# Patient Record
Sex: Male | Born: 1935
Health system: Southern US, Community
[De-identification: ages and names within clinical notes are randomized; demographics above are authoritative.]

## PROBLEM LIST (undated history)

## (undated) DIAGNOSIS — I499 Cardiac arrhythmia, unspecified: Secondary | ICD-10-CM

## (undated) DIAGNOSIS — G4700 Insomnia, unspecified: Secondary | ICD-10-CM

## (undated) DIAGNOSIS — S82899A Other fracture of unspecified lower leg, initial encounter for closed fracture: Secondary | ICD-10-CM

## (undated) DIAGNOSIS — I480 Paroxysmal atrial fibrillation: Secondary | ICD-10-CM

## (undated) DIAGNOSIS — Z8719 Personal history of other diseases of the digestive system: Secondary | ICD-10-CM

## (undated) DIAGNOSIS — N400 Enlarged prostate without lower urinary tract symptoms: Secondary | ICD-10-CM

## (undated) DIAGNOSIS — K219 Gastro-esophageal reflux disease without esophagitis: Secondary | ICD-10-CM

## (undated) DIAGNOSIS — I10 Essential (primary) hypertension: Secondary | ICD-10-CM

## (undated) DIAGNOSIS — N289 Disorder of kidney and ureter, unspecified: Secondary | ICD-10-CM

## (undated) DIAGNOSIS — D649 Anemia, unspecified: Secondary | ICD-10-CM

## (undated) DIAGNOSIS — E039 Hypothyroidism, unspecified: Secondary | ICD-10-CM

## (undated) DIAGNOSIS — E7800 Pure hypercholesterolemia, unspecified: Secondary | ICD-10-CM

## (undated) DIAGNOSIS — M1990 Unspecified osteoarthritis, unspecified site: Secondary | ICD-10-CM

## (undated) DIAGNOSIS — E559 Vitamin D deficiency, unspecified: Secondary | ICD-10-CM

## (undated) DIAGNOSIS — I517 Cardiomegaly: Secondary | ICD-10-CM

## (undated) DIAGNOSIS — R200 Anesthesia of skin: Secondary | ICD-10-CM

## (undated) DIAGNOSIS — I77810 Thoracic aortic ectasia: Secondary | ICD-10-CM

## (undated) DIAGNOSIS — E669 Obesity, unspecified: Secondary | ICD-10-CM

## (undated) DIAGNOSIS — I959 Hypotension, unspecified: Secondary | ICD-10-CM

## (undated) DIAGNOSIS — E785 Hyperlipidemia, unspecified: Secondary | ICD-10-CM

## (undated) DIAGNOSIS — W57XXXA Bitten or stung by nonvenomous insect and other nonvenomous arthropods, initial encounter: Secondary | ICD-10-CM

## (undated) DIAGNOSIS — R001 Bradycardia, unspecified: Secondary | ICD-10-CM

## (undated) DIAGNOSIS — G629 Polyneuropathy, unspecified: Secondary | ICD-10-CM

## (undated) HISTORY — DX: Polyneuropathy, unspecified: G62.9

## (undated) HISTORY — PX: HERNIA REPAIR: SHX51

---

## 1978-04-24 HISTORY — PX: INGUINAL HERNIA REPAIR: SHX194

## 1998-08-06 ENCOUNTER — Emergency Department (HOSPITAL_COMMUNITY): Admission: EM | Admit: 1998-08-06 | Discharge: 1998-08-06 | Payer: Self-pay | Admitting: Emergency Medicine

## 1998-08-06 ENCOUNTER — Encounter: Payer: Self-pay | Admitting: Emergency Medicine

## 1999-11-06 ENCOUNTER — Emergency Department (HOSPITAL_COMMUNITY): Admission: EM | Admit: 1999-11-06 | Discharge: 1999-11-06 | Payer: Self-pay | Admitting: Emergency Medicine

## 2000-07-30 ENCOUNTER — Encounter (HOSPITAL_BASED_OUTPATIENT_CLINIC_OR_DEPARTMENT_OTHER): Payer: Self-pay | Admitting: General Surgery

## 2000-07-30 ENCOUNTER — Encounter (INDEPENDENT_AMBULATORY_CARE_PROVIDER_SITE_OTHER): Payer: Self-pay | Admitting: Specialist

## 2000-07-30 ENCOUNTER — Ambulatory Visit (HOSPITAL_COMMUNITY): Admission: RE | Admit: 2000-07-30 | Discharge: 2000-07-30 | Payer: Self-pay | Admitting: General Surgery

## 2003-03-03 ENCOUNTER — Emergency Department (HOSPITAL_COMMUNITY): Admission: EM | Admit: 2003-03-03 | Discharge: 2003-03-03 | Payer: Self-pay | Admitting: Emergency Medicine

## 2004-03-06 ENCOUNTER — Emergency Department (HOSPITAL_COMMUNITY): Admission: EM | Admit: 2004-03-06 | Discharge: 2004-03-06 | Payer: Self-pay | Admitting: Emergency Medicine

## 2008-11-20 ENCOUNTER — Emergency Department (HOSPITAL_COMMUNITY): Admission: EM | Admit: 2008-11-20 | Discharge: 2008-11-20 | Payer: Self-pay | Admitting: Emergency Medicine

## 2010-09-09 NOTE — Op Note (Signed)
[place]. [facility]  Patient:    Tom Norton, Tom Norton                     MRN: [id number] Proc. Date: 07/30/00 Adm. Date:  [id number] Disc. Date: [id number] Attending:  Sonda Primes                           Operative Report  PREOPERATIVE DIAGNOSIS:  Left inguinal hernia.  POSTOPERATIVE DIAGNOSIS:  Left inguinal hernia.  OPERATION PERFORMED:  Left inguinal herniorrhaphy with mesh.  SURGEON:  Mardene Celeste. Lurene Shadow, M.D.  ASSISTANT:  Magnus Ivan, RN, FA  ANESTHESIA:  MAC using 1% Xylocaine with epinephrine.  INDICATIONS FOR PROCEDURE:  The patient is a 77 year old man with an enlarging and prolapsing left inguinal hernia, who was brought to the operating room now for repair.  DESCRIPTION OF PROCEDURE:  Following the induction of satisfactory sedation with the patient positioned supinely, the left groin was prepped and draped to be included in the sterile operative field.  A transverse incision was made in the lower abdominal crease and deepened through the skin and subcutaneous tissues carried down to the external oblique aponeurosis.  The external oblique aponeurosis was opened up through the external inguinal ring with protection of the ilioinguinal nerve.  Additionally, injections of Xylocaine used to assure anesthesia down against the pubic tubercle.  The spermatic cord was elevated and there was noted to be a very large fatty spermatic cord. Within the cord there was a very large indirect sac that was dissected free from the surrounding tissues, then carried the dissection up to the internal ring.  The sac was opened.  There were no contents within the sac.  The sac was doubly suture ligated at its neck with 2-0 silk sutures.  Redundant sac amputated and forwarded for pathologic evaluation.  Multiple lipomas of the cord were then removed and discarded.  The direct space was then repaired with an onlay patch of Prolene mesh sewn into the pubic  tubercle and carried up along the conjoined tendon to the internal ring and again from the pubic tubercle up along the shelving edge of Pouparts ligament to the internal ring with the mesh being split so as to allow normal protrusion of the cord.  The tails of the mesh were then trimmed and sutured into the internal oblique muscles at the internal ring.  Sponge, instrument and sharp counts were verified.  All areas of dissection were checked for hemostasis and noted to be dry.  The external oblique aponeurosis was closed over the cord with a running suture of 2-0 Vicryl.  Scarpas fascia and the subcutaneous fat closed with a running 3-0 Vicryl and the skin closed with running 4-0 Monocryl, reinforced with Steri-Strips and sterile dressings applied.  Anesthetic reversed. Patient removed from the operating room to the recovery room in stable condition having tolerated the procedure well.   The patient tolerated the procedure well. DD:  07/30/00 TD:  07/30/00 Job: [id number]

## 2011-05-12 DIAGNOSIS — H9190 Unspecified hearing loss, unspecified ear: Secondary | ICD-10-CM | POA: Diagnosis not present

## 2011-05-12 DIAGNOSIS — G4700 Insomnia, unspecified: Secondary | ICD-10-CM | POA: Diagnosis not present

## 2011-05-12 DIAGNOSIS — E782 Mixed hyperlipidemia: Secondary | ICD-10-CM | POA: Diagnosis not present

## 2011-05-12 DIAGNOSIS — I10 Essential (primary) hypertension: Secondary | ICD-10-CM | POA: Diagnosis not present

## 2011-05-12 DIAGNOSIS — Z0000 Encounter for general adult medical examination without abnormal findings: Secondary | ICD-10-CM | POA: Diagnosis not present

## 2011-05-12 DIAGNOSIS — Z79899 Other long term (current) drug therapy: Secondary | ICD-10-CM | POA: Diagnosis not present

## 2011-05-12 DIAGNOSIS — N400 Enlarged prostate without lower urinary tract symptoms: Secondary | ICD-10-CM | POA: Diagnosis not present

## 2011-05-12 DIAGNOSIS — E669 Obesity, unspecified: Secondary | ICD-10-CM | POA: Diagnosis not present

## 2011-06-01 DIAGNOSIS — H903 Sensorineural hearing loss, bilateral: Secondary | ICD-10-CM | POA: Diagnosis not present

## 2011-11-03 DIAGNOSIS — H9190 Unspecified hearing loss, unspecified ear: Secondary | ICD-10-CM | POA: Diagnosis not present

## 2011-11-03 DIAGNOSIS — I10 Essential (primary) hypertension: Secondary | ICD-10-CM | POA: Diagnosis not present

## 2011-11-03 DIAGNOSIS — G4700 Insomnia, unspecified: Secondary | ICD-10-CM | POA: Diagnosis not present

## 2011-11-03 DIAGNOSIS — Z79899 Other long term (current) drug therapy: Secondary | ICD-10-CM | POA: Diagnosis not present

## 2011-11-03 DIAGNOSIS — E669 Obesity, unspecified: Secondary | ICD-10-CM | POA: Diagnosis not present

## 2011-11-03 DIAGNOSIS — E782 Mixed hyperlipidemia: Secondary | ICD-10-CM | POA: Diagnosis not present

## 2011-11-03 DIAGNOSIS — N400 Enlarged prostate without lower urinary tract symptoms: Secondary | ICD-10-CM | POA: Diagnosis not present

## 2011-11-03 DIAGNOSIS — Z1331 Encounter for screening for depression: Secondary | ICD-10-CM | POA: Diagnosis not present

## 2011-12-20 ENCOUNTER — Emergency Department (HOSPITAL_COMMUNITY)
Admission: EM | Admit: 2011-12-20 | Discharge: 2011-12-20 | Disposition: A | Discharge status: Home / Self Care | Payer: Medicare Other | Attending: Emergency Medicine | Admitting: Emergency Medicine

## 2011-12-20 ENCOUNTER — Encounter (HOSPITAL_COMMUNITY): Payer: Self-pay | Admitting: Emergency Medicine

## 2011-12-20 DIAGNOSIS — I10 Essential (primary) hypertension: Secondary | ICD-10-CM | POA: Diagnosis not present

## 2011-12-20 DIAGNOSIS — Z7982 Long term (current) use of aspirin: Secondary | ICD-10-CM | POA: Diagnosis not present

## 2011-12-20 DIAGNOSIS — IMO0002 Reserved for concepts with insufficient information to code with codable children: Secondary | ICD-10-CM | POA: Diagnosis not present

## 2011-12-20 DIAGNOSIS — Z91030 Bee allergy status: Secondary | ICD-10-CM

## 2011-12-20 DIAGNOSIS — T63461A Toxic effect of venom of wasps, accidental (unintentional), initial encounter: Secondary | ICD-10-CM | POA: Insufficient documentation

## 2011-12-20 DIAGNOSIS — T6391XA Toxic effect of contact with unspecified venomous animal, accidental (unintentional), initial encounter: Secondary | ICD-10-CM | POA: Diagnosis not present

## 2011-12-20 HISTORY — DX: Essential (primary) hypertension: I10

## 2011-12-20 MED ORDER — SODIUM CHLORIDE 0.9 % IV BOLUS (SEPSIS)
1000.0000 mL | Freq: Once | INTRAVENOUS | Status: AC
  Administered 2011-12-20: 1000 mL via INTRAVENOUS

## 2011-12-20 MED ORDER — EPINEPHRINE 0.3 MG/0.3ML IJ DEVI: 0.3000 mg | Freq: Once | INTRAMUSCULAR | Status: DC

## 2011-12-20 MED ORDER — PREDNISONE 20 MG PO TABS: 60.0000 mg | ORAL_TABLET | Freq: Every day | ORAL | Status: AC

## 2011-12-20 MED ORDER — METHYLPREDNISOLONE SODIUM SUCC 125 MG IJ SOLR
125.0000 mg | Freq: Once | INTRAMUSCULAR | Status: AC
  Administered 2011-12-20: 125 mg via INTRAVENOUS
  Filled 2011-12-20: qty 2

## 2011-12-20 MED ORDER — DIPHENHYDRAMINE HCL 50 MG/ML IJ SOLN
25.0000 mg | Freq: Once | INTRAMUSCULAR | Status: AC
  Administered 2011-12-20: 25 mg via INTRAVENOUS
  Filled 2011-12-20: qty 1

## 2011-12-20 NOTE — ED Provider Notes (Signed)
I saw and evaluated the patient, reviewed the resident's note and I agree with the findings and plan. Has hx of allergies to flying insects.  Carries epi pen but it is expired.  Stung on right thumb approx. 1 hr pta.  Denies light headedness, sob, n/v/throat tightness or voice change.  asx now.  BP nl.  Op clear without edema.  Lungs cta.   Will monitor and give steroids.  Cheri Guppy, MD 12/20/11 1104

## 2011-12-20 NOTE — ED Provider Notes (Signed)
History     CSN: [id number]  Arrival date & time 12/20/11  0901   First MD Initiated Contact with Patient 12/20/11 [phone]      Chief Complaint  Patient presents with  . Allergic Reaction    HPI 78 yo male with h/o HTN and previous bee sting allergy who presents with bee sting less than 1 hour prior to presentation to ED. Stung on left thumb which was accompanied by light headedness and difficulty breathing. He also felt flushed and warm in his face. He denies any closing of his throat or lip or tongue swelling.  He denies any chest pain, any nausea, vomiting. He denies any itching. He did not have an epi pen on him and drove himself to the hospital. He thinks he was stung by a yellow jacket. He has been in the ED before for bee sting allergic reactions.   Past Medical History  Diagnosis Date  . Hypertension     Past Surgical History  Procedure Date  . Hernia repair     No family history on file.  History  Substance Use Topics  . Smoking status: Never Smoker   . Smokeless tobacco: Not on file  . Alcohol Use: 6.0 oz/week    10 Glasses of wine per week      Review of Systems  All other systems reviewed and are negative.    Allergies  Other  Home Medications   Current Outpatient Rx  Name Route Sig Dispense Refill  . ASPIRIN EC 81 MG PO TBEC Oral Take 81 mg by mouth daily.    Marland Kitchen EPINEPHRINE 0.3 MG/0.3ML IJ DEVI Intramuscular Inject 0.3 mg into the muscle as needed. For allergic reations    . ESZOPICLONE 3 MG PO TABS Oral Take 3 mg by mouth at bedtime. Take immediately before bedtime    . FUROSEMIDE 40 MG PO TABS Oral Take 40 mg by mouth daily as needed. For fluid retention    . ADULT MULTIVITAMIN W/MINERALS CH Oral Take 1 tablet by mouth daily.    Marland Kitchen SIMVASTATIN 40 MG PO TABS Oral Take 40 mg by mouth daily.    Marland Kitchen TAMSULOSIN HCL 0.4 MG PO CAPS Oral Take 0.4 mg by mouth daily after supper.    Marland Kitchen VALSARTAN-HYDROCHLOROTHIAZIDE 160-12.5 MG PO TABS Oral Take 1 tablet by mouth  daily.    Marland Kitchen EPINEPHRINE 0.3 MG/0.3ML IJ DEVI Intramuscular Inject 0.3 mLs (0.3 mg total) into the muscle once. 1 Device 0  . PREDNISONE 20 MG PO TABS Oral Take 3 tablets (60 mg total) by mouth daily. Take 3 pills (total 60mg ) for 4 days starting tomorrow 12 tablet 0    BP 112/73  Pulse 59  Temp 97.7 F (36.5 C) (Oral)  Resp 16  SpO2 93%  Physical Exam  Constitutional: He is oriented to person, place, and time. No distress.  HENT:  Head: Normocephalic and atraumatic.       Oropharynx clear. No tongue or lip swelling  Eyes: Conjunctivae and EOM are normal. Pupils are equal, round, and reactive to light.  Cardiovascular: Normal rate, regular rhythm and normal heart sounds.   Pulmonary/Chest: Effort normal and breath sounds normal. No respiratory distress. He has no wheezes.  Abdominal: Soft. He exhibits no distension. There is no tenderness.  Musculoskeletal:       Edema around right thumb  Neurological: He is alert and oriented to person, place, and time.  Skin:       Flushed face.  Edema around  right thumb with small puncture site    ED Course  Procedures (including critical care time)  Labs Reviewed - No data to display No results found.   1. Bee sting allergy       MDM  Required 2L O2 for desats to low 90's. Vital signs stable with normal blood pressure, no wheezing or respiratory distress on exam. Solumedrol 125mg  IV and benadryl 25mg  given Received 2L bolus.  Patient feeling better. Vital signs stable, O2 sats at 95% on room air.  Patient discharged home with 4 day course of prednisone 60mg  daily as well as Rx for epi-pen.        Lonia Skinner, MD 12/20/11 1536

## 2011-12-20 NOTE — ED Notes (Signed)
Pt stated that he was opening a gate and did not realize that the bee was there and the bee stung him on his right thumb. Pt stated that he is allergic to bee's and did not have an epi pen with him at the time. Pt stated that he is a little short of breath. Stated that his face "feels funny".  Pt right thumb has some swelling.

## 2011-12-24 NOTE — ED Provider Notes (Signed)
I saw and evaluated the patient, reviewed the resident's note and I agree with the findings and plan.  Kerriann Kamphuis, MD 12/24/11 0750 

## 2012-01-09 DIAGNOSIS — Z23 Encounter for immunization: Secondary | ICD-10-CM | POA: Diagnosis not present

## 2012-05-16 DIAGNOSIS — Z79899 Other long term (current) drug therapy: Secondary | ICD-10-CM | POA: Diagnosis not present

## 2012-05-16 DIAGNOSIS — N400 Enlarged prostate without lower urinary tract symptoms: Secondary | ICD-10-CM | POA: Diagnosis not present

## 2012-05-16 DIAGNOSIS — Z1331 Encounter for screening for depression: Secondary | ICD-10-CM | POA: Diagnosis not present

## 2012-05-16 DIAGNOSIS — E782 Mixed hyperlipidemia: Secondary | ICD-10-CM | POA: Diagnosis not present

## 2012-05-16 DIAGNOSIS — G4700 Insomnia, unspecified: Secondary | ICD-10-CM | POA: Diagnosis not present

## 2012-05-16 DIAGNOSIS — Z0000 Encounter for general adult medical examination without abnormal findings: Secondary | ICD-10-CM | POA: Diagnosis not present

## 2012-05-16 DIAGNOSIS — E559 Vitamin D deficiency, unspecified: Secondary | ICD-10-CM | POA: Diagnosis not present

## 2012-05-16 DIAGNOSIS — E669 Obesity, unspecified: Secondary | ICD-10-CM | POA: Diagnosis not present

## 2012-05-16 DIAGNOSIS — I10 Essential (primary) hypertension: Secondary | ICD-10-CM | POA: Diagnosis not present

## 2012-09-08 ENCOUNTER — Encounter (HOSPITAL_COMMUNITY): Payer: Self-pay | Admitting: *Deleted

## 2012-09-08 ENCOUNTER — Emergency Department (HOSPITAL_COMMUNITY)
Admission: EM | Admit: 2012-09-08 | Discharge: 2012-09-08 | Disposition: A | Discharge status: Home / Self Care | Payer: Medicare Other | Attending: Emergency Medicine | Admitting: Emergency Medicine

## 2012-09-08 DIAGNOSIS — T63461A Toxic effect of venom of wasps, accidental (unintentional), initial encounter: Secondary | ICD-10-CM | POA: Insufficient documentation

## 2012-09-08 DIAGNOSIS — T6391XA Toxic effect of contact with unspecified venomous animal, accidental (unintentional), initial encounter: Secondary | ICD-10-CM | POA: Insufficient documentation

## 2012-09-08 DIAGNOSIS — E7800 Pure hypercholesterolemia, unspecified: Secondary | ICD-10-CM | POA: Insufficient documentation

## 2012-09-08 DIAGNOSIS — Z7982 Long term (current) use of aspirin: Secondary | ICD-10-CM | POA: Diagnosis not present

## 2012-09-08 DIAGNOSIS — Z79899 Other long term (current) drug therapy: Secondary | ICD-10-CM | POA: Diagnosis not present

## 2012-09-08 DIAGNOSIS — Y939 Activity, unspecified: Secondary | ICD-10-CM | POA: Insufficient documentation

## 2012-09-08 DIAGNOSIS — I10 Essential (primary) hypertension: Secondary | ICD-10-CM | POA: Insufficient documentation

## 2012-09-08 DIAGNOSIS — Y929 Unspecified place or not applicable: Secondary | ICD-10-CM | POA: Insufficient documentation

## 2012-09-08 HISTORY — DX: Pure hypercholesterolemia, unspecified: E78.00

## 2012-09-08 MED ORDER — DEXAMETHASONE 6 MG PO TABS
10.0000 mg | ORAL_TABLET | Freq: Once | ORAL | Status: AC
  Administered 2012-09-08: 10 mg via ORAL
  Filled 2012-09-08: qty 1

## 2012-09-08 MED ORDER — DEXAMETHASONE 4 MG PO TABS
10.0000 mg | ORAL_TABLET | Freq: Once | ORAL | Status: DC
Start: 2012-09-08 — End: 2012-09-08
  Filled 2012-09-08: qty 1

## 2012-09-08 MED ORDER — RANITIDINE HCL 150 MG/10ML PO SYRP
150.0000 mg | ORAL_SOLUTION | Freq: Once | ORAL | Status: DC
  Filled 2012-09-08: qty 10

## 2012-09-08 MED ORDER — EPINEPHRINE 0.3 MG/0.3ML IJ SOAJ: 0.3000 mg | Freq: Once | INTRAMUSCULAR | Status: DC

## 2012-09-08 MED ORDER — RANITIDINE HCL 150 MG/10ML PO SYRP: 150.0000 mg | ORAL_SOLUTION | Freq: Once | ORAL | Status: DC

## 2012-09-08 MED ORDER — FAMOTIDINE 40 MG/5ML PO SUSR
20.0000 mg | Freq: Once | ORAL | Status: AC
  Administered 2012-09-08: 20 mg via ORAL
  Filled 2012-09-08: qty 2.5

## 2012-09-08 NOTE — ED Notes (Signed)
Pt requesting information about d/c. Pt states he is ready for d/c now.

## 2012-09-08 NOTE — ED Provider Notes (Signed)
I saw and evaluated the patient, reviewed the resident's note and I agree with the findings and plan.  Patient with prior history of severe allergies to bee stings, was stung today at around 5:00, had some tingling and some mild shortness of breath. He went to a fire department which was close to his home, they administered an EpiPen. This likely occurred around 5:30. Currently at 8:00, the patient continues to feel at his baseline, no shortness of breath, lightheadedness dizziness, nausea or vomiting. Patient reports he thinks all of the existing EpiPen that he has at home or at least a-year-old and likely other day. Plan is to give him a refill of his EpiPen and he can followup as usual with his primary care physician, certainly return for any worsening symptoms, chest pain, shortness of breath.   Impression: Allergic reaction to insect sting  Tom Norton. Oletta Lamas, MD 09/08/12 2012

## 2012-09-08 NOTE — ED Provider Notes (Signed)
History     CSN: [id number]  Arrival date & time 09/08/12  1748   First MD Initiated Contact with Patient 09/08/12 1808      Chief Complaint  Patient presents with  . Allergic Reaction    (Consider location/radiation/quality/duration/timing/severity/associated sxs/prior treatment) HPI Comments: 79 y.o. male w/ pmh of anaphylaxis rxn to bee stings was stung by bee at approx 5:45. Was working in yard when this happened. Had shortness of breath and dizziness after this, went to fire station, and they administered pt's epi pen. Pt's symptoms resolved. Pt states he is having "a little shortness of breath". Does not feel throat is closing, no chest pain. No n/v.   Patient is a 79 y.o. male presenting with general illness. The history is provided by the patient.  Illness  The current episode started today. The onset was sudden. The problem occurs rarely. The problem has been resolved. The problem is moderate. Relieved by: epi pen. Nothing aggravates the symptoms. Pertinent negatives include no fever, no abdominal pain, no constipation, no diarrhea, no nausea, no vomiting, no headaches, no mouth sores, no neck pain, no wheezing, no rash, no eye discharge and no eye pain.    Past Medical History  Diagnosis Date  . Hypertension   . High cholesterol     Past Surgical History  Procedure Laterality Date  . Hernia repair      History reviewed. No pertinent family history.  History  Substance Use Topics  . Smoking status: Never Smoker   . Smokeless tobacco: Not on file  . Alcohol Use: 6.0 oz/week    10 Glasses of wine per week      Review of Systems  Constitutional: Negative for fever, chills and activity change.  HENT: Negative for drooling, mouth sores and neck pain.   Eyes: Negative for pain and discharge.  Respiratory: Negative for chest tightness and wheezing.   Gastrointestinal: Negative for nausea, vomiting, abdominal pain, diarrhea and constipation.  Genitourinary:  Negative for dysuria, flank pain and difficulty urinating.  Musculoskeletal: Negative for back pain and arthralgias.  Skin: Negative for color change, pallor and rash.  Neurological: Negative for dizziness, weakness, light-headedness and headaches.  Psychiatric/Behavioral: Negative for behavioral problems, confusion and agitation.    Allergies  Other  Home Medications   Current Outpatient Rx  Name  Route  Sig  Dispense  Refill  . aspirin EC 81 MG tablet   Oral   Take 81 mg by mouth daily.         Marland Kitchen EPINEPHrine (EPI-PEN) 0.3 mg/0.3 mL DEVI   Intramuscular   Inject 0.3 mg into the muscle as needed. For allergic reations         . Eszopiclone 3 MG TABS   Oral   Take 3 mg by mouth at bedtime. Take immediately before bedtime         . furosemide (LASIX) 40 MG tablet   Oral   Take 40 mg by mouth daily as needed. For fluid retention         . Multiple Vitamin (MULTIVITAMIN WITH MINERALS) TABS   Oral   Take 1 tablet by mouth daily.         . simvastatin (ZOCOR) 40 MG tablet   Oral   Take 40 mg by mouth daily.         . Tamsulosin HCl (FLOMAX) 0.4 MG CAPS   Oral   Take 0.4 mg by mouth daily after supper.         Marland Kitchen  valsartan-hydrochlorothiazide (DIOVAN-HCT) 160-12.5 MG per tablet   Oral   Take 1 tablet by mouth daily.           BP 112/80  Pulse 76  Temp(Src) 97.7 F (36.5 C) (Oral)  Resp 20  SpO2 96%  Physical Exam  Constitutional: He is oriented to person, place, and time. He appears well-developed. No distress.  HENT:  Head: Normocephalic.  Eyes: Pupils are equal, round, and reactive to light. Right eye exhibits no discharge. Left eye exhibits no discharge.  Neck: Neck supple. No tracheal deviation present.  Cardiovascular: Regular rhythm and intact distal pulses.   Pulmonary/Chest: Effort normal. No stridor. No respiratory distress. He has no wheezes.  No stridor no wheezing present   Abdominal: Soft. He exhibits no distension. There is no  tenderness. There is no rebound.  Musculoskeletal: Normal range of motion. He exhibits no tenderness.  Neurological: He is alert and oriented to person, place, and time. No cranial nerve deficit.  Skin: Skin is warm. No rash noted. He is not diaphoretic. No erythema.    ED Course  Procedures (including critical care time)  Labs Reviewed - No data to display No results found.    MDM  Pt was given epi pen at fire department after being stung by bee. No stridor or wheezing. No acute airway issues. His symptoms have markedly resolved and he states, "I feel pretty good". He has already received benadryl 50mg . Will give zantac PO, and will give decadron to prevent rebound. Will monitor in the Er for two more hours. No further labs or tx are indicated at this time.   After 3 hours post having received epi, pt is doing well. No recurrence of symptoms. No shortness of breath or wheezing.   Pt is discharged w/ epi pen prescription.   1. Allergic reaction to bee sting, initial encounter           Bernadene Person, MD 09/08/12 2215

## 2012-09-08 NOTE — ED Notes (Signed)
Pt reports being allergic to bees, was stung this afternoon, became sob. Pt took benadryl 50mg  po and used epi pen at 1715. Pts only complaint is mild sob at this time, airway intact, spo2 95%.

## 2012-10-10 DIAGNOSIS — L255 Unspecified contact dermatitis due to plants, except food: Secondary | ICD-10-CM | POA: Diagnosis not present

## 2012-11-01 DIAGNOSIS — H2510 Age-related nuclear cataract, unspecified eye: Secondary | ICD-10-CM | POA: Diagnosis not present

## 2012-11-19 DIAGNOSIS — E039 Hypothyroidism, unspecified: Secondary | ICD-10-CM | POA: Diagnosis not present

## 2013-01-17 DIAGNOSIS — E039 Hypothyroidism, unspecified: Secondary | ICD-10-CM | POA: Diagnosis not present

## 2013-01-17 DIAGNOSIS — Z23 Encounter for immunization: Secondary | ICD-10-CM | POA: Diagnosis not present

## 2013-01-17 DIAGNOSIS — R7309 Other abnormal glucose: Secondary | ICD-10-CM | POA: Diagnosis not present

## 2013-04-24 HISTORY — PX: CARDIOVERSION: SHX1299

## 2013-06-02 DIAGNOSIS — E7800 Pure hypercholesterolemia, unspecified: Secondary | ICD-10-CM | POA: Diagnosis not present

## 2013-06-02 DIAGNOSIS — I10 Essential (primary) hypertension: Secondary | ICD-10-CM | POA: Diagnosis not present

## 2013-06-02 DIAGNOSIS — R7309 Other abnormal glucose: Secondary | ICD-10-CM | POA: Diagnosis not present

## 2013-06-02 DIAGNOSIS — E039 Hypothyroidism, unspecified: Secondary | ICD-10-CM | POA: Diagnosis not present

## 2013-06-02 DIAGNOSIS — N400 Enlarged prostate without lower urinary tract symptoms: Secondary | ICD-10-CM | POA: Diagnosis not present

## 2013-06-02 DIAGNOSIS — Z1331 Encounter for screening for depression: Secondary | ICD-10-CM | POA: Diagnosis not present

## 2013-06-02 DIAGNOSIS — G4700 Insomnia, unspecified: Secondary | ICD-10-CM | POA: Diagnosis not present

## 2013-06-02 DIAGNOSIS — E559 Vitamin D deficiency, unspecified: Secondary | ICD-10-CM | POA: Diagnosis not present

## 2013-06-02 DIAGNOSIS — Z0000 Encounter for general adult medical examination without abnormal findings: Secondary | ICD-10-CM | POA: Diagnosis not present

## 2013-09-22 DIAGNOSIS — T148 Other injury of unspecified body region: Secondary | ICD-10-CM | POA: Diagnosis not present

## 2013-09-23 ENCOUNTER — Inpatient Hospital Stay (HOSPITAL_COMMUNITY)
Admission: EM | Admit: 2013-09-23 | Discharge: 2013-09-24 | DRG: 310 | Disposition: A | Discharge status: Home / Self Care | Payer: Medicare Other | Attending: Cardiovascular Disease | Admitting: Cardiovascular Disease

## 2013-09-23 ENCOUNTER — Encounter (HOSPITAL_COMMUNITY): Payer: Self-pay | Admitting: Emergency Medicine

## 2013-09-23 ENCOUNTER — Emergency Department (HOSPITAL_COMMUNITY): Payer: Medicare Other

## 2013-09-23 DIAGNOSIS — E860 Dehydration: Secondary | ICD-10-CM | POA: Diagnosis present

## 2013-09-23 DIAGNOSIS — E861 Hypovolemia: Secondary | ICD-10-CM | POA: Diagnosis present

## 2013-09-23 DIAGNOSIS — Z7982 Long term (current) use of aspirin: Secondary | ICD-10-CM | POA: Diagnosis not present

## 2013-09-23 DIAGNOSIS — E7800 Pure hypercholesterolemia, unspecified: Secondary | ICD-10-CM | POA: Diagnosis present

## 2013-09-23 DIAGNOSIS — I359 Nonrheumatic aortic valve disorder, unspecified: Secondary | ICD-10-CM

## 2013-09-23 DIAGNOSIS — I959 Hypotension, unspecified: Secondary | ICD-10-CM | POA: Diagnosis present

## 2013-09-23 DIAGNOSIS — R0602 Shortness of breath: Secondary | ICD-10-CM | POA: Diagnosis not present

## 2013-09-23 DIAGNOSIS — R5383 Other fatigue: Secondary | ICD-10-CM | POA: Diagnosis not present

## 2013-09-23 DIAGNOSIS — N289 Disorder of kidney and ureter, unspecified: Secondary | ICD-10-CM | POA: Diagnosis present

## 2013-09-23 DIAGNOSIS — I4891 Unspecified atrial fibrillation: Principal | ICD-10-CM

## 2013-09-23 DIAGNOSIS — I10 Essential (primary) hypertension: Secondary | ICD-10-CM | POA: Diagnosis present

## 2013-09-23 DIAGNOSIS — T502X5A Adverse effect of carbonic-anhydrase inhibitors, benzothiadiazides and other diuretics, initial encounter: Secondary | ICD-10-CM | POA: Diagnosis present

## 2013-09-23 DIAGNOSIS — R5381 Other malaise: Secondary | ICD-10-CM | POA: Diagnosis not present

## 2013-09-23 DIAGNOSIS — E039 Hypothyroidism, unspecified: Secondary | ICD-10-CM | POA: Diagnosis present

## 2013-09-23 DIAGNOSIS — E785 Hyperlipidemia, unspecified: Secondary | ICD-10-CM | POA: Diagnosis present

## 2013-09-23 DIAGNOSIS — I77810 Thoracic aortic ectasia: Secondary | ICD-10-CM | POA: Diagnosis present

## 2013-09-23 DIAGNOSIS — I498 Other specified cardiac arrhythmias: Secondary | ICD-10-CM | POA: Diagnosis present

## 2013-09-23 DIAGNOSIS — I480 Paroxysmal atrial fibrillation: Secondary | ICD-10-CM | POA: Diagnosis present

## 2013-09-23 HISTORY — DX: Bradycardia, unspecified: R00.1

## 2013-09-23 HISTORY — PX: TRANSTHORACIC ECHOCARDIOGRAM: SHX275

## 2013-09-23 HISTORY — DX: Thoracic aortic ectasia: I77.810

## 2013-09-23 HISTORY — DX: Hypothyroidism, unspecified: E03.9

## 2013-09-23 HISTORY — DX: Hypotension, unspecified: I95.9

## 2013-09-23 HISTORY — DX: Paroxysmal atrial fibrillation: I48.0

## 2013-09-23 HISTORY — DX: Obesity, unspecified: E66.9

## 2013-09-23 HISTORY — DX: Disorder of kidney and ureter, unspecified: N28.9

## 2013-09-23 LAB — COMPREHENSIVE METABOLIC PANEL
ALT: 16 U/L (ref 0–53)
AST: 29 U/L (ref 0–37)
Albumin: 3 g/dL — ABNORMAL LOW (ref 3.5–5.2)
Alkaline Phosphatase: 57 U/L (ref 39–117)
BILIRUBIN TOTAL: 0.4 mg/dL (ref 0.3–1.2)
BUN: 15 mg/dL (ref 6–23)
CALCIUM: 9 mg/dL (ref 8.4–10.5)
CO2: 23 meq/L (ref 19–32)
Chloride: 108 mEq/L (ref 96–112)
Creatinine, Ser: 1.04 mg/dL (ref 0.50–1.35)
GFR calc non Af Amer: 65 mL/min — ABNORMAL LOW (ref >90)
GFR, EST AFRICAN AMERICAN: 75 mL/min — AB (ref >90)
Glucose, Bld: 92 mg/dL (ref 70–99)
Potassium: 5.1 mEq/L (ref 3.7–5.3)
Sodium: 140 mEq/L (ref 137–147)
Total Protein: 5.4 g/dL — ABNORMAL LOW (ref 6.0–8.3)

## 2013-09-23 LAB — BASIC METABOLIC PANEL
BUN: 15 mg/dL (ref 6–23)
CALCIUM: 10 mg/dL (ref 8.4–10.5)
CO2: 22 meq/L (ref 19–32)
CREATININE: 1.38 mg/dL — AB (ref 0.50–1.35)
Chloride: 103 mEq/L (ref 96–112)
GFR calc non Af Amer: 46 mL/min — ABNORMAL LOW (ref >90)
GFR, EST AFRICAN AMERICAN: 53 mL/min — AB (ref >90)
Glucose, Bld: 134 mg/dL — ABNORMAL HIGH (ref 70–99)
Potassium: 4.3 mEq/L (ref 3.7–5.3)
Sodium: 139 mEq/L (ref 137–147)

## 2013-09-23 LAB — I-STAT TROPONIN, ED: TROPONIN I, POC: 0 ng/mL (ref 0.00–0.08)

## 2013-09-23 LAB — CBC
HCT: 46.4 % (ref 39.0–52.0)
HEMATOCRIT: 40 % (ref 39.0–52.0)
Hemoglobin: 16.1 g/dL (ref 13.0–17.0)
Hemoglobin: 13.6 g/dL (ref 13.0–17.0)
MCH: 32.1 pg (ref 26.0–34.0)
MCH: 31.9 pg (ref 26.0–34.0)
MCHC: 34.7 g/dL (ref 30.0–36.0)
MCHC: 34 g/dL (ref 30.0–36.0)
MCV: 92.4 fL (ref 78.0–100.0)
MCV: 93.7 fL (ref 78.0–100.0)
PLATELETS: 211 10*3/uL (ref 150–400)
Platelets: 151 10*3/uL (ref 150–400)
RBC: 5.02 MIL/uL (ref 4.22–5.81)
RBC: 4.27 MIL/uL (ref 4.22–5.81)
RDW: 12.9 % (ref 11.5–15.5)
RDW: 13.1 % (ref 11.5–15.5)
WBC: 5.8 10*3/uL (ref 4.0–10.5)
WBC: 4.7 10*3/uL (ref 4.0–10.5)

## 2013-09-23 LAB — T4, FREE: FREE T4: 0.98 ng/dL (ref 0.80–1.80)

## 2013-09-23 LAB — HEMOGLOBIN A1C
Hgb A1c MFr Bld: 5.5 % (ref <5.7)
Mean Plasma Glucose: 111 mg/dL (ref <117)

## 2013-09-23 LAB — TSH: TSH: 6.98 u[IU]/mL — ABNORMAL HIGH (ref 0.350–4.500)

## 2013-09-23 LAB — MRSA PCR SCREENING: MRSA by PCR: NEGATIVE

## 2013-09-23 MED ORDER — ENOXAPARIN SODIUM 40 MG/0.4ML ~~LOC~~ SOLN
40.0000 mg | SUBCUTANEOUS | Status: DC
  Administered 2013-09-23 – 2013-09-24 (×2): 40 mg via SUBCUTANEOUS
  Filled 2013-09-23 (×2): qty 0.4

## 2013-09-23 MED ORDER — IRBESARTAN 150 MG PO TABS
150.0000 mg | ORAL_TABLET | Freq: Every day | ORAL | Status: DC
  Filled 2013-09-23: qty 1

## 2013-09-23 MED ORDER — WARFARIN SODIUM 7.5 MG PO TABS
7.5000 mg | ORAL_TABLET | Freq: Once | ORAL | Status: AC
  Administered 2013-09-23: 7.5 mg via ORAL
  Filled 2013-09-23: qty 1

## 2013-09-23 MED ORDER — MUPIROCIN 2 % EX OINT: 1.0000 "application " | TOPICAL_OINTMENT | Freq: Two times a day (BID) | CUTANEOUS | Status: DC | PRN

## 2013-09-23 MED ORDER — LEVOTHYROXINE SODIUM 25 MCG PO TABS
25.0000 ug | ORAL_TABLET | Freq: Every day | ORAL | Status: DC
  Administered 2013-09-24: 25 ug via ORAL
  Filled 2013-09-23 (×2): qty 1

## 2013-09-23 MED ORDER — SODIUM CHLORIDE 0.9 % IV BOLUS (SEPSIS)
1000.0000 mL | Freq: Once | INTRAVENOUS | Status: AC
  Administered 2013-09-23: 1000 mL via INTRAVENOUS

## 2013-09-23 MED ORDER — BIOTENE DRY MOUTH MT LIQD
15.0000 mL | Freq: Two times a day (BID) | OROMUCOSAL | Status: DC
  Administered 2013-09-23 – 2013-09-24 (×2): 15 mL via OROMUCOSAL

## 2013-09-23 MED ORDER — ASPIRIN EC 81 MG PO TBEC
81.0000 mg | DELAYED_RELEASE_TABLET | Freq: Every day | ORAL | Status: DC
  Administered 2013-09-23 – 2013-09-24 (×2): 81 mg via ORAL
  Filled 2013-09-23 (×3): qty 1

## 2013-09-23 MED ORDER — WARFARIN - PHARMACIST DOSING INPATIENT: Freq: Every day | Status: DC

## 2013-09-23 MED ORDER — VALSARTAN-HYDROCHLOROTHIAZIDE 160-12.5 MG PO TABS: 1.0000 | ORAL_TABLET | Freq: Every day | ORAL | Status: DC

## 2013-09-23 MED ORDER — ADULT MULTIVITAMIN W/MINERALS CH
1.0000 | ORAL_TABLET | Freq: Every day | ORAL | Status: DC
  Administered 2013-09-23 – 2013-09-24 (×2): 1 via ORAL
  Filled 2013-09-23 (×2): qty 1

## 2013-09-23 MED ORDER — MIDAZOLAM HCL 2 MG/2ML IJ SOLN
INTRAMUSCULAR | Status: AC
  Administered 2013-09-23: 2 mg
  Filled 2013-09-23: qty 2

## 2013-09-23 MED ORDER — DOXYCYCLINE HYCLATE 100 MG PO TABS
100.0000 mg | ORAL_TABLET | Freq: Two times a day (BID) | ORAL | Status: DC
  Administered 2013-09-23 – 2013-09-24 (×2): 100 mg via ORAL
  Filled 2013-09-23 (×4): qty 1

## 2013-09-23 MED ORDER — SIMVASTATIN 40 MG PO TABS
40.0000 mg | ORAL_TABLET | Freq: Every day | ORAL | Status: DC
  Administered 2013-09-23 – 2013-09-24 (×2): 40 mg via ORAL
  Filled 2013-09-23 (×2): qty 1

## 2013-09-23 MED ORDER — TAMSULOSIN HCL 0.4 MG PO CAPS
0.4000 mg | ORAL_CAPSULE | Freq: Every day | ORAL | Status: DC
  Administered 2013-09-23 – 2013-09-24 (×2): 0.4 mg via ORAL
  Filled 2013-09-23 (×2): qty 1

## 2013-09-23 MED ORDER — DIPHENHYDRAMINE HCL 25 MG PO CAPS
50.0000 mg | ORAL_CAPSULE | Freq: Every day | ORAL | Status: DC | PRN
  Administered 2013-09-23: 50 mg via ORAL
  Filled 2013-09-23: qty 2

## 2013-09-23 MED ORDER — SODIUM CHLORIDE 0.9 % IV BOLUS (SEPSIS): 1000.0000 mL | Freq: Once | INTRAVENOUS | Status: DC

## 2013-09-23 MED ORDER — HYDROCHLOROTHIAZIDE 12.5 MG PO CAPS
12.5000 mg | ORAL_CAPSULE | Freq: Every day | ORAL | Status: DC
  Filled 2013-09-23: qty 1

## 2013-09-23 MED ORDER — EPINEPHRINE 0.3 MG/0.3ML IJ SOAJ
0.3000 mg | Freq: Every day | INTRAMUSCULAR | Status: DC | PRN
  Filled 2013-09-23: qty 0.6

## 2013-09-23 NOTE — Progress Notes (Signed)
  Echocardiogram 2D Echocardiogram has been performed.  Basilia Jumbo 09/23/2013, 2:57 PM

## 2013-09-23 NOTE — ED Provider Notes (Signed)
CSN: [id number]     Arrival date & time 09/23/13  0440 History   First MD Initiated Contact with Patient 09/23/13 0531     Chief Complaint  Patient presents with  . Shortness of Breath  . Irregular Heart Beat     (Consider location/radiation/quality/duration/timing/severity/associated sxs/prior Treatment) HPI 80 year old male presents emergency apartment from home with complaint of shortness of breath and palpitations.  Patient started doxycycline yesterday after having two tick bites last week.  Patient was concerned that he had an allergic reaction, so he took a dose of Benadryl.  No improvement in symptoms.  His wife took his pulse and as she has a history of atrial fibrillation herself, thought that he was in an irregular rhythm.  He denies any chest pain.  He reports feeling dizzy. Past Medical History  Diagnosis Date  . Hypertension   . High cholesterol    Past Surgical History  Procedure Laterality Date  . Hernia repair     No family history on file. History  Substance Use Topics  . Smoking status: Never Smoker   . Smokeless tobacco: Not on file  . Alcohol Use: 6.0 oz/week    10 Glasses of wine per week    Review of Systems  See History of Present Illness; otherwise all other systems are reviewed and negative   Allergies  Other  Home Medications   Prior to Admission medications   Medication Sig Start Date End Date Taking? Authorizing Provider  aspirin EC 81 MG tablet Take 81 mg by mouth daily.   Yes Historical Provider, MD  Cholecalciferol (VITAMIN D) 2000 UNITS CAPS Take 2,000 Units by mouth daily.   Yes Historical Provider, MD  Coenzyme Q10 (CO Q 10) 10 MG CAPS Take 10 mg by mouth daily.   Yes Historical Provider, MD  diphenhydrAMINE (BENADRYL) 25 mg capsule Take 50 mg by mouth daily as needed for allergies.   Yes Historical Provider, MD  doxycycline (VIBRA-TABS) 100 MG tablet Take 100 mg by mouth 2 (two) times daily.   Yes Historical Provider, MD  EPINEPHrine  0.3 mg/0.3 mL IJ SOAJ injection Inject 0.3 mg into the muscle daily as needed (allergic reaction).   Yes Historical Provider, MD  Eszopiclone 3 MG TABS Take 3 mg by mouth at bedtime. Take immediately before bedtime   Yes Historical Provider, MD  furosemide (LASIX) 40 MG tablet Take 40 mg by mouth daily as needed. For fluid retention   Yes Historical Provider, MD  levothyroxine (SYNTHROID, LEVOTHROID) 25 MCG tablet Take 25 mcg by mouth daily before breakfast.   Yes Historical Provider, MD  Multiple Vitamin (MULTIVITAMIN WITH MINERALS) TABS Take 1 tablet by mouth daily.   Yes Historical Provider, MD  mupirocin ointment (BACTROBAN) 2 % Apply 1 application topically 2 (two) times daily as needed (for tick bite).   Yes Historical Provider, MD  simvastatin (ZOCOR) 40 MG tablet Take 40 mg by mouth daily.   Yes Historical Provider, MD  Tamsulosin HCl (FLOMAX) 0.4 MG CAPS Take 0.4 mg by mouth daily after supper.   Yes Historical Provider, MD  valsartan-hydrochlorothiazide (DIOVAN-HCT) 160-12.5 MG per tablet Take 1 tablet by mouth daily.   Yes Historical Provider, MD   BP 87/67  Pulse 74  Temp(Src) 98.2 F (36.8 C) (Oral)  Resp 9  Ht 6' (1.829 m)  Wt 220 lb (99.791 kg)  BMI 29.83 kg/m2  SpO2 99% Physical Exam  Nursing note and vitals reviewed. Constitutional: He is oriented to person, place, and time.  He appears well-developed and well-nourished. He appears distressed (pale, uncomfortable).  HENT:  Head: Normocephalic and atraumatic.  Nose: Nose normal.  Mouth/Throat: Oropharynx is clear and moist.  Eyes: Conjunctivae and EOM are normal. Pupils are equal, round, and reactive to light.  Neck: Normal range of motion. Neck supple. No JVD present. No tracheal deviation present. No thyromegaly present.  Cardiovascular: Normal heart sounds and intact distal pulses.  Exam reveals no gallop and no friction rub.   No murmur heard. Tachycardia, irregular rate and rhythm  Pulmonary/Chest: Effort normal and  breath sounds normal. No stridor. No respiratory distress. He has no wheezes. He has no rales. He exhibits no tenderness.  Abdominal: Soft. Bowel sounds are normal. He exhibits no distension and no mass. There is no tenderness. There is no rebound and no guarding.  Musculoskeletal: Normal range of motion. He exhibits no edema and no tenderness.  Lymphadenopathy:    He has no cervical adenopathy.  Neurological: He is alert and oriented to person, place, and time. He exhibits normal muscle tone. Coordination normal.  Skin: Skin is warm and dry. No rash noted. No erythema. No pallor.  Psychiatric: He has a normal mood and affect. His behavior is normal. Judgment and thought content normal.    ED Course  CARDIOVERSION Date/Time: 09/23/2013 6:19 AM Performed by: Kalman Drape Authorized by: Kalman Drape Consent: Verbal consent obtained. The procedure was performed in an emergent situation. Risks and benefits: risks, benefits and alternatives were discussed Consent given by: patient Patient understanding: patient states understanding of the procedure being performed Required items: required blood products, implants, devices, and special equipment available Patient identity confirmed: verbally with patient and arm band Patient sedated: yes Sedation type: anxiolysis Sedatives: midazolam Sedation start date/time: 09/23/2013 5:07 AM Sedation end date/time: 09/23/2013 5:20 AM Vitals: Vital signs were monitored during sedation. Cardioversion basis: emergent Pre-procedure rhythm: atrial fibrillation Patient position: patient was placed in a supine position Chest area: chest area exposed Electrodes: pads Electrodes placed: anterior-posterior Number of attempts: 1 Attempt 1 mode: synchronous Attempt 1 waveform: biphasic Attempt 1 shock (in Joules): 120 Attempt 1 outcome: conversion to normal sinus rhythm Post-procedure rhythm: normal sinus rhythm Complications: no complications Patient tolerance:  Patient tolerated the procedure well with no immediate complications.   (including critical care time)  CRITICAL CARE Performed by: Kalman Drape Total critical care time: 60 min Critical care time was exclusive of separately billable procedures and treating other patients. Critical care was necessary to treat or prevent imminent or life-threatening deterioration. Critical care was time spent personally by me on the following activities: development of treatment plan with patient and/or surrogate as well as nursing, discussions with consultants, evaluation of patient's response to treatment, examination of patient, obtaining history from patient or surrogate, ordering and performing treatments and interventions, ordering and review of laboratory studies, ordering and review of radiographic studies, pulse oximetry and re-evaluation of patient's condition.  Labs Review Labs Reviewed  BASIC METABOLIC PANEL - Abnormal; Notable for the following:    Glucose, Bld 134 (*)    Creatinine, Ser 1.38 (*)    GFR calc non Af Amer 46 (*)    GFR calc Af Amer 53 (*)    All other components within normal limits  CBC  I-STAT TROPOININ, ED    Imaging Review No results found.   EKG Interpretation   Date/Time:  Tuesday September 23 2013 05:11:47 EDT Ventricular Rate:  79 PR Interval:  180 QRS Duration: 109 QT Interval:  359 QTC Calculation: 411 R Axis:   -23 Text Interpretation:  Sinus rhythm Borderline left axis deviation Low  voltage, precordial leads Baseline wander in lead(s) V6 Confirmed by Tremaine Fuhriman   MD, Collins Dimaria ([id number]) on 09/23/2013 5:36:49 AM      MDM   Final diagnoses:  Atrial fibrillation with rapid ventricular response  Atrial fibrillation status post cardioversion    80 year old male with A. fib with RVR with unstable blood pressure.  Initial blood pressure 86/43, seemed to have a small improvement with a fluid bolus but then dropped to 67/54.  In discussion with patient and wife it was  decided to emergency cardioversion due to unstable nature.  Patient verbally consented to the procedure.  After cardioversion, patient had returned to normal sinus rhythm.  Patient has had persistent low blood pressures, 80 to over 60s despite a total of 3 L of fluid.  Consult with cardiology completed, they will see the patient in the emergency department.  Kalman Drape, MD 09/23/13 [phone]

## 2013-09-23 NOTE — ED Notes (Signed)
Pt advised about step down bed.

## 2013-09-23 NOTE — Progress Notes (Signed)
ANTICOAGULATION CONSULT NOTE - Initial Consult  Pharmacy Consult for warfarin Indication: atrial fibrillation  Allergies  Allergen Reactions  . Other Anaphylaxis    Bee stings    Patient Measurements: Height: 6' (182.9 cm) Weight: 228 lb 2.8 oz (103.5 kg) IBW/kg (Calculated) : 77.6 Heparin Dosing Weight: n/a  Vital Signs: Temp: 97.5 F (36.4 C) (06/02 0818) Temp src: Oral (06/02 0818) BP: 115/74 mmHg (06/02 1345) Pulse Rate: 58 (06/02 1345)  Labs:  Recent Labs  09/23/13 0450 09/23/13 1500  HGB 16.1 13.6  HCT 46.4 40.0  PLT 211 151  CREATININE 1.38*  --     Estimated Creatinine Clearance: 51.4 ml/min (by C-G formula based on Cr of 1.38).   Medical History: Past Medical History  Diagnosis Date  . Hypertension   . High cholesterol   . Paroxysmal a-fib     a. 09/23/13 a-fib with RVR and hypotensive, requiring emergent cardioversion in ED  . Hypothyroidism   . Acute renal insufficiency     Medications:  Prescriptions prior to admission  Medication Sig Dispense Refill  . aspirin EC 81 MG tablet Take 81 mg by mouth daily.      . Cholecalciferol (VITAMIN D) 2000 UNITS CAPS Take 2,000 Units by mouth daily.      . Coenzyme Q10 (CO Q 10) 10 MG CAPS Take 10 mg by mouth daily.      . diphenhydrAMINE (BENADRYL) 25 mg capsule Take 50 mg by mouth daily as needed for allergies.      Marland Kitchen doxycycline (VIBRA-TABS) 100 MG tablet Take 100 mg by mouth 2 (two) times daily.      Marland Kitchen EPINEPHrine 0.3 mg/0.3 mL IJ SOAJ injection Inject 0.3 mg into the muscle daily as needed (allergic reaction).      . Eszopiclone 3 MG TABS Take 3 mg by mouth at bedtime. Take immediately before bedtime      . furosemide (LASIX) 40 MG tablet Take 40 mg by mouth daily as needed. For fluid retention      . levothyroxine (SYNTHROID, LEVOTHROID) 25 MCG tablet Take 25 mcg by mouth daily before breakfast.      . Multiple Vitamin (MULTIVITAMIN WITH MINERALS) TABS Take 1 tablet by mouth daily.      . mupirocin  ointment (BACTROBAN) 2 % Apply 1 application topically 2 (two) times daily as needed (for tick bite).      . simvastatin (ZOCOR) 40 MG tablet Take 40 mg by mouth daily.      . Tamsulosin HCl (FLOMAX) 0.4 MG CAPS Take 0.4 mg by mouth daily after supper.      . valsartan-hydrochlorothiazide (DIOVAN-HCT) 160-12.5 MG per tablet Take 1 tablet by mouth daily.        Assessment: 70 YOM with Afib s/p emergent cardioversion. Pharmacy consulted by cardiology to start warfarin for Afib. Patient is currently on prophylactic Lovenox. CHA2DS2-VASC score 3. CBC wnl. Pt is noted to be on doxycycline for tick bite.   Goal of Therapy:  INR 2-3 Monitor platelets by anticoagulation protocol: Yes   Plan:  1) Coumadin 7.5 mg x 1 dose today  2) Monitor daily INR and s/s of bleeding  3) Stop Lovenox when INR >2   Albertina Parr, PharmD.  Clinical Pharmacist Pager [phone]

## 2013-09-23 NOTE — ED Notes (Signed)
Notified EDP about low BP. Pt. Is asymptomatic and free of pain or resp distress.

## 2013-09-23 NOTE — ED Notes (Signed)
Breakfast ordered 

## 2013-09-23 NOTE — ED Notes (Signed)
Patient converted to Sinus rate of 79.

## 2013-09-23 NOTE — Care Management Note (Signed)
    Page 1 of 1   09/23/2013     2:00:20 PM CARE MANAGEMENT NOTE 09/23/2013  Patient:  Tom Norton, Tom Norton   Account Number:  [id number]  Date Initiated:  09/23/2013  Documentation initiated by:  Elissa Hefty  Subjective/Objective Assessment:   adm w shortness of breath, at fib     Action/Plan:   lives w wife, pcp dr Herbie [place] gates   Anticipated DC Date:     Anticipated DC Plan:           Choice offered to / List presented to:             Status of service:   Medicare Important Message given?   (If response is "NO", the following Medicare IM given date fields will be blank) Date Medicare IM given:   Date Additional Medicare IM given:    Discharge Disposition:    Per UR Regulation:  Reviewed for med. necessity/level of care/duration of stay  If discussed at [place] of Stay Meetings, dates discussed:    Comments:

## 2013-09-23 NOTE — ED Notes (Signed)
Pt states that he started Doxycycline yesterday after possible tick bite.  Then woke up with sob and heart fluttering.  Pt states took Benadryl to see if that would help.  Pt reports dizziness.  No pain

## 2013-09-23 NOTE — ED Notes (Signed)
Patient placed in trendelenburg 

## 2013-09-23 NOTE — Progress Notes (Signed)
Patient Name: Tom Norton Date of Encounter: 09/23/2013     Principal Problem:   New onset a-fib with RVR Active Problems:   Hypotension   Renal insufficiency   Hypothyroid    SUBJECTIVE  Woke up this am feeling increasing palpitation along with dyspnea, after emergent cardioversion, patient is feeling fine. Denies any dizziness despite continuous hypotension. No chest pain, palpitation or SOB.   CURRENT MEDS    OBJECTIVE  Filed Vitals:   09/23/13 0630 09/23/13 0645 09/23/13 0700 09/23/13 0818  BP: 94/60 88/61 92/64  88/63  Pulse: 71 66 66 61  Temp:    97.5 F (36.4 C)  TempSrc:    Oral  Resp: 20 22 8 14   Height:      Weight:      SpO2: 97% 98% 98% 98%    Intake/Output Summary (Last 24 hours) at 09/23/13 0918 Last data filed at 09/23/13 0731  Gross per 24 hour  Intake   2000 ml  Output    250 ml  Net   1750 ml   Filed Weights   09/23/13 0450  Weight: 220 lb (99.791 kg)    PHYSICAL EXAM  General: Pleasant, NAD. Neuro: Alert and oriented X 3. Moves all extremities spontaneously. Strength equal bilaterally in upper and lower extremity. No facial drooping Psych: Normal affect. HEENT:  Normal  Neck: Supple without bruits or JVD. Lungs:  Resp regular and unlabored, CTA Heart: RRR no s3, s4, or murmurs. Abdomen: Soft, non-tender, non-distended, BS + x 4.  Extremities: No clubbing, cyanosis or edema. DP/PT/Radials 2+ and equal bilaterally.  Accessory Clinical Findings  CBC  Recent Labs  09/23/13 0450  WBC 5.8  HGB 16.1  HCT 46.4  MCV 92.4  PLT 841   Basic Metabolic Panel  Recent Labs  09/23/13 0450  NA 139  K 4.3  CL 103  CO2 22  GLUCOSE 134*  BUN 15  CREATININE 1.38*  CALCIUM 10.0   TELE  Pre-5:12am: a-fib with HR 140s Post-5:12am: NSR with HR 50-70s, no recurrent a-fib  ECG  Post-cardioversion: NSR with HR 70s, poor R wave progression in anterior lead  Radiology/Studies  Dg Chest Port 1 View  09/23/2013   CLINICAL DATA:   Status post cardioversion.  Weakness.  EXAM: PORTABLE CHEST - 1 VIEW  COMPARISON:  None currently available  FINDINGS: Cardiomegaly, likely with left ventricular enlargement. Associated aortic tortuosity. No edema, consolidation, effusion, or pneumothorax.  IMPRESSION: Cardiomegaly without failure.   Electronically Signed   By: Jorje Guild M.D.   On: 09/23/2013 06:39    ASSESSMENT AND PLAN  1. A-fib with RVR: requiring emergent cardioversion in ED  - CHA2DS2-Vasc score 3 (HTN, age +/- PAD) - will need chronic anticoagulation, continue on lovenox for now  - discuss some of the benefit and risk with coumadin vs NOAC, pt and wife to decide  - continue to hold diovan-hct, will need to switch to diltiazem or metoprolol once SBP come up  - denies any h/o chest pain or increasing SOB on exertion, however with new Dx of Afib leading to hypotension - will check Myoview Stress Test in AM once BP improved.  - pending echo  2. Hypotension  - may be combination of a-fib with RVR and dehydration  - SBP stable in mid 80s to 90s after 2 L fluid, currently running 3rd L at slow rate to avoid pulm edema  - does take lasix at home for occasional LE edema, however only rarely take, <1  time per month  - hold home BP med, will likely switch to rate control med once BP improve  3. Hypothyroidism  - pending TSH and F T4  - continue synthroid  4. H/o HTN, now hypotensive  5. renal insufficiency, likely acute  - soft IV hydration  Signed, Almyra Deforest PA Pager: [phone]  I have seen & examined the patient along with Mr. Almyra Deforest - I agree with his brief findings, exam & recommendations.  New onset - symptomatic Afib with RVR - notably with hypotension.  NO CP  But + Dyspnea. Maintaining NSR post DCCV.   -- will need to start [facility], Echo being done now -- will await results -- if relatively normal, plan Myoview in AM for ischemic eval;; if EF is low - would consider LHC-Angio instead.  Agree with holding home  BP meds to allow BP room to use potential rate control agents.  Follow labs including TSH & Cr.  Leonie Man, MD

## 2013-09-23 NOTE — Consult Note (Signed)
Cardiology history and physical Note  Patient ID: Tom Norton, MRN: [id number], DOB/AGE: Jun 18, 1929 80 y.o. Admit date: 09/23/2013   Date of Consult: 09/23/2013 Primary Physician: Henrine Screws, MD Primary Cardiologist: nill   Chief Complaint:  Reason for Admission: Afib , hypotension    Assessment and Plan:  Afib  Hypotension - ? Dehydration related, unusual for afib to cause low BP by itself Renal Insufficiency - ? Acute on chronic related to dehydration  Hypothryoidism   Plan   Check TSH , Echocardiogram  Monitor on tele in MSD  Chest xray still pending will need to followup on results .    80 yr old male with hx of HTN, HLD p/w SOB and fluttering and found to have Afib with RVR s/p emergent cardioversion in there ER now being seen by cardiology   HPI: pt states that he woke up this am feeling SOB . He took some benadryl thinking this may be an adverse reaction to the Doxycycline that he has been started on for a Tick bite. However this did not help and he began to feel the palpitation in his chest which prompted him to come to the ER. On arrival pt was noted to be hypotensive. ER physician cardioverted the pt for SBP 60's and Afib with RVR . Pt subsequently was noted to be NSR but having persistent hypotension with SBP in 70's. Currently he is s/p 3 litres of IV fluids with improvement of BP to the 90's .   Pt as baseline denies any orthopnea, PND , LE edema , DOE , chest pain, focal weakness, syncope, bleeding diathesis , claudication , palpitation etc .  Reports medication compliance. No prior cardiac workup .  States that he takes occasional glass of wine and last night had taken small amounts of scotch     Past Medical History  Diagnosis Date  . Hypertension   . High cholesterol       Most Recent Cardiac Studies: EKG 09/23/2013 @ 452 am Afib with RVR, incomplete RBBB, LAD   EKG 09/23/2013@0511  NSR , incomplete RBBB, borderline LAD    Surgical History:   Past Surgical History  Procedure Laterality Date  . Hernia repair       Home Meds: Prior to Admission medications   Medication Sig Start Date End Date Taking? Authorizing Provider  aspirin EC 81 MG tablet Take 81 mg by mouth daily.   Yes Historical Provider, MD  Cholecalciferol (VITAMIN D) 2000 UNITS CAPS Take 2,000 Units by mouth daily.   Yes Historical Provider, MD  Coenzyme Q10 (CO Q 10) 10 MG CAPS Take 10 mg by mouth daily.   Yes Historical Provider, MD  diphenhydrAMINE (BENADRYL) 25 mg capsule Take 50 mg by mouth daily as needed for allergies.   Yes Historical Provider, MD  doxycycline (VIBRA-TABS) 100 MG tablet Take 100 mg by mouth 2 (two) times daily.   Yes Historical Provider, MD  EPINEPHrine 0.3 mg/0.3 mL IJ SOAJ injection Inject 0.3 mg into the muscle daily as needed (allergic reaction).   Yes Historical Provider, MD  Eszopiclone 3 MG TABS Take 3 mg by mouth at bedtime. Take immediately before bedtime   Yes Historical Provider, MD  furosemide (LASIX) 40 MG tablet Take 40 mg by mouth daily as needed. For fluid retention   Yes Historical Provider, MD  levothyroxine (SYNTHROID, LEVOTHROID) 25 MCG tablet Take 25 mcg by mouth daily before breakfast.   Yes Historical Provider, MD  Multiple Vitamin (MULTIVITAMIN WITH MINERALS) TABS  Take 1 tablet by mouth daily.   Yes Historical Provider, MD  mupirocin ointment (BACTROBAN) 2 % Apply 1 application topically 2 (two) times daily as needed (for tick bite).   Yes Historical Provider, MD  simvastatin (ZOCOR) 40 MG tablet Take 40 mg by mouth daily.   Yes Historical Provider, MD  Tamsulosin HCl (FLOMAX) 0.4 MG CAPS Take 0.4 mg by mouth daily after supper.   Yes Historical Provider, MD  valsartan-hydrochlorothiazide (DIOVAN-HCT) 160-12.5 MG per tablet Take 1 tablet by mouth daily.   Yes Historical Provider, MD    Inpatient Medications:     Allergies:  Allergies  Allergen Reactions  . Other Anaphylaxis    Bee stings    History   Social  History  . Marital Status: Married    Spouse Name: N/A    Number of Children: N/A  . Years of Education: N/A   Occupational History  . Not on file.   Social History Main Topics  . Smoking status: Never Smoker   . Smokeless tobacco: Not on file  . Alcohol Use: 6.0 oz/week    10 Glasses of wine per week  . Drug Use: No  . Sexual Activity: Not on file   Other Topics Concern  . Not on file   Social History Narrative  . No narrative on file     No family history on file.   Review of Systems: General: negative for chills, fever, night sweats or weight changes.  Cardiovascular: negative for chest pain, edema, orthopnea, palpitations, paroxysmal nocturnal dyspnea, shortness of breath or dyspnea on exertion Dermatological: negative for rash Respiratory: negative for cough or wheezing Urologic: negative for hematuria Abdominal: negative for nausea, vomiting, diarrhea, bright red blood per rectum, melena, or hematemesis Neurologic: negative for visual changes, syncope, or dizziness All other systems reviewed and are otherwise negative except as noted above.  Labs: No results found for this basename: CKTOTAL, CKMB, TROPONINI,  in the last 72 hours Lab Results  Component Value Date   WBC 5.8 09/23/2013   HGB 16.1 09/23/2013   HCT 46.4 09/23/2013   MCV 92.4 09/23/2013   PLT 211 09/23/2013    Recent Labs Lab 09/23/13 0450  NA 139  K 4.3  CL 103  CO2 22  BUN 15  CREATININE 1.38*  CALCIUM 10.0  GLUCOSE 134*    Trop I 0.00    Radiology/Studies:  Chest xray pending      Physical Exam: Blood pressure 88/61, pulse 77, temperature 98.2 F (36.8 C), temperature source Oral, resp. rate 13, height 6' (1.829 m), weight 99.791 kg (220 lb), SpO2 96.00%. General: Well developed, well nourished, in no acute distress.  Neck: Negative for carotid bruits. JVD not elevated. Lungs: Clear bilaterally to auscultation without wheezes, rales, or rhonchi. Breathing is unlabored. Heart: RRR  with S1 S2. No murmurs, rubs, or gallops appreciated. Abdomen: Soft, non-tender, non-distended with normoactive bowel sounds. No hepatomegaly. No rebound/guarding. No obvious abdominal masses. Extremities: No clubbing or cyanosis. No edema.  Distal pedal pulses are 2+ and equal bilaterally. Neuro: Alert and oriented X 3. No facial asymmetry. No focal deficit. Moves all extremities spontaneously. Psych:  Responds to questions appropriately with a normal affect.     Signed, Grafton Folk M.D  09/23/2013, 6:02 AM

## 2013-09-23 NOTE — ED Notes (Signed)
Attempted report 

## 2013-09-24 ENCOUNTER — Encounter (HOSPITAL_COMMUNITY): Payer: Self-pay | Admitting: Physician Assistant

## 2013-09-24 ENCOUNTER — Inpatient Hospital Stay (HOSPITAL_COMMUNITY): Payer: Medicare Other

## 2013-09-24 DIAGNOSIS — I4891 Unspecified atrial fibrillation: Secondary | ICD-10-CM | POA: Diagnosis not present

## 2013-09-24 LAB — CBC
HCT: 39.9 % (ref 39.0–52.0)
HEMOGLOBIN: 13.3 g/dL (ref 13.0–17.0)
MCH: 31.2 pg (ref 26.0–34.0)
MCHC: 33.3 g/dL (ref 30.0–36.0)
MCV: 93.7 fL (ref 78.0–100.0)
Platelets: 180 10*3/uL (ref 150–400)
RBC: 4.26 MIL/uL (ref 4.22–5.81)
RDW: 13.1 % (ref 11.5–15.5)
WBC: 5.3 10*3/uL (ref 4.0–10.5)

## 2013-09-24 LAB — BASIC METABOLIC PANEL
BUN: 16 mg/dL (ref 6–23)
CALCIUM: 9.1 mg/dL (ref 8.4–10.5)
CO2: 23 mEq/L (ref 19–32)
Chloride: 109 mEq/L (ref 96–112)
Creatinine, Ser: 1.21 mg/dL (ref 0.50–1.35)
GFR, EST AFRICAN AMERICAN: 63 mL/min — AB (ref >90)
GFR, EST NON AFRICAN AMERICAN: 54 mL/min — AB (ref >90)
GLUCOSE: 95 mg/dL (ref 70–99)
Potassium: 4.5 mEq/L (ref 3.7–5.3)
SODIUM: 141 meq/L (ref 137–147)

## 2013-09-24 MED ORDER — REGADENOSON 0.4 MG/5ML IV SOLN
INTRAVENOUS | Status: AC
  Administered 2013-09-24: 0.4 mg
  Filled 2013-09-24: qty 5

## 2013-09-24 MED ORDER — METOPROLOL TARTRATE 25 MG PO TABS: 12.5000 mg | ORAL_TABLET | Freq: Two times a day (BID) | ORAL | Status: DC

## 2013-09-24 MED ORDER — WARFARIN SODIUM 7.5 MG PO TABS
7.5000 mg | ORAL_TABLET | Freq: Once | ORAL | Status: DC
  Filled 2013-09-24: qty 1

## 2013-09-24 MED ORDER — RIVAROXABAN 20 MG PO TABS
20.0000 mg | ORAL_TABLET | Freq: Every day | ORAL | Status: DC
  Administered 2013-09-24: 20 mg via ORAL
  Filled 2013-09-24: qty 1

## 2013-09-24 MED ORDER — TECHNETIUM TC 99M SESTAMIBI GENERIC - CARDIOLITE
30.0000 | Freq: Once | INTRAVENOUS | Status: AC | PRN
  Administered 2013-09-24: 30 via INTRAVENOUS

## 2013-09-24 MED ORDER — TECHNETIUM TC 99M SESTAMIBI GENERIC - CARDIOLITE
10.0000 | Freq: Once | INTRAVENOUS | Status: AC | PRN
  Administered 2013-09-24: 10 via INTRAVENOUS

## 2013-09-24 MED ORDER — RIVAROXABAN 20 MG PO TABS: 20.0000 mg | ORAL_TABLET | Freq: Every day | ORAL | Status: DC

## 2013-09-24 NOTE — Discharge Summary (Signed)
Discharge Summary   Patient ID: Tom Norton MRN: [id number], DOB/AGE: 01-18-1930 80 y.o. Admit date: 09/23/2013 D/C date:     09/24/2013  Primary Care Provider: Henrine Screws, MD Primary Cardiologist: ? Ellyn Hack or Croitoru both saw this admission  Primary Discharge Diagnoses:  1. New onset atrial fibrillation s/p emergent DCCV in ER  2. Sinus bradycardia/junctional bradycardia, transient 3. Hypotension, unclear etiology  4. Acute renal insufficiency possibly r/t dehydration, resolved  5. Hypothyroidism - TSH 6.9 but fT4 0.98  6. H/o HTN  7. Hyperlipidemia  8. Recent tick bite, on doxycycline  9. Dilated aortic root by echocardiogram 4.2cm 10. ObesityBody mass index is 30.64 kg/(m^2).  Hospital Course: Tom Norton is an 80 y/o M with history of HTN, HL, recent tick bite on doxycycline who presented to [facility] c/o SOB and chest fluttering. He was found to have AF RVR with associated hypotension with SBP in the 60's. The EDP emergently cardioverted him to NSR. He had persistent hypotension after restoration of sinus rhythm (SBP 70s) responsive to 3L of IVF. Cr was bumped slightly to 1.38 felt due to dehydration. His home antihypertensives were stopped. TSH was elevated at 6.980 but free T4 was normal. Initial troponin was normal. He was admitted and gently hydrated with IVF. 2D echo showed EF 55-60%, mild AI, moderately dilated aortic root, mod dilated LA/RA. Due to new diagnosis of AF, Lexiscan myoview was obtained which was normal per Dr. Victorino December report, EF 64%. His CHADSVASC is felt to be 3 for HTN, age x 2 thus anticoagulation was started. Initially he was started on Coumadin but ultimately chose a NOAC instead in the form of Xarelto. Note that renal function has improved with CrCl of 67 this dose of 20mg  qsupper was chosen. Dr. Sallyanne Kuster stopped his aspirin since nuc was normal and he will be on NOAC. The patient did have transient sinus bradycardia/junctional bradycardia  which was asymptomatic. This resolved. Dr. Sallyanne Kuster recommended metoprolol 12.5mg  BID at discharge. Dr. Sallyanne Kuster has seen and examined the patient today and feels he is stable for discharge.  I have left a message on our office's scheduling voicemail requesting a follow-up appointment and med management visit for periodic monitoring while on NOAC, and our office will call the patient with this appointment.   Discharge Vitals: Blood pressure 125/88, pulse 67, temperature 97.5 F (36.4 C), temperature source Oral, resp. rate 22, height 6' (1.829 m), weight 225 lb 15.5 oz (102.5 kg), SpO2 95.00%.  Labs: Lab Results  Component Value Date   WBC 5.3 09/24/2013   HGB 13.3 09/24/2013   HCT 39.9 09/24/2013   MCV 93.7 09/24/2013   PLT 180 09/24/2013    Recent Labs Lab 09/23/13 1500 09/24/13 0219  NA 140 141  K 5.1 4.5  CL 108 109  CO2 23 23  BUN 15 16  CREATININE 1.04 1.21  CALCIUM 9.0 9.1  PROT 5.4*  --   BILITOT 0.4  --   ALKPHOS 57  --   ALT 16  --   AST 29  --   GLUCOSE 92 95    Diagnostic Studies/Procedures   Nm Myocar Multi W/spect W/wall Motion / Ef  09/24/2013   CLINICAL DATA:  Atrial fibrillation.  Hypotension  EXAM: MYOCARDIAL IMAGING WITH SPECT (REST AND PHARMACOLOGIC-STRESS)  GATED LEFT VENTRICULAR WALL MOTION STUDY  LEFT VENTRICULAR EJECTION FRACTION  TECHNIQUE: Standard myocardial SPECT imaging was performed after resting intravenous injection of 10 mCi Tc-82m sestamibi. Subsequently, intravenous infusion of Lexiscan was  performed under the supervision of the Cardiology staff. At peak effect of the drug, 30 mCi Tc-44m sestamibi was injected intravenously and standard myocardial SPECT imaging was performed. Quantitative gated imaging was also performed to evaluate left ventricular wall motion, and estimate left ventricular ejection fraction.  COMPARISON:  None.  FINDINGS: SPECT imaging demonstrates decreased activity inferiorly on rest images, improving on stress images compatible with  attenuation. No reversible defects to suggest ischemia.  Quantitative gated analysis shows normal wall motion.  The resting left ventricular ejection fraction is 60 for% with end-diastolic volume of 91 ml and end-systolic volume of 32 ml.  IMPRESSION: No evidence of ischemia or infarction.  Diaphragmatic attenuation.  Ejection fraction 64%.   Electronically Signed   By: Rolm Baptise M.D.   On: 09/24/2013 17:25   Dg Chest Port 1 View  09/23/2013   CLINICAL DATA:  Status post cardioversion.  Weakness.  EXAM: PORTABLE CHEST - 1 VIEW  COMPARISON:  None currently available  FINDINGS: Cardiomegaly, likely with left ventricular enlargement. Associated aortic tortuosity. No edema, consolidation, effusion, or pneumothorax.  IMPRESSION: Cardiomegaly without failure.   Electronically Signed   By: Jorje Guild M.D.   On: 09/23/2013 06:39   Echo 6/2 - Left ventricle: The cavity size was normal. Wall thickness was normal. Systolic function was normal. The estimated ejection fraction was in the range of 55% to 60%. - Aortic valve: There was mild regurgitation. - Aorta: Ascending aortic root moderately dilated. - Left atrium: The atrium was moderately dilated. - Right atrium: The atrium was moderately dilated. - Atrial septum: No defect or patent foramen ovale was identified.    Discharge Medications   Current Discharge Medication List    START taking these medications   Details  metoprolol tartrate (LOPRESSOR) 25 MG tablet Take 0.5 tablets (12.5 mg total) by mouth 2 (two) times daily. Qty: 30 tablet, Refills: 2    rivaroxaban (XARELTO) 20 MG TABS tablet Take 1 tablet (20 mg total) by mouth daily with supper. Qty: 30 tablet, Refills: 6      CONTINUE these medications which have NOT CHANGED   Details  Cholecalciferol (VITAMIN D) 2000 UNITS CAPS Take 2,000 Units by mouth daily.    Coenzyme Q10 (CO Q 10) 10 MG CAPS Take 10 mg by mouth daily.    diphenhydrAMINE (BENADRYL) 25 mg capsule Take 50 mg  by mouth daily as needed for allergies.    doxycycline (VIBRA-TABS) 100 MG tablet Take 100 mg by mouth 2 (two) times daily.    EPINEPHrine 0.3 mg/0.3 mL IJ SOAJ injection Inject 0.3 mg into the muscle daily as needed (allergic reaction).    Eszopiclone 3 MG TABS Take 3 mg by mouth at bedtime. Take immediately before bedtime    levothyroxine (SYNTHROID, LEVOTHROID) 25 MCG tablet Take 25 mcg by mouth daily before breakfast.    Multiple Vitamin (MULTIVITAMIN WITH MINERALS) TABS Take 1 tablet by mouth daily.    mupirocin ointment (BACTROBAN) 2 % Apply 1 application topically 2 (two) times daily as needed (for tick bite).    simvastatin (ZOCOR) 40 MG tablet Take 40 mg by mouth daily.    Tamsulosin HCl (FLOMAX) 0.4 MG CAPS Take 0.4 mg by mouth daily after supper.      STOP taking these medications     aspirin EC 81 MG tablet      furosemide (LASIX) 40 MG tablet      valsartan-hydrochlorothiazide (DIOVAN-HCT) 160-12.5 MG per tablet  Disposition   The patient will be discharged in stable condition to home. Discharge Instructions   Diet - low sodium heart healthy    Complete by:  As directed      Increase activity slowly    Complete by:  As directed   If symptoms return, seek medical attention.          Follow-up Information   Follow up with HARDING,DAVID W, MD. (Our office will call you for a follow-up appointment. Please call the office if you have not heard from [place] within 3 days.)    Specialty:  Cardiology   Contact information:   [address] [place] [address] [phone]       Follow up with GATES,ROBERT NEVILL, MD. (Your thyroid function was mildly abnormal - TSH was 6.980 but free T4 was normal. Follow up with primary doctor for recheck.)    Specialty:  Internal Medicine   Contact information:   [address] [place] [address] [phone]         Duration of Discharge Encounter: Greater than 30 minutes including  physician and PA time.  Signed, Charlie Pitter PA-C 09/24/2013, 6:02 PM

## 2013-09-24 NOTE — Progress Notes (Signed)
[place] for Warfarin Indication: atrial fibrillation  Allergies  Allergen Reactions  . Other Anaphylaxis    Bee stings    Patient Measurements: Height: 6' (182.9 cm) Weight: 225 lb 15.5 oz (102.5 kg) IBW/kg (Calculated) : 77.6 Heparin Dosing Weight: n/a  Vital Signs: Temp: 97.4 F (36.3 C) (06/03 0811) Temp src: Oral (06/03 0811) BP: 92/52 mmHg (06/03 0811) Pulse Rate: 50 (06/03 0811)  Labs:  Recent Labs  09/23/13 0450 09/23/13 1500 09/24/13 0219  HGB 16.1 13.6 13.3  HCT 46.4 40.0 39.9  PLT 211 151 180  CREATININE 1.38* 1.04 1.21    Estimated Creatinine Clearance: 58.3 ml/min (by C-G formula based on Cr of 1.21).   Assessment: 6 YOM with Afib s/p emergent cardioversion. Pharmacy consulted by cardiology to start warfarin for Afib. Patient is currently on prophylactic Lovenox. CHA2DS2-VASC score 3. CBC wnl. Pt is noted to be on doxycycline for tick bite.   Goal of Therapy:  INR 2-3 Monitor platelets by anticoagulation protocol: Yes   Plan:  1) Repeat Coumadin 7.5 mg x 1 dose today  2) Monitor daily INR and s/s of bleeding  3) Stop Lovenox when INR >2   Thank you  Anette Guarneri, PharmD [phone]

## 2013-09-24 NOTE — Progress Notes (Signed)
Patient: Tom Norton / Admit Date: 09/23/2013 / Date of Encounter: 09/24/2013, 11:35 AM  Subjective  No complaints. Tolerated Lexiscan without complaint.  Objective   Telemetry: sinus bradycardia rates 40 - low 50s, brief junctional rhythm between 7am-8am  Physical Exam: Blood pressure 92/52, pulse 50, temperature 97.4 F (36.3 C), temperature source Oral, resp. rate 28, height 6' (1.829 m), weight 225 lb 15.5 oz (102.5 kg), SpO2 94.00%. General: Well developed, well nourished WM, in no acute distress. Head: Normocephalic, atraumatic, sclera non-icteric, no xanthomas, nares are without discharge. Neck:  JVP not elevated. Lungs: Clear bilaterally to auscultation without wheezes, rales, or rhonchi. Breathing is unlabored. Heart: RRR S1 S2 without murmurs, rubs, or gallops.  Abdomen: Soft, non-tender, non-distended with normoactive bowel sounds. No rebound/guarding. Extremities: No clubbing or cyanosis. No edema. Distal pedal pulses are 2+ and equal bilaterally. Neuro: Alert and oriented X 3. Moves all extremities spontaneously. Psych:  Responds to questions appropriately with a normal affect.   Intake/Output Summary (Last 24 hours) at 09/24/13 1135 Last data filed at 09/24/13 0949  Gross per 24 hour  Intake    840 ml  Output   1925 ml  Net  -1085 ml    Inpatient Medications:  . antiseptic oral rinse  15 mL Mouth Rinse BID  . aspirin EC  81 mg Oral Daily  . doxycycline  100 mg Oral BID  . enoxaparin (LOVENOX) injection  40 mg Subcutaneous Q24H  . levothyroxine  25 mcg Oral QAC breakfast  . multivitamin with minerals  1 tablet Oral Daily  . simvastatin  40 mg Oral q1800  . tamsulosin  0.4 mg Oral QPC supper  . warfarin  7.5 mg Oral ONCE-1800  . Warfarin - Pharmacist Dosing Inpatient   Does not apply q1800   Infusions:    Labs:  Recent Labs  09/23/13 1500 09/24/13 0219  NA 140 141  K 5.1 4.5  CL 108 109  CO2 23 23  GLUCOSE 92 95  BUN 15 16  CREATININE 1.04 1.21   CALCIUM 9.0 9.1    Recent Labs  09/23/13 1500  AST 29  ALT 16  ALKPHOS 57  BILITOT 0.4  PROT 5.4*  ALBUMIN 3.0*    Recent Labs  09/23/13 1500 09/24/13 0219  WBC 4.7 5.3  HGB 13.6 13.3  HCT 40.0 39.9  MCV 93.7 93.7  PLT 151 180   No results found for this basename: CKTOTAL, CKMB, TROPONINI,  in the last 72 hours No components found with this basename: POCBNP,   Recent Labs  09/23/13 1500  HGBA1C 5.5     Radiology/Studies:  Dg Chest Port 1 View  09/23/2013   CLINICAL DATA:  Status post cardioversion.  Weakness.  EXAM: PORTABLE CHEST - 1 VIEW  COMPARISON:  None currently available  FINDINGS: Cardiomegaly, likely with left ventricular enlargement. Associated aortic tortuosity. No edema, consolidation, effusion, or pneumothorax.  IMPRESSION: Cardiomegaly without failure.   Electronically Signed   By: Jorje Guild M.D.   On: 09/23/2013 06:39     Assessment and Plan  1. New onset atrial fibrillation s/p emergent DCCV in ER 2. Sinus bradycardia/junctional bradycardia, not on AV nodal blocking agents 3. Hypotension, unclear etiology 4. Acute renal insufficiency possibly r/t dehydration, resolved 5. Hypothyroidism - TSH 6.9 but fT4 0.98 6. H/o HTN 7. Hyperlipidemia 8. Recent tick bite, on doxycycline 9. Dilated aortic root by echocardiogram 4.2cm  BP/HR too low for rate controlling agents. Confirmed with Dr. Ellyn Hack to go ahead and  proceed with Lexiscan. CHADSVASC = 3 for now (HTN, age x2) so he was started on Coumadin per pharmacy. Not currently being bridged. 2D Echo showing normal EF 55-60%, mild AI, mod dilated AR, mod dilated LA/RA. ? Contribution of aortic root dilatation to hypotension. Will d/w MD. Nuc performed, results pending. MD to review tele and weigh in regarding bradycardia.  Signed, Melina Copa PA-C  I have seen and examined the patient along with Burna Mortimer, PA.  I have reviewed the chart, notes and new data.  I agree with PA/NP's note.  Key new  complaints: fatigue Key examination changes: bradycardia and low BP Key new findings / data: mildly elevated TSH, but normal FT4; normal LVEF, biatrial enlargement  PLAN: May need to consider a dual chamber pacemaker - he has symptoms suggestive of sinus node dysfunction/chronotropic incompetence despite not taking negative chronotropic agents. Ventricular rate during AF was 140s. Low BP may be partly due to hypovolemia (was receiving both HCTZ and furosemide) Biatrial dilation indicates increased risk of AF recurrence. Don't think the mild aortic root dilation is contributing to the current events.  Sanda Klein, MD, [place] [phone] 09/24/2013, 12:43 PM

## 2013-09-24 NOTE — Discharge Instructions (Addendum)
Information on my medicine - XARELTO (Rivaroxaban)  This medication education was reviewed with me or my healthcare representative as part of my discharge preparation.  The pharmacist that spoke with me during my hospital stay was:  Saundra Shelling, [facility]  Why was Xarelto prescribed for you? Xarelto was prescribed for you to reduce the risk of a blood clot forming that can cause a stroke if you have a medical condition called atrial fibrillation (a type of irregular heartbeat).  What do you need to know about xarelto ? Take your Xarelto ONCE DAILY at the same time every day with your evening meal. If you have difficulty swallowing the tablet whole, you may crush it and mix in applesauce just prior to taking your dose.  Take Xarelto exactly as prescribed by your doctor and DO NOT stop taking Xarelto without talking to the doctor who prescribed the medication.  Stopping without other stroke prevention medication to take the place of Xarelto may increase your risk of developing a clot that causes a stroke.  Refill your prescription before you run out.  After discharge, you should have regular check-up appointments with your healthcare provider that is prescribing your Xarelto.  In the future your dose may need to be changed if your kidney function or weight changes by a significant amount.  What do you do if you miss a dose? If you are taking Xarelto ONCE DAILY and you miss a dose, take it as soon as you remember on the same day then continue your regularly scheduled once daily regimen the next day. Do not take two doses of Xarelto at the same time or on the same day.   Important Safety Information A possible side effect of Xarelto is bleeding. You should call your healthcare provider right away if you experience any of the following:   Bleeding from an injury or your nose that does not stop.   Unusual colored urine (red or dark brown) or unusual colored stools (red or black).   Unusual  bruising for unknown reasons.   A serious fall or if you hit your head (even if there is no bleeding).  Some medicines may interact with Xarelto and might increase your risk of bleeding while on Xarelto. To help avoid this, consult your healthcare provider or pharmacist prior to using any new prescription or non-prescription medications, including herbals, vitamins, non-steroidal anti-inflammatory drugs (NSAIDs) and supplements.  This website has more information on Xarelto: [email or web address]

## 2013-09-30 ENCOUNTER — Encounter: Payer: Self-pay | Admitting: *Deleted

## 2013-09-30 ENCOUNTER — Telehealth: Payer: Self-pay | Admitting: *Deleted

## 2013-09-30 NOTE — Telephone Encounter (Signed)
This encounter was created in error - please disregard.

## 2013-09-30 NOTE — Telephone Encounter (Signed)
PA to [facility] for xarelto

## 2013-10-02 NOTE — Telephone Encounter (Signed)
PA approved xarelto through [facility], RX through 09/30/2014

## 2013-10-03 DIAGNOSIS — T148 Other injury of unspecified body region: Secondary | ICD-10-CM | POA: Diagnosis not present

## 2013-10-03 DIAGNOSIS — I4891 Unspecified atrial fibrillation: Secondary | ICD-10-CM | POA: Diagnosis not present

## 2013-10-03 DIAGNOSIS — E039 Hypothyroidism, unspecified: Secondary | ICD-10-CM | POA: Diagnosis not present

## 2013-10-03 DIAGNOSIS — I10 Essential (primary) hypertension: Secondary | ICD-10-CM | POA: Diagnosis not present

## 2013-10-07 ENCOUNTER — Ambulatory Visit: Payer: Medicare Other | Admitting: Cardiology

## 2013-10-31 DIAGNOSIS — H18419 Arcus senilis, unspecified eye: Secondary | ICD-10-CM | POA: Diagnosis not present

## 2013-12-04 DIAGNOSIS — L282 Other prurigo: Secondary | ICD-10-CM | POA: Diagnosis not present

## 2013-12-04 DIAGNOSIS — I10 Essential (primary) hypertension: Secondary | ICD-10-CM | POA: Diagnosis not present

## 2013-12-04 DIAGNOSIS — I4891 Unspecified atrial fibrillation: Secondary | ICD-10-CM | POA: Diagnosis not present

## 2013-12-08 ENCOUNTER — Ambulatory Visit (INDEPENDENT_AMBULATORY_CARE_PROVIDER_SITE_OTHER): Payer: Medicare Other | Admitting: Cardiovascular Disease

## 2013-12-08 ENCOUNTER — Encounter: Payer: Self-pay | Admitting: Cardiovascular Disease

## 2013-12-08 VITALS — BP 126/84 | HR 65 | Resp 16 | Ht 72.0 in | Wt 222.1 lb

## 2013-12-08 DIAGNOSIS — R21 Rash and other nonspecific skin eruption: Secondary | ICD-10-CM

## 2013-12-08 DIAGNOSIS — I4891 Unspecified atrial fibrillation: Secondary | ICD-10-CM | POA: Diagnosis not present

## 2013-12-08 NOTE — Patient Instructions (Signed)
Stop Xarelto x 30 days and see if the rash disappears.  Dr. Sallyanne Kuster recommends that you schedule a follow-up appointment in: 1 month.

## 2013-12-09 ENCOUNTER — Telehealth: Payer: Self-pay | Admitting: Cardiovascular Disease

## 2013-12-09 NOTE — Telephone Encounter (Signed)
Suggested patient restart ASA 81mg  daily while off Xarelto.  Patient voiced understanding.

## 2013-12-09 NOTE — Telephone Encounter (Signed)
Please call,wants to know if he need to take a low dose of aspirin since he is not going to take his Xarelto for 30 days?

## 2013-12-10 ENCOUNTER — Encounter: Payer: Self-pay | Admitting: Cardiovascular Disease

## 2013-12-10 DIAGNOSIS — R21 Rash and other nonspecific skin eruption: Secondary | ICD-10-CM | POA: Insufficient documentation

## 2013-12-10 NOTE — Progress Notes (Signed)
Patient ID: Tom Norton, male   DOB: December 31, 1929, 80 y.o.   MRN: [id number]      Reason for office visit Paroxysmal atrial fibrillation  Tom Norton is an 80 year old man who presented with new onset atrial fibrillation with rapid ventricular response and hypotension on 09/23/2013. He underwent urgent cardioversion in the emergency room and has maintained normal sinus rhythm since then. His hypotension persisted following his cardioversion and he required 3 L of intravenous fluids for blood pressure normalization. His creatinine was slightly elevated and improved following hydration suggesting that he have been hypovolemic. Cardiac enzymes were normal. His echocardiogram showed normal left ventricular systolic function mild aortic insufficiency secondary to a moderately dilated aortic root (44 mm) and moderate biatrial enlargement (LA ESVI 36 mL/msq). He underwent a myocardial perfusion study (Lexi scan Myoview) with normal findings. CHADSVasc score is 3 and he is currently taking Xarelto. He has not had a history of embolic events but both his parents and one of his brothers have had strokes. He has treated hypertension and hyperlipidemia. He has bruising and easy bleeding if he injures his forearms, but otherwise no bleeding complications. He is quite active for an 80 year old and works in his yard 3-4 days a week for 3-4 hours at a time.  He states that since he started taking anticoagulant he has developed a rash. He has multiple small reddish papules distributed over his arms forearms and torso. To me they look more like tiny insect bites than a drug reaction. They are mildly pruritic. I'm not sure if there is any connection, but just before his episode of atrial fibrillation he had taken one dose of doxycycline for an erythematous rash on his calf , following a tick bite.  He thinks the rash is related to the new medication (xarelto) and is also concerned about the cost of this medication when he  reaches the Medicare part D "donut hole".  His rhythm today is normal sinus rhythm with frequent PACs.   Allergies  Allergen Reactions  . Other Anaphylaxis    Bee stings  . Doxycycline Hyclate     Current Outpatient Prescriptions  Medication Sig Dispense Refill  . Cholecalciferol (VITAMIN D) 2000 UNITS CAPS Take 1,000 Units by mouth 4 (four) times daily.       . Coenzyme Q10 (CO Q 10) 10 MG CAPS Take 10 mg by mouth daily.      . diphenhydrAMINE (BENADRYL) 25 mg capsule Take 50 mg by mouth daily as needed for allergies.      Marland Kitchen EPINEPHrine 0.3 mg/0.3 mL IJ SOAJ injection Inject 0.3 mg into the muscle daily as needed (allergic reaction).      . Eszopiclone 3 MG TABS Take 3 mg by mouth at bedtime. Take immediately before bedtime      . levothyroxine (SYNTHROID, LEVOTHROID) 25 MCG tablet Take 25 mcg by mouth daily before breakfast.      . metoprolol tartrate (LOPRESSOR) 25 MG tablet Take 0.5 tablets (12.5 mg total) by mouth 2 (two) times daily.  30 tablet  2  . Multiple Vitamin (MULTIVITAMIN WITH MINERALS) TABS Take 1 tablet by mouth daily.      . mupirocin ointment (BACTROBAN) 2 % Apply 1 application topically 2 (two) times daily as needed (for tick bite).      . rivaroxaban (XARELTO) 20 MG TABS tablet Take 1 tablet (20 mg total) by mouth daily with supper.  30 tablet  6  . simvastatin (ZOCOR) 40 MG tablet Take 40 mg  by mouth daily.      . Tamsulosin HCl (FLOMAX) 0.4 MG CAPS Take 0.4 mg by mouth daily after supper.       No current facility-administered medications for this visit.    Past Medical History  Diagnosis Date  . Hypertension   . High cholesterol   . Paroxysmal a-fib     a. 09/23/13 a-fib with RVR and hypotensive, requiring emergent cardioversion in ED  . Hypothyroidism   . Acute renal insufficiency     a. 09/2013: dehydration.  . Sinus bradycardia   . Junctional bradycardia   . Hypotension     a. 09/2013 adm felt due to dehydration.  . Dilated aortic root     a. 09/2013:  4.2cm by echo.  . Obesity     Past Surgical History  Procedure Laterality Date  . Hernia repair    . Transthoracic echocardiogram  09/23/2013    EF 55-60%; normal wall motion. Moderately dilated aortic root and ascending aorta; mild AI; moderate bi-atrial enlargement    No family history on file.  History   Social History  . Marital Status: Married    Spouse Name: N/A    Number of Children: N/A  . Years of Education: N/A   Occupational History  . Not on file.   Social History Main Topics  . Smoking status: Never Smoker   . Smokeless tobacco: Not on file  . Alcohol Use: 6.0 oz/week    10 Glasses of wine per week  . Drug Use: No  . Sexual Activity: Not on file   Other Topics Concern  . Not on file   Social History Narrative   Married. Wife is a patient of Dr. Radford Pax.   At least one son.   Never smoked. Up to 10 glasses of wine a week    Review of systems: The patient specifically denies any chest pain at rest or with exertion, dyspnea at rest or with exertion, orthopnea, paroxysmal nocturnal dyspnea, syncope, palpitations, focal neurological deficits, intermittent claudication, lower extremity edema, unexplained weight gain, cough, hemoptysis or wheezing.  The patient also denies abdominal pain, nausea, vomiting, dysphagia, diarrhea, constipation, polyuria, polydipsia, dysuria, hematuria, frequency, urgency, abnormal bleeding or bruising, fever, chills, unexpected weight changes, mood swings, change in skin or hair texture, change in voice quality, auditory or visual problems, allergic reactions or rashes, new musculoskeletal complaints other than usual "aches and pains".   PHYSICAL EXAM BP 126/84  Pulse 65  Resp 16  Ht 6' (1.829 m)  Wt 222 lb 1.6 oz (100.744 kg)  BMI 30.12 kg/m2  General: Alert, oriented x3, no distress Head: no evidence of trauma, PERRL, EOMI, no exophtalmos or lid lag, no myxedema, no xanthelasma; normal ears, nose and oropharynx Neck: normal  jugular venous pulsations and no hepatojugular reflux; brisk carotid pulses without delay and no carotid bruits Chest: clear to auscultation, no signs of consolidation by percussion or palpation, normal fremitus, symmetrical and full respiratory excursions Cardiovascular: normal position and quality of the apical impulse, regular rhythm with occasional ectopy, normal first and second heart sounds, no murmurs, rubs or gallops Abdomen: no tenderness or distention, no masses by palpation, no abnormal pulsatility or arterial bruits, normal bowel sounds, no hepatosplenomegaly Extremities: no clubbing, cyanosis or edema; 2+ radial, ulnar and brachial pulses bilaterally; 2+ right femoral, posterior tibial and dorsalis pedis pulses; 2+ left femoral, posterior tibial and dorsalis pedis pulses; no subclavian or femoral bruits Neurological: grossly nonfocal Skin:A couple of dozen of erythematous papules are  seen scattered over his upper extremities and torso, especially over his shoulders  EKG: Sinus rhythm with frequent PACs otherwise normal tracing  Lipid Panel  No results found for this basename: chol, trig, hdl, cholhdl, vldl, ldlcalc    BMET    Component Value Date/Time   NA 141 09/24/2013 0219   K 4.5 09/24/2013 0219   CL 109 09/24/2013 0219   CO2 23 09/24/2013 0219   GLUCOSE 95 09/24/2013 0219   BUN 16 09/24/2013 0219   CREATININE 1.21 09/24/2013 0219   CALCIUM 9.1 09/24/2013 0219   GFRNONAA 54* 09/24/2013 0219   GFRAA 63* 09/24/2013 0219     ASSESSMENT AND PLAN  Due to his age and systemic hypertension and moderate left atrial dilatation, Mr. Docken' risk of cardioembolic stroke is not trivial. He will be best served by long-term treatment with an anticoagulant. On the other hand short-term interruption of anticoagulants is likely to be very safe since she has never had a cardioembolic event and is now back in sinus rhythm. We'll temporarily interrupt the new medication to see if the rash resolves. My best  guess is that his rash is not a drug reaction. When he restarts taking anticoagulations and he will probably choose to take warfarin, to avoid the high cost of the novel agents. He understands that this will involve frequent blood testing for monitoring of anticoagulation. He is familiar with this, as his wife takes warfarin.  Patient Instructions  Stop Xarelto x 30 days and see if the rash disappears.  Dr. Sallyanne Kuster recommends that you schedule a follow-up appointment in: 1 month.      Orders Placed This Encounter  Procedures  . EKG 12-Lead   No orders of the defined types were placed in this encounter.    Holli Humbles, MD, [place] [phone] office [phone] pager

## 2013-12-22 ENCOUNTER — Other Ambulatory Visit (HOSPITAL_COMMUNITY): Payer: Self-pay | Admitting: Physician Assistant

## 2014-01-12 ENCOUNTER — Encounter: Payer: Self-pay | Admitting: Cardiovascular Disease

## 2014-01-12 ENCOUNTER — Ambulatory Visit (INDEPENDENT_AMBULATORY_CARE_PROVIDER_SITE_OTHER): Payer: Medicare Other | Admitting: Cardiovascular Disease

## 2014-01-12 VITALS — BP 126/74 | HR 62 | Ht 72.0 in | Wt 221.7 lb

## 2014-01-12 DIAGNOSIS — I4891 Unspecified atrial fibrillation: Secondary | ICD-10-CM

## 2014-01-12 MED ORDER — WARFARIN SODIUM 5 MG PO TABS: 5.0000 mg | ORAL_TABLET | Freq: Every day | ORAL | Status: DC

## 2014-01-12 NOTE — Progress Notes (Signed)
Patient ID: Tom Norton, male   DOB: October 24, 1929, 80 y.o.   MRN: [id number]      Reason for office visit Atrial fibrillation  Tom Norton is an 80 year old gentleman with recently diagnosed asymptomatic paroxysmal atrial fibrillation. He was started on anticoagulations with Xarelto and developed multiple erythematous papules. He stopped the medication and the skin abnormalities resolved within a week. He has not had any bleeding problems and does not have a history of stroke. He is in regular rhythm today. He does not have a history of embolic events.  He is concerned about the cost of novel anticoagulants and actually prefers warfarin. His wife takes warfarin for atrial fibrillation.  He has one residual red papule on the back of his left knee. His wife removed a black spot with tweezers from that area although they didn't see clear evidence that this was a tick.   Allergies  Allergen Reactions  . Other Anaphylaxis    Bee stings  . Doxycycline Hyclate     Current Outpatient Prescriptions  Medication Sig Dispense Refill  . Cholecalciferol (VITAMIN D) 2000 UNITS CAPS Take 1,000 Units by mouth 2 (two) times daily.       Marland Kitchen CINNAMON PO Take 200 mg by mouth daily.      . Coenzyme Q10 (CO Q 10) 10 MG CAPS Take 10 mg by mouth daily.      . diphenhydrAMINE (BENADRYL) 25 mg capsule Take 50 mg by mouth daily as needed for allergies.      Marland Kitchen EPINEPHrine 0.3 mg/0.3 mL IJ SOAJ injection Inject 0.3 mg into the muscle daily as needed (allergic reaction).      . Eszopiclone 3 MG TABS 1/2 to 1 tablet at bedtime..Take immediately before bedtime      . levothyroxine (SYNTHROID, LEVOTHROID) 25 MCG tablet Take 25 mcg by mouth daily before breakfast.      . metoprolol tartrate (LOPRESSOR) 25 MG tablet TAKE 1/2 TABLET BY MOUTH 2 (TWO) TIMES DAILY.  30 tablet  2  . Multiple Vitamin (MULTIVITAMIN WITH MINERALS) TABS Take 1 tablet by mouth daily.      . mupirocin ointment (BACTROBAN) 2 % Apply 1 application  topically 2 (two) times daily as needed (for tick bite).      . simvastatin (ZOCOR) 40 MG tablet Take 40 mg by mouth daily.      . Tamsulosin HCl (FLOMAX) 0.4 MG CAPS Take 0.4 mg by mouth daily after supper.      . warfarin (COUMADIN) 5 MG tablet Take 1 tablet (5 mg total) by mouth daily.  90 tablet  3   No current facility-administered medications for this visit.    Past Medical History  Diagnosis Date  . Hypertension   . High cholesterol   . Paroxysmal a-fib     a. 09/23/13 a-fib with RVR and hypotensive, requiring emergent cardioversion in ED  . Hypothyroidism   . Acute renal insufficiency     a. 09/2013: dehydration.  . Sinus bradycardia   . Junctional bradycardia   . Hypotension     a. 09/2013 adm felt due to dehydration.  . Dilated aortic root     a. 09/2013: 4.2cm by echo.  . Obesity     Past Surgical History  Procedure Laterality Date  . Hernia repair    . Transthoracic echocardiogram  09/23/2013    EF 55-60%; normal wall motion. Moderately dilated aortic root and ascending aorta; mild AI; moderate bi-atrial enlargement    No family history on  file.  History   Social History  . Marital Status: Married    Spouse Name: N/A    Number of Children: N/A  . Years of Education: N/A   Occupational History  . Not on file.   Social History Main Topics  . Smoking status: Never Smoker   . Smokeless tobacco: Not on file  . Alcohol Use: 6.0 oz/week    10 Glasses of wine per week  . Drug Use: No  . Sexual Activity: Not on file   Other Topics Concern  . Not on file   Social History Narrative   Married. Wife is a patient of Dr. Radford Pax.   At least one son.   Never smoked. Up to 10 glasses of wine a week    Review of systems: The patient specifically denies any chest pain at rest or with exertion, dyspnea at rest or with exertion, orthopnea, paroxysmal nocturnal dyspnea, syncope, palpitations, focal neurological deficits, intermittent claudication, lower extremity edema,  unexplained weight gain, cough, hemoptysis or wheezing.  The patient also denies abdominal pain, nausea, vomiting, dysphagia, diarrhea, constipation, polyuria, polydipsia, dysuria, hematuria, frequency, urgency, abnormal bleeding or bruising, fever, chills, unexpected weight changes, mood swings, change in skin or hair texture, change in voice quality, auditory or visual problems, allergic reactions or rashes, new musculoskeletal complaints other than usual "aches and pains".   PHYSICAL EXAM BP 126/74  Pulse 62  Ht 6' (1.829 m)  Wt 100.562 kg (221 lb 11.2 oz)  BMI 30.06 kg/m2  General: Alert, oriented x3, no distress Head: no evidence of trauma, PERRL, EOMI, no exophtalmos or lid lag, no myxedema, no xanthelasma; normal ears, nose and oropharynx Neck: normal jugular venous pulsations and no hepatojugular reflux; brisk carotid pulses without delay and no carotid bruits Chest: clear to auscultation, no signs of consolidation by percussion or palpation, normal fremitus, symmetrical and full respiratory excursions Cardiovascular: normal position and quality of the apical impulse, regular rhythm, normal first and second heart sounds, no murmurs, rubs or gallops Abdomen: no tenderness or distention, no masses by palpation, no abnormal pulsatility or arterial bruits, normal bowel sounds, no hepatosplenomegaly Extremities: no clubbing, cyanosis or edema; 2+ radial, ulnar and brachial pulses bilaterally; 2+ right femoral, posterior tibial and dorsalis pedis pulses; 2+ left femoral, posterior tibial and dorsalis pedis pulses; no subclavian or femoral bruits Neurological: grossly nonfocal   BMET    Component Value Date/Time   NA 141 09/24/2013 0219   K 4.5 09/24/2013 0219   CL 109 09/24/2013 0219   CO2 23 09/24/2013 0219   GLUCOSE 95 09/24/2013 0219   BUN 16 09/24/2013 0219   CREATININE 1.21 09/24/2013 0219   CALCIUM 9.1 09/24/2013 0219   GFRNONAA 54* 09/24/2013 0219   GFRAA 63* 09/24/2013 0219     ASSESSMENT  AND PLAN  Start warfarin 5 mg by mouth once daily today and followup in the Coumadin clinic with Tommy Medal, PharmD later this week. We'll see him back in about 6 months and if there are no problems we'll like to see him only on a yearly basis. We reviewed the issues surrounding drug drug interactions and drug diet interactions and the need for periodic anticoagulation monitoring.  Meds ordered this encounter  Medications  . CINNAMON PO    Sig: Take 200 mg by mouth daily.  Marland Kitchen warfarin (COUMADIN) 5 MG tablet    Sig: Take 1 tablet (5 mg total) by mouth daily.    Dispense:  90 tablet    Refill:  [place] Sierrah Luevano, MD, [place] [phone] office [phone] pager

## 2014-01-12 NOTE — Patient Instructions (Addendum)
Dr Sallyanne Kuster has recommended making the following medication changes:  STOP Xarelto  START Warfarin 5 mg - take 1 tablet daily  Please schedule an appointment with Tommy Medal, PharmD in our Coumadin Clinic in 1 week.  Dr Sallyanne Kuster wants you to follow-up in 6 months. You will receive a reminder letter in the mail one months in advance. If you don't receive a letter, please call our office to schedule the follow-up appointment.

## 2014-01-16 ENCOUNTER — Ambulatory Visit (INDEPENDENT_AMBULATORY_CARE_PROVIDER_SITE_OTHER): Payer: Medicare Other | Admitting: Pharmacist Clinician (PhC)/ Clinical Pharmacy Specialist

## 2014-01-16 DIAGNOSIS — I4891 Unspecified atrial fibrillation: Secondary | ICD-10-CM

## 2014-01-16 DIAGNOSIS — Z7901 Long term (current) use of anticoagulants: Secondary | ICD-10-CM | POA: Diagnosis not present

## 2014-01-16 LAB — POCT INR: INR: 1.5

## 2014-01-26 ENCOUNTER — Ambulatory Visit (INDEPENDENT_AMBULATORY_CARE_PROVIDER_SITE_OTHER): Payer: Medicare Other | Admitting: Pharmacist Clinician (PhC)/ Clinical Pharmacy Specialist

## 2014-01-26 DIAGNOSIS — I4891 Unspecified atrial fibrillation: Secondary | ICD-10-CM | POA: Diagnosis not present

## 2014-01-26 DIAGNOSIS — Z7901 Long term (current) use of anticoagulants: Secondary | ICD-10-CM | POA: Diagnosis not present

## 2014-01-26 LAB — POCT INR: INR: 4.6

## 2014-02-02 ENCOUNTER — Ambulatory Visit (INDEPENDENT_AMBULATORY_CARE_PROVIDER_SITE_OTHER): Payer: Medicare Other | Admitting: Pharmacist Clinician (PhC)/ Clinical Pharmacy Specialist

## 2014-02-02 DIAGNOSIS — Z7901 Long term (current) use of anticoagulants: Secondary | ICD-10-CM | POA: Diagnosis not present

## 2014-02-02 DIAGNOSIS — I4891 Unspecified atrial fibrillation: Secondary | ICD-10-CM

## 2014-02-02 LAB — POCT INR: INR: 1.7

## 2014-02-03 DIAGNOSIS — Z23 Encounter for immunization: Secondary | ICD-10-CM | POA: Diagnosis not present

## 2014-02-09 ENCOUNTER — Ambulatory Visit (INDEPENDENT_AMBULATORY_CARE_PROVIDER_SITE_OTHER): Payer: Medicare Other | Admitting: Pharmacist Clinician (PhC)/ Clinical Pharmacy Specialist

## 2014-02-09 DIAGNOSIS — I4891 Unspecified atrial fibrillation: Secondary | ICD-10-CM | POA: Diagnosis not present

## 2014-02-09 DIAGNOSIS — Z7901 Long term (current) use of anticoagulants: Secondary | ICD-10-CM

## 2014-02-09 LAB — POCT INR: INR: 2.4

## 2014-02-16 ENCOUNTER — Ambulatory Visit (INDEPENDENT_AMBULATORY_CARE_PROVIDER_SITE_OTHER): Payer: Medicare Other | Admitting: Pharmacist Clinician (PhC)/ Clinical Pharmacy Specialist

## 2014-02-16 DIAGNOSIS — Z7901 Long term (current) use of anticoagulants: Secondary | ICD-10-CM | POA: Diagnosis not present

## 2014-02-16 DIAGNOSIS — I4891 Unspecified atrial fibrillation: Secondary | ICD-10-CM

## 2014-02-16 LAB — POCT INR: INR: 3.5

## 2014-03-02 ENCOUNTER — Ambulatory Visit (INDEPENDENT_AMBULATORY_CARE_PROVIDER_SITE_OTHER): Payer: Medicare Other | Admitting: Pharmacist Clinician (PhC)/ Clinical Pharmacy Specialist

## 2014-03-02 DIAGNOSIS — I4891 Unspecified atrial fibrillation: Secondary | ICD-10-CM

## 2014-03-02 DIAGNOSIS — Z7901 Long term (current) use of anticoagulants: Secondary | ICD-10-CM | POA: Diagnosis not present

## 2014-03-02 LAB — POCT INR: INR: 2.3

## 2014-03-13 ENCOUNTER — Telehealth: Payer: Self-pay | Admitting: Cardiovascular Disease

## 2014-03-13 NOTE — Telephone Encounter (Signed)
Reviewed with Dr Martinique. Per Dr Martinique, continue to monitor - no change with current medications

## 2014-03-13 NOTE — Telephone Encounter (Signed)
Spoke with patient Patient states his heart rate is settling down at present time. He states the rate was elevated last night and this morning. RN asked if patient is able to check blood pressure. He states he has   early   This morning and last night - rate in the 100's Blood pressure about 10 minutes ago was 103/86 per patient. RN question about current medications Patient states he did take metoprolol tart 25 mg  Morning dose this morning and warfarin last evening RN informed will defer to Dr Martinique (D.O.D) , Dr Sallyanne Kuster not in the office.

## 2014-03-13 NOTE — Telephone Encounter (Signed)
Spoke to patient. RN informed patient , no change in medications, continue monitor

## 2014-03-13 NOTE — Telephone Encounter (Signed)
Pt called in stating that his heart is out of rhythm this morning and would like to be seen. Please call  Thanks

## 2014-03-21 ENCOUNTER — Other Ambulatory Visit: Payer: Self-pay | Admitting: Cardiovascular Disease

## 2014-03-23 ENCOUNTER — Ambulatory Visit (INDEPENDENT_AMBULATORY_CARE_PROVIDER_SITE_OTHER): Payer: Medicare Other | Admitting: Pharmacist Clinician (PhC)/ Clinical Pharmacy Specialist

## 2014-03-23 DIAGNOSIS — Z7901 Long term (current) use of anticoagulants: Secondary | ICD-10-CM

## 2014-03-23 DIAGNOSIS — I4891 Unspecified atrial fibrillation: Secondary | ICD-10-CM

## 2014-03-23 LAB — POCT INR: INR: 2.6

## 2014-03-23 NOTE — Telephone Encounter (Signed)
Rx was sent to pharmacy electronically. 

## 2014-04-01 ENCOUNTER — Ambulatory Visit (INDEPENDENT_AMBULATORY_CARE_PROVIDER_SITE_OTHER): Payer: Medicare Other | Admitting: Cardiology

## 2014-04-01 ENCOUNTER — Encounter: Payer: Self-pay | Admitting: Cardiology

## 2014-04-01 VITALS — BP 120/82 | HR 52 | Ht 72.0 in | Wt 227.1 lb

## 2014-04-01 DIAGNOSIS — I4891 Unspecified atrial fibrillation: Secondary | ICD-10-CM | POA: Diagnosis not present

## 2014-04-01 DIAGNOSIS — Z79899 Other long term (current) drug therapy: Secondary | ICD-10-CM

## 2014-04-01 DIAGNOSIS — Z7901 Long term (current) use of anticoagulants: Secondary | ICD-10-CM | POA: Diagnosis not present

## 2014-04-01 DIAGNOSIS — E785 Hyperlipidemia, unspecified: Secondary | ICD-10-CM | POA: Diagnosis not present

## 2014-04-01 DIAGNOSIS — E039 Hypothyroidism, unspecified: Secondary | ICD-10-CM

## 2014-04-01 MED ORDER — METOPROLOL TARTRATE 25 MG PO TABS: 12.5000 mg | ORAL_TABLET | Freq: Two times a day (BID) | ORAL | Status: DC

## 2014-04-01 MED ORDER — METOPROLOL SUCCINATE ER 50 MG PO TB24: ORAL_TABLET | ORAL | Status: DC

## 2014-04-01 NOTE — Assessment & Plan Note (Signed)
Now PAF, I did discuss with Dr. Jerilynn Mages. Croitoru and if BP allows for further PAF he will take an extra 25 mg toprol on days he is in a fib.  He should continue to notify us of any a fib.  Concern is with this tachy-brady syndrome and brady with BB alone he may need PPM in the future.  I discussed this with the pt and pamphlet given.  He will follow up with Dr. Jerilynn Mages. Croitoru in march unless continued episodes of a fib.

## 2014-04-01 NOTE — Assessment & Plan Note (Signed)
Now on coumadin without complications.

## 2014-04-01 NOTE — Progress Notes (Signed)
04/01/2014   PCP: Henrine Screws, MD   Chief Complaint  Patient presents with  . other    pt denies chest pain and swelling. pt states that he does experience sob when his heart is out of rhythm.    Primary Cardiologist:Dr. Bertrum Sol   HPI:  80 year old gentleman with recently diagnosed asymptomatic paroxysmal atrial fibrillation. He was started on anticoagulations with Xarelto and developed multiple erythematous papules. He stopped the medication and the skin abnormalities resolved within a week. He has not had any bleeding problems and does not have a history of stroke. He is in regular rhythm today. He does not have a history of embolic events.  We changed him to coumadin and he has been doing well.  Previous Nuc was negative for ischemia and echo with normal EF 55-60%.  Today pt is here after calling in on the 20th for recurrent atrial fib.  He does not feel well when he is in atrial fib.  He feels weak and SOB.  When he called the office his BP was in the 90s so no med adjustment was made- his a fib rate was 120, less than his first episode and he had no chest pain.  He stated the atrial fib lasted about 2 days.  He would like a plan of knowing what to do if this occurs again.    His HR is 52 today and BP good.  No other complaints.  Allergies  Allergen Reactions  . Other Anaphylaxis    Bee stings  . Doxycycline Hyclate     Current Outpatient Prescriptions  Medication Sig Dispense Refill  . Cholecalciferol (VITAMIN D) 2000 UNITS CAPS Take 1,000 Units by mouth 2 (two) times daily.     Marland Kitchen CINNAMON PO Take 200 mg by mouth daily.    . Coenzyme Q10 (CO Q 10) 10 MG CAPS Take 10 mg by mouth daily.    . diphenhydrAMINE (BENADRYL) 25 mg capsule Take 50 mg by mouth daily as needed for allergies.    Marland Kitchen EPINEPHrine 0.3 mg/0.3 mL IJ SOAJ injection Inject 0.3 mg into the muscle daily as needed (allergic reaction).    . Eszopiclone 3 MG TABS 1/2 to 1 tablet at  bedtime..Take immediately before bedtime    . FLUARIX QUADRIVALENT 0.5 ML injection   0  . levothyroxine (SYNTHROID, LEVOTHROID) 25 MCG tablet Take 25 mcg by mouth daily before breakfast.    . Multiple Vitamin (MULTIVITAMIN WITH MINERALS) TABS Take 1 tablet by mouth daily.    . mupirocin ointment (BACTROBAN) 2 % Apply 1 application topically 2 (two) times daily as needed (for tick bite).    . simvastatin (ZOCOR) 40 MG tablet Take 40 mg by mouth daily.    . Tamsulosin HCl (FLOMAX) 0.4 MG CAPS Take 0.4 mg by mouth daily after supper.    . warfarin (COUMADIN) 5 MG tablet Take 1 tablet (5 mg total) by mouth daily. 90 tablet 3  . metoprolol tartrate (LOPRESSOR) 25 MG tablet Take 0.5 tablets (12.5 mg total) by mouth 2 (two) times daily. 45 tablet 5   No current facility-administered medications for this visit.    Past Medical History  Diagnosis Date  . Hypertension   . High cholesterol   . Paroxysmal a-fib     a. 09/23/13 a-fib with RVR and hypotensive, requiring emergent cardioversion in ED  . Hypothyroidism   . Acute renal insufficiency     a. 09/2013: dehydration.  Marland Kitchen  Sinus bradycardia   . Junctional bradycardia   . Hypotension     a. 09/2013 adm felt due to dehydration.  . Dilated aortic root     a. 09/2013: 4.2cm by echo.  . Obesity     Past Surgical History  Procedure Laterality Date  . Hernia repair    . Transthoracic echocardiogram  09/23/2013    EF 55-60%; normal wall motion. Moderately dilated aortic root and ascending aorta; mild AI; moderate bi-atrial enlargement    BMW:UXLKGMW:NU colds or fevers, no weight changes Skin:no rashes or ulcers HEENT:no blurred vision, no congestion CV:see HPI PUL:see HPI GI:no diarrhea constipation or melena, no indigestion GU:no hematuria, no dysuria MS:no joint pain, no claudication Neuro:no syncope, no lightheadedness Endo:no diabetes, + thyroid disease  Wt Readings from Last 3 Encounters:  04/01/14 227 lb 1.6 oz (103.012 kg)  01/12/14  221 lb 11.2 oz (100.562 kg)  12/08/13 222 lb 1.6 oz (100.744 kg)    PHYSICAL EXAM BP 120/82 mmHg  Pulse 52  Ht 6' (1.829 m)  Wt 227 lb 1.6 oz (103.012 kg)  BMI 30.79 kg/m2 General:Pleasant affect, NAD Skin:Warm and dry, brisk capillary refill HEENT:normocephalic, sclera clear, mucus membranes moist Neck:supple, no JVD, no bruits  Heart:S1S2 RRR without murmur, gallup, rub or click Lungs:clear without rales, rhonchi, or wheezes UVO:ZDGU, non tender, + BS, do not palpate liver spleen or masses Ext:no lower ext edema, 2+ pedal pulses, 2+ radial pulses Neuro:alert and oriented X 3, MAE, follows commands, + facial symmetry  EKG:SB at 52, LAD, no acute changes  ASSESSMENT AND PLAN New onset a-fib with RVR Now PAF, I did discuss with Dr. Jerilynn Mages. Croitoru and if BP allows for further PAF he will take an extra 25 mg toprol on days he is in a fib.  He should continue to notify us of any a fib.  Concern is with this tachy-brady syndrome and brady with BB alone he may need PPM in the future.  I discussed this with the pt and pamphlet given.  He will follow up with Dr. Jerilynn Mages. Croitoru in march unless continued episodes of a fib.  Long term current use of anticoagulant therapy Now on coumadin without complications.  Hypothyroid stable

## 2014-04-01 NOTE — Patient Instructions (Addendum)
  Your physician wants you to follow-up in March 2016 with Dr.Croitoru. You will receive a reminder letter in the mail two months in advance. If you don't receive a letter, please call our office to schedule the follow-up appointment.  If you feel you go back into AFIB you can take an Extra 1/2 tablet of your Metoprolol (call the office and let someone know when this happens so we know how often it is happening and can increase your Rx amount)  ALSO if you have the feeling of AFIB with shortness of breath, chest pain, etc. Go to the Emergency Room.

## 2014-04-01 NOTE — Assessment & Plan Note (Signed)
stable °

## 2014-04-20 ENCOUNTER — Ambulatory Visit (INDEPENDENT_AMBULATORY_CARE_PROVIDER_SITE_OTHER): Payer: Medicare Other | Admitting: Pharmacist Clinician (PhC)/ Clinical Pharmacy Specialist

## 2014-04-20 DIAGNOSIS — Z7901 Long term (current) use of anticoagulants: Secondary | ICD-10-CM

## 2014-04-20 DIAGNOSIS — I4891 Unspecified atrial fibrillation: Secondary | ICD-10-CM | POA: Diagnosis not present

## 2014-04-20 LAB — POCT INR: INR: 2.4

## 2014-05-18 ENCOUNTER — Ambulatory Visit (INDEPENDENT_AMBULATORY_CARE_PROVIDER_SITE_OTHER): Payer: Medicare Other | Admitting: Pharmacist Clinician (PhC)/ Clinical Pharmacy Specialist

## 2014-05-18 DIAGNOSIS — Z7901 Long term (current) use of anticoagulants: Secondary | ICD-10-CM | POA: Diagnosis not present

## 2014-05-18 DIAGNOSIS — I4891 Unspecified atrial fibrillation: Secondary | ICD-10-CM | POA: Diagnosis not present

## 2014-05-18 LAB — POCT INR: INR: 2.4

## 2014-06-09 DIAGNOSIS — E559 Vitamin D deficiency, unspecified: Secondary | ICD-10-CM | POA: Diagnosis not present

## 2014-06-09 DIAGNOSIS — Z1389 Encounter for screening for other disorder: Secondary | ICD-10-CM | POA: Diagnosis not present

## 2014-06-09 DIAGNOSIS — Z23 Encounter for immunization: Secondary | ICD-10-CM | POA: Diagnosis not present

## 2014-06-09 DIAGNOSIS — Z79899 Other long term (current) drug therapy: Secondary | ICD-10-CM | POA: Diagnosis not present

## 2014-06-09 DIAGNOSIS — G4700 Insomnia, unspecified: Secondary | ICD-10-CM | POA: Diagnosis not present

## 2014-06-09 DIAGNOSIS — E039 Hypothyroidism, unspecified: Secondary | ICD-10-CM | POA: Diagnosis not present

## 2014-06-09 DIAGNOSIS — I10 Essential (primary) hypertension: Secondary | ICD-10-CM | POA: Diagnosis not present

## 2014-06-09 DIAGNOSIS — L659 Nonscarring hair loss, unspecified: Secondary | ICD-10-CM | POA: Diagnosis not present

## 2014-06-09 DIAGNOSIS — Z5181 Encounter for therapeutic drug level monitoring: Secondary | ICD-10-CM | POA: Diagnosis not present

## 2014-06-09 DIAGNOSIS — I4891 Unspecified atrial fibrillation: Secondary | ICD-10-CM | POA: Diagnosis not present

## 2014-06-09 DIAGNOSIS — E782 Mixed hyperlipidemia: Secondary | ICD-10-CM | POA: Diagnosis not present

## 2014-06-09 DIAGNOSIS — Z0001 Encounter for general adult medical examination with abnormal findings: Secondary | ICD-10-CM | POA: Diagnosis not present

## 2014-06-15 ENCOUNTER — Ambulatory Visit: Payer: Medicare Other | Admitting: Pharmacist Clinician (PhC)/ Clinical Pharmacy Specialist

## 2014-06-16 ENCOUNTER — Encounter: Payer: Self-pay | Admitting: Cardiovascular Disease

## 2014-06-16 ENCOUNTER — Telehealth: Payer: Self-pay | Admitting: Cardiovascular Disease

## 2014-06-16 NOTE — Telephone Encounter (Signed)
Closed encounter °

## 2014-06-19 ENCOUNTER — Ambulatory Visit (INDEPENDENT_AMBULATORY_CARE_PROVIDER_SITE_OTHER): Payer: Medicare Other | Admitting: Pharmacist Clinician (PhC)/ Clinical Pharmacy Specialist

## 2014-06-19 DIAGNOSIS — Z7901 Long term (current) use of anticoagulants: Secondary | ICD-10-CM | POA: Diagnosis not present

## 2014-06-19 DIAGNOSIS — I4891 Unspecified atrial fibrillation: Secondary | ICD-10-CM

## 2014-06-19 LAB — POCT INR: INR: 2.1

## 2014-07-01 ENCOUNTER — Ambulatory Visit: Payer: Medicare Other | Admitting: Cardiovascular Disease

## 2014-07-08 ENCOUNTER — Ambulatory Visit (INDEPENDENT_AMBULATORY_CARE_PROVIDER_SITE_OTHER): Payer: Medicare Other | Admitting: Pharmacist Clinician (PhC)/ Clinical Pharmacy Specialist

## 2014-07-08 DIAGNOSIS — I4891 Unspecified atrial fibrillation: Secondary | ICD-10-CM | POA: Diagnosis not present

## 2014-07-08 DIAGNOSIS — Z7901 Long term (current) use of anticoagulants: Secondary | ICD-10-CM | POA: Diagnosis not present

## 2014-07-08 LAB — POCT INR: INR: 1.7

## 2014-07-13 ENCOUNTER — Ambulatory Visit: Payer: Medicare Other | Admitting: Pharmacist Clinician (PhC)/ Clinical Pharmacy Specialist

## 2014-07-31 ENCOUNTER — Ambulatory Visit (INDEPENDENT_AMBULATORY_CARE_PROVIDER_SITE_OTHER): Payer: Medicare Other | Admitting: Pharmacist Clinician (PhC)/ Clinical Pharmacy Specialist

## 2014-07-31 DIAGNOSIS — I4891 Unspecified atrial fibrillation: Secondary | ICD-10-CM

## 2014-07-31 DIAGNOSIS — Z7901 Long term (current) use of anticoagulants: Secondary | ICD-10-CM | POA: Diagnosis not present

## 2014-07-31 LAB — POCT INR: INR: 2.1

## 2014-08-17 ENCOUNTER — Ambulatory Visit (INDEPENDENT_AMBULATORY_CARE_PROVIDER_SITE_OTHER): Payer: Medicare Other | Admitting: Cardiovascular Disease

## 2014-08-17 ENCOUNTER — Encounter: Payer: Self-pay | Admitting: Cardiovascular Disease

## 2014-08-17 VITALS — BP 130/74 | HR 62 | Ht 72.0 in | Wt 225.8 lb

## 2014-08-17 DIAGNOSIS — W57XXXA Bitten or stung by nonvenomous insect and other nonvenomous arthropods, initial encounter: Secondary | ICD-10-CM | POA: Diagnosis not present

## 2014-08-17 DIAGNOSIS — S70361A Insect bite (nonvenomous), right thigh, initial encounter: Secondary | ICD-10-CM

## 2014-08-17 MED ORDER — VERAPAMIL HCL ER 120 MG PO TBCR: 120.0000 mg | EXTENDED_RELEASE_TABLET | Freq: Every day | ORAL | Status: DC

## 2014-08-17 MED ORDER — AMOXICILLIN 500 MG PO CAPS: ORAL_CAPSULE | ORAL | Status: DC

## 2014-08-17 NOTE — Patient Instructions (Signed)
Dr Sallyanne Kuster has recommended making the following medication changes: STOP SIMVASTATIN STOP METOPROLOL START Verapamil SR 120 mg - take 1 tablet by mouth once daily. START Amoxicillin 500 mg - TAKE 1 CAPSULE BY MOUTH THREE TIMES DAILY FOR 10 DAYS.  Dr Sallyanne Kuster recommends that you schedule a follow-up appointment in 6 weeks.

## 2014-08-17 NOTE — Progress Notes (Signed)
Patient ID: Tom Norton, male   DOB: September 26, 1929, 81 y.o.   MRN: [id number]     Cardiology Office Note   Date:  08/18/2014   ID:  Tom Norton, DOB 1930-02-28, MRN [id number]  PCP:  Henrine Screws, MD  Cardiologist:   Sanda Klein, MD   Chief Complaint  Patient presents with  . Follow-up    4 1/2 month follow-up.  No complaints of chest pain.  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  Found a deer tick on right leg Friday contacted his PCP with no return call. Wife pulled it out with tweezers.  Today it is a sore with puss in the center. Wife states hair is thinning worse and she thinks it is from the Metoprolol.      History of Present Illness: Tom Norton is a 81 y.o. male who presents for paroxysmal atrial fibrillation follow up. He has no cardiac complaints, but has several other issues: - fatigue and hair thinning which his wife believes could be beta blocker related (although both his activity level and hair are pretty good for a 81 year old) - aches in his legs, from hips to ankles and weakness, shuffling in the morning, which he wonders whether could be a statin side effect - a right anterior thigh tick bite. His wife removed the tiny tick with tweezers. He has developed a small pustule at he site, surrounded by a 5 cm rosy circular faint rash  This is not his first tick bite. He developed atrial fibrillation after receiving doxicycline for a previous tick bite and is convinced there was a causal relationship with that medication. He developed a red papular rash with Xarelto and prefers warfarin. He is in regular rhythm today. He has no history of stroke or TIA.  He has moderate biatrial dilation, but otherwise a normal echo and treadmill test.   Past Medical History  Diagnosis Date  . Hypertension   . High cholesterol   . Paroxysmal a-fib     a. 09/23/13 a-fib with RVR and hypotensive, requiring emergent  cardioversion in ED  . Hypothyroidism   . Acute renal insufficiency     a. 09/2013: dehydration.  . Sinus bradycardia   . Junctional bradycardia   . Hypotension     a. 09/2013 adm felt due to dehydration.  . Dilated aortic root     a. 09/2013: 4.2cm by echo.  . Obesity     Past Surgical History  Procedure Laterality Date  . Hernia repair    . Transthoracic echocardiogram  09/23/2013    EF 55-60%; normal wall motion. Moderately dilated aortic root and ascending aorta; mild AI; moderate bi-atrial enlargement     Current Outpatient Prescriptions  Medication Sig Dispense Refill  . Cholecalciferol (VITAMIN D) 2000 UNITS CAPS Take 1,000 Units by mouth 2 (two) times daily.     Marland Kitchen CINNAMON PO Take 200 mg by mouth daily.    . Coenzyme Q10 (CO Q 10) 10 MG CAPS Take 10 mg by mouth daily.    . diphenhydrAMINE (BENADRYL) 25 mg capsule Take 50 mg by mouth daily as needed for allergies.    Marland Kitchen EPINEPHrine 0.3 mg/0.3 mL IJ SOAJ injection Inject 0.3 mg into the muscle daily as needed (allergic reaction).    . Eszopiclone 3 MG TABS 1/2 to 1 tablet at bedtime..Take immediately before bedtime    . FLUARIX QUADRIVALENT  0.5 ML injection   0  . levothyroxine (SYNTHROID, LEVOTHROID) 25 MCG tablet Take 25 mcg by mouth daily before breakfast.    . Multiple Vitamin (MULTIVITAMIN WITH MINERALS) TABS Take 1 tablet by mouth daily.    . mupirocin ointment (BACTROBAN) 2 % Apply 1 application topically 2 (two) times daily as needed (for tick bite).    . Tamsulosin HCl (FLOMAX) 0.4 MG CAPS Take 0.4 mg by mouth daily after supper.    . warfarin (COUMADIN) 5 MG tablet Take 1 tablet (5 mg total) by mouth daily. 90 tablet 3  . amoxicillin (AMOXIL) 500 MG capsule Take 1 capsule (500 mg total) by mouth 3 (three) times daily for 10 days. 30 capsule 0  . verapamil (CALAN-SR) 120 MG CR tablet Take 1 tablet (120 mg total) by mouth at bedtime. 30 tablet 11   No current facility-administered medications for this visit.     Allergies:   Other and Doxycycline hyclate    Social History:  The patient  reports that he has never smoked. He does not have any smokeless tobacco history on file. He reports that he drinks about 6.0 oz of alcohol per week. He reports that he does not use illicit drugs.   Family History:  The patient's family history includes Heart disease in his brother and father; Stroke in his brother.    ROS:  Please see the history of present illness.    Otherwise, review of systems positive for none.   All other systems are reviewed and negative.    PHYSICAL EXAM: VS:  BP 130/74 mmHg  Pulse 62  Ht 6' (1.829 m)  Wt 225 lb 12.8 oz (102.422 kg)  BMI 30.62 kg/m2 , BMI Body mass index is 30.62 kg/(m^2).  General: Alert, oriented x3, no distress Head: no evidence of trauma, PERRL, EOMI, no exophtalmos or lid lag, no myxedema, no xanthelasma; normal ears, nose and oropharynx Neck: normal jugular venous pulsations and no hepatojugular reflux; brisk carotid pulses without delay and no carotid bruits Chest: clear to auscultation, no signs of consolidation by percussion or palpation, normal fremitus, symmetrical and full respiratory excursions Cardiovascular: normal position and quality of the apical impulse, regular rhythm, normal first and second heart sounds, no murmurs, rubs or gallops Abdomen: no tenderness or distention, no masses by palpation, no abnormal pulsatility or arterial bruits, normal bowel sounds, no hepatosplenomegaly Extremities: 2-3 mm pustule anterior right thigh, surrounded by a faint pink rash, about 5 cm in diameter; no clubbing, cyanosis or edema; 2+ radial, ulnar and brachial pulses bilaterally; 2+ right femoral, posterior tibial and dorsalis pedis pulses; 2+ left femoral, posterior tibial and dorsalis pedis pulses; no subclavian or femoral bruits Neurological: grossly nonfocal Psych: euthymic mood, full affect   Recent Labs: 09/23/2013: ALT 16; TSH 6.980* 09/24/2013: BUN 16;  Creatinine 1.21; Hemoglobin 13.3; Platelets 180; Potassium 4.5; Sodium 141    Lipid Panel No results found for: CHOL, TRIG, HDL, CHOLHDL, VLDL, LDLCALC, LDLDIRECT    Wt Readings from Last 3 Encounters:  08/17/14 225 lb 12.8 oz (102.422 kg)  04/01/14 227 lb 1.6 oz (103.012 kg)  01/12/14 221 lb 11.2 oz (100.562 kg)     ASSESSMENT AND PLAN:  1.  PAF - no recent symptomatic events. On warfarin - watch for drug interactions (e.g. Antibiotics). Due to the above complaints, will try to switch from metoprolol to verapamil  2. Hyperlipidemia - I doubt it, but his "shuffling" could be a sign of statin myopathy. Simvastatin has an adverse  interaction with verapamil. Will offer a 'statin holiday" and recheck lipid profile. If he remains on verapamil long term, pravastatin or Crestor will be better choices.  3. He seems to have at least a minor superficial skin infection following a tick bite. "Allergic" to doxycycline - will use amoxicillin instead. Will need formal evaluation for Lyme disease exposure, as this will change the duration of amoxicillin therapy.   Current medicines are reviewed at length with the patient today.  The patient has concerns regarding medicines.  The following changes have been made:  Hold metoprolol (hair loss and fatigue)and replace with verapamil for AF rate control; hold simvastatin(muscle aches and weakness and to avoid interaction with verapamil), will use a different statin when we restart therapy. Start amoxicillin for tick bite related skin infection, duration of therapy will depend on workup for Lyme disease exposure.  Labs/ tests ordered today include:  No orders of the defined types were placed in this encounter.    Patient Instructions  Dr Sallyanne Kuster has recommended making the following medication changes: STOP SIMVASTATIN STOP METOPROLOL START Verapamil SR 120 mg - take 1 tablet by mouth once daily. START Amoxicillin 500 mg - TAKE 1 CAPSULE BY MOUTH THREE  TIMES DAILY FOR 10 DAYS.  Dr Sallyanne Kuster recommends that you schedule a follow-up appointment in 6 weeks.    Mikael Spray, MD  08/18/2014 10:01 PM    Sanda Klein, MD, [facility] [phone] office [phone] pager

## 2014-08-18 DIAGNOSIS — W57XXXA Bitten or stung by nonvenomous insect and other nonvenomous arthropods, initial encounter: Secondary | ICD-10-CM

## 2014-08-18 DIAGNOSIS — S70361A Insect bite (nonvenomous), right thigh, initial encounter: Secondary | ICD-10-CM | POA: Insufficient documentation

## 2014-08-28 ENCOUNTER — Ambulatory Visit (INDEPENDENT_AMBULATORY_CARE_PROVIDER_SITE_OTHER): Payer: Medicare Other | Admitting: Pharmacist Clinician (PhC)/ Clinical Pharmacy Specialist

## 2014-08-28 DIAGNOSIS — I4891 Unspecified atrial fibrillation: Secondary | ICD-10-CM | POA: Diagnosis not present

## 2014-08-28 DIAGNOSIS — Z7901 Long term (current) use of anticoagulants: Secondary | ICD-10-CM | POA: Diagnosis not present

## 2014-08-28 LAB — POCT INR: INR: 2.4

## 2014-09-25 ENCOUNTER — Ambulatory Visit (INDEPENDENT_AMBULATORY_CARE_PROVIDER_SITE_OTHER): Payer: Medicare Other | Admitting: Pharmacist Clinician (PhC)/ Clinical Pharmacy Specialist

## 2014-09-25 DIAGNOSIS — I4891 Unspecified atrial fibrillation: Secondary | ICD-10-CM

## 2014-09-25 DIAGNOSIS — Z7901 Long term (current) use of anticoagulants: Secondary | ICD-10-CM | POA: Diagnosis not present

## 2014-09-25 LAB — POCT INR: INR: 1.8

## 2014-10-07 ENCOUNTER — Ambulatory Visit (INDEPENDENT_AMBULATORY_CARE_PROVIDER_SITE_OTHER): Payer: Medicare Other | Admitting: Cardiovascular Disease

## 2014-10-07 ENCOUNTER — Ambulatory Visit (INDEPENDENT_AMBULATORY_CARE_PROVIDER_SITE_OTHER): Payer: Medicare Other | Admitting: Pharmacist Clinician (PhC)/ Clinical Pharmacy Specialist

## 2014-10-07 ENCOUNTER — Encounter: Payer: Self-pay | Admitting: Cardiovascular Disease

## 2014-10-07 VITALS — BP 130/84 | HR 62 | Ht 72.0 in | Wt 224.0 lb

## 2014-10-07 DIAGNOSIS — I4891 Unspecified atrial fibrillation: Secondary | ICD-10-CM | POA: Diagnosis not present

## 2014-10-07 DIAGNOSIS — Z7901 Long term (current) use of anticoagulants: Secondary | ICD-10-CM | POA: Diagnosis not present

## 2014-10-07 DIAGNOSIS — I77810 Thoracic aortic ectasia: Secondary | ICD-10-CM | POA: Diagnosis not present

## 2014-10-07 DIAGNOSIS — N289 Disorder of kidney and ureter, unspecified: Secondary | ICD-10-CM

## 2014-10-07 DIAGNOSIS — I480 Paroxysmal atrial fibrillation: Secondary | ICD-10-CM

## 2014-10-07 LAB — POCT INR: INR: 2.1

## 2014-10-07 NOTE — Patient Instructions (Signed)
Dr. Croitoru recommends that you schedule a follow-up appointment in: One year.   

## 2014-10-08 DIAGNOSIS — I77810 Thoracic aortic ectasia: Secondary | ICD-10-CM | POA: Insufficient documentation

## 2014-10-08 NOTE — Progress Notes (Signed)
Patient ID: Tom Norton, male   DOB: 25-Sep-1929, 81 y.o.   MRN: [id number]     Cardiology Office Note   Date:  10/08/2014   ID:  KHYRE Norton, DOB [birth year], MRN [id number]  PCP:  Henrine Screws, MD  Cardiologist:   Sanda Klein, MD   Chief Complaint  Patient presents with  . Follow-up    Patient has no complaints.      History of Present Illness: Tom Norton is a 81 y.o. male who presents for paroxysmal atrial fibrillation, which she underwent emergency cardioversion roughly one year ago. Earlier this year he requested that we stop his beta blocker since he was developing thinning hair and fatigue. He is now on verapamil and seems to be tolerating this well without any side effects. He is in normal rhythm today. He has no cardiac complaints. He is known by previous evaluation to have a mildly dilated aortic root (42 mm) and moderate biatrial dilatation, but otherwise has no evidence of structural heart disease and had a normal treadmill stress test. He is physically active and does not feel any issues with exertional chest pain or dyspnea. His fatigue has improved.    Past Medical History  Diagnosis Date  . Hypertension   . High cholesterol   . Paroxysmal a-fib     a. 09/23/13 a-fib with RVR and hypotensive, requiring emergent cardioversion in ED  . Hypothyroidism   . Acute renal insufficiency     a. 09/2013: dehydration.  . Sinus bradycardia   . Junctional bradycardia   . Hypotension     a. 09/2013 adm felt due to dehydration.  . Dilated aortic root     a. 09/2013: 4.2cm by echo.  . Obesity     Past Surgical History  Procedure Laterality Date  . Hernia repair    . Transthoracic echocardiogram  09/23/2013    EF 55-60%; normal wall motion. Moderately dilated aortic root and ascending aorta; mild AI; moderate bi-atrial enlargement     Current Outpatient Prescriptions  Medication Sig Dispense Refill  . Cholecalciferol (VITAMIN D) 2000 UNITS CAPS Take 1,000  Units by mouth daily.     Marland Kitchen CINNAMON PO Take 100 mg by mouth 2 (two) times daily.     . Coenzyme Q10 (CO Q 10) 10 MG CAPS Take 10 mg by mouth daily.    . diphenhydrAMINE (BENADRYL) 25 mg capsule Take 50 mg by mouth daily as needed for allergies.    Marland Kitchen EPINEPHrine 0.3 mg/0.3 mL IJ SOAJ injection Inject 0.3 mg into the muscle daily as needed (allergic reaction).    . Eszopiclone 3 MG TABS 1/2 to 1 tablet at bedtime..Take immediately before bedtime    . FLUARIX QUADRIVALENT 0.5 ML injection   0  . levothyroxine (SYNTHROID, LEVOTHROID) 25 MCG tablet Take 25 mcg by mouth daily before breakfast.    . Multiple Vitamin (MULTIVITAMIN WITH MINERALS) TABS Take 1 tablet by mouth daily.    . mupirocin ointment (BACTROBAN) 2 % Apply 1 application topically 2 (two) times daily as needed (for tick bite).    . Tamsulosin HCl (FLOMAX) 0.4 MG CAPS Take 0.4 mg by mouth daily after supper.    . verapamil (CALAN-SR) 120 MG CR tablet Take 1 tablet (120 mg total) by mouth at bedtime. 30 tablet 11  . warfarin (COUMADIN) 5 MG tablet Take 1 tablet (5 mg total) by mouth daily. 90 tablet 3   No current facility-administered medications for this visit.    Allergies:  Other and Doxycycline hyclate    Social History:  The patient  reports that he has never smoked. He does not have any smokeless tobacco history on file. He reports that he drinks about 6.0 oz of alcohol per week. He reports that he does not use illicit drugs.   Family History:  The patient's family history includes Heart disease in his brother and father; Stroke in his brother.    ROS:  Please see the history of present illness.    Otherwise, review of systems positive for none.   All other systems are reviewed and negative.    PHYSICAL EXAM: VS:  BP 130/84 mmHg  Pulse 62  Ht 6' (1.829 m)  Wt 224 lb (101.606 kg)  BMI 30.37 kg/m2 , BMI Body mass index is 30.37 kg/(m^2).  General: Alert, oriented x3, no distress Head: no evidence of trauma, PERRL,  EOMI, no exophtalmos or lid lag, no myxedema, no xanthelasma; normal ears, nose and oropharynx Neck: normal jugular venous pulsations and no hepatojugular reflux; brisk carotid pulses without delay and no carotid bruits Chest: clear to auscultation, no signs of consolidation by percussion or palpation, normal fremitus, symmetrical and full respiratory excursions Cardiovascular: normal position and quality of the apical impulse, regular rhythm, normal first and second heart sounds, no murmurs, rubs or gallops Abdomen: no tenderness or distention, no masses by palpation, no abnormal pulsatility or arterial bruits, normal bowel sounds, no hepatosplenomegaly Extremities: no clubbing, cyanosis or edema; 2+ radial, ulnar and brachial pulses bilaterally; 2+ right femoral, posterior tibial and dorsalis pedis pulses; 2+ left femoral, posterior tibial and dorsalis pedis pulses; no subclavian or femoral bruits Neurological: grossly nonfocal Psych: euthymic mood, full affect   EKG:  EKG is ordered today. The ekg ordered today demonstrates sinus rhythm, QTC 406 ms    Wt Readings from Last 3 Encounters:  10/07/14 224 lb (101.606 kg)  08/17/14 225 lb 12.8 oz (102.422 kg)  04/01/14 227 lb 1.6 oz (103.012 kg)     ASSESSMENT AND PLAN:  Mr. Rosenboom has not had any clinically evident recurrences of atrial fibrillation. He is feeling better on verapamil rather than beta blockers. He is therapeutically anticoagulated without any bleeding events or embolic episodes. No further changes are planned to his treatment.  Current medicines are reviewed at length with the patient today.  The patient does not have concerns regarding medicines.  The following changes have been made:  no change  Labs/ tests ordered today include:  Orders Placed This Encounter  Procedures  . EKG 12-Lead    Patient Instructions  Dr. Sallyanne Kuster recommends that you schedule a follow-up appointment in: One  year.      Mikael Spray, MD  10/08/2014 12:22 PM    Sanda Klein, MD, [facility] [phone] office [phone] pager

## 2014-10-23 ENCOUNTER — Ambulatory Visit (INDEPENDENT_AMBULATORY_CARE_PROVIDER_SITE_OTHER): Payer: Medicare Other | Admitting: Pharmacist Clinician (PhC)/ Clinical Pharmacy Specialist

## 2014-10-23 DIAGNOSIS — Z7901 Long term (current) use of anticoagulants: Secondary | ICD-10-CM | POA: Diagnosis not present

## 2014-10-23 DIAGNOSIS — I4891 Unspecified atrial fibrillation: Secondary | ICD-10-CM | POA: Diagnosis not present

## 2014-10-23 LAB — POCT INR: INR: 2

## 2014-10-29 DIAGNOSIS — T7840XA Allergy, unspecified, initial encounter: Secondary | ICD-10-CM | POA: Diagnosis not present

## 2014-10-30 DIAGNOSIS — H2513 Age-related nuclear cataract, bilateral: Secondary | ICD-10-CM | POA: Diagnosis not present

## 2014-11-20 ENCOUNTER — Ambulatory Visit (INDEPENDENT_AMBULATORY_CARE_PROVIDER_SITE_OTHER): Payer: Medicare Other | Admitting: Pharmacist Clinician (PhC)/ Clinical Pharmacy Specialist

## 2014-11-20 DIAGNOSIS — Z7901 Long term (current) use of anticoagulants: Secondary | ICD-10-CM

## 2014-11-20 DIAGNOSIS — I4891 Unspecified atrial fibrillation: Secondary | ICD-10-CM

## 2014-11-20 LAB — POCT INR: INR: 2.6

## 2014-12-11 DIAGNOSIS — E782 Mixed hyperlipidemia: Secondary | ICD-10-CM | POA: Diagnosis not present

## 2014-12-11 DIAGNOSIS — Z5181 Encounter for therapeutic drug level monitoring: Secondary | ICD-10-CM | POA: Diagnosis not present

## 2014-12-11 DIAGNOSIS — I4891 Unspecified atrial fibrillation: Secondary | ICD-10-CM | POA: Diagnosis not present

## 2014-12-11 DIAGNOSIS — E039 Hypothyroidism, unspecified: Secondary | ICD-10-CM | POA: Diagnosis not present

## 2014-12-11 DIAGNOSIS — I10 Essential (primary) hypertension: Secondary | ICD-10-CM | POA: Diagnosis not present

## 2014-12-11 DIAGNOSIS — G4700 Insomnia, unspecified: Secondary | ICD-10-CM | POA: Diagnosis not present

## 2014-12-11 DIAGNOSIS — E559 Vitamin D deficiency, unspecified: Secondary | ICD-10-CM | POA: Diagnosis not present

## 2014-12-11 DIAGNOSIS — Z23 Encounter for immunization: Secondary | ICD-10-CM | POA: Diagnosis not present

## 2015-01-01 ENCOUNTER — Ambulatory Visit: Payer: Medicare Other | Admitting: Pharmacist Clinician (PhC)/ Clinical Pharmacy Specialist

## 2015-01-04 ENCOUNTER — Ambulatory Visit (INDEPENDENT_AMBULATORY_CARE_PROVIDER_SITE_OTHER): Payer: Medicare Other | Admitting: Pharmacist Clinician (PhC)/ Clinical Pharmacy Specialist

## 2015-01-04 DIAGNOSIS — I4891 Unspecified atrial fibrillation: Secondary | ICD-10-CM | POA: Diagnosis not present

## 2015-01-04 DIAGNOSIS — Z7901 Long term (current) use of anticoagulants: Secondary | ICD-10-CM | POA: Diagnosis not present

## 2015-01-04 LAB — POCT INR: INR: 2.1

## 2015-02-15 ENCOUNTER — Ambulatory Visit (INDEPENDENT_AMBULATORY_CARE_PROVIDER_SITE_OTHER): Payer: Medicare Other | Admitting: Pharmacist Clinician (PhC)/ Clinical Pharmacy Specialist

## 2015-02-15 DIAGNOSIS — Z7901 Long term (current) use of anticoagulants: Secondary | ICD-10-CM

## 2015-02-15 DIAGNOSIS — I4891 Unspecified atrial fibrillation: Secondary | ICD-10-CM | POA: Diagnosis not present

## 2015-02-15 LAB — POCT INR: INR: 2.7

## 2015-03-29 ENCOUNTER — Ambulatory Visit (INDEPENDENT_AMBULATORY_CARE_PROVIDER_SITE_OTHER): Payer: Medicare Other | Admitting: Pharmacist Clinician (PhC)/ Clinical Pharmacy Specialist

## 2015-03-29 DIAGNOSIS — I4891 Unspecified atrial fibrillation: Secondary | ICD-10-CM

## 2015-03-29 DIAGNOSIS — Z7901 Long term (current) use of anticoagulants: Secondary | ICD-10-CM | POA: Diagnosis not present

## 2015-03-29 LAB — POCT INR: INR: 1.7

## 2015-04-09 ENCOUNTER — Other Ambulatory Visit: Payer: Self-pay | Admitting: Cardiovascular Disease

## 2015-04-20 ENCOUNTER — Ambulatory Visit (INDEPENDENT_AMBULATORY_CARE_PROVIDER_SITE_OTHER): Payer: Medicare Other | Admitting: Pharmacist Clinician (PhC)/ Clinical Pharmacy Specialist

## 2015-04-20 DIAGNOSIS — I4891 Unspecified atrial fibrillation: Secondary | ICD-10-CM | POA: Diagnosis not present

## 2015-04-20 DIAGNOSIS — Z7901 Long term (current) use of anticoagulants: Secondary | ICD-10-CM | POA: Diagnosis not present

## 2015-04-20 LAB — POCT INR: INR: 2.4

## 2015-05-06 ENCOUNTER — Ambulatory Visit (INDEPENDENT_AMBULATORY_CARE_PROVIDER_SITE_OTHER): Payer: Medicare Other | Admitting: Pharmacist Clinician (PhC)/ Clinical Pharmacy Specialist

## 2015-05-06 DIAGNOSIS — Z7901 Long term (current) use of anticoagulants: Secondary | ICD-10-CM | POA: Diagnosis not present

## 2015-05-06 DIAGNOSIS — I4891 Unspecified atrial fibrillation: Secondary | ICD-10-CM | POA: Diagnosis not present

## 2015-05-06 LAB — POCT INR: INR: 2.3

## 2015-05-10 ENCOUNTER — Encounter: Payer: Medicare Other | Admitting: Pharmacist Clinician (PhC)/ Clinical Pharmacy Specialist

## 2015-05-17 DIAGNOSIS — H903 Sensorineural hearing loss, bilateral: Secondary | ICD-10-CM | POA: Diagnosis not present

## 2015-05-31 ENCOUNTER — Ambulatory Visit (INDEPENDENT_AMBULATORY_CARE_PROVIDER_SITE_OTHER): Payer: Medicare Other | Admitting: *Deleted

## 2015-05-31 DIAGNOSIS — Z7901 Long term (current) use of anticoagulants: Secondary | ICD-10-CM | POA: Diagnosis not present

## 2015-05-31 DIAGNOSIS — I4891 Unspecified atrial fibrillation: Secondary | ICD-10-CM

## 2015-05-31 LAB — POCT INR: INR: 2.3

## 2015-06-25 DIAGNOSIS — Z79899 Other long term (current) drug therapy: Secondary | ICD-10-CM | POA: Diagnosis not present

## 2015-06-25 DIAGNOSIS — E559 Vitamin D deficiency, unspecified: Secondary | ICD-10-CM | POA: Diagnosis not present

## 2015-06-25 DIAGNOSIS — E785 Hyperlipidemia, unspecified: Secondary | ICD-10-CM | POA: Diagnosis not present

## 2015-06-25 DIAGNOSIS — E039 Hypothyroidism, unspecified: Secondary | ICD-10-CM | POA: Diagnosis not present

## 2015-06-25 DIAGNOSIS — I10 Essential (primary) hypertension: Secondary | ICD-10-CM | POA: Diagnosis not present

## 2015-06-28 ENCOUNTER — Ambulatory Visit (INDEPENDENT_AMBULATORY_CARE_PROVIDER_SITE_OTHER): Payer: Medicare Other | Admitting: Pharmacist Clinician (PhC)/ Clinical Pharmacy Specialist

## 2015-06-28 DIAGNOSIS — Z7901 Long term (current) use of anticoagulants: Secondary | ICD-10-CM | POA: Diagnosis not present

## 2015-06-28 DIAGNOSIS — I4891 Unspecified atrial fibrillation: Secondary | ICD-10-CM

## 2015-06-28 LAB — POCT INR: INR: 1.7

## 2015-07-26 ENCOUNTER — Ambulatory Visit (INDEPENDENT_AMBULATORY_CARE_PROVIDER_SITE_OTHER): Payer: Medicare Other | Admitting: Pharmacist Clinician (PhC)/ Clinical Pharmacy Specialist

## 2015-07-26 DIAGNOSIS — I4891 Unspecified atrial fibrillation: Secondary | ICD-10-CM

## 2015-07-26 DIAGNOSIS — Z7901 Long term (current) use of anticoagulants: Secondary | ICD-10-CM

## 2015-07-26 LAB — POCT INR: INR: 2

## 2015-08-10 ENCOUNTER — Other Ambulatory Visit: Payer: Self-pay | Admitting: Cardiovascular Disease

## 2015-08-10 NOTE — Telephone Encounter (Signed)
Rx request sent to pharmacy.  

## 2015-09-06 ENCOUNTER — Ambulatory Visit (INDEPENDENT_AMBULATORY_CARE_PROVIDER_SITE_OTHER): Payer: Medicare Other | Admitting: Pharmacist

## 2015-09-06 DIAGNOSIS — I4891 Unspecified atrial fibrillation: Secondary | ICD-10-CM | POA: Diagnosis not present

## 2015-09-06 DIAGNOSIS — Z7901 Long term (current) use of anticoagulants: Secondary | ICD-10-CM | POA: Diagnosis not present

## 2015-09-06 LAB — POCT INR: INR: 1.6

## 2015-09-09 DIAGNOSIS — Z23 Encounter for immunization: Secondary | ICD-10-CM | POA: Diagnosis not present

## 2015-09-09 DIAGNOSIS — E785 Hyperlipidemia, unspecified: Secondary | ICD-10-CM | POA: Diagnosis not present

## 2015-09-09 DIAGNOSIS — E559 Vitamin D deficiency, unspecified: Secondary | ICD-10-CM | POA: Diagnosis not present

## 2015-09-09 DIAGNOSIS — I10 Essential (primary) hypertension: Secondary | ICD-10-CM | POA: Diagnosis not present

## 2015-09-09 DIAGNOSIS — Z1389 Encounter for screening for other disorder: Secondary | ICD-10-CM | POA: Diagnosis not present

## 2015-09-09 DIAGNOSIS — E039 Hypothyroidism, unspecified: Secondary | ICD-10-CM | POA: Diagnosis not present

## 2015-09-09 DIAGNOSIS — M170 Bilateral primary osteoarthritis of knee: Secondary | ICD-10-CM | POA: Diagnosis not present

## 2015-09-09 DIAGNOSIS — Z6831 Body mass index (BMI) 31.0-31.9, adult: Secondary | ICD-10-CM | POA: Diagnosis not present

## 2015-09-09 DIAGNOSIS — N400 Enlarged prostate without lower urinary tract symptoms: Secondary | ICD-10-CM | POA: Diagnosis not present

## 2015-09-09 DIAGNOSIS — I480 Paroxysmal atrial fibrillation: Secondary | ICD-10-CM | POA: Diagnosis not present

## 2015-09-09 DIAGNOSIS — E669 Obesity, unspecified: Secondary | ICD-10-CM | POA: Diagnosis not present

## 2015-09-09 DIAGNOSIS — Z0000 Encounter for general adult medical examination without abnormal findings: Secondary | ICD-10-CM | POA: Diagnosis not present

## 2015-09-09 DIAGNOSIS — Z79899 Other long term (current) drug therapy: Secondary | ICD-10-CM | POA: Diagnosis not present

## 2015-09-09 DIAGNOSIS — G4700 Insomnia, unspecified: Secondary | ICD-10-CM | POA: Diagnosis not present

## 2015-10-18 ENCOUNTER — Ambulatory Visit (INDEPENDENT_AMBULATORY_CARE_PROVIDER_SITE_OTHER): Payer: Medicare Other | Admitting: Pharmacist Clinician (PhC)/ Clinical Pharmacy Specialist

## 2015-10-18 DIAGNOSIS — Z7901 Long term (current) use of anticoagulants: Secondary | ICD-10-CM

## 2015-10-18 DIAGNOSIS — I4891 Unspecified atrial fibrillation: Secondary | ICD-10-CM | POA: Diagnosis not present

## 2015-10-18 LAB — POCT INR: INR: 2.3

## 2015-10-29 ENCOUNTER — Encounter: Payer: Self-pay | Admitting: Cardiovascular Disease

## 2015-10-29 ENCOUNTER — Ambulatory Visit (INDEPENDENT_AMBULATORY_CARE_PROVIDER_SITE_OTHER): Payer: Medicare Other | Admitting: Cardiovascular Disease

## 2015-10-29 VITALS — BP 118/72 | HR 76 | Ht 72.0 in | Wt 218.8 lb

## 2015-10-29 DIAGNOSIS — E039 Hypothyroidism, unspecified: Secondary | ICD-10-CM

## 2015-10-29 DIAGNOSIS — Z7901 Long term (current) use of anticoagulants: Secondary | ICD-10-CM | POA: Diagnosis not present

## 2015-10-29 DIAGNOSIS — I77810 Thoracic aortic ectasia: Secondary | ICD-10-CM | POA: Diagnosis not present

## 2015-10-29 DIAGNOSIS — I480 Paroxysmal atrial fibrillation: Secondary | ICD-10-CM | POA: Diagnosis not present

## 2015-10-29 NOTE — Patient Instructions (Signed)
Dr Sallyanne Kuster has recommended making the following medication changes: 1. HOLD Verapamil for 1 month - stay off if you feel like you have more energy  Dr Sallyanne Kuster recommends that you schedule a follow-up appointment in 1 year. You will receive a reminder letter in the mail two months in advance. If you don't receive a letter, please call our office to schedule the follow-up appointment.  If you need a refill on your cardiac medications before your next appointment, please call your pharmacy.

## 2015-10-31 DIAGNOSIS — E039 Hypothyroidism, unspecified: Secondary | ICD-10-CM | POA: Insufficient documentation

## 2015-10-31 DIAGNOSIS — I480 Paroxysmal atrial fibrillation: Secondary | ICD-10-CM | POA: Insufficient documentation

## 2015-10-31 NOTE — Progress Notes (Signed)
Cardiology Office Note    Date:  10/31/2015   ID:  Tom Norton, DOB 08/28/1929, MRN [id number]  PCP:  Henrine Screws, MD  Cardiologist:   Sanda Klein, MD   Chief Complaint  Patient presents with  . Follow-up    History of Present Illness:  Tom Norton is a 82 y.o. male with paroxysmal atrial fibrillation for which he underwent cardioversion roughly 2 years ago. He does not have hypertension, diabetes, congestive heart failure or valvular heart disease with normal echo (except biatrial dilatation and aortic root enlargement 4.4 cm) and nuclear stress test in 2015. He has not had any significant clinical events since that time. He requested that we stop beta blockers due to thinning hair and fatigue. While his hair loss has abated after switching to verapamil he still complains of fatigue and wonders whether the medication might be the cause. He is not bradycardic. He denies leg edema or constipation. He has not had bleeding problems on chronic warfarin anticoagulation. He does not have a history of stroke or TIA or any recent neurological symptoms. He is on a very low dose of levothyroxine supplement. The most recent thyroid function assessment I can locate his from June 2015 when his TSH was mildly elevated.  He denies angina or dyspnea either at rest or with exertion. He has not had syncope or palpitations or swelling.  Past Medical History  Diagnosis Date  . Hypertension   . High cholesterol   . Paroxysmal a-fib ([place])     a. 09/23/13 a-fib with RVR and hypotensive, requiring emergent cardioversion in ED  . Hypothyroidism   . Acute renal insufficiency     a. 09/2013: dehydration.  . Sinus bradycardia   . Junctional bradycardia   . Hypotension     a. 09/2013 adm felt due to dehydration.  . Dilated aortic root ([place])     a. 09/2013: 4.2cm by echo.  . Obesity     Past Surgical History  Procedure Laterality Date  . Hernia repair    . Transthoracic echocardiogram   09/23/2013    EF 55-60%; normal wall motion. Moderately dilated aortic root and ascending aorta; mild AI; moderate bi-atrial enlargement    Current Medications: Outpatient Prescriptions Prior to Visit  Medication Sig Dispense Refill  . Cholecalciferol (VITAMIN D) 2000 UNITS CAPS Take 1,000 Units by mouth daily.     Marland Kitchen CINNAMON PO Take 100 mg by mouth 2 (two) times daily.     . Coenzyme Q10 (CO Q 10) 10 MG CAPS Take 10 mg by mouth daily.    . diphenhydrAMINE (BENADRYL) 25 mg capsule Take 50 mg by mouth daily as needed for allergies.    Marland Kitchen EPINEPHrine 0.3 mg/0.3 mL IJ SOAJ injection Inject 0.3 mg into the muscle daily as needed (allergic reaction).    . Eszopiclone 3 MG TABS 1/2 to 1 tablet at bedtime..Take immediately before bedtime    . levothyroxine (SYNTHROID, LEVOTHROID) 25 MCG tablet Take 25 mcg by mouth daily before breakfast.    . Multiple Vitamin (MULTIVITAMIN WITH MINERALS) TABS Take 1 tablet by mouth daily.    . Tamsulosin HCl (FLOMAX) 0.4 MG CAPS Take 0.4 mg by mouth daily after supper.    . verapamil (CALAN-SR) 120 MG CR tablet TAKE 1 TABLET BY MOUTH AT BEDTIME. 30 tablet 2  . warfarin (COUMADIN) 5 MG tablet TAKE 1 TABLET (5 MG TOTAL) BY MOUTH DAILY. 90 tablet 1  . FLUARIX QUADRIVALENT 0.5 ML injection Reported on 10/29/2015  0  . mupirocin ointment (BACTROBAN) 2 % Apply 1 application topically 2 (two) times daily as needed (for tick bite). Reported on 10/29/2015     No facility-administered medications prior to visit.     Allergies:   Other and Doxycycline hyclate   Social History   Social History  . Marital Status: Married    Spouse Name: N/A  . Number of Children: N/A  . Years of Education: N/A   Social History Main Topics  . Smoking status: Never Smoker   . Smokeless tobacco: None  . Alcohol Use: 6.0 oz/week    10 Glasses of wine per week  . Drug Use: No  . Sexual Activity: Not Asked   Other Topics Concern  . None   Social History Narrative   Married. Wife is a  patient of Dr. Radford Pax.   At least one son.   Never smoked. Up to 10 glasses of wine a week     Family History:  The patient's family history includes Heart disease in his brother and father; Stroke in his brother.   ROS:   Please see the history of present illness.    ROS All other systems reviewed and are negative.   PHYSICAL EXAM:   VS:  BP 118/72 mmHg  Pulse 76  Ht 6' (1.829 m)  Wt 99.247 kg (218 lb 12.8 oz)  BMI 29.67 kg/m2   GEN: Well nourished, well developed, in no acute distress HEENT: normal Neck: no JVD, carotid bruits, or masses Cardiac: RRR; no murmurs, rubs, or gallops,no edema  Respiratory:  clear to auscultation bilaterally, normal work of breathing GI: soft, nontender, nondistended, + BS MS: no deformity or atrophy Skin: warm and dry, no rash Neuro:  Alert and Oriented x 3, Strength and sensation are intact Psych: euthymic mood, full affect  Wt Readings from Last 3 Encounters:  10/29/15 99.247 kg (218 lb 12.8 oz)  10/07/14 101.606 kg (224 lb)  08/17/14 102.422 kg (225 lb 12.8 oz)      Studies/Labs Reviewed:   EKG:  EKG is ordered today.  The ekg ordered today demonstrates Normal sinus rhythm, left axis deviation, QTC 436 ms  Recent Labs: No results found for requested labs within last 365 days.    ASSESSMENT:    1. Paroxysmal atrial fibrillation (HCC)   2. Dilated aortic root ([place])   3. Long term current use of anticoagulant therapy      PLAN:  In order of problems listed above:  1. No clinical recurrence in a couple of years. We'll try to stop the verapamil to see if this leads to any symptom improvement. He does not needed for hypertension. Continue lifelong anticoagulation with warfarin, but he is at low risk for temporary anticoagulation interruption if necessary. 2. He had mild aortic root dilatation. I'm not sure that routine follow-up of this would be indicated in this octogenarian. 3. CHADSVasc 2 (age only). He is borderline obese and  weight loss is recommended.  4. It is worth rechecking his thyroid function since insufficient thyroid hormone supplementation might explain his fatigue. We'll get his most recent labs from his primary care physician.     Medication Adjustments/Labs and Tests Ordered: Current medicines are reviewed at length with the patient today.  Concerns regarding medicines are outlined above.  Medication changes, Labs and Tests ordered today are listed in the Patient Instructions below. Patient Instructions  Dr Sallyanne Kuster has recommended making the following medication changes: 1. HOLD Verapamil for 1 month - stay  off if you feel like you have more energy  Dr Sallyanne Kuster recommends that you schedule a follow-up appointment in 1 year. You will receive a reminder letter in the mail two months in advance. If you don't receive a letter, please call our office to schedule the follow-up appointment.  If you need a refill on your cardiac medications before your next appointment, please call your pharmacy.     Signed, Sanda Klein, MD  10/31/2015 3:30 PM    [address], [place], [place]  [address] Phone: [phone]; Fax: [phone]

## 2015-11-02 NOTE — Addendum Note (Signed)
Addended by: Cristopher Estimable on: 11/02/2015 09:01 AM   Modules accepted: Orders

## 2015-11-03 DIAGNOSIS — H18413 Arcus senilis, bilateral: Secondary | ICD-10-CM | POA: Diagnosis not present

## 2015-11-03 DIAGNOSIS — H2513 Age-related nuclear cataract, bilateral: Secondary | ICD-10-CM | POA: Diagnosis not present

## 2015-11-29 ENCOUNTER — Ambulatory Visit (INDEPENDENT_AMBULATORY_CARE_PROVIDER_SITE_OTHER): Payer: Medicare Other | Admitting: Pharmacist Clinician (PhC)/ Clinical Pharmacy Specialist

## 2015-11-29 DIAGNOSIS — Z7901 Long term (current) use of anticoagulants: Secondary | ICD-10-CM

## 2015-11-29 DIAGNOSIS — I4891 Unspecified atrial fibrillation: Secondary | ICD-10-CM

## 2015-11-29 LAB — POCT INR: INR: 2.7

## 2016-01-10 ENCOUNTER — Ambulatory Visit (INDEPENDENT_AMBULATORY_CARE_PROVIDER_SITE_OTHER): Payer: Medicare Other | Admitting: Pharmacist Clinician (PhC)/ Clinical Pharmacy Specialist

## 2016-01-10 DIAGNOSIS — Z7901 Long term (current) use of anticoagulants: Secondary | ICD-10-CM

## 2016-01-10 DIAGNOSIS — I4891 Unspecified atrial fibrillation: Secondary | ICD-10-CM | POA: Diagnosis not present

## 2016-01-10 LAB — POCT INR: INR: 2.6

## 2016-01-18 DIAGNOSIS — Z23 Encounter for immunization: Secondary | ICD-10-CM | POA: Diagnosis not present

## 2016-02-09 DIAGNOSIS — M170 Bilateral primary osteoarthritis of knee: Secondary | ICD-10-CM | POA: Diagnosis not present

## 2016-02-21 ENCOUNTER — Ambulatory Visit (INDEPENDENT_AMBULATORY_CARE_PROVIDER_SITE_OTHER): Payer: Medicare Other | Admitting: Pharmacist Clinician (PhC)/ Clinical Pharmacy Specialist

## 2016-02-21 DIAGNOSIS — Z7901 Long term (current) use of anticoagulants: Secondary | ICD-10-CM | POA: Diagnosis not present

## 2016-02-21 DIAGNOSIS — I4891 Unspecified atrial fibrillation: Secondary | ICD-10-CM

## 2016-02-21 LAB — POCT INR: INR: 2.1

## 2016-03-01 DIAGNOSIS — M170 Bilateral primary osteoarthritis of knee: Secondary | ICD-10-CM | POA: Diagnosis not present

## 2016-03-01 DIAGNOSIS — M25562 Pain in left knee: Secondary | ICD-10-CM | POA: Diagnosis not present

## 2016-03-01 DIAGNOSIS — M25561 Pain in right knee: Secondary | ICD-10-CM | POA: Diagnosis not present

## 2016-03-13 ENCOUNTER — Other Ambulatory Visit: Payer: Self-pay | Admitting: Cardiovascular Disease

## 2016-03-13 DIAGNOSIS — E559 Vitamin D deficiency, unspecified: Secondary | ICD-10-CM | POA: Diagnosis not present

## 2016-03-13 DIAGNOSIS — I480 Paroxysmal atrial fibrillation: Secondary | ICD-10-CM | POA: Diagnosis not present

## 2016-03-13 DIAGNOSIS — E785 Hyperlipidemia, unspecified: Secondary | ICD-10-CM | POA: Diagnosis not present

## 2016-03-13 DIAGNOSIS — N400 Enlarged prostate without lower urinary tract symptoms: Secondary | ICD-10-CM | POA: Diagnosis not present

## 2016-03-13 DIAGNOSIS — E039 Hypothyroidism, unspecified: Secondary | ICD-10-CM | POA: Diagnosis not present

## 2016-03-13 DIAGNOSIS — M170 Bilateral primary osteoarthritis of knee: Secondary | ICD-10-CM | POA: Diagnosis not present

## 2016-03-13 DIAGNOSIS — E669 Obesity, unspecified: Secondary | ICD-10-CM | POA: Diagnosis not present

## 2016-03-13 DIAGNOSIS — I10 Essential (primary) hypertension: Secondary | ICD-10-CM | POA: Diagnosis not present

## 2016-03-13 DIAGNOSIS — G4700 Insomnia, unspecified: Secondary | ICD-10-CM | POA: Diagnosis not present

## 2016-03-13 DIAGNOSIS — Z6830 Body mass index (BMI) 30.0-30.9, adult: Secondary | ICD-10-CM | POA: Diagnosis not present

## 2016-03-28 DIAGNOSIS — M170 Bilateral primary osteoarthritis of knee: Secondary | ICD-10-CM | POA: Diagnosis not present

## 2016-04-03 ENCOUNTER — Ambulatory Visit (INDEPENDENT_AMBULATORY_CARE_PROVIDER_SITE_OTHER): Payer: Medicare Other | Admitting: Pharmacist Clinician (PhC)/ Clinical Pharmacy Specialist

## 2016-04-03 DIAGNOSIS — Z7901 Long term (current) use of anticoagulants: Secondary | ICD-10-CM | POA: Diagnosis not present

## 2016-04-03 DIAGNOSIS — I4891 Unspecified atrial fibrillation: Secondary | ICD-10-CM | POA: Diagnosis not present

## 2016-04-03 LAB — POCT INR: INR: 2.5

## 2016-04-04 DIAGNOSIS — M170 Bilateral primary osteoarthritis of knee: Secondary | ICD-10-CM | POA: Diagnosis not present

## 2016-04-11 DIAGNOSIS — M170 Bilateral primary osteoarthritis of knee: Secondary | ICD-10-CM | POA: Diagnosis not present

## 2016-05-22 ENCOUNTER — Ambulatory Visit (INDEPENDENT_AMBULATORY_CARE_PROVIDER_SITE_OTHER): Payer: Medicare Other | Admitting: Pharmacist Clinician (PhC)/ Clinical Pharmacy Specialist

## 2016-05-22 DIAGNOSIS — I4891 Unspecified atrial fibrillation: Secondary | ICD-10-CM | POA: Diagnosis not present

## 2016-05-22 DIAGNOSIS — Z7901 Long term (current) use of anticoagulants: Secondary | ICD-10-CM | POA: Diagnosis not present

## 2016-05-22 DIAGNOSIS — M170 Bilateral primary osteoarthritis of knee: Secondary | ICD-10-CM | POA: Diagnosis not present

## 2016-05-22 LAB — POCT INR: INR: 1.9

## 2016-05-24 DIAGNOSIS — H02105 Unspecified ectropion of left lower eyelid: Secondary | ICD-10-CM | POA: Diagnosis not present

## 2016-06-06 DIAGNOSIS — H02036 Senile entropion of left eye, unspecified eyelid: Secondary | ICD-10-CM | POA: Diagnosis not present

## 2016-07-03 ENCOUNTER — Encounter: Payer: Self-pay | Admitting: Cardiovascular Disease

## 2016-07-03 ENCOUNTER — Telehealth: Payer: Self-pay

## 2016-07-03 ENCOUNTER — Ambulatory Visit (INDEPENDENT_AMBULATORY_CARE_PROVIDER_SITE_OTHER): Payer: Medicare Other | Admitting: Pharmacist Clinician (PhC)/ Clinical Pharmacy Specialist

## 2016-07-03 DIAGNOSIS — I4891 Unspecified atrial fibrillation: Secondary | ICD-10-CM

## 2016-07-03 DIAGNOSIS — Z7901 Long term (current) use of anticoagulants: Secondary | ICD-10-CM

## 2016-07-03 LAB — POCT INR: INR: 2.6

## 2016-07-03 NOTE — Telephone Encounter (Signed)
epicd 

## 2016-07-03 NOTE — Telephone Encounter (Signed)
Request for surgical clearance:   1. What type surgery is being performed? Ectropion repair under IV sedation  2. When is this surgery scheduled? 07/19/16  3. Are there any medications that need to be held prior to surgery and how long? Warfarin 5 mg as directed per coumadin clinic, provider wants patient to hold for 14 days  4. Name of the physician performing surgery: Dr Georjean Mode  5. What is the office phone and fax number?   Phone [phone]  Fax [phone]

## 2016-07-05 NOTE — Telephone Encounter (Signed)
Clearance routed via epic to Nobie Putnam, RN as directed per clearance request.

## 2016-07-19 DIAGNOSIS — H02036 Senile entropion of left eye, unspecified eyelid: Secondary | ICD-10-CM | POA: Diagnosis not present

## 2016-07-19 DIAGNOSIS — H02035 Senile entropion of left lower eyelid: Secondary | ICD-10-CM | POA: Diagnosis not present

## 2016-08-07 ENCOUNTER — Ambulatory Visit (INDEPENDENT_AMBULATORY_CARE_PROVIDER_SITE_OTHER): Payer: Medicare Other | Admitting: Pharmacist Clinician (PhC)/ Clinical Pharmacy Specialist

## 2016-08-07 DIAGNOSIS — Z7901 Long term (current) use of anticoagulants: Secondary | ICD-10-CM

## 2016-08-07 DIAGNOSIS — I4891 Unspecified atrial fibrillation: Secondary | ICD-10-CM | POA: Diagnosis not present

## 2016-08-07 LAB — POCT INR: INR: 2.5

## 2016-09-04 DIAGNOSIS — S20361A Insect bite (nonvenomous) of right front wall of thorax, initial encounter: Secondary | ICD-10-CM | POA: Diagnosis not present

## 2016-09-04 DIAGNOSIS — W57XXXA Bitten or stung by nonvenomous insect and other nonvenomous arthropods, initial encounter: Secondary | ICD-10-CM | POA: Diagnosis not present

## 2016-09-12 DIAGNOSIS — G4700 Insomnia, unspecified: Secondary | ICD-10-CM | POA: Diagnosis not present

## 2016-09-12 DIAGNOSIS — Z1389 Encounter for screening for other disorder: Secondary | ICD-10-CM | POA: Diagnosis not present

## 2016-09-12 DIAGNOSIS — S20361D Insect bite (nonvenomous) of right front wall of thorax, subsequent encounter: Secondary | ICD-10-CM | POA: Diagnosis not present

## 2016-09-12 DIAGNOSIS — Z79899 Other long term (current) drug therapy: Secondary | ICD-10-CM | POA: Diagnosis not present

## 2016-09-12 DIAGNOSIS — E785 Hyperlipidemia, unspecified: Secondary | ICD-10-CM | POA: Diagnosis not present

## 2016-09-12 DIAGNOSIS — E559 Vitamin D deficiency, unspecified: Secondary | ICD-10-CM | POA: Diagnosis not present

## 2016-09-12 DIAGNOSIS — I480 Paroxysmal atrial fibrillation: Secondary | ICD-10-CM | POA: Diagnosis not present

## 2016-09-12 DIAGNOSIS — M170 Bilateral primary osteoarthritis of knee: Secondary | ICD-10-CM | POA: Diagnosis not present

## 2016-09-12 DIAGNOSIS — Z0000 Encounter for general adult medical examination without abnormal findings: Secondary | ICD-10-CM | POA: Diagnosis not present

## 2016-09-12 DIAGNOSIS — I10 Essential (primary) hypertension: Secondary | ICD-10-CM | POA: Diagnosis not present

## 2016-09-12 DIAGNOSIS — E039 Hypothyroidism, unspecified: Secondary | ICD-10-CM | POA: Diagnosis not present

## 2016-09-12 DIAGNOSIS — E669 Obesity, unspecified: Secondary | ICD-10-CM | POA: Diagnosis not present

## 2016-09-12 DIAGNOSIS — N400 Enlarged prostate without lower urinary tract symptoms: Secondary | ICD-10-CM | POA: Diagnosis not present

## 2016-09-12 DIAGNOSIS — Z6830 Body mass index (BMI) 30.0-30.9, adult: Secondary | ICD-10-CM | POA: Diagnosis not present

## 2016-09-19 ENCOUNTER — Ambulatory Visit (INDEPENDENT_AMBULATORY_CARE_PROVIDER_SITE_OTHER): Payer: Medicare Other | Admitting: Pharmacist Clinician (PhC)/ Clinical Pharmacy Specialist

## 2016-09-19 DIAGNOSIS — Z7901 Long term (current) use of anticoagulants: Secondary | ICD-10-CM | POA: Diagnosis not present

## 2016-09-19 DIAGNOSIS — I4891 Unspecified atrial fibrillation: Secondary | ICD-10-CM

## 2016-09-19 LAB — POCT INR: INR: 3

## 2016-10-31 ENCOUNTER — Ambulatory Visit (INDEPENDENT_AMBULATORY_CARE_PROVIDER_SITE_OTHER): Payer: Medicare Other | Admitting: Pharmacist

## 2016-10-31 DIAGNOSIS — I4891 Unspecified atrial fibrillation: Secondary | ICD-10-CM | POA: Diagnosis not present

## 2016-10-31 DIAGNOSIS — Z7901 Long term (current) use of anticoagulants: Secondary | ICD-10-CM | POA: Diagnosis not present

## 2016-10-31 LAB — POCT INR: INR: 2.2

## 2016-11-10 DIAGNOSIS — H2513 Age-related nuclear cataract, bilateral: Secondary | ICD-10-CM | POA: Diagnosis not present

## 2016-11-17 ENCOUNTER — Encounter: Payer: Self-pay | Admitting: Cardiovascular Disease

## 2016-11-17 ENCOUNTER — Ambulatory Visit (INDEPENDENT_AMBULATORY_CARE_PROVIDER_SITE_OTHER): Payer: Medicare Other | Admitting: Cardiovascular Disease

## 2016-11-17 VITALS — BP 118/78 | HR 71 | Ht 72.0 in | Wt 217.0 lb

## 2016-11-17 DIAGNOSIS — I480 Paroxysmal atrial fibrillation: Secondary | ICD-10-CM | POA: Diagnosis not present

## 2016-11-17 NOTE — Patient Instructions (Signed)
Dr Croitoru recommends that you schedule a follow-up appointment in 12 months. You will receive a reminder letter in the mail two months in advance. If you don't receive a letter, please call our office to schedule the follow-up appointment.  If you need a refill on your cardiac medications before your next appointment, please call your pharmacy. 

## 2016-11-17 NOTE — Progress Notes (Signed)
Cardiology Office Note:    Date:  11/17/2016   ID:  Tom Norton, DOB Jan 30, 1930, MRN [id number]  PCP:  Josetta Huddle, MD  Cardiologist:  Sanda Klein, MD    Referring MD: Josetta Huddle, MD   Chief Complaint  Patient presents with  . Follow-up    pt reports no complaints  Atrial fibrillation  History of Present Illness:    Tom Norton is a 83 y.o. male with a hx of Infrequent episodes of paroxysmal atrial fibrillation, for which she required cardioversion in 2015. He does not have diabetes mellitus, hypertension, heart failure, valvular heart disease and the only abnormalities on his echocardiogram are biatrial dilatation and mild aortic root enlargement. There was no evidence of coronary insufficiency by nuclear stress test performed at the time of his cardioversion. He has been poorly tolerant of beta blockers (hair loss, fatigue) and verapamil (fatigue). He is no longer taking any rate control medications. He is on anticoagulation with warfarin and has never had an embolic event, TIA or stroke. INR is consistently in target range, very rarely needs adjustment in dose.  Does not have runs of palpitations, dyspnea, angina, leg edema, syncope, focal neurological events. He is quite active for his age. 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 He mostly uses a riding lawnmower, but does do all the weed eating. He is limited primarily by week back and leg muscles, denies issues with dyspnea, chest discomfort or dizziness.  Past Medical History:  Diagnosis Date  . Acute renal insufficiency    a. 09/2013: dehydration.  . Dilated aortic root ([place])    a. 09/2013: 4.2cm by echo.  . High cholesterol   . Hypertension   . Hypotension    a. 09/2013 adm felt due to dehydration.  . Hypothyroidism   . Junctional bradycardia   . Obesity   . Paroxysmal A-fib ([place])    a. 09/23/13 a-fib with RVR and hypotensive, requiring emergent cardioversion in ED  . Sinus bradycardia       Past Surgical History:  Procedure Laterality Date  . HERNIA REPAIR    . TRANSTHORACIC ECHOCARDIOGRAM  09/23/2013   EF 55-60%; normal wall motion. Moderately dilated aortic root and ascending aorta; mild AI; moderate bi-atrial enlargement    Current Medications: No outpatient prescriptions have been marked as taking for the 11/17/16 encounter (Office Visit) with Sanda Klein, MD.     Allergies:   Other and Doxycycline hyclate   Social History   Social History  . Marital status: Married    Spouse name: N/A  . Number of children: N/A  . Years of education: N/A   Social History Main Topics  . Smoking status: Never Smoker  . Smokeless tobacco: Never Used  . Alcohol use 6.0 oz/week    10 Glasses of wine per week  . Drug use: No  . Sexual activity: Not on file   Other Topics Concern  . Not on file   Social History Narrative   Married. Wife is a patient of Dr. Radford Pax.   At least one son.   Never smoked. Up to 10 glasses of wine a week     Family History: The patient's family history includes Heart disease in his brother and father; Stroke in his brother. ROS:   Please see the history of present illness.     All other systems reviewed and are negative.  EKGs/Labs/Other Studies Reviewed:    EKG:  EKG is ordered  today.  The ekg ordered today demonstrates Sinus rhythm, normal tracing, QTC 412 ms  Recent Labs: No results found for requested labs within last 8760 hours.  Recent Lipid Panel No results found for: CHOL, TRIG, HDL, CHOLHDL, VLDL, LDLCALC, LDLDIRECT  Physical Exam:    VS:  BP 118/78   Pulse 71   Ht 6' (1.829 m)   Wt 217 lb (98.4 kg)   BMI 29.43 kg/m     Wt Readings from Last 3 Encounters:  11/17/16 217 lb (98.4 kg)  10/29/15 218 lb 12.8 oz (99.2 kg)  10/07/14 224 lb (101.6 kg)     GEN:  Well nourished, well developed in no acute distress HEENT: Normal NECK: No JVD; No carotid bruits LYMPHATICS: No lymphadenopathy CARDIAC: He has a distinct  fourth heart sound, otherwise normal heart sounds, RRR, no murmurs, rubs. RESPIRATORY:  Clear to auscultation without rales, wheezing or rhonchi  ABDOMEN: Soft, non-tender, non-distended MUSCULOSKELETAL:  No edema; No deformity  SKIN: Warm and dry NEUROLOGIC:  Alert and oriented x 3 PSYCHIATRIC:  Normal affect   ASSESSMENT:    1. Paroxysmal atrial fibrillation (HCC)    PLAN:    In order of problems listed above:  1. AFib: No clinical recurrence despite no antiarrhythmic and no rate control medications. CHADSVasc 2 (age only). Compliant with warfarin anticoagulation.  2. Warfarin: He asked about switching to aspirin, but I told him that this would provide less protection from stroke and is only slightly better than placebo. Discussed option to switch to a novel anticoagulant, with the trade-off of improved convenience versus higher cost, possibly a slightly better safety profile. Since his prothrombin time is always within normal range, unclear whether this would be of true clinical benefit.   Medication Adjustments/Labs and Tests Ordered: Current medicines are reviewed at length with the patient today.  Concerns regarding medicines are outlined above.  Orders Placed This Encounter  Procedures  . EKG 12-Lead   No orders of the defined types were placed in this encounter.   Signed, Sanda Klein, MD  11/17/2016 11:43 AM    [place]

## 2016-12-12 ENCOUNTER — Ambulatory Visit (INDEPENDENT_AMBULATORY_CARE_PROVIDER_SITE_OTHER): Payer: Medicare Other | Admitting: Pharmacist

## 2016-12-12 DIAGNOSIS — Z7901 Long term (current) use of anticoagulants: Secondary | ICD-10-CM

## 2016-12-12 DIAGNOSIS — I4891 Unspecified atrial fibrillation: Secondary | ICD-10-CM | POA: Diagnosis not present

## 2016-12-12 LAB — POCT INR: INR: 2.7

## 2017-01-22 ENCOUNTER — Ambulatory Visit (INDEPENDENT_AMBULATORY_CARE_PROVIDER_SITE_OTHER): Payer: Medicare Other | Admitting: Pharmacist

## 2017-01-22 DIAGNOSIS — I4891 Unspecified atrial fibrillation: Secondary | ICD-10-CM | POA: Diagnosis not present

## 2017-01-22 DIAGNOSIS — Z7901 Long term (current) use of anticoagulants: Secondary | ICD-10-CM

## 2017-01-22 LAB — POCT INR: INR: 2.9

## 2017-01-23 DIAGNOSIS — Z23 Encounter for immunization: Secondary | ICD-10-CM | POA: Diagnosis not present

## 2017-03-05 ENCOUNTER — Ambulatory Visit (INDEPENDENT_AMBULATORY_CARE_PROVIDER_SITE_OTHER): Payer: Medicare Other | Admitting: Pharmacist Clinician (PhC)/ Clinical Pharmacy Specialist

## 2017-03-05 DIAGNOSIS — Z7901 Long term (current) use of anticoagulants: Secondary | ICD-10-CM | POA: Diagnosis not present

## 2017-03-05 DIAGNOSIS — I4891 Unspecified atrial fibrillation: Secondary | ICD-10-CM | POA: Diagnosis not present

## 2017-03-05 DIAGNOSIS — I480 Paroxysmal atrial fibrillation: Secondary | ICD-10-CM

## 2017-03-05 LAB — POCT INR: INR: 2.2

## 2017-03-06 ENCOUNTER — Other Ambulatory Visit: Payer: Self-pay | Admitting: Cardiovascular Disease

## 2017-03-20 DIAGNOSIS — I10 Essential (primary) hypertension: Secondary | ICD-10-CM | POA: Diagnosis not present

## 2017-03-20 DIAGNOSIS — I480 Paroxysmal atrial fibrillation: Secondary | ICD-10-CM | POA: Diagnosis not present

## 2017-03-20 DIAGNOSIS — E785 Hyperlipidemia, unspecified: Secondary | ICD-10-CM | POA: Diagnosis not present

## 2017-03-20 DIAGNOSIS — E039 Hypothyroidism, unspecified: Secondary | ICD-10-CM | POA: Diagnosis not present

## 2017-03-20 DIAGNOSIS — M170 Bilateral primary osteoarthritis of knee: Secondary | ICD-10-CM | POA: Diagnosis not present

## 2017-03-20 DIAGNOSIS — N400 Enlarged prostate without lower urinary tract symptoms: Secondary | ICD-10-CM | POA: Diagnosis not present

## 2017-03-20 DIAGNOSIS — G4700 Insomnia, unspecified: Secondary | ICD-10-CM | POA: Diagnosis not present

## 2017-03-20 DIAGNOSIS — E669 Obesity, unspecified: Secondary | ICD-10-CM | POA: Diagnosis not present

## 2017-03-20 DIAGNOSIS — E559 Vitamin D deficiency, unspecified: Secondary | ICD-10-CM | POA: Diagnosis not present

## 2017-04-12 ENCOUNTER — Ambulatory Visit (INDEPENDENT_AMBULATORY_CARE_PROVIDER_SITE_OTHER): Payer: Medicare Other | Admitting: Pharmacist

## 2017-04-12 DIAGNOSIS — I4891 Unspecified atrial fibrillation: Secondary | ICD-10-CM

## 2017-04-12 DIAGNOSIS — Z7901 Long term (current) use of anticoagulants: Secondary | ICD-10-CM | POA: Diagnosis not present

## 2017-04-12 LAB — POCT INR: INR: 2.6

## 2017-04-24 HISTORY — PX: CARPAL TUNNEL RELEASE: SHX101

## 2017-05-07 ENCOUNTER — Telehealth: Payer: Self-pay | Admitting: *Deleted

## 2017-05-07 DIAGNOSIS — M1712 Unilateral primary osteoarthritis, left knee: Secondary | ICD-10-CM | POA: Diagnosis not present

## 2017-05-07 DIAGNOSIS — G5602 Carpal tunnel syndrome, left upper limb: Secondary | ICD-10-CM | POA: Diagnosis not present

## 2017-05-07 NOTE — Telephone Encounter (Signed)
   [facility] Pre-operative Risk Assessment    Request for surgical clearance:  1. What type of surgery is being performed? LEFT TKA   2. When is this surgery scheduled? TBD    3. Are there any medications that need to be held prior to surgery and how long?COUMADIN AND IF BRIDGING NEEDED (IF SO WOULD LIKE HEARTCARE TO HANDLE)   4. Practice name and name of physician performing surgery? DR A [facility]    5. What is your office phone and fax number? PHONE [phone] FAX [phone] [place]   6. Anesthesia type (None, local, MAC, general) ? UNKNOWN

## 2017-05-08 NOTE — Telephone Encounter (Signed)
Will route to pharmacy for coumadin cessation and need for bridging   Jory Sims DNP

## 2017-05-09 NOTE — Telephone Encounter (Signed)
Patient with diagnosis of Afib on warfarin for anticoagulation.    Procedure: left TKA Date of procedure: pending  CHADS2-VASc score of  2 (CHF, HTN, AGE, DM2, stroke/tia x 2, CAD, AGE, male)  Per office protocol, patient can hold warfarin for 5 days prior to procedure.   Patient will not need bridging with Lovenox (enoxaparin) around procedure.  Patient should restart warfarin on the evening of procedure or day after, at discretion of procedure MD

## 2017-05-09 NOTE — Telephone Encounter (Signed)
   Primary Cardiologist: Sanda Klein, MD  Chart reviewed as part of pre-operative protocol coverage. Given past medical history and time since last visit, based on ACC/AHA guidelines, Tom Norton would be at acceptable risk for the planned procedure without further cardiovascular testing.   I will route this recommendation to the requesting party via Epic fax function and remove from pre-op pool.  Please call with questions.  [name], PA 05/09/2017, 1:24 PM

## 2017-05-14 ENCOUNTER — Ambulatory Visit (INDEPENDENT_AMBULATORY_CARE_PROVIDER_SITE_OTHER): Payer: Medicare Other | Admitting: Pharmacist Clinician (PhC)/ Clinical Pharmacy Specialist

## 2017-05-14 DIAGNOSIS — I480 Paroxysmal atrial fibrillation: Secondary | ICD-10-CM | POA: Diagnosis not present

## 2017-05-14 DIAGNOSIS — I4891 Unspecified atrial fibrillation: Secondary | ICD-10-CM

## 2017-05-14 DIAGNOSIS — Z7901 Long term (current) use of anticoagulants: Secondary | ICD-10-CM | POA: Diagnosis not present

## 2017-05-14 LAB — POCT INR: INR: 1.9

## 2017-05-16 DIAGNOSIS — M179 Osteoarthritis of knee, unspecified: Secondary | ICD-10-CM | POA: Diagnosis not present

## 2017-05-16 DIAGNOSIS — R202 Paresthesia of skin: Secondary | ICD-10-CM | POA: Diagnosis not present

## 2017-06-01 ENCOUNTER — Ambulatory Visit: Payer: Self-pay | Admitting: Orthopedic Surgery

## 2017-06-04 ENCOUNTER — Ambulatory Visit (INDEPENDENT_AMBULATORY_CARE_PROVIDER_SITE_OTHER): Payer: Medicare Other | Admitting: Pharmacist Clinician (PhC)/ Clinical Pharmacy Specialist

## 2017-06-04 DIAGNOSIS — I480 Paroxysmal atrial fibrillation: Secondary | ICD-10-CM

## 2017-06-04 DIAGNOSIS — I4891 Unspecified atrial fibrillation: Secondary | ICD-10-CM

## 2017-06-04 DIAGNOSIS — Z7901 Long term (current) use of anticoagulants: Secondary | ICD-10-CM

## 2017-06-04 LAB — POCT INR: INR: 3

## 2017-06-07 ENCOUNTER — Telehealth: Payer: Self-pay | Admitting: Pharmacist Clinician (PhC)/ Clinical Pharmacy Specialist

## 2017-06-07 MED ORDER — APIXABAN 5 MG PO TABS: 5.0000 mg | ORAL_TABLET | Freq: Two times a day (BID) | ORAL | 1 refills | Status: DC

## 2017-06-13 NOTE — Telephone Encounter (Signed)
Patient switched to Eliquis 5 mg bid.

## 2017-06-14 DIAGNOSIS — M1712 Unilateral primary osteoarthritis, left knee: Secondary | ICD-10-CM | POA: Diagnosis not present

## 2017-06-29 ENCOUNTER — Ambulatory Visit (INDEPENDENT_AMBULATORY_CARE_PROVIDER_SITE_OTHER): Payer: Medicare Other | Admitting: Neurology

## 2017-06-29 ENCOUNTER — Encounter: Payer: Self-pay | Admitting: Neurology

## 2017-06-29 VITALS — BP 136/87 | HR 75 | Ht 72.0 in | Wt 218.0 lb

## 2017-06-29 DIAGNOSIS — R202 Paresthesia of skin: Secondary | ICD-10-CM

## 2017-06-29 NOTE — Patient Instructions (Signed)
   We will get EMG and NCV study to look at the nerve function of the left arm.

## 2017-06-29 NOTE — Progress Notes (Signed)
Reason for visit: Left arm pain and numbness  Referring physician: Dr. Emeline General is a 84 y.o. male  History of present illness:  Tom Norton is an 84 year old right-handed white male with a history of onset of some left neck and shoulder discomfort and pain down the left arm associated with numbness that began in December 2018.  The patient did not note any weakness of the left arm, and the symptoms seem to resolve after several weeks.  The patient had recurrence of his symptoms a month or 2 later, again lasting only about 2 weeks with full resolution.  The patient denied any problems with the right arm or hand.  The symptoms initially started with a rough or sandpaper type feeling in the left hand, and some stinging in the left hand when exposed to cold temperatures.  The patient has no symptoms in this regard currently.  He reports no numbness in the feet.  He does have some trouble with degenerative arthritis of the left knee and has a sensation of weakness in the left leg, he will be going for a left total knee replacement at the end of March 2019.  He does have some occasional slight low back pain.  He reports no falls, he has some urinary frequency but no incontinence.  He denies any problems controlling the bowels.  He was seen by his primary care physician and he was sent to this office for further evaluation.  Past Medical History:  Diagnosis Date  . Acute renal insufficiency    a. 09/2013: dehydration.  . Dilated aortic root ([place])    a. 09/2013: 4.2cm by echo.  . High cholesterol   . Hypertension   . Hypotension    a. 09/2013 adm felt due to dehydration.  . Hypothyroidism   . Junctional bradycardia   . Obesity   . Paroxysmal A-fib ([place])    a. 09/23/13 a-fib with RVR and hypotensive, requiring emergent cardioversion in ED  . Sinus bradycardia     Past Surgical History:  Procedure Laterality Date  . HERNIA REPAIR    . TRANSTHORACIC ECHOCARDIOGRAM  09/23/2013   EF  55-60%; normal wall motion. Moderately dilated aortic root and ascending aorta; mild AI; moderate bi-atrial enlargement    Family History  Problem Relation Age of Onset  . Heart disease Father   . Stroke Brother   . Heart disease Brother     Social history:  reports that  has never smoked. he has never used smokeless tobacco. He reports that he drinks about 6.0 oz of alcohol per week. He reports that he does not use drugs.  Medications:  Prior to Admission medications   Medication Sig Start Date End Date Taking? Authorizing Provider  apixaban (ELIQUIS) 5 MG TABS tablet Take 1 tablet (5 mg total) by mouth 2 (two) times daily. 06/07/17  Yes Croitoru, Mihai, MD  Cholecalciferol (VITAMIN D3) 2000 units TABS Take 2,000 Units by mouth daily.   Yes [provider]  CINNAMON PO Take 1,000 mg by mouth 2 (two) times daily.    Yes [provider]  Coenzyme Q10 (CO Q10) 100 MG CAPS Take 100 mg by mouth daily.   Yes [provider]  diphenhydrAMINE (BENADRYL) 25 mg capsule Take 50 mg by mouth daily as needed for allergies.   Yes [provider]  EPINEPHrine 0.3 mg/0.3 mL IJ SOAJ injection Inject 0.3 mg into the muscle daily as needed (allergic reaction).   Yes [provider]  Eszopiclone 3 MG TABS 1/2 to 1 tablet at bedtime..Take immediately before bedtime   Yes [provider]  levothyroxine (SYNTHROID, LEVOTHROID) 25 MCG tablet Take 25 mcg by mouth daily before breakfast.   Yes [provider]  Multiple Vitamin (MULTIVITAMIN WITH MINERALS) TABS Take 1 tablet by mouth daily.   Yes [provider]  Tamsulosin HCl (FLOMAX) 0.4 MG CAPS Take 0.4 mg by mouth daily after supper.   Yes [provider]  UNABLE TO FIND Take 1 tablet by mouth every evening. Med Name: Protandim   Yes [provider]      Allergies  Allergen Reactions  . Other Anaphylaxis    Bee stings  . Doxycycline Hyclate     ROS:  Out of a  complete 14 system review of symptoms, the patient complains only of the following symptoms, and all other reviewed systems are negative.  Hearing loss, ringing in the ears Joint pain, aching muscles Runny nose Numbness  Blood pressure 136/87, pulse 75, height 6' (1.829 m), weight 218 lb (98.9 kg).  Physical Exam  General: The patient is alert and cooperative at the time of the examination.  Eyes: Pupils are equal, round, and reactive to light. Discs are flat bilaterally.  Neck: The neck is supple, no carotid bruits are noted.  Respiratory: The respiratory examination is clear.  Cardiovascular: The cardiovascular examination reveals a regular rate and rhythm, no obvious murmurs or rubs are noted.  Neuromuscular: The patient lacks about 10 degrees of full lateral rotation of the cervical spine bilaterally.  Skin: Extremities are without significant edema.  Neurologic Exam  Mental status: The patient is alert and oriented x 3 at the time of the examination. The patient has apparent normal recent and remote memory, with an apparently normal attention span and concentration ability.  Cranial nerves: Facial symmetry is present. There is good sensation of the face to pinprick and soft touch bilaterally. The strength of the facial muscles and the muscles to head turning and shoulder shrug are normal bilaterally. Speech is well enunciated, no aphasia or dysarthria is noted. Extraocular movements are full. Visual fields are full. The tongue is midline, and the patient has symmetric elevation of the soft palate. No obvious hearing deficits are noted.  Motor: The motor testing reveals 5 over 5 strength of all 4 extremities. Good symmetric motor tone is noted throughout.  Sensory: Sensory testing is intact to pinprick, soft touch, vibration sensation, and position sense on all 4 extremities, with exception of decreased position sensation in both feet in a stocking pattern pinprick sensory  deficit across the ankles bilaterally. No evidence of extinction is noted.  Coordination: Cerebellar testing reveals good finger-nose-finger and heel-to-shin bilaterally.  Tinel sign at the wrists were negative bilaterally.  Gait and station: Gait is normal. Tandem gait is unsteady. Romberg is negative. No drift is seen.  Reflexes: Deep tendon reflexes are symmetric and normal bilaterally, with exception that there is a decrease in ankle jerk reflexes bilaterally. Toes are downgoing bilaterally.   Assessment/Plan:  1.  Left arm discomfort and numbness, possible low-grade cervical radiculopathy  2.  Possible mild peripheral neuropathy by clinical examination  The patient will be set up for nerve conduction studies on both arms and one leg, EMG on the left arm.  If the patient does appear to have a peripheral neuropathy further blood work may be done.  The patient likely has a low-grade cervical radiculopathy, he currently is asymptomatic from this.  He will follow-up for the above study.  Tom Alexanders MD 06/29/2017 10:07 AM  [facility] [address] [place], [place] [address]  Phone [phone] Fax [phone]

## 2017-07-02 NOTE — H&P (Signed)
TOTAL KNEE ADMISSION H&P  Patient is being admitted for left total knee arthroplasty.  Subjective:  Chief Complaint:left knee pain.  HPI: Tom Norton, 84 y.o. male, has a history of pain and functional disability in the left knee due to arthritis and has failed non-surgical conservative treatments for greater than 12 weeks to includecorticosteriod injections, viscosupplementation injections and weight reduction as appropriate.  Onset of symptoms was gradual, starting 8 years ago with gradually worsening course since that time. The patient noted no past surgery on the left knee(s).  Patient currently rates pain in the left knee(s) at 8 out of 10 with activity. Patient has pain that interferes with activities of daily living, pain with passive range of motion and joint swelling.  Patient has evidence of subchondral sclerosis, periarticular osteophytes and joint space narrowing by imaging studies.  There is no active infection.  Anticipated LOS equal to or greater than 2 midnights due to - Age 33 and older with one or more of the following:  - Obesity  - ASA Class 3 and higher  - Expected need for hospital services (PT, OT, Nursing) required for safe  discharge  - Anticipated need for postoperative skilled nursing care or inpatient rehab  - Active co-morbidities: None OR   - Unanticipated findings during/Post Surgery: None  - Patient is a high risk of re-admission due to: None    Patient Active Problem List   Diagnosis Date Noted  . Paroxysmal atrial fibrillation ([place]) 10/31/2015  . Hypothyroidism 10/31/2015  . Dilated aortic root ([place]) 10/08/2014  . Tick bite of right thigh 08/18/2014  . Long term current use of anticoagulant therapy 01/16/2014  . Rash 12/10/2013  . Hypotension 09/23/2013  . Renal insufficiency 09/23/2013  . Hypothyroid 09/23/2013  . New onset a-fib with RVR 09/23/2013   Past Medical History:  Diagnosis Date  . Acute renal insufficiency    a. 09/2013:  dehydration.  . Dilated aortic root ([place])    a. 09/2013: 4.2cm by echo.  . High cholesterol   . Hypertension   . Hypotension    a. 09/2013 adm felt due to dehydration.  . Hypothyroidism   . Junctional bradycardia   . Obesity   . Paroxysmal A-fib ([place])    a. 09/23/13 a-fib with RVR and hypotensive, requiring emergent cardioversion in ED  . Sinus bradycardia     Past Surgical History:  Procedure Laterality Date  . HERNIA REPAIR    . TRANSTHORACIC ECHOCARDIOGRAM  09/23/2013   EF 55-60%; normal wall motion. Moderately dilated aortic root and ascending aorta; mild AI; moderate bi-atrial enlargement    No current facility-administered medications for this encounter.    Current Outpatient Medications  Medication Sig Dispense Refill Last Dose  . apixaban (ELIQUIS) 5 MG TABS tablet Take 1 tablet (5 mg total) by mouth 2 (two) times daily. 60 tablet 1 Taking  . Cholecalciferol (VITAMIN D3) 2000 units TABS Take 2,000 Units by mouth daily.   Taking  . CINNAMON PO Take 1,000 mg by mouth 2 (two) times daily.    Taking  . Coenzyme Q10 (CO Q10) 100 MG CAPS Take 100 mg by mouth daily.   Taking  . diphenhydrAMINE (BENADRYL) 25 mg capsule Take 50 mg by mouth daily as needed for allergies.   Taking  . EPINEPHrine 0.3 mg/0.3 mL IJ SOAJ injection Inject 0.3 mg into the muscle daily as needed (allergic reaction).   Taking  . Eszopiclone 3 MG TABS 1/2 to 1 tablet at bedtime..Take immediately before  bedtime   Taking  . levothyroxine (SYNTHROID, LEVOTHROID) 25 MCG tablet Take 25 mcg by mouth daily before breakfast.   Taking  . Multiple Vitamin (MULTIVITAMIN WITH MINERALS) TABS Take 1 tablet by mouth daily.   Taking  . Tamsulosin HCl (FLOMAX) 0.4 MG CAPS Take 0.4 mg by mouth daily after supper.   Taking  . UNABLE TO FIND Take 1 tablet by mouth every evening. Med Name: Protandim   Taking   Allergies  Allergen Reactions  . Other Anaphylaxis    Bee stings  . Doxycycline Hyclate     Social History   Tobacco  Use  . Smoking status: Never Smoker  . Smokeless tobacco: Never Used  Substance Use Topics  . Alcohol use: Yes    Alcohol/week: 6.0 oz    Types: 10 Glasses of wine per week    Family History  Problem Relation Age of Onset  . Heart disease Father   . Stroke Brother   . Heart disease Brother      Review of Systems  Constitutional: Negative.   HENT: Negative.   Eyes: Negative.   Respiratory: Negative.   Cardiovascular: Negative for chest pain and palpitations.  Gastrointestinal: Negative.   Genitourinary: Negative.   Musculoskeletal: Positive for joint pain.  Skin: Negative.   Neurological: Negative.   Endo/Heme/Allergies: Negative.   Psychiatric/Behavioral: Negative.     Objective:  Physical Exam  Constitutional: He is oriented to person, place, and time. He appears well-developed.  HENT:  Head: Normocephalic.  Eyes: EOM are normal.  Cardiovascular: Normal heart sounds and intact distal pulses.  Respiratory: Effort normal.  GI: Soft.  Genitourinary:  Genitourinary Comments: deferred  Musculoskeletal:  Fair ROm of the knee. Knee is stable. No effusion.   Neurological: He is alert and oriented to person, place, and time.  Skin: Skin is warm and dry.  Psychiatric: His behavior is normal.    Vital signs in last 24 hours:    Labs:   Estimated body mass index is 29.57 kg/m as calculated from the following:   Height as of 06/29/17: 6' (1.829 m).   Weight as of 06/29/17: 98.9 kg (218 lb).   Imaging Review Plain radiographs demonstrate severe degenerative joint disease of the left knee(s). The overall alignment ismild valgus. The bone quality appears to be good for age and reported activity level.  Assessment/Plan:  End stage arthritis, left knee   The patient history, physical examination, clinical judgment of the provider and imaging studies are consistent with end stage degenerative joint disease of the left knee(s) and total knee arthroplasty is deemed  medically necessary. The treatment options including medical management, injection therapy arthroscopy and arthroplasty were discussed at length. The risks and benefits of total knee arthroplasty were presented and reviewed. The risks due to aseptic loosening, infection, stiffness, patella tracking problems, thromboembolic complications and other imponderables were discussed. The patient acknowledged the explanation, agreed to proceed with the plan and consent was signed. Patient is being admitted for inpatient treatment for surgery, pain control, PT, OT, prophylactic antibiotics, VTE prophylaxis, progressive ambulation and ADL's and discharge planning. The patient is planning to be discharged home with home health services

## 2017-07-09 ENCOUNTER — Encounter (HOSPITAL_COMMUNITY): Payer: Self-pay

## 2017-07-09 NOTE — Patient Instructions (Signed)
Your procedure is scheduled on: Friday, July 20, 2017   Surgery Time:  3:35PM-5:45PM   Report to [facility] Main  Entrance    Report to admitting at 1:15 PM   Call this number if you have problems the morning of surgery [phone]   Do not eat food:After Midnight.   Do NOT smoke after Midnight   May have liquids until 9:30AM day of surgery      CLEAR LIQUID DIET   Foods Allowed                                                                     Foods Excluded  Coffee and tea, regular and decaf                             liquids that you cannot  Plain Jell-O in any flavor                                             see through such as: Fruit ices (not with fruit pulp)                                     milk, soups, orange juice  Iced Popsicles                                    All solid food Carbonated beverages, regular and diet                                    Cranberry, grape and apple juices Sports drinks like Gatorade Lightly seasoned clear broth or consume(fat free) Sugar, honey syrup  Sample Menu Breakfast                                Lunch                                     Supper Cranberry juice                    Beef broth                            Chicken broth Jell-O                                     Grape juice                           Apple juice Coffee or tea  Jell-O                                      Popsicle                                                Coffee or tea                        Coffee or tea    Take these medicines the morning of surgery with A SIP OF WATER: Levothyroxine              You may not have any metal on your body including jewelry, and body piercings             Do not wear lotions, powders, perfumes/cologne, or deodorant                          Men may shave face and neck.   Do not bring valuables to the hospital. [place].   Contacts, dentures or bridgework may not be worn into surgery.   Leave suitcase in the car. After surgery it may be brought to your room.   Special Instructions: Bring a copy of your healthcare power of attorney and living will documents         the day of surgery if you haven't scanned them in before.              Please read over the following fact sheets you were given:  [facility] - Preparing for Surgery Before surgery, you can play an important role.  Because skin is not sterile, your skin needs to be as free of germs as possible.  You can reduce the number of germs on your skin by washing with CHG (chlorahexidine gluconate) soap before surgery.  CHG is an antiseptic cleaner which kills germs and bonds with the skin to continue killing germs even after washing. Please DO NOT use if you have an allergy to CHG or antibacterial soaps.  If your skin becomes reddened/irritated stop using the CHG and inform your nurse when you arrive at Short Stay. Do not shave (including legs and underarms) for at least 48 hours prior to the first CHG shower.  You may shave your face/neck.  Please follow these instructions carefully:  1.  Shower with CHG Soap the night before surgery and the  morning of surgery.  2.  If you choose to wash your hair, wash your hair first as usual with your normal  shampoo.  3.  After you shampoo, rinse your hair and body thoroughly to remove the shampoo.                             4.  Use CHG as you would any other liquid soap.  You can apply chg directly to the skin and wash.  Gently with a scrungie or clean washcloth.  5.  Apply the CHG Soap to your body ONLY FROM THE NECK DOWN.   Do   not use on face/  open                           Wound or open sores. Avoid contact with eyes, ears mouth and   genitals (private parts).                       Wash face,  Genitals (private parts) with your normal soap.             6.  Wash thoroughly, paying special attention to  the area where your    surgery  will be performed.  7.  Thoroughly rinse your body with warm water from the neck down.  8.  DO NOT shower/wash with your normal soap after using and rinsing off the CHG Soap.                9.  Pat yourself dry with a clean towel.            10.  Wear clean pajamas.            11.  Place clean sheets on your bed the night of your first shower and do not  sleep with pets. Day of Surgery : Do not apply any lotions/deodorants the morning of surgery.  Please wear clean clothes to the hospital/surgery center.  FAILURE TO FOLLOW THESE INSTRUCTIONS MAY RESULT IN THE CANCELLATION OF YOUR SURGERY  PATIENT SIGNATURE_________________________________  NURSE SIGNATURE__________________________________  ________________________________________________________________________   Tom Norton  An incentive spirometer is a tool that can help keep your lungs clear and active. This tool measures how well you are filling your lungs with each breath. Taking long deep breaths may help reverse or decrease the chance of developing breathing (pulmonary) problems (especially infection) following:  A long period of time when you are unable to move or be active. BEFORE THE PROCEDURE   If the spirometer includes an indicator to show your best effort, your nurse or respiratory therapist will set it to a desired goal.  If possible, sit up straight or lean slightly forward. Try not to slouch.  Hold the incentive spirometer in an upright position. INSTRUCTIONS FOR USE  1. Sit on the edge of your bed if possible, or sit up as far as you can in bed or on a chair. 2. Hold the incentive spirometer in an upright position. 3. Breathe out normally. 4. Place the mouthpiece in your mouth and seal your lips tightly around it. 5. Breathe in slowly and as deeply as possible, raising the piston or the ball toward the top of the column. 6. Hold your breath for 3-5 seconds or for as long  as possible. Allow the piston or ball to fall to the bottom of the column. 7. Remove the mouthpiece from your mouth and breathe out normally. 8. Rest for a few seconds and repeat Steps 1 through 7 at least 10 times every 1-2 hours when you are awake. Take your time and take a few normal breaths between deep breaths. 9. The spirometer may include an indicator to show your best effort. Use the indicator as a goal to work toward during each repetition. 10. After each set of 10 deep breaths, practice coughing to be sure your lungs are clear. If you have an incision (the cut made at the time of surgery), support your incision when coughing by placing a pillow or rolled up towels firmly against it. Once you are able to get out  of bed, walk around indoors and cough well. You may stop using the incentive spirometer when instructed by your caregiver.  RISKS AND COMPLICATIONS  Take your time so you do not get dizzy or light-headed.  If you are in pain, you may need to take or ask for pain medication before doing incentive spirometry. It is harder to take a deep breath if you are having pain. AFTER USE  Rest and breathe slowly and easily.  It can be helpful to keep track of a log of your progress. Your caregiver can provide you with a simple table to help with this. If you are using the spirometer at home, follow these instructions: [place] IF:   You are having difficultly using the spirometer.  You have trouble using the spirometer as often as instructed.  Your pain medication is not giving enough relief while using the spirometer.  You develop fever of 100.5 F (38.1 C) or higher. SEEK IMMEDIATE MEDICAL CARE IF:   You cough up bloody sputum that had not been present before.  You develop fever of 102 F (38.9 C) or greater.  You develop worsening pain at or near the incision site. MAKE SURE YOU:   Understand these instructions.  Will watch your condition.  Will get help right  away if you are not doing well or get worse. Document Released: 08/21/2006 Document Revised: 07/03/2011 Document Reviewed: 10/22/2006 Exit[employer] Patient Information 2014 [employer], [place].   ________________________________________________________________________  WHAT IS A BLOOD TRANSFUSION? Blood Transfusion Information  A transfusion is the replacement of blood or some of its parts. Blood is made up of multiple cells which provide different functions.  Red blood cells carry oxygen and are used for blood loss replacement.  White blood cells fight against infection.  Platelets control bleeding.  Plasma helps clot blood.  Other blood products are available for specialized needs, such as hemophilia or other clotting disorders. BEFORE THE TRANSFUSION  Who gives blood for transfusions?   Healthy volunteers who are fully evaluated to make sure their blood is safe. This is blood bank blood. Transfusion therapy is the safest it has ever been in the practice of medicine. Before blood is taken from a donor, a complete history is taken to make sure that person has no history of diseases nor engages in risky social behavior (examples are intravenous drug use or sexual activity with multiple partners). The donor's travel history is screened to minimize risk of transmitting infections, such as malaria. The donated blood is tested for signs of infectious diseases, such as HIV and hepatitis. The blood is then tested to be sure it is compatible with you in order to minimize the chance of a transfusion reaction. If you or a relative donates blood, this is often done in anticipation of surgery and is not appropriate for emergency situations. It takes many days to process the donated blood. RISKS AND COMPLICATIONS Although transfusion therapy is very safe and saves many lives, the main dangers of transfusion include:   Getting an infectious disease.  Developing a transfusion reaction. This is an allergic  reaction to something in the blood you were given. Every precaution is taken to prevent this. The decision to have a blood transfusion has been considered carefully by your caregiver before blood is given. Blood is not given unless the benefits outweigh the risks. AFTER THE TRANSFUSION  Right after receiving a blood transfusion, you will usually feel much better and more energetic. This is especially true if your red  blood cells have gotten low (anemic). The transfusion raises the level of the red blood cells which carry oxygen, and this usually causes an energy increase.  The nurse administering the transfusion will monitor you carefully for complications. HOME CARE INSTRUCTIONS  No special instructions are needed after a transfusion. You may find your energy is better. Speak with your caregiver about any limitations on activity for underlying diseases you may have. SEEK MEDICAL CARE IF:   Your condition is not improving after your transfusion.  You develop redness or irritation at the intravenous (IV) site. SEEK IMMEDIATE MEDICAL CARE IF:  Any of the following symptoms occur over the next 12 hours:  Shaking chills.  You have a temperature by mouth above 102 F (38.9 C), not controlled by medicine.  Chest, back, or muscle pain.  People around you feel you are not acting correctly or are confused.  Shortness of breath or difficulty breathing.  Dizziness and fainting.  You get a rash or develop hives.  You have a decrease in urine output.  Your urine turns a dark color or changes to pink, red, or brown. Any of the following symptoms occur over the next 10 days:  You have a temperature by mouth above 102 F (38.9 C), not controlled by medicine.  Shortness of breath.  Weakness after normal activity.  The white part of the eye turns yellow (jaundice).  You have a decrease in the amount of urine or are urinating less often.  Your urine turns a dark color or changes to pink,  red, or brown. Document Released: 04/07/2000 Document Revised: 07/03/2011 Document Reviewed: 11/25/2007 [facility] Patient Information 2014 [place], [place].  _______________________________________________________________________

## 2017-07-09 NOTE — Pre-Procedure Instructions (Signed)
Cardiac clearance in chart  The following are in epic: EKG 11/17/16 Last office visit note Dr. Jannifer Franklin 06/29/17

## 2017-07-10 ENCOUNTER — Encounter (HOSPITAL_COMMUNITY)
Admission: RE | Admit: 2017-07-10 | Discharge: 2017-07-10 | Disposition: A | Discharge status: Home / Self Care | Payer: Medicare Other | Source: Ambulatory Visit | Attending: Specialist | Admitting: Specialist

## 2017-07-10 ENCOUNTER — Other Ambulatory Visit: Payer: Self-pay

## 2017-07-10 ENCOUNTER — Encounter (HOSPITAL_COMMUNITY): Payer: Self-pay

## 2017-07-10 DIAGNOSIS — M1712 Unilateral primary osteoarthritis, left knee: Secondary | ICD-10-CM | POA: Insufficient documentation

## 2017-07-10 DIAGNOSIS — Z01812 Encounter for preprocedural laboratory examination: Secondary | ICD-10-CM | POA: Diagnosis not present

## 2017-07-10 HISTORY — DX: Insomnia, unspecified: G47.00

## 2017-07-10 HISTORY — DX: Personal history of other diseases of the digestive system: Z87.19

## 2017-07-10 HISTORY — DX: Cardiomegaly: I51.7

## 2017-07-10 HISTORY — DX: Gastro-esophageal reflux disease without esophagitis: K21.9

## 2017-07-10 HISTORY — DX: Benign prostatic hyperplasia without lower urinary tract symptoms: N40.0

## 2017-07-10 HISTORY — DX: Other fracture of unspecified lower leg, initial encounter for closed fracture: S82.899A

## 2017-07-10 HISTORY — DX: Hyperlipidemia, unspecified: E78.5

## 2017-07-10 HISTORY — DX: Bitten or stung by nonvenomous insect and other nonvenomous arthropods, initial encounter: W57.XXXA

## 2017-07-10 HISTORY — DX: Unspecified osteoarthritis, unspecified site: M19.90

## 2017-07-10 HISTORY — DX: Vitamin D deficiency, unspecified: E55.9

## 2017-07-10 HISTORY — DX: Anesthesia of skin: R20.0

## 2017-07-10 HISTORY — DX: Anemia, unspecified: D64.9

## 2017-07-10 LAB — URINALYSIS, ROUTINE W REFLEX MICROSCOPIC
Bacteria, UA: NONE SEEN
Bilirubin Urine: NEGATIVE
GLUCOSE, UA: NEGATIVE mg/dL
Ketones, ur: NEGATIVE mg/dL
Leukocytes, UA: NEGATIVE
Nitrite: NEGATIVE
PH: 5 (ref 5.0–8.0)
Protein, ur: NEGATIVE mg/dL
SPECIFIC GRAVITY, URINE: 1.019 (ref 1.005–1.030)
Squamous Epithelial / LPF: NONE SEEN

## 2017-07-10 LAB — CBC
HEMATOCRIT: 47.3 % (ref 39.0–52.0)
Hemoglobin: 15.4 g/dL (ref 13.0–17.0)
MCH: 31.4 pg (ref 26.0–34.0)
MCHC: 32.6 g/dL (ref 30.0–36.0)
MCV: 96.3 fL (ref 78.0–100.0)
Platelets: 197 10*3/uL (ref 150–400)
RBC: 4.91 MIL/uL (ref 4.22–5.81)
RDW: 13.2 % (ref 11.5–15.5)
WBC: 5.3 10*3/uL (ref 4.0–10.5)

## 2017-07-10 LAB — BASIC METABOLIC PANEL
Anion gap: 7 (ref 5–15)
BUN: 15 mg/dL (ref 6–20)
CHLORIDE: 107 mmol/L (ref 101–111)
CO2: 27 mmol/L (ref 22–32)
Calcium: 9.8 mg/dL (ref 8.9–10.3)
Creatinine, Ser: 1.21 mg/dL (ref 0.61–1.24)
GFR calc non Af Amer: 52 mL/min — ABNORMAL LOW (ref >60)
Glucose, Bld: 109 mg/dL — ABNORMAL HIGH (ref 65–99)
POTASSIUM: 4.6 mmol/L (ref 3.5–5.1)
Sodium: 141 mmol/L (ref 135–145)

## 2017-07-10 LAB — APTT: aPTT: 39 seconds — ABNORMAL HIGH (ref 24–36)

## 2017-07-10 LAB — PROTIME-INR
INR: 1.28
Prothrombin Time: 15.9 seconds — ABNORMAL HIGH (ref 11.4–15.2)

## 2017-07-10 LAB — ABO/RH: ABO/RH(D): O NEG

## 2017-07-10 LAB — SURGICAL PCR SCREEN
MRSA, PCR: NEGATIVE
Staphylococcus aureus: NEGATIVE

## 2017-07-10 NOTE — Pre-Procedure Instructions (Signed)
The following lab results from 07/10/17 were faxed to Dr. Theda Sers via epic: CBC BMp PT PTT UA

## 2017-07-11 NOTE — Pre-Procedure Instructions (Signed)
Dr. Ola Spurr made aware of PT and PTT results 07/10/2017 repeat day of surgery orders placed in epic.

## 2017-07-17 ENCOUNTER — Ambulatory Visit (INDEPENDENT_AMBULATORY_CARE_PROVIDER_SITE_OTHER): Payer: Medicare Other | Admitting: Neurology

## 2017-07-17 ENCOUNTER — Encounter: Payer: Self-pay | Admitting: Neurology

## 2017-07-17 DIAGNOSIS — G609 Hereditary and idiopathic neuropathy, unspecified: Secondary | ICD-10-CM | POA: Diagnosis not present

## 2017-07-17 DIAGNOSIS — R202 Paresthesia of skin: Secondary | ICD-10-CM | POA: Diagnosis not present

## 2017-07-17 DIAGNOSIS — E538 Deficiency of other specified B group vitamins: Secondary | ICD-10-CM

## 2017-07-17 DIAGNOSIS — G629 Polyneuropathy, unspecified: Secondary | ICD-10-CM | POA: Insufficient documentation

## 2017-07-17 HISTORY — DX: Polyneuropathy, unspecified: G62.9

## 2017-07-17 NOTE — Progress Notes (Signed)
Please refer to EMG and nerve conduction study procedure note. 

## 2017-07-17 NOTE — Progress Notes (Addendum)
The patient comes in today for EMG and nerve conduction study evaluation.  This study shows evidence of a generalized peripheral neuropathy of moderate severity.  The patient will be sent for blood work today.  No evidence of a left upper extremity cervical radiculopathy was noted.    [place]    Nerve / Sites Muscle Latency Ref. Amplitude Ref. Rel Amp Segments Distance Velocity Ref. Area    ms ms mV mV %  cm m/s m/s mVms  R Median - APB     Wrist APB 4.1 ?4.4 4.0 ?4.0 100 Wrist - APB 7   11.1     Upper arm APB 8.9  3.6  89.3 Upper arm - Wrist 24 50 ?49 12.9  L Median - APB     Wrist APB 4.6 ?4.4 3.5 ?4.0 100 Wrist - APB 7   9.7     Upper arm APB 9.0  3.1  89 Upper arm - Wrist 24 54 ?49 8.2  R Ulnar - ADM     Wrist ADM 3.4 ?3.3 8.4 ?6.0 100 Wrist - ADM 7   26.9     B.Elbow ADM 8.3  7.5  89.5 B.Elbow - Wrist 22 44 ?49 25.6     A.Elbow ADM 10.9  7.4  99.2 A.Elbow - B.Elbow 12 46 ?49 27.8         A.Elbow - Wrist      L Ulnar - ADM     Wrist ADM 3.2 ?3.3 6.9 ?6.0 100 Wrist - ADM 7   26.8     B.Elbow ADM 7.7  6.9  100 B.Elbow - Wrist 22 49 ?49 26.3     A.Elbow ADM 10.3  6.2  89.4 A.Elbow - B.Elbow 12 45 ?49 26.1         A.Elbow - Wrist      L Peroneal - EDB     Ankle EDB 5.9 ?6.5 0.7 ?2.0 100 Ankle - EDB 9   2.6     Fib head EDB 14.5  0.6  91.6 Fib head - Ankle 34 40 ?44 2.3     Pop fossa EDB 16.9  0.4  68.8 Pop fossa - Fib head 10 41 ?44 1.8         Pop fossa - Ankle      L Tibial - AH     Ankle AH 6.4 ?5.8 0.4 ?4.0 100 Ankle - AH 9   1.3     Pop fossa AH 17.6  0.4  121 Pop fossa - Ankle 42 38 ?41 1.7                 SNC    Nerve / Sites Rec. Site Peak Lat Ref.  Amp Ref. Segments Distance    ms ms V V  cm  R Radial - Anatomical snuff box (Forearm)     Forearm Wrist 2.7 ?2.9 9 ?15 Forearm - Wrist 10  L Radial - Anatomical snuff box (Forearm)     Forearm Wrist 2.8 ?2.9 9 ?15 Forearm - Wrist 10  L Sural - Ankle (Calf)     Calf Ankle NR ?4.4 NR ?6 Calf - Ankle 14  L Superficial  peroneal - Ankle     Lat leg Ankle NR ?4.4 NR ?6 Lat leg - Ankle 14  R Median - Orthodromic (Dig II, Mid palm)     Dig II Wrist 3.6 ?3.4 5 ?10 Dig II - Wrist 13  L Median - Orthodromic (Dig II,  Mid palm)     Dig II Wrist 3.4 ?3.4 6 ?10 Dig II - Wrist 13  R Ulnar - Orthodromic, (Dig V, Mid palm)     Dig V Wrist NR ?3.1 NR ?5 Dig V - Wrist 11  L Ulnar - Orthodromic, (Dig V, Mid palm)     Dig V Wrist NR ?3.1 NR ?5 Dig V - Wrist 68                     F  Wave    Nerve F Lat Ref.   ms ms  R Ulnar - ADM 35.1 ?32.0  L Ulnar - ADM 34.9 ?32.0  L Tibial - AH 61.7 ?56.0           EMG full

## 2017-07-17 NOTE — Procedures (Signed)
     HISTORY:  Tom Norton is an 84 year old gentleman with reports of some numbness in the feet.  He has some mild gait instability, clinical examination revealed evidence of a peripheral neuropathy and the patient is being evaluated for this issue.  The patient is also had some discomfort down the left arm from the neck, he is being evaluated for possible left cervical radiculopathy.  NERVE CONDUCTION STUDIES:  Nerve conduction studies were performed on both upper extremities.  The distal motor latencies for the median nerves were prolonged bilaterally with a low motor amplitude on the left, borderline normal on the right.  The distal motor latency for the right ulnar nerve was prolonged, normal on the left.  The motor amplitudes for the ulnar nerves were normal bilaterally.  Slowing was seen above and below the elbow for the left ulnar nerve and above the elbow only for the left ulnar nerve.  Nerve conduction velocities for the median nerves were normal bilaterally.  The sensory latencies for the radial nerves were normal bilaterally, and the median sensory latency on the right was slightly prolonged, borderline normal for the left median nerve and unobtainable for the ulnar nerves bilaterally.  The F-wave latencies for the ulnar nerves were prolonged bilaterally.  Nerve conduction studies were performed on the left lower extremity.  The distal motor latency for the left peroneal nerve was normal but was prolonged for the left posterior tibial nerve.  The motor amplitudes for the peroneal and posterior tibial nerves were low.  Slowing was seen for these nerves as well.  The left peroneal and left sural sensory latencies were unobtainable.  The F-wave latency for the left posterior tibial nerve was prolonged.  EMG STUDIES:  EMG study was performed on the left upper extremity:  The first dorsal interosseous muscle reveals 2 to 4 K units with decreased recruitment. No fibrillations or positive  waves were noted. The abductor pollicis brevis muscle reveals 2 to 4 K units with decreased recruitment. No fibrillations or positive waves were noted. The extensor indicis proprius muscle reveals 1 to 3 K units with full recruitment. No fibrillations or positive waves were noted. The pronator teres muscle reveals 2 to 3 K units with full recruitment. No fibrillations or positive waves were noted. The biceps muscle reveals 1 to 2 K units with full recruitment. No fibrillations or positive waves were noted. The triceps muscle reveals 2 to 4 K units with full recruitment. No fibrillations or positive waves were noted. The anterior deltoid muscle reveals 2 to 3 K units with full recruitment. No fibrillations or positive waves were noted. The cervical paraspinal muscles were tested at 2 levels. No abnormalities of insertional activity were seen at either level tested. There was fair relaxation.   IMPRESSION:  Nerve conduction studies done on both upper extremities and on the left lower extremity shows evidence of a generalized primarily axonal peripheral neuropathy of moderate severity.  There is an overlying mild bilateral carpal tunnel syndrome.  EMG evaluation of the left upper extremity showed mild chronic stable distal signs of denervation consistent with the peripheral neuropathy.  There is no evidence of an overlying cervical radiculopathy.  Jill Alexanders MD 07/17/2017 4:53 PM  [facility] [address] [place], [place] [address]  Phone [phone] Fax [phone]

## 2017-07-19 DIAGNOSIS — M179 Osteoarthritis of knee, unspecified: Secondary | ICD-10-CM | POA: Diagnosis not present

## 2017-07-19 DIAGNOSIS — E7800 Pure hypercholesterolemia, unspecified: Secondary | ICD-10-CM | POA: Diagnosis not present

## 2017-07-19 DIAGNOSIS — I480 Paroxysmal atrial fibrillation: Secondary | ICD-10-CM | POA: Diagnosis not present

## 2017-07-19 DIAGNOSIS — I10 Essential (primary) hypertension: Secondary | ICD-10-CM | POA: Diagnosis not present

## 2017-07-19 DIAGNOSIS — I4891 Unspecified atrial fibrillation: Secondary | ICD-10-CM | POA: Diagnosis not present

## 2017-07-19 DIAGNOSIS — N400 Enlarged prostate without lower urinary tract symptoms: Secondary | ICD-10-CM | POA: Diagnosis not present

## 2017-07-19 DIAGNOSIS — M170 Bilateral primary osteoarthritis of knee: Secondary | ICD-10-CM | POA: Diagnosis not present

## 2017-07-19 DIAGNOSIS — E039 Hypothyroidism, unspecified: Secondary | ICD-10-CM | POA: Diagnosis not present

## 2017-07-19 LAB — MULTIPLE MYELOMA PANEL, SERUM
ALPHA 1: 0.2 g/dL (ref 0.0–0.4)
Albumin SerPl Elph-Mcnc: 3.7 g/dL (ref 2.9–4.4)
Albumin/Glob SerPl: 1.4 (ref 0.7–1.7)
Alpha2 Glob SerPl Elph-Mcnc: 0.8 g/dL (ref 0.4–1.0)
B-Globulin SerPl Elph-Mcnc: 0.9 g/dL (ref 0.7–1.3)
Gamma Glob SerPl Elph-Mcnc: 0.9 g/dL (ref 0.4–1.8)
Globulin, Total: 2.8 g/dL (ref 2.2–3.9)
IGA/IMMUNOGLOBULIN A, SERUM: 122 mg/dL (ref 61–437)
IGG (IMMUNOGLOBIN G), SERUM: 899 mg/dL (ref 700–1600)
IGM (IMMUNOGLOBULIN M), SRM: 80 mg/dL (ref 15–143)
TOTAL PROTEIN: 6.5 g/dL (ref 6.0–8.5)

## 2017-07-19 LAB — B. BURGDORFI ANTIBODIES: Lyme IgG/IgM Ab: <0.91 {ISR} (ref 0.00–0.90)

## 2017-07-19 LAB — ANGIOTENSIN CONVERTING ENZYME: ANGIO CONVERT ENZYME: 34 U/L (ref 14–82)

## 2017-07-19 LAB — ANA W/REFLEX: ANA: NEGATIVE

## 2017-07-19 LAB — SEDIMENTATION RATE: SED RATE: 2 mm/h (ref 0–30)

## 2017-07-19 LAB — VITAMIN B12: Vitamin B-12: 384 pg/mL (ref 232–1245)

## 2017-07-20 ENCOUNTER — Encounter (HOSPITAL_COMMUNITY): Payer: Self-pay | Admitting: *Deleted

## 2017-07-20 ENCOUNTER — Encounter (HOSPITAL_COMMUNITY)
Admission: RE | Disposition: A | Discharge status: Home Health | Payer: Self-pay | Source: Ambulatory Visit | Attending: Specialist

## 2017-07-20 ENCOUNTER — Inpatient Hospital Stay (HOSPITAL_COMMUNITY): Payer: Medicare Other | Admitting: Anesthesiology

## 2017-07-20 ENCOUNTER — Inpatient Hospital Stay (HOSPITAL_COMMUNITY)
Admission: RE | Admit: 2017-07-20 | Discharge: 2017-07-22 | DRG: 470 | Disposition: A | Discharge status: Home Health | Payer: Medicare Other | Source: Ambulatory Visit | Attending: Specialist | Admitting: Specialist

## 2017-07-20 ENCOUNTER — Other Ambulatory Visit: Payer: Self-pay

## 2017-07-20 DIAGNOSIS — E039 Hypothyroidism, unspecified: Secondary | ICD-10-CM | POA: Diagnosis present

## 2017-07-20 DIAGNOSIS — K219 Gastro-esophageal reflux disease without esophagitis: Secondary | ICD-10-CM | POA: Diagnosis present

## 2017-07-20 DIAGNOSIS — E785 Hyperlipidemia, unspecified: Secondary | ICD-10-CM | POA: Diagnosis present

## 2017-07-20 DIAGNOSIS — E669 Obesity, unspecified: Secondary | ICD-10-CM | POA: Diagnosis present

## 2017-07-20 DIAGNOSIS — Z6829 Body mass index (BMI) 29.0-29.9, adult: Secondary | ICD-10-CM

## 2017-07-20 DIAGNOSIS — I10 Essential (primary) hypertension: Secondary | ICD-10-CM | POA: Diagnosis present

## 2017-07-20 DIAGNOSIS — G629 Polyneuropathy, unspecified: Secondary | ICD-10-CM | POA: Diagnosis present

## 2017-07-20 DIAGNOSIS — G8918 Other acute postprocedural pain: Secondary | ICD-10-CM | POA: Diagnosis not present

## 2017-07-20 DIAGNOSIS — E7800 Pure hypercholesterolemia, unspecified: Secondary | ICD-10-CM | POA: Diagnosis present

## 2017-07-20 DIAGNOSIS — I480 Paroxysmal atrial fibrillation: Secondary | ICD-10-CM | POA: Diagnosis present

## 2017-07-20 DIAGNOSIS — Z7901 Long term (current) use of anticoagulants: Secondary | ICD-10-CM | POA: Diagnosis not present

## 2017-07-20 DIAGNOSIS — Z96659 Presence of unspecified artificial knee joint: Secondary | ICD-10-CM

## 2017-07-20 DIAGNOSIS — Z79890 Hormone replacement therapy (postmenopausal): Secondary | ICD-10-CM

## 2017-07-20 DIAGNOSIS — M1712 Unilateral primary osteoarthritis, left knee: Secondary | ICD-10-CM | POA: Diagnosis present

## 2017-07-20 DIAGNOSIS — I739 Peripheral vascular disease, unspecified: Secondary | ICD-10-CM | POA: Diagnosis present

## 2017-07-20 DIAGNOSIS — M25562 Pain in left knee: Secondary | ICD-10-CM | POA: Diagnosis not present

## 2017-07-20 HISTORY — PX: TOTAL KNEE ARTHROPLASTY: SHX125

## 2017-07-20 LAB — TYPE AND SCREEN
ABO/RH(D): O NEG
ANTIBODY SCREEN: NEGATIVE

## 2017-07-20 LAB — PROTIME-INR
INR: 1.09
PROTHROMBIN TIME: 14.1 s (ref 11.4–15.2)

## 2017-07-20 LAB — APTT: aPTT: 32 seconds (ref 24–36)

## 2017-07-20 SURGERY — ARTHROPLASTY, KNEE, TOTAL
Anesthesia: Spinal | Site: Knee | Laterality: Left

## 2017-07-20 MED ORDER — BUPIVACAINE IN DEXTROSE 0.75-8.25 % IT SOLN
INTRATHECAL | Status: DC | PRN
  Administered 2017-07-20: 15 mg via INTRATHECAL

## 2017-07-20 MED ORDER — FENTANYL CITRATE (PF) 100 MCG/2ML IJ SOLN
50.0000 ug | INTRAMUSCULAR | Status: DC
  Administered 2017-07-20: 100 ug via INTRAVENOUS

## 2017-07-20 MED ORDER — METHOCARBAMOL 1000 MG/10ML IJ SOLN
500.0000 mg | Freq: Four times a day (QID) | INTRAVENOUS | Status: DC | PRN
  Filled 2017-07-20: qty 5

## 2017-07-20 MED ORDER — MAGNESIUM CITRATE PO SOLN: 1.0000 | Freq: Once | ORAL | Status: DC | PRN

## 2017-07-20 MED ORDER — SODIUM CHLORIDE 0.9 % IV SOLN
INTRAVENOUS | Status: DC
  Administered 2017-07-20 – 2017-07-22 (×3): via INTRAVENOUS

## 2017-07-20 MED ORDER — ALUM & MAG HYDROXIDE-SIMETH 200-200-20 MG/5ML PO SUSP: 30.0000 mL | ORAL | Status: DC | PRN

## 2017-07-20 MED ORDER — OXYCODONE HCL 5 MG PO TABS
10.0000 mg | ORAL_TABLET | ORAL | Status: DC | PRN
  Administered 2017-07-20: 21:00:00 10 mg via ORAL
  Administered 2017-07-22: 09:00:00 5 mg via ORAL

## 2017-07-20 MED ORDER — CEFAZOLIN SODIUM-DEXTROSE 2-4 GM/100ML-% IV SOLN
2.0000 g | Freq: Four times a day (QID) | INTRAVENOUS | Status: AC
  Administered 2017-07-20 – 2017-07-21 (×2): 2 g via INTRAVENOUS
  Filled 2017-07-20 (×2): qty 100

## 2017-07-20 MED ORDER — ONDANSETRON HCL 4 MG/2ML IJ SOLN
INTRAMUSCULAR | Status: AC
  Filled 2017-07-20: qty 2

## 2017-07-20 MED ORDER — ONDANSETRON HCL 4 MG/2ML IJ SOLN: 4.0000 mg | Freq: Four times a day (QID) | INTRAMUSCULAR | Status: DC | PRN

## 2017-07-20 MED ORDER — KETOROLAC TROMETHAMINE 30 MG/ML IJ SOLN
INTRAMUSCULAR | Status: DC | PRN
  Administered 2017-07-20: 30 mg

## 2017-07-20 MED ORDER — PHENOL 1.4 % MT LIQD
1.0000 | OROMUCOSAL | Status: DC | PRN
  Filled 2017-07-20: qty 177

## 2017-07-20 MED ORDER — 0.9 % SODIUM CHLORIDE (POUR BTL) OPTIME
TOPICAL | Status: DC | PRN
  Administered 2017-07-20: 1000 mL

## 2017-07-20 MED ORDER — PROPOFOL 500 MG/50ML IV EMUL
INTRAVENOUS | Status: DC | PRN
  Administered 2017-07-20: 50 ug/kg/min via INTRAVENOUS

## 2017-07-20 MED ORDER — BISACODYL 5 MG PO TBEC: 5.0000 mg | DELAYED_RELEASE_TABLET | Freq: Every day | ORAL | Status: DC | PRN

## 2017-07-20 MED ORDER — DEXAMETHASONE SODIUM PHOSPHATE 10 MG/ML IJ SOLN
10.0000 mg | Freq: Once | INTRAMUSCULAR | Status: AC
  Administered 2017-07-20: 10 mg via INTRAVENOUS

## 2017-07-20 MED ORDER — METHOCARBAMOL 500 MG PO TABS: 500.0000 mg | ORAL_TABLET | Freq: Four times a day (QID) | ORAL | Status: DC | PRN

## 2017-07-20 MED ORDER — DIPHENHYDRAMINE HCL 12.5 MG/5ML PO ELIX: 12.5000 mg | ORAL_SOLUTION | ORAL | Status: DC | PRN

## 2017-07-20 MED ORDER — ACETAMINOPHEN 325 MG PO TABS: 325.0000 mg | ORAL_TABLET | Freq: Four times a day (QID) | ORAL | Status: DC | PRN

## 2017-07-20 MED ORDER — OXYCODONE HCL 5 MG PO TABS
5.0000 mg | ORAL_TABLET | ORAL | Status: DC | PRN
  Administered 2017-07-21: 09:00:00 5 mg via ORAL
  Filled 2017-07-20: qty 2
  Filled 2017-07-20 (×2): qty 1

## 2017-07-20 MED ORDER — FENTANYL CITRATE (PF) 100 MCG/2ML IJ SOLN
INTRAMUSCULAR | Status: AC
  Filled 2017-07-20: qty 2

## 2017-07-20 MED ORDER — APIXABAN 5 MG PO TABS
5.0000 mg | ORAL_TABLET | Freq: Two times a day (BID) | ORAL | Status: DC
  Administered 2017-07-21 – 2017-07-22 (×3): 5 mg via ORAL
  Filled 2017-07-20 (×3): qty 1

## 2017-07-20 MED ORDER — POLYETHYLENE GLYCOL 3350 17 G PO PACK: 17.0000 g | PACK | Freq: Every day | ORAL | Status: DC | PRN

## 2017-07-20 MED ORDER — ROPIVACAINE HCL 7.5 MG/ML IJ SOLN
INTRAMUSCULAR | Status: DC | PRN
  Administered 2017-07-20: 20 mL via PERINEURAL

## 2017-07-20 MED ORDER — ACETAMINOPHEN 500 MG PO TABS
1000.0000 mg | ORAL_TABLET | Freq: Four times a day (QID) | ORAL | Status: AC
  Administered 2017-07-21 (×4): 1000 mg via ORAL
  Filled 2017-07-20 (×4): qty 2

## 2017-07-20 MED ORDER — OXYCODONE HCL 5 MG PO TABS: 5.0000 mg | ORAL_TABLET | ORAL | 0 refills | Status: AC | PRN

## 2017-07-20 MED ORDER — CEFAZOLIN SODIUM-DEXTROSE 2-4 GM/100ML-% IV SOLN
2.0000 g | INTRAVENOUS | Status: AC
  Administered 2017-07-20 – 2017-07-21 (×2): 2 g via INTRAVENOUS
  Filled 2017-07-20: qty 100

## 2017-07-20 MED ORDER — SODIUM CHLORIDE 0.9 % IJ SOLN
INTRAMUSCULAR | Status: AC
  Filled 2017-07-20: qty 50

## 2017-07-20 MED ORDER — BACLOFEN 10 MG PO TABS: 10.0000 mg | ORAL_TABLET | Freq: Three times a day (TID) | ORAL | 1 refills | Status: DC | PRN

## 2017-07-20 MED ORDER — BUPIVACAINE HCL (PF) 0.25 % IJ SOLN
INTRAMUSCULAR | Status: AC
  Filled 2017-07-20: qty 30

## 2017-07-20 MED ORDER — LACTATED RINGERS IV SOLN
INTRAVENOUS | Status: DC
  Administered 2017-07-20 (×3): via INTRAVENOUS

## 2017-07-20 MED ORDER — METOCLOPRAMIDE HCL 5 MG PO TABS: 5.0000 mg | ORAL_TABLET | Freq: Three times a day (TID) | ORAL | Status: DC | PRN

## 2017-07-20 MED ORDER — ONDANSETRON HCL 4 MG PO TABS: 4.0000 mg | ORAL_TABLET | Freq: Four times a day (QID) | ORAL | Status: DC | PRN

## 2017-07-20 MED ORDER — METOCLOPRAMIDE HCL 5 MG/ML IJ SOLN: 5.0000 mg | Freq: Three times a day (TID) | INTRAMUSCULAR | Status: DC | PRN

## 2017-07-20 MED ORDER — BUPIVACAINE HCL (PF) 0.25 % IJ SOLN
INTRAMUSCULAR | Status: DC | PRN
  Administered 2017-07-20: 30 mL

## 2017-07-20 MED ORDER — FERROUS SULFATE 325 (65 FE) MG PO TABS
325.0000 mg | ORAL_TABLET | Freq: Three times a day (TID) | ORAL | Status: DC
  Administered 2017-07-21 – 2017-07-22 (×4): 325 mg via ORAL
  Filled 2017-07-20 (×4): qty 1

## 2017-07-20 MED ORDER — MENTHOL 3 MG MT LOZG: 1.0000 | LOZENGE | OROMUCOSAL | Status: DC | PRN

## 2017-07-20 MED ORDER — ONDANSETRON HCL 4 MG/2ML IJ SOLN
INTRAMUSCULAR | Status: DC | PRN
  Administered 2017-07-20 (×2): 4 mg via INTRAVENOUS

## 2017-07-20 MED ORDER — CHLORHEXIDINE GLUCONATE 4 % EX LIQD: 60.0000 mL | Freq: Once | CUTANEOUS | Status: DC

## 2017-07-20 MED ORDER — HYDROMORPHONE HCL 1 MG/ML IJ SOLN: 0.5000 mg | INTRAMUSCULAR | Status: DC | PRN

## 2017-07-20 MED ORDER — SODIUM CHLORIDE 0.9 % IJ SOLN
INTRAMUSCULAR | Status: DC | PRN
  Administered 2017-07-20: 30 mL

## 2017-07-20 MED ORDER — DEXAMETHASONE SODIUM PHOSPHATE 10 MG/ML IJ SOLN
10.0000 mg | Freq: Once | INTRAMUSCULAR | Status: AC
  Administered 2017-07-21: 10 mg via INTRAVENOUS
  Filled 2017-07-20: qty 1

## 2017-07-20 MED ORDER — SODIUM CHLORIDE 0.9 % IR SOLN
Status: DC | PRN
  Administered 2017-07-20: 1000 mL

## 2017-07-20 MED ORDER — PROPOFOL 10 MG/ML IV BOLUS
INTRAVENOUS | Status: AC
  Filled 2017-07-20: qty 60

## 2017-07-20 MED ORDER — LEVOTHYROXINE SODIUM 25 MCG PO TABS
25.0000 ug | ORAL_TABLET | Freq: Every day | ORAL | Status: DC
  Administered 2017-07-21 – 2017-07-22 (×2): 25 ug via ORAL
  Filled 2017-07-20 (×2): qty 1

## 2017-07-20 MED ORDER — KETOROLAC TROMETHAMINE 30 MG/ML IJ SOLN
INTRAMUSCULAR | Status: AC
  Filled 2017-07-20: qty 1

## 2017-07-20 MED ORDER — STERILE WATER FOR IRRIGATION IR SOLN
Status: DC | PRN
  Administered 2017-07-20: 2000 mL

## 2017-07-20 MED ORDER — DOCUSATE SODIUM 100 MG PO CAPS
100.0000 mg | ORAL_CAPSULE | Freq: Two times a day (BID) | ORAL | Status: DC
  Administered 2017-07-20 – 2017-07-22 (×4): 100 mg via ORAL
  Filled 2017-07-20 (×4): qty 1

## 2017-07-20 MED ORDER — DEXAMETHASONE SODIUM PHOSPHATE 10 MG/ML IJ SOLN
INTRAMUSCULAR | Status: AC
  Filled 2017-07-20: qty 1

## 2017-07-20 MED ORDER — PROPOFOL 10 MG/ML IV BOLUS
INTRAVENOUS | Status: AC
  Filled 2017-07-20: qty 20

## 2017-07-20 MED ORDER — TAMSULOSIN HCL 0.4 MG PO CAPS
0.4000 mg | ORAL_CAPSULE | Freq: Every day | ORAL | Status: DC
  Administered 2017-07-20 – 2017-07-21 (×2): 0.4 mg via ORAL
  Filled 2017-07-20 (×2): qty 1

## 2017-07-20 SURGICAL SUPPLY — 62 items
BAG DECANTER FOR FLEXI CONT (MISCELLANEOUS) IMPLANT
BAG ZIPLOCK 12X15 (MISCELLANEOUS) ×6 IMPLANT
BANDAGE ACE 4X5 VEL STRL LF (GAUZE/BANDAGES/DRESSINGS) ×3 IMPLANT
BANDAGE ACE 6X5 VEL STRL LF (GAUZE/BANDAGES/DRESSINGS) ×3 IMPLANT
BLADE 10 SAFETY STRL DISP (BLADE) ×3 IMPLANT
BLADE SAG 18X100X1.27 (BLADE) ×3 IMPLANT
BLADE SAW SGTL 13.0X1.19X90.0M (BLADE) ×3 IMPLANT
BOWL SMART MIX CTS (DISPOSABLE) ×3 IMPLANT
CAP KNEE TOTAL 3 SIGMA ×3 IMPLANT
CEMENT HV SMART SET (Cement) ×6 IMPLANT
COVER SURGICAL LIGHT HANDLE (MISCELLANEOUS) ×3 IMPLANT
CUFF TOURN SGL QUICK 34 (TOURNIQUET CUFF) ×2
CUFF TRNQT CYL 34X4X40X1 (TOURNIQUET CUFF) ×1 IMPLANT
DECANTER SPIKE VIAL GLASS SM (MISCELLANEOUS) ×3 IMPLANT
DERMABOND ADVANCED (GAUZE/BANDAGES/DRESSINGS) ×2
DERMABOND ADVANCED .7 DNX12 (GAUZE/BANDAGES/DRESSINGS) ×1 IMPLANT
DRAPE TOP 10253 STERILE (DRAPES) IMPLANT
DRAPE U-SHAPE 47X51 STRL (DRAPES) ×3 IMPLANT
DRSG AQUACEL AG ADV 3.5X10 (GAUZE/BANDAGES/DRESSINGS) ×3 IMPLANT
DRSG TEGADERM 4X4.75 (GAUZE/BANDAGES/DRESSINGS) ×3 IMPLANT
DURAPREP 26ML APPLICATOR (WOUND CARE) ×9 IMPLANT
ELECT REM PT RETURN 15FT ADLT (MISCELLANEOUS) ×3 IMPLANT
EVACUATOR 1/8 PVC DRAIN (DRAIN) ×3 IMPLANT
GAUZE SPONGE 2X2 8PLY STRL LF (GAUZE/BANDAGES/DRESSINGS) ×1 IMPLANT
GLOVE BIO SURGEON STRL SZ7.5 (GLOVE) ×6 IMPLANT
GLOVE BIOGEL PI IND STRL 7.5 (GLOVE) ×3 IMPLANT
GLOVE BIOGEL PI IND STRL 8 (GLOVE) ×2 IMPLANT
GLOVE BIOGEL PI INDICATOR 7.5 (GLOVE) ×6
GLOVE BIOGEL PI INDICATOR 8 (GLOVE) ×4
GLOVE ECLIPSE 8.0 STRL XLNG CF (GLOVE) ×6 IMPLANT
GLOVE ECLIPSE 8.5 STRL (GLOVE) ×3 IMPLANT
GLOVE SURG ORTHO 9.0 STRL STRW (GLOVE) ×3 IMPLANT
GLOVE SURG SS PI 7.0 STRL IVOR (GLOVE) ×3 IMPLANT
GLOVE SURG SS PI 7.5 STRL IVOR (GLOVE) ×12 IMPLANT
GOWN STRL REUS W/TWL 2XL LVL3 (GOWN DISPOSABLE) ×3 IMPLANT
GOWN STRL REUS W/TWL XL LVL3 (GOWN DISPOSABLE) ×9 IMPLANT
HANDPIECE INTERPULSE COAX TIP (DISPOSABLE) ×2
IMMOBILIZER KNEE 20 (SOFTGOODS) ×3
IMMOBILIZER KNEE 20 THIGH 36 (SOFTGOODS) ×1 IMPLANT
PACK TOTAL KNEE CUSTOM (KITS) ×3 IMPLANT
POSITIONER SURGICAL ARM (MISCELLANEOUS) ×3 IMPLANT
SET HNDPC FAN SPRY TIP SCT (DISPOSABLE) ×1 IMPLANT
SET PAD KNEE POSITIONER (MISCELLANEOUS) ×3 IMPLANT
SPONGE GAUZE 2X2 STER 10/PKG (GAUZE/BANDAGES/DRESSINGS) ×2
SPONGE LAP 18X18 X RAY DECT (DISPOSABLE) IMPLANT
SPONGE SURGIFOAM ABS GEL 100 (HEMOSTASIS) ×3 IMPLANT
STOCKINETTE 6  STRL (DRAPES) ×2
STOCKINETTE 6 STRL (DRAPES) ×1 IMPLANT
SUCTION FRAZIER HANDLE 12FR (TUBING) ×2
SUCTION TUBE FRAZIER 12FR DISP (TUBING) ×1 IMPLANT
SUT BONE WAX W31G (SUTURE) ×3 IMPLANT
SUT MNCRL AB 3-0 PS2 18 (SUTURE) ×3 IMPLANT
SUT VIC AB 1 CT1 27 (SUTURE) ×8
SUT VIC AB 1 CT1 27XBRD ANTBC (SUTURE) ×4 IMPLANT
SUT VIC AB 2-0 CT1 27 (SUTURE) ×4
SUT VIC AB 2-0 CT1 TAPERPNT 27 (SUTURE) ×2 IMPLANT
SUT VLOC 180 0 24IN GS25 (SUTURE) ×3 IMPLANT
SYR 50ML LL SCALE MARK (SYRINGE) ×3 IMPLANT
TAPE STRIPS DRAPE STRL (GAUZE/BANDAGES/DRESSINGS) ×3 IMPLANT
TRAY FOLEY W/METER SILVER 16FR (SET/KITS/TRAYS/PACK) ×3 IMPLANT
WRAP KNEE MAXI GEL POST OP (GAUZE/BANDAGES/DRESSINGS) ×3 IMPLANT
YANKAUER SUCT BULB TIP 10FT TU (MISCELLANEOUS) ×3 IMPLANT

## 2017-07-20 NOTE — Progress Notes (Signed)
Assisted Dr. Carswell Jackson with left, ultrasound guided, adductor canal block. Side rails up, monitors on throughout procedure. See vital signs in flow sheet. Tolerated Procedure well.  

## 2017-07-20 NOTE — Anesthesia Procedure Notes (Signed)
Spinal  Patient location during procedure: OR End time: 07/20/2017 4:04 PM Staffing Anesthesiologist: Annye Asa, MD Performed: anesthesiologist  Preanesthetic Checklist Completed: patient identified, site marked, surgical consent, pre-op evaluation, timeout performed, IV checked, risks and benefits discussed and monitors and equipment checked Spinal Block Patient position: sitting Prep: site prepped and draped and DuraPrep Patient monitoring: blood pressure, continuous pulse ox, cardiac monitor and heart rate Approach: midline Location: L3-4 Injection technique: single-shot Needle Needle type: Pencan  Needle gauge: 24 G Needle length: 9 cm Additional Notes Pt identified in Operating room.  Monitors applied. Working IV access confirmed. Sterile prep, drape lumbar spine.  1% lido local L 3,4. CRNA all attempts os,  #24ga into clear CSF L 3,4.  15mg  0.75% Bupivacaine with dextrose injected with asp CSF beginning and end of injection.  Patient asymptomatic, VSS, no heme aspirated, tolerated well.  Jenita Seashore, MD

## 2017-07-20 NOTE — Op Note (Signed)
DATE OF SURGERY:  07/20/2017  TIME: 6:06 PM  PATIENT NAME:  Tom Norton    AGE: 84 y.o.   PRE-OPERATIVE DIAGNOSIS:  left knee osteoarthritis  POST-OPERATIVE DIAGNOSIS:  left knee osteoarthritis  PROCEDURE:  Procedure(s): LEFT TOTAL KNEE ARTHROPLASTY  SURGEON:  Ugo Thoma ANDREW  ASSISTANT:  Bryson Stilwell, PA-C, present and scrubbed throughout the case, critical for assistance with exposure, retraction, instrumentation, and closure.  OPERATIVE IMPLANTS: Depuy PFC Sigma Rotating Platform.  Femur size 5, Tibia size 5, Patella size 38 3-peg oval button, with a 10 mm polyethylene insert.   PREOPERATIVE INDICATIONS:   Tom Norton is a 84 y.o. year old male with end stage bone on bone arthritis of the knee who failed conservative treatment and elected for Total Knee Arthroplasty.   The risks, benefits, and alternatives were discussed at length including but not limited to the risks of infection, bleeding, nerve injury, stiffness, blood clots, the need for revision surgery, cardiopulmonary complications, among others, and they were willing to proceed.  OPERATIVE DESCRIPTION:  The patient was brought to the operative room and placed in a supine position.  Spinal anesthesia was administered.  IV antibiotics were given.  The lower extremity was prepped and draped in the usual sterile fashion.  Time out was performed.  The leg was elevated and exsanguinated and the tourniquet was inflated.  Anterior quadriceps tendon splitting approach was performed.  The patella was retracted and osteophytes were removed.  The anterior horn of the medial and lateral meniscus was removed and cruciate ligaments resected.   The distal femur was opened with the drill and the intramedullary distal femoral cutting jig was utilized, set at 5 degrees resecting 10 mm off the distal femur.  Care was taken to protect the collateral ligaments.  The distal femoral sizing jig was applied, taking care to  avoid notching.  Then the 4-in-1 cutting jig was applied and the anterior and posterior femur was cut, along with the chamfer cuts.    Then the extramedullary tibial cutting jig was utilized making the appropriate cut using the anterior tibial crest as a reference building in appropriate posterior slope.  Care was taken during the cut to protect the medial and collateral ligaments.  The proximal tibia was removed along with the posterior horns of the menisci.   The posterior medial femoral osteophytes and posterior lateral femoral osteophytes were removed.    The flexion gap was then measured and was symmetric with the extension gap, measured at 10.  I completed the distal femoral preparation using the appropriate jig to prepare the box.  The patella was then measured, and cut with the saw.    The proximal tibia sized and prepared accordingly with the reamer and the punch, and then all components were trialed with the trial insert.  The knee was found to have excellent balance and full motion.    The above named components were then cemented into place and all excess cement was removed.  The trial polyethylene component was in place during cementation, and then was exchanged for the real polyethylene component.    The knee was easily taken through a range of motion and the patella tracked well and the knee irrigated copiously and the parapatellar and subcutaneous tissue closed with vicryl, and monocryl with steri strips for the skin.  The arthrotomy was closed at 90 of flexion. The wounds were dressed with sterile gauze and the tourniquet released and the patient was awakened and returned to the PACU  in stable and satisfactory condition.  There were no complications.  Total tourniquet time was 80 minutes.

## 2017-07-20 NOTE — Transfer of Care (Signed)
Immediate Anesthesia Transfer of Care Note  Patient: Tom Norton  Procedure(s) Performed: LEFT TOTAL KNEE ARTHROPLASTY (Left Knee)  Patient Location: PACU  Anesthesia Type:Spinal  Level of Consciousness: awake, alert , oriented and patient cooperative  Airway & Oxygen Therapy: Patient Spontanous Breathing and Patient connected to face mask oxygen  Post-op Assessment: Report given to RN and Post -op Vital signs reviewed and stable  Post vital signs: stable  Last Vitals:  Vitals Value Taken Time  BP 138/84 07/20/2017  6:30 PM  Temp 36.3 C 07/20/2017  6:30 PM  Pulse 65 07/20/2017  6:35 PM  Resp 18 07/20/2017  6:35 PM  SpO2 94 % 07/20/2017  6:35 PM  Vitals shown include unvalidated device data.  Last Pain:  Vitals:   07/20/17 1830  TempSrc:   PainSc: 0-No pain         Complications: No apparent anesthesia complications

## 2017-07-20 NOTE — Anesthesia Procedure Notes (Signed)
Date/Time: 07/20/2017 4:05 PM Performed by: Talbot Grumbling, CRNA Oxygen Delivery Method: Simple face mask

## 2017-07-20 NOTE — Anesthesia Procedure Notes (Signed)
Anesthesia Regional Block: Adductor canal block   Pre-Anesthetic Checklist: ,, timeout performed, Correct Patient, Correct Site, Correct Laterality, Correct Procedure, Correct Position, site marked, Risks and benefits discussed,  Surgical consent,  Pre-op evaluation,  At surgeon's request and post-op pain management  Laterality: Left and Lower  Prep: chloraprep       Needles:   Needle Type: Echogenic Needle     Needle Length: 9cm  Needle Gauge: 21     Additional Needles:   Procedures:,,,, ultrasound used (permanent image in chart),,,,  Narrative:  Start time: 07/20/2017 3:10 PM End time: 07/20/2017 3:17 PM Injection made incrementally with aspirations every 5 mL.  Performed by: Personally  Anesthesiologist: Annye Asa, MD  Additional Notes: Pt identified in Holding room.  Monitors applied. Working IV access confirmed. Sterile prep, drape L thigh.  #21ga ECHOgenic needle into adductor canal with US guidance.  20cc 0.75% Ropivacaine injected incrementally after negative test dose.  Patient asymptomatic, VSS, no heme aspirated, tolerated well.  Jenita Seashore, MD

## 2017-07-20 NOTE — Interval H&P Note (Signed)
History and Physical Interval Note:  07/20/2017 3:39 PM  Tom Norton  has presented today for surgery, with the diagnosis of left knee osteoarthritis  The various methods of treatment have been discussed with the patient and family. After consideration of risks, benefits and other options for treatment, the patient has consented to  Procedure(s): LEFT TOTAL KNEE ARTHROPLASTY (Left) as a surgical intervention .  The patient's history has been reviewed, patient examined, no change in status, stable for surgery.  I have reviewed the patient's chart and labs.  Questions were answered to the patient's satisfaction.     [name] ANDREW

## 2017-07-20 NOTE — Interval H&P Note (Signed)
History and Physical Interval Note:  07/20/2017 1:29 PM  Tom Norton  has presented today for surgery, with the diagnosis of left knee osteoarthritis  The various methods of treatment have been discussed with the patient and family. After consideration of risks, benefits and other options for treatment, the patient has consented to  Procedure(s): LEFT TOTAL KNEE ARTHROPLASTY (Left) as a surgical intervention .  The patient's history has been reviewed, patient examined, no change in status, stable for surgery.  I have reviewed the patient's chart and labs.  Questions were answered to the patient's satisfaction.     Iolanda Folson ANDREW

## 2017-07-20 NOTE — Anesthesia Postprocedure Evaluation (Signed)
Anesthesia Post Note  Patient: QUAY SIMKIN  Procedure(s) Performed: LEFT TOTAL KNEE ARTHROPLASTY (Left Knee)     Patient location during evaluation: PACU Anesthesia Type: Spinal and Regional Level of consciousness: awake and alert, oriented and patient cooperative Pain management: pain level controlled Vital Signs Assessment: post-procedure vital signs reviewed and stable Respiratory status: spontaneous breathing, respiratory function stable, nonlabored ventilation and patient connected to nasal cannula oxygen Cardiovascular status: blood pressure returned to baseline and stable Postop Assessment: spinal receding, patient able to bend at knees and no apparent nausea or vomiting Anesthetic complications: no    Last Vitals:  Vitals:   07/20/17 1830 07/20/17 1845  BP: 138/84   Pulse: 67 (!) 58  Resp: 18 14  Temp: (!) 36.3 C   SpO2: 97% 93%    Last Pain:  Vitals:   07/20/17 1900  TempSrc:   PainSc: 0-No pain                 Chevon Fomby,E. Sovereign Ramiro

## 2017-07-20 NOTE — Anesthesia Preprocedure Evaluation (Addendum)
Anesthesia Evaluation  Patient identified by MRN, date of birth, ID band Patient awake    Reviewed: Allergy & Precautions, NPO status , Patient's Chart, lab work & pertinent test results  History of Anesthesia Complications Negative for: history of anesthetic complications  Airway Mallampati: II  TM Distance: >3 FB Neck ROM: Full    Dental  (+) Dental Advisory Given   Pulmonary neg pulmonary ROS,    breath sounds clear to auscultation       Cardiovascular (-) hypertension(-) angina+ Peripheral Vascular Disease (4.2 cm aortic root dilation)  + dysrhythmias Atrial Fibrillation  Rhythm:Regular Rate:Normal  '15 ECHO: EF 55-60%, mild AI with aortic root dilation   Neuro/Psych Peripheral neuropathy    GI/Hepatic Neg liver ROS, GERD  Controlled,  Endo/Other  Hypothyroidism   Renal/GU      Musculoskeletal  (+) Arthritis ,   Abdominal   Peds  Hematology  (+) Blood dyscrasia (eliquis: last dose 07/16/17), ,   Anesthesia Other Findings   Reproductive/Obstetrics                            Anesthesia Physical Anesthesia Plan  ASA: III  Anesthesia Plan: Spinal   Post-op Pain Management:  Regional for Post-op pain   Induction:   PONV Risk Score and Plan: 1 and Ondansetron  Airway Management Planned: Natural Airway and Simple Face Mask  Additional Equipment:   Intra-op Plan:   Post-operative Plan:   Informed Consent: I have reviewed the patients History and Physical, chart, labs and discussed the procedure including the risks, benefits and alternatives for the proposed anesthesia with the patient or authorized representative who has indicated his/her understanding and acceptance.   Dental advisory given  Plan Discussed with: CRNA and Surgeon  Anesthesia Plan Comments: (Plan routine monitors, SAB with adductor canal block for post op analgesia)        Anesthesia Quick Evaluation

## 2017-07-21 ENCOUNTER — Encounter (HOSPITAL_COMMUNITY): Payer: Self-pay | Admitting: Orthopedic Surgery

## 2017-07-21 LAB — BASIC METABOLIC PANEL
ANION GAP: 6 (ref 5–15)
BUN: 20 mg/dL (ref 6–20)
CHLORIDE: 110 mmol/L (ref 101–111)
CO2: 26 mmol/L (ref 22–32)
Calcium: 8.7 mg/dL — ABNORMAL LOW (ref 8.9–10.3)
Creatinine, Ser: 1.28 mg/dL — ABNORMAL HIGH (ref 0.61–1.24)
GFR calc Af Amer: 57 mL/min — ABNORMAL LOW (ref >60)
GFR, EST NON AFRICAN AMERICAN: 49 mL/min — AB (ref >60)
GLUCOSE: 168 mg/dL — AB (ref 65–99)
POTASSIUM: 4 mmol/L (ref 3.5–5.1)
Sodium: 142 mmol/L (ref 135–145)

## 2017-07-21 LAB — CBC
HEMATOCRIT: 35.5 % — AB (ref 39.0–52.0)
Hemoglobin: 11.9 g/dL — ABNORMAL LOW (ref 13.0–17.0)
MCH: 31.7 pg (ref 26.0–34.0)
MCHC: 33.5 g/dL (ref 30.0–36.0)
MCV: 94.7 fL (ref 78.0–100.0)
PLATELETS: 172 10*3/uL (ref 150–400)
RBC: 3.75 MIL/uL — AB (ref 4.22–5.81)
RDW: 12.9 % (ref 11.5–15.5)
WBC: 8.9 10*3/uL (ref 4.0–10.5)

## 2017-07-21 NOTE — Progress Notes (Signed)
Physical Therapy Treatment Patient Details Name: Tom Norton MRN: [id number] DOB: 08-21-1929 Today's Date: 07/21/2017    History of Present Illness 84 y/o male s/p L TKA (07/20/17)    PT Comments    Pt is doing well.  He has stairs to enter the home.  On the front he has a long, uneven sidewalk that is stone and wife is worried about it, but there is a rail at front entry.  On the back, he would have a shorter walk, but more steps and some without rails.  PT discussed with pt, wife, and daughter and that PT could teach pt how to manage stairs whichever way, but thought the back entry sounded safer without the stone sidewalk. Pt and family are going to discuss, but will need stair training prior to d/c home.  Pt's RW has already been delivered to room.   Follow Up Recommendations  Follow surgeon's recommendation for DC plan and follow-up therapies     Equipment Recommendations  Rolling walker with 5" wheels;Other (comment)(already delivered)    Recommendations for Other Services       Precautions / Restrictions Precautions Precautions: Knee Precaution Booklet Issued: Yes (comment) Precaution Comments: Reviewed positoning of LE and not placing pillow under knee Required Braces or Orthoses: Knee Immobilizer - Left Knee Immobilizer - Left: Other (comment)(not specified) Restrictions LLE Weight Bearing: Weight bearing as tolerated    Mobility  Bed Mobility Overal bed mobility: Needs Assistance Bed Mobility: Sit to Supine       Sit to supine: Supervision   General bed mobility comments: Pt able to lift leg up onto bed without A  Transfers Overall transfer level: Needs assistance Equipment used: Rolling walker (2 wheeled) Transfers: Sit to/from Stand Sit to Stand: Min guard         General transfer comment: min/guard for safety, due to low BP all day  Ambulation/Gait Ambulation/Gait assistance: Min guard Ambulation Distance (Feet): 140 Feet Assistive device:  Rolling walker (2 wheeled) Gait Pattern/deviations: Step-to pattern;Trunk flexed     General Gait Details: Step to pattern with cues for posture and step length   Stairs            Wheelchair Mobility    Modified Rankin (Stroke Patients Only)       Balance Overall balance assessment: Needs assistance   Sitting balance-Leahy Scale: Fair     Standing balance support: Bilateral upper extremity supported Standing balance-Leahy Scale: Poor Standing balance comment: requires UE support                            Cognition Arousal/Alertness: Awake/alert Behavior During Therapy: WFL for tasks assessed/performed Overall Cognitive Status: Within Functional Limits for tasks assessed                                        Exercises      General Comments General comments (skin integrity, edema, etc.): no dizziness or lightheadedness during session      Pertinent Vitals/Pain Pain Assessment: 0-10 Pain Score: 7  Pain Location: posterior knee and lower leg Pain Descriptors / Indicators: Aching Pain Intervention(s): Limited activity within patient's tolerance;Monitored during session;Repositioned;Ice applied    Home Living                      Prior Function  PT Goals (current goals can now be found in the care plan section) Acute Rehab PT Goals Patient Stated Goal: go home when able PT Goal Formulation: With patient/family Time For Goal Achievement: 08/04/17 Potential to Achieve Goals: Good Progress towards PT goals: Progressing toward goals    Frequency    7X/week      PT Plan Current plan remains appropriate    Co-evaluation              AM-PAC PT "6 Clicks" Daily Activity  Outcome Measure  Difficulty turning over in bed (including adjusting bedclothes, sheets and blankets)?: None Difficulty moving from lying on back to sitting on the side of the bed? : A Little Difficulty sitting down on and  standing up from a chair with arms (e.g., wheelchair, bedside commode, etc,.)?: A Little Help needed moving to and from a bed to chair (including a wheelchair)?: A Little Help needed walking in hospital room?: A Little Help needed climbing 3-5 steps with a railing? : A Little 6 Click Score: 19    End of Session Equipment Utilized During Treatment: Gait belt;Left knee immobilizer Activity Tolerance: Patient tolerated treatment well Patient left: in bed;with call bell/phone within reach;with family/visitor present   PT Visit Diagnosis: Muscle weakness (generalized) (M62.81);Difficulty in walking, not elsewhere classified (R26.2)     Time: 0388-8280 PT Time Calculation (min) (ACUTE ONLY): 22 min  Charges:  $Gait Training: 8-22 mins                    G Codes:       Tom Norton, [place] Pager [phone] 07/21/2017    Tom Norton 07/21/2017, 3:55 PM

## 2017-07-21 NOTE — Evaluation (Signed)
Physical Therapy Evaluation Patient Details Name: Tom Norton MRN: [id number] DOB: [birth year] Today's Date: 07/21/2017   History of Present Illness  84 y/o male s/p L TKA (07/20/17)  Clinical Impression  Pt admitted with above diagnosis. Pt currently with functional limitations due to the deficits listed below (see PT Problem List).  Pt will benefit from skilled PT to increase their independence and safety with mobility to allow discharge to the venue listed below.  Pt moving well and has supportive wife.  Will need stair training prior to d/c home.  Will need RW.     Follow Up Recommendations Follow surgeon's recommendation for DC plan and follow-up therapies    Equipment Recommendations  Rolling walker with 5" wheels    Recommendations for Other Services       Precautions / Restrictions Precautions Precautions: Knee Precaution Booklet Issued: Yes (comment) Restrictions Weight Bearing Restrictions: Yes LLE Weight Bearing: Weight bearing as tolerated      Mobility  Bed Mobility Overal bed mobility: Needs Assistance Bed Mobility: Supine to Sit     Supine to sit: Min assist;Min guard     General bed mobility comments: Cues for technique.  Transfers Overall transfer level: Needs assistance Equipment used: Rolling walker (2 wheeled) Transfers: Sit to/from Stand Sit to Stand: Min guard         General transfer comment: cues for hand placement  Ambulation/Gait Ambulation/Gait assistance: Min guard;+2 safety/equipment Ambulation Distance (Feet): 75 Feet Assistive device: Rolling walker (2 wheeled) Gait Pattern/deviations: Step-to pattern;Trunk flexed     General Gait Details: Step to pattern with cues for posture and step length  Stairs            Wheelchair Mobility    Modified Rankin (Stroke Patients Only)       Balance Overall balance assessment: Needs assistance   Sitting balance-Leahy Scale: Fair     Standing balance support: Bilateral  upper extremity supported Standing balance-Leahy Scale: Poor Standing balance comment: requires UE support                             Pertinent Vitals/Pain Pain Assessment: 0-10 Pain Score: 0-No pain Pain Intervention(s): Premedicated before session;Monitored during session;Limited activity within patient's tolerance;Ice applied    Home Living Family/patient expects to be discharged to:: Private residence Living Arrangements: Spouse/significant other Available Help at Discharge: Family Type of Home: House Home Access: Stairs to enter Entrance Stairs-Rails: Psychiatric nurse of Steps: 4 Home Layout: Full bath on main level;Two level Home Equipment: Shower seat - built in;Bedside commode      Prior Function Level of Independence: Independent               Hand Dominance        Extremity/Trunk Assessment   Upper Extremity Assessment Upper Extremity Assessment: Overall WFL for tasks assessed    Lower Extremity Assessment Lower Extremity Assessment: LLE deficits/detail;Overall WFL for tasks assessed LLE Deficits / Details: L knee good quad set with ability to do SLR.  knee ROM flexion ~ 80 degrees with no c/o pain       Communication   Communication: No difficulties  Cognition Arousal/Alertness: Awake/alert Behavior During Therapy: WFL for tasks assessed/performed Overall Cognitive Status: Within Functional Limits for tasks assessed  General Comments General comments (skin integrity, edema, etc.): Pt with no c/o dizziness throughout the session.  No reports of pain either, but had pain meds prior to session.    Exercises Total Joint Exercises Ankle Circles/Pumps: AROM Quad Sets: Strengthening;Left;10 reps;Supine Heel Slides: AROM;Left;10 reps;Supine Hip ABduction/ADduction: AROM;Left;10 reps;Supine Straight Leg Raises: AROM;Left;10 reps;Supine   Assessment/Plan    PT  Assessment Patient needs continued PT services  PT Problem List Decreased strength;Decreased range of motion;Decreased balance;Decreased mobility;Decreased knowledge of use of DME       PT Treatment Interventions DME instruction;Gait training;Stair training;Functional mobility training;Balance training;Therapeutic exercise;Therapeutic activities;Patient/family education    PT Goals (Current goals can be found in the Care Plan section)  Acute Rehab PT Goals Patient Stated Goal: go home when able PT Goal Formulation: With patient/family Time For Goal Achievement: 08/04/17 Potential to Achieve Goals: Good    Frequency 7X/week   Barriers to discharge        Co-evaluation               AM-PAC PT "6 Clicks" Daily Activity  Outcome Measure Difficulty turning over in bed (including adjusting bedclothes, sheets and blankets)?: None Difficulty moving from lying on back to sitting on the side of the bed? : Unable Difficulty sitting down on and standing up from a chair with arms (e.g., wheelchair, bedside commode, etc,.)?: A Little Help needed moving to and from a bed to chair (including a wheelchair)?: A Little Help needed walking in hospital room?: A Little Help needed climbing 3-5 steps with a railing? : A Little 6 Click Score: 17    End of Session Equipment Utilized During Treatment: Gait belt;Left knee immobilizer Activity Tolerance: Patient tolerated treatment well Patient left: in chair;with call bell/phone within reach;with chair alarm set;with family/visitor present Nurse Communication: Mobility status PT Visit Diagnosis: Muscle weakness (generalized) (M62.81);Difficulty in walking, not elsewhere classified (R26.2)    Time: 5573-2202 PT Time Calculation (min) (ACUTE ONLY): 27 min   Charges:   PT Evaluation $PT Eval Moderate Complexity: 1 Mod PT Treatments $Gait Training: 8-22 mins   PT G Codes:        Lasya Vetter L. Tamala Julian, [place] Pager [phone] 07/21/2017   Galen Manila 07/21/2017, 11:49 AM

## 2017-07-21 NOTE — Progress Notes (Addendum)
Subjective: 1 Day Post-Op Procedure(s) (LRB): LEFT TOTAL KNEE ARTHROPLASTY (Left)  Patient reports pain as mild to moderate.  Resting comfortably in bed.  Reports that he is ready to eat breakfast.  Denies fever, chills, N/V, SOB, CP.  Objective:   VITALS:  Temp:  [97.4 F (36.3 C)-98.1 F (36.7 C)] 98 F (36.7 C) (03/30 0437) Pulse Rate:  [53-94] 76 (03/30 0437) Resp:  [8-24] 14 (03/30 0437) BP: (90-151)/(55-91) 90/55 (03/30 0437) SpO2:  [93 %-100 %] 99 % (03/30 0437) Weight:  [98.9 kg (218 lb)] 98.9 kg (218 lb) (03/29 1341)  General: WDWN patient in NAD. Psych:  Appropriate mood and affect. Neuro:  A&O x 3, Moving all extremities, sensation intact to light touch HEENT:  EOMs intact Chest:  Even non-labored respirations Skin:  Aquacel dressing C/D/I, no rashes or lesions.  Drain has serosanguinous fluid. Extremities: warm/dry, mild edema, no erythmea or echymosis.  No lymphadenopathy. Pulses: Popliteus 2+ MSK:  ROM: lacks 5 degrees of TKE, MMT: able to perform quad set, (-) Homan's    LABS Recent Labs    07/21/17 0511  HGB 11.9*  WBC 8.9  PLT 172   Recent Labs    07/21/17 0511  NA 142  K 4.0  CL 110  CO2 26  BUN 20  CREATININE 1.28*  GLUCOSE 168*   Recent Labs    07/20/17 1340  INR 1.09     Assessment/Plan: 1 Day Post-Op Procedure(s) (LRB): LEFT TOTAL KNEE ARTHROPLASTY (Left)  Patient seen in rounds for Dr. Freddy Finner pulled and dressing applied. WBAT L LE Up with therapy Plan for D/C home tomorrow. Scripts on chart Plan for outpatient post-op visit with Dr. Weyman Croon, PA-C [facility] Office:  [phone]

## 2017-07-21 NOTE — Care Management Note (Addendum)
Case Management Note  Patient Details  Name: MOHAMAD BRUSO MRN: [id number] Date of Birth: [birth year]  Subjective/Objective:    Left TKA               Action/Plan: NCM spoke to pt and offered choice for [facility]. Pt agreeable to [facility] for [facility]. (preoperatively arranged from surgeon's office). Pt requesting RW for home. Contacted [facility] to deliver RW to room.   07/22/2017 NCM spoke to pt and wife at bedside and he is arranged to have Outpt PT. Contacted [facility] to cancel HHPT referral.     Expected Discharge Date:  07/20/17               Expected Discharge Plan:  [place]  In-House Referral:  NA  Discharge planning Services  CM Consult  Post Acute Care Choice:  Home Health Choice offered to:  Patient  DME Arranged:  Walker rolling DME Agency:  [place]:  PT [place] Agency:  [facility] ([facility])  Status of Service:  Completed, signed off  If discussed at [employer], dates discussed:    Additional Comments:  Erenest Rasher, RN 07/21/2017, 10:24 AM

## 2017-07-22 LAB — CBC
HCT: 29 % — ABNORMAL LOW (ref 39.0–52.0)
HEMOGLOBIN: 9.9 g/dL — AB (ref 13.0–17.0)
MCH: 32 pg (ref 26.0–34.0)
MCHC: 34.1 g/dL (ref 30.0–36.0)
MCV: 93.9 fL (ref 78.0–100.0)
PLATELETS: 155 10*3/uL (ref 150–400)
RBC: 3.09 MIL/uL — AB (ref 4.22–5.81)
RDW: 13.2 % (ref 11.5–15.5)
WBC: 10.3 10*3/uL (ref 4.0–10.5)

## 2017-07-22 NOTE — Progress Notes (Signed)
   Subjective: 2 Days Post-Op Procedure(s) (LRB): LEFT TOTAL KNEE ARTHROPLASTY (Left)  Pt doing fairly well Pain is mild  Ready for d/c home after therapy today Denies any new issues or symptoms Patient reports pain as mild.  Objective:   VITALS:   Vitals:   07/21/17 2105 07/22/17 0628  BP: (!) 110/59 116/68  Pulse: 97 69  Resp: 18 16  Temp: 97.9 F (36.6 C) 98 F (36.7 C)  SpO2: 93% 95%    Left knee: dressing intact Ace bandage removed No signs of erythema or drainage nv intact distally Weight bearing as tolerated  LABS Recent Labs    07/21/17 0511 07/22/17 0503  HGB 11.9* 9.9*  HCT 35.5* 29.0*  WBC 8.9 10.3  PLT 172 155    Recent Labs    07/21/17 0511  NA 142  K 4.0  BUN 20  CREATININE 1.28*  GLUCOSE 168*     Assessment/Plan: 2 Days Post-Op Procedure(s) (LRB): LEFT TOTAL KNEE ARTHROPLASTY (Left) D/c home today after therapy F/u in 2 weeks Activity as tolerated Pain management as needed   Kathrynn Speed, MPAS [place] [facility] [employer] [facility] [address] [place], [place], [place] [address] Phone: [phone] [email or web address] Facebook  [employer]

## 2017-07-22 NOTE — Progress Notes (Signed)
Physical Therapy Treatment Patient Details Name: Tom Norton MRN: [id number] DOB: November 12, 1929 Today's Date: 07/22/2017    History of Present Illness 84 y/o male s/p L TKA (07/20/17)    PT Comments    Patient prepping for d/c today.  Able to negotiate steps with rail and cane appropriate for home entry.  Did caution about uneven walkway to use walker with assistance and take his time.  Also educated slower pace to improve safety as pt somewhat impulsive.  Encouraged wife to guard on stairs for safety and educated how to perform for her safety as well.  No further skilled acute PT needs.  Continue with PT post acute stay per MD recommendations.    Follow Up Recommendations  Follow surgeon's recommendation for DC plan and follow-up therapies     Equipment Recommendations  Rolling walker with 5" wheels;Other (comment)    Recommendations for Other Services       Precautions / Restrictions Precautions Precautions: Knee;Fall Precaution Comments: educated in using immobilizer for sleeping if side sleeper Required Braces or Orthoses: Knee Immobilizer - Left Restrictions LLE Weight Bearing: Weight bearing as tolerated    Mobility  Bed Mobility               General bed mobility comments: up in chair  Transfers Overall transfer level: Needs assistance Equipment used: Rolling walker (2 wheeled) Transfers: Sit to/from Stand Sit to Stand: Supervision         General transfer comment: for safety due to pt impulsive and standing without AD and moving in room without assist, wife and pt educated in safety with walker and assist initially; verbally reviewed technique for car transfers  Ambulation/Gait Ambulation/Gait assistance: Supervision Ambulation Distance (Feet): 200 Feet Assistive device: Rolling walker (2 wheeled) Gait Pattern/deviations: Step-through pattern;Decreased stride length;Antalgic;Trunk flexed     General Gait Details: cues for posture   Stairs Stairs:  Yes   Stair Management: Forwards;One rail Right;Step to pattern;With cane Number of Stairs: 4 General stair comments: educated wife and pt on technique, switching walker with cane, using step to pattern and sequence  Wheelchair Mobility    Modified Rankin (Stroke Patients Only)       Balance Overall balance assessment: Needs assistance Sitting-balance support: No upper extremity supported Sitting balance-Leahy Scale: Good     Standing balance support: No upper extremity supported Standing balance-Leahy Scale: Fair Standing balance comment: static standing without walker, adjusted walker for height                            Cognition Arousal/Alertness: Awake/alert Behavior During Therapy: WFL for tasks assessed/performed Overall Cognitive Status: Within Functional Limits for tasks assessed                                        Exercises Total Joint Exercises Ankle Circles/Pumps: AROM;10 reps;Both;Seated Quad Sets: Strengthening;Left;10 reps;Seated Short Arc Quad: AROM;Left;10 reps;Seated Heel Slides: AROM;Left;10 reps;Seated;AAROM Hip ABduction/ADduction: AROM;Left;10 reps;Seated Straight Leg Raises: AROM;Left;10 reps;Seated Goniometric ROM: 5 - 60    General Comments        Pertinent Vitals/Pain Pain Score: 5  Pain Location: knee Pain Descriptors / Indicators: Sore;Tender Pain Intervention(s): Monitored during session;Repositioned;Ice applied    Home Living                      Prior Function  PT Goals (current goals can now be found in the care plan section) Progress towards PT goals: Progressing toward goals    Frequency    7X/week      PT Plan Current plan remains appropriate    Co-evaluation              AM-PAC PT "6 Clicks" Daily Activity  Outcome Measure  Difficulty turning over in bed (including adjusting bedclothes, sheets and blankets)?: None Difficulty moving from lying on back  to sitting on the side of the bed? : A Little Difficulty sitting down on and standing up from a chair with arms (e.g., wheelchair, bedside commode, etc,.)?: A Little Help needed moving to and from a bed to chair (including a wheelchair)?: A Little Help needed walking in hospital room?: A Little Help needed climbing 3-5 steps with a railing? : A Little 6 Click Score: 19    End of Session Equipment Utilized During Treatment: Gait belt Activity Tolerance: Patient tolerated treatment well Patient left: with call bell/phone within reach;in chair;with family/visitor present Nurse Communication: Other (comment)(ready for d/c) PT Visit Diagnosis: Muscle weakness (generalized) (M62.81);Difficulty in walking, not elsewhere classified (R26.2)     Time: 3785-8850 PT Time Calculation (min) (ACUTE ONLY): 19 min  Charges:  $Gait Training: 8-22 mins                    G CodesMagda Kiel, [place] [phone] 07/22/2017    Reginia Naas 07/22/2017, 12:56 PM

## 2017-07-22 NOTE — Discharge Summary (Signed)
Physician Discharge Summary  Patient ID: Tom Norton MRN: [id number] DOB/AGE: 03-Jun-1929 84 y.o.  Admit date: 07/20/2017 Discharge date:  07/22/17  Procedures:  Procedure(s) (LRB): LEFT TOTAL KNEE ARTHROPLASTY (Left)  Attending Physician:  Dr. Theda Sers  Admission Diagnoses:   Left knee end stage osteoarthritis  Discharge Diagnoses:  Active Problems:   Primary osteoarthritis of left knee   S/P knee replacement  Past Medical History:  Diagnosis Date  . Acute renal insufficiency    a. 09/2013: dehydration.  . Anemia    history childhood  . Ankle fracture   . Cardiomegaly   . Dilated aortic root ([place])    a. 09/2013: 4.2cm by echo.  . Enlarged prostate without lower urinary tract symptoms (luts)   . GERD (gastroesophageal reflux disease)    rare  . High cholesterol   . History of hiatal hernia   . Hyperlipidemia   . Hypertension   . Hypotension    a. 09/2013 adm felt due to dehydration.  . Hypothyroidism   . Insomnia   . Junctional bradycardia   . Left arm numbness    Will see Dr. Jannifer Franklin July 17, 2017  . OA (osteoarthritis)   . Obesity   . Paroxysmal A-fib ([place])    a. 09/23/13 a-fib with RVR and hypotensive, requiring emergent cardioversion in ED  . Peripheral neuropathy 07/17/2017  . Sinus bradycardia   . Tick bite   . Vitamin D deficiency       PCP: Josetta Huddle, MD   Discharged Condition: good  Hospital Course:  Patient underwent the above stated procedure on 07/20/2017. Patient tolerated the procedure well and brought to the recovery room in good condition and subsequently to the floor.   Discharge Exam:   Disposition: Discharge disposition: 01-Home or Self Care      with follow up in 2 weeks     Discharge Instructions    Call MD / Call 911   Complete by:  As directed    If you experience chest pain or shortness of breath, CALL 911 and be transported to the hospital emergency room.  If you develope a fever above 101 F, pus (white drainage) or  increased drainage or redness at the wound, or calf pain, call your surgeon's office.   Call MD / Call 911   Complete by:  As directed    If you experience chest pain or shortness of breath, CALL 911 and be transported to the hospital emergency room.  If you develope a fever above 101 F, pus (white drainage) or increased drainage or redness at the wound, or calf pain, call your surgeon's office.   Constipation Prevention   Complete by:  As directed    Drink plenty of fluids.  Prune juice may be helpful.  You may use a stool softener, such as Colace (over the counter) 100 mg twice a day.  Use MiraLax (over the counter) for constipation as needed.   Constipation Prevention   Complete by:  As directed    Drink plenty of fluids.  Prune juice may be helpful.  You may use a stool softener, such as Colace (over the counter) 100 mg twice a day.  Use MiraLax (over the counter) for constipation as needed.   Diet - low sodium heart healthy   Complete by:  As directed    Diet - low sodium heart healthy   Complete by:  As directed    Discharge instructions   Complete by:  As directed  INSTRUCTIONS AFTER JOINT REPLACEMENT   Remove items at home which could result in a fall. This includes throw rugs or furniture in walking pathways ICE to the affected joint every three hours while awake for 30 minutes at a time, for at least the first 3-5 days, and then as needed for pain and swelling.  Continue to use ice for pain and swelling. You may notice swelling that will progress down to the foot and ankle.  This is normal after surgery.  Elevate your leg when you are not up walking on it.   Continue to use the breathing machine you got in the hospital (incentive spirometer) which will help keep your temperature down.  It is common for your temperature to cycle up and down following surgery, especially at night when you are not up moving around and exerting yourself.  The breathing machine keeps your lungs expanded  and your temperature down.   DIET:  As you were doing prior to hospitalization, we recommend a well-balanced diet.  DRESSING / WOUND CARE / SHOWERING  Keep the surgical dressing until follow up.  The dressing is water proof, so you can shower without any extra covering.  IF THE DRESSING FALLS OFF or the wound gets wet inside, change the dressing with sterile gauze.  Please use good hand washing techniques before changing the dressing.  Do not use any lotions or creams on the incision until instructed by your surgeon.    ACTIVITY  Increase activity slowly as tolerated, but follow the weight bearing instructions below.   No driving for 6 weeks or until further direction given by your physician.  You cannot drive while taking narcotics.  No lifting or carrying greater than 10 lbs. until further directed by your surgeon. Avoid periods of inactivity such as sitting longer than an hour when not asleep. This helps prevent blood clots.  You may return to work once you are authorized by your doctor.     WEIGHT BEARING   Weight bearing as tolerated with assist device (walker, cane, etc) as directed, use it as long as suggested by your surgeon or therapist, typically at least 4-6 weeks.   EXERCISES  Results after joint replacement surgery are often greatly improved when you follow the exercise, range of motion and muscle strengthening exercises prescribed by your doctor. Safety measures are also important to protect the joint from further injury. Any time any of these exercises cause you to have increased pain or swelling, decrease what you are doing until you are comfortable again and then slowly increase them. If you have problems or questions, call your caregiver or physical therapist for advice.   Rehabilitation is important following a joint replacement. After just a few days of immobilization, the muscles of the leg can become weakened and shrink (atrophy).  These exercises are designed to  build up the tone and strength of the thigh and leg muscles and to improve motion. Often times heat used for twenty to thirty minutes before working out will loosen up your tissues and help with improving the range of motion but do not use heat for the first two weeks following surgery (sometimes heat can increase post-operative swelling).   These exercises can be done on a training (exercise) mat, on the floor, on a table or on a bed. Use whatever works the best and is most comfortable for you.    Use music or television while you are exercising so that the exercises are a pleasant break in  your day. This will make your life better with the exercises acting as a break in your routine that you can look forward to.   Perform all exercises about fifteen times, three times per day or as directed.  You should exercise both the operative leg and the other leg as well.   Exercises include:   Quad Sets - Tighten up the muscle on the front of the thigh (Quad) and hold for 5-10 seconds.   Straight Leg Raises - With your knee straight (if you were given a brace, keep it on), lift the leg to 60 degrees, hold for 3 seconds, and slowly lower the leg.  Perform this exercise against resistance later as your leg gets stronger.  Leg Slides: Lying on your back, slowly slide your foot toward your buttocks, bending your knee up off the floor (only go as far as is comfortable). Then slowly slide your foot back down until your leg is flat on the floor again.  Angel Wings: Lying on your back spread your legs to the side as far apart as you can without causing discomfort.  Hamstring Strength:  Lying on your back, push your heel against the floor with your leg straight by tightening up the muscles of your buttocks.  Repeat, but this time bend your knee to a comfortable angle, and push your heel against the floor.  You may put a pillow under the heel to make it more comfortable if necessary.   A rehabilitation program following  joint replacement surgery can speed recovery and prevent re-injury in the future due to weakened muscles. Contact your doctor or a physical therapist for more information on knee rehabilitation.    CONSTIPATION  Constipation is defined medically as fewer than three stools per week and severe constipation as less than one stool per week.  Even if you have a regular bowel pattern at home, your normal regimen is likely to be disrupted due to multiple reasons following surgery.  Combination of anesthesia, postoperative narcotics, change in appetite and fluid intake all can affect your bowels.   YOU MUST use at least one of the following options; they are listed in order of increasing strength to get the job done.  They are all available over the counter, and you may need to use some, POSSIBLY even all of these options:    Drink plenty of fluids (prune juice may be helpful) and high fiber foods Colace 100 mg by mouth twice a day  Senokot for constipation as directed and as needed Dulcolax (bisacodyl), take with full glass of water  Miralax (polyethylene glycol) once or twice a day as needed.  If you have tried all these things and are unable to have a bowel movement in the first 3-4 days after surgery call either your surgeon or your primary doctor.    If you experience loose stools or diarrhea, hold the medications until you stool forms back up.  If your symptoms do not get better within 1 week or if they get worse, check with your doctor.  If you experience "the worst abdominal pain ever" or develop nausea or vomiting, please contact the office immediately for further recommendations for treatment.   ITCHING:  If you experience itching with your medications, try taking only a single pain pill, or even half a pain pill at a time.  You can also use Benadryl over the counter for itching or also to help with sleep.   TED HOSE STOCKINGS:  Use stockings on  both legs until for at least 2 weeks or as  directed by physician office. They may be removed at night for sleeping.  MEDICATIONS:  See your medication summary on the "After Visit Summary" that nursing will review with you.  You may have some home medications which will be placed on hold until you complete the course of blood thinner medication.  It is important for you to complete the blood thinner medication as prescribed.  PRECAUTIONS:  If you experience chest pain or shortness of breath - call 911 immediately for transfer to the hospital emergency department.   If you develop a fever greater that 101 F, purulent drainage from wound, increased redness or drainage from wound, foul odor from the wound/dressing, or calf pain - CONTACT YOUR SURGEON.                                                   FOLLOW-UP APPOINTMENTS:  If you do not already have a post-op appointment, please call the office for an appointment to be seen by your surgeon.  Guidelines for how soon to be seen are listed in your "After Visit Summary", but are typically between 1-4 weeks after surgery.  OTHER INSTRUCTIONS:   Knee Replacement:  Do not place pillow under knee, focus on keeping the knee straight while resting. CPM instructions: 0-90 degrees, 2 hours in the morning, 2 hours in the afternoon, and 2 hours in the evening. Place foam block, curve side up under heel at all times except when in CPM or when walking.  DO NOT modify, tear, cut, or change the foam block in any way.  MAKE SURE YOU:  Understand these instructions.  Get help right away if you are not doing well or get worse.    Thank you for letting us be a part of your medical care team.  It is a privilege we respect greatly.  We hope these instructions will help you stay on track for a fast and full recovery!   Increase activity slowly as tolerated   Complete by:  As directed    Increase activity slowly as tolerated   Complete by:  As directed       Allergies as of 07/22/2017      Reactions    Doxycycline Hyclate Anaphylaxis   Other Anaphylaxis   Bee stings      Medication List    TAKE these medications   apixaban 5 MG Tabs tablet Commonly known as:  ELIQUIS Take 1 tablet (5 mg total) by mouth 2 (two) times daily.   baclofen 10 MG tablet Commonly known as:  LIORESAL Take 1 tablet (10 mg total) by mouth 3 (three) times daily as needed for muscle spasms.   CINNAMON PO Take 1,000 mg by mouth daily.   Co Q10 100 MG Caps Take 100 mg by mouth daily.   diphenhydrAMINE 25 mg capsule Commonly known as:  BENADRYL Take 50 mg by mouth daily as needed for allergies.   EPINEPHrine 0.3 mg/0.3 mL Soaj injection Commonly known as:  EPI-PEN Inject 0.3 mg into the muscle daily as needed (allergic reaction).   eszopiclone 1 MG Tabs tablet Commonly known as:  LUNESTA Take 1-2 mg by mouth at bedtime. Take immediately before bedtime   levothyroxine 25 MCG tablet Commonly known as:  SYNTHROID, LEVOTHROID Take 25 mcg by mouth  daily before breakfast.   multivitamin with minerals Tabs tablet Take 1 tablet by mouth daily.   oxyCODONE 5 MG immediate release tablet Commonly known as:  ROXICODONE Take 1 tablet (5 mg total) by mouth every 4 (four) hours as needed for up to 7 days.   tamsulosin 0.4 MG Caps capsule Commonly known as:  FLOMAX Take 0.4 mg by mouth daily after supper.   UNABLE TO FIND Take 1 tablet by mouth every evening. Med Name: Protandim   Vitamin D3 2000 units Tabs Take 2,000 Units by mouth daily.            Durable Medical Equipment  (From admission, onward)        Start     Ordered   07/21/17 1024  For home use only DME Walker rolling  Once    Question:  Patient needs a walker to treat with the following condition  Answer:  S/P TKR (total knee replacement), left   07/21/17 1024       Brad Luna [place], MPAS Physician Assistant [facility] is now [facility] [address] [place], [place], [place] [address] Phone:  [phone] [email or web address] Facebook  [employer]

## 2017-07-22 NOTE — Discharge Summary (Signed)
Orthopedic Discharge Summary        Physician Discharge Summary  Patient ID: Tom Norton MRN: [id number] DOB/AGE: 1929-05-30 84 y.o.  Admit date: 07/20/2017 Discharge date: 07/22/2017   Procedures:  Procedure(s) (LRB): LEFT TOTAL KNEE ARTHROPLASTY (Left)  Attending Physician:  Dr. Theda Sers  Admission Diagnoses:   Left knee end stage osteoarthritis  Discharge Diagnoses:  Left knee end stage osteoarthritis   Past Medical History:  Diagnosis Date  . Acute renal insufficiency    a. 09/2013: dehydration.  . Anemia    history childhood  . Ankle fracture   . Cardiomegaly   . Dilated aortic root ([place])    a. 09/2013: 4.2cm by echo.  . Enlarged prostate without lower urinary tract symptoms (luts)   . GERD (gastroesophageal reflux disease)    rare  . High cholesterol   . History of hiatal hernia   . Hyperlipidemia   . Hypertension   . Hypotension    a. 09/2013 adm felt due to dehydration.  . Hypothyroidism   . Insomnia   . Junctional bradycardia   . Left arm numbness    Will see Dr. Jannifer Franklin July 17, 2017  . OA (osteoarthritis)   . Obesity   . Paroxysmal A-fib ([place])    a. 09/23/13 a-fib with RVR and hypotensive, requiring emergent cardioversion in ED  . Peripheral neuropathy 07/17/2017  . Sinus bradycardia   . Tick bite   . Vitamin D deficiency     PCP: Josetta Huddle, MD   Discharged Condition: good  Hospital Course:  Patient underwent the above stated procedure on 07/20/2017. Patient tolerated the procedure well and brought to the recovery room in good condition and subsequently to the floor. Patient had an uncomplicated hospital course and was stable for discharge.   Disposition: Discharge disposition: 01-Home or Self Care      with follow up in 2 weeks     Discharge Instructions    Call MD / Call 911   Complete by:  As directed    If you experience chest pain or shortness of breath, CALL 911 and be transported to the hospital emergency room.  If you  develope a fever above 101 F, pus (white drainage) or increased drainage or redness at the wound, or calf pain, call your surgeon's office.   Call MD / Call 911   Complete by:  As directed    If you experience chest pain or shortness of breath, CALL 911 and be transported to the hospital emergency room.  If you develope a fever above 101 F, pus (white drainage) or increased drainage or redness at the wound, or calf pain, call your surgeon's office.   Constipation Prevention   Complete by:  As directed    Drink plenty of fluids.  Prune juice may be helpful.  You may use a stool softener, such as Colace (over the counter) 100 mg twice a day.  Use MiraLax (over the counter) for constipation as needed.   Constipation Prevention   Complete by:  As directed    Drink plenty of fluids.  Prune juice may be helpful.  You may use a stool softener, such as Colace (over the counter) 100 mg twice a day.  Use MiraLax (over the counter) for constipation as needed.   Diet - low sodium heart healthy   Complete by:  As directed    Diet - low sodium heart healthy   Complete by:  As directed    Discharge instructions  Complete by:  As directed    INSTRUCTIONS AFTER JOINT REPLACEMENT   Remove items at home which could result in a fall. This includes throw rugs or furniture in walking pathways ICE to the affected joint every three hours while awake for 30 minutes at a time, for at least the first 3-5 days, and then as needed for pain and swelling.  Continue to use ice for pain and swelling. You may notice swelling that will progress down to the foot and ankle.  This is normal after surgery.  Elevate your leg when you are not up walking on it.   Continue to use the breathing machine you got in the hospital (incentive spirometer) which will help keep your temperature down.  It is common for your temperature to cycle up and down following surgery, especially at night when you are not up moving around and exerting  yourself.  The breathing machine keeps your lungs expanded and your temperature down.   DIET:  As you were doing prior to hospitalization, we recommend a well-balanced diet.  DRESSING / WOUND CARE / SHOWERING  Keep the surgical dressing until follow up.  The dressing is water proof, so you can shower without any extra covering.  IF THE DRESSING FALLS OFF or the wound gets wet inside, change the dressing with sterile gauze.  Please use good hand washing techniques before changing the dressing.  Do not use any lotions or creams on the incision until instructed by your surgeon.    ACTIVITY  Increase activity slowly as tolerated, but follow the weight bearing instructions below.   No driving for 6 weeks or until further direction given by your physician.  You cannot drive while taking narcotics.  No lifting or carrying greater than 10 lbs. until further directed by your surgeon. Avoid periods of inactivity such as sitting longer than an hour when not asleep. This helps prevent blood clots.  You may return to work once you are authorized by your doctor.     WEIGHT BEARING   Weight bearing as tolerated with assist device (walker, cane, etc) as directed, use it as long as suggested by your surgeon or therapist, typically at least 4-6 weeks.   EXERCISES  Results after joint replacement surgery are often greatly improved when you follow the exercise, range of motion and muscle strengthening exercises prescribed by your doctor. Safety measures are also important to protect the joint from further injury. Any time any of these exercises cause you to have increased pain or swelling, decrease what you are doing until you are comfortable again and then slowly increase them. If you have problems or questions, call your caregiver or physical therapist for advice.   Rehabilitation is important following a joint replacement. After just a few days of immobilization, the muscles of the leg can become weakened  and shrink (atrophy).  These exercises are designed to build up the tone and strength of the thigh and leg muscles and to improve motion. Often times heat used for twenty to thirty minutes before working out will loosen up your tissues and help with improving the range of motion but do not use heat for the first two weeks following surgery (sometimes heat can increase post-operative swelling).   These exercises can be done on a training (exercise) mat, on the floor, on a table or on a bed. Use whatever works the best and is most comfortable for you.    Use music or television while you are exercising so  that the exercises are a pleasant break in your day. This will make your life better with the exercises acting as a break in your routine that you can look forward to.   Perform all exercises about fifteen times, three times per day or as directed.  You should exercise both the operative leg and the other leg as well.   Exercises include:   Quad Sets - Tighten up the muscle on the front of the thigh (Quad) and hold for 5-10 seconds.   Straight Leg Raises - With your knee straight (if you were given a brace, keep it on), lift the leg to 60 degrees, hold for 3 seconds, and slowly lower the leg.  Perform this exercise against resistance later as your leg gets stronger.  Leg Slides: Lying on your back, slowly slide your foot toward your buttocks, bending your knee up off the floor (only go as far as is comfortable). Then slowly slide your foot back down until your leg is flat on the floor again.  Angel Wings: Lying on your back spread your legs to the side as far apart as you can without causing discomfort.  Hamstring Strength:  Lying on your back, push your heel against the floor with your leg straight by tightening up the muscles of your buttocks.  Repeat, but this time bend your knee to a comfortable angle, and push your heel against the floor.  You may put a pillow under the heel to make it more comfortable  if necessary.   A rehabilitation program following joint replacement surgery can speed recovery and prevent re-injury in the future due to weakened muscles. Contact your doctor or a physical therapist for more information on knee rehabilitation.    CONSTIPATION  Constipation is defined medically as fewer than three stools per week and severe constipation as less than one stool per week.  Even if you have a regular bowel pattern at home, your normal regimen is likely to be disrupted due to multiple reasons following surgery.  Combination of anesthesia, postoperative narcotics, change in appetite and fluid intake all can affect your bowels.   YOU MUST use at least one of the following options; they are listed in order of increasing strength to get the job done.  They are all available over the counter, and you may need to use some, POSSIBLY even all of these options:    Drink plenty of fluids (prune juice may be helpful) and high fiber foods Colace 100 mg by mouth twice a day  Senokot for constipation as directed and as needed Dulcolax (bisacodyl), take with full glass of water  Miralax (polyethylene glycol) once or twice a day as needed.  If you have tried all these things and are unable to have a bowel movement in the first 3-4 days after surgery call either your surgeon or your primary doctor.    If you experience loose stools or diarrhea, hold the medications until you stool forms back up.  If your symptoms do not get better within 1 week or if they get worse, check with your doctor.  If you experience "the worst abdominal pain ever" or develop nausea or vomiting, please contact the office immediately for further recommendations for treatment.   ITCHING:  If you experience itching with your medications, try taking only a single pain pill, or even half a pain pill at a time.  You can also use Benadryl over the counter for itching or also to help with sleep.  TED HOSE STOCKINGS:  Use  stockings on both legs until for at least 2 weeks or as directed by physician office. They may be removed at night for sleeping.  MEDICATIONS:  See your medication summary on the "After Visit Summary" that nursing will review with you.  You may have some home medications which will be placed on hold until you complete the course of blood thinner medication.  It is important for you to complete the blood thinner medication as prescribed.  PRECAUTIONS:  If you experience chest pain or shortness of breath - call 911 immediately for transfer to the hospital emergency department.   If you develop a fever greater that 101 F, purulent drainage from wound, increased redness or drainage from wound, foul odor from the wound/dressing, or calf pain - CONTACT YOUR SURGEON.                                                   FOLLOW-UP APPOINTMENTS:  If you do not already have a post-op appointment, please call the office for an appointment to be seen by your surgeon.  Guidelines for how soon to be seen are listed in your "After Visit Summary", but are typically between 1-4 weeks after surgery.  OTHER INSTRUCTIONS:   Knee Replacement:  Do not place pillow under knee, focus on keeping the knee straight while resting. CPM instructions: 0-90 degrees, 2 hours in the morning, 2 hours in the afternoon, and 2 hours in the evening. Place foam block, curve side up under heel at all times except when in CPM or when walking.  DO NOT modify, tear, cut, or change the foam block in any way.  MAKE SURE YOU:  Understand these instructions.  Get help right away if you are not doing well or get worse.    Thank you for letting us be a part of your medical care team.  It is a privilege we respect greatly.  We hope these instructions will help you stay on track for a fast and full recovery!   Increase activity slowly as tolerated   Complete by:  As directed    Increase activity slowly as tolerated   Complete by:  As directed           Signed: Ventura Bruns 07/22/2017, 8:08 AM  [facility] is now [employer] [address], [place], [place], [place] [address] Phone: [place]

## 2017-07-23 ENCOUNTER — Encounter (HOSPITAL_COMMUNITY): Payer: Self-pay | Admitting: Specialist

## 2017-07-25 DIAGNOSIS — M25562 Pain in left knee: Secondary | ICD-10-CM | POA: Diagnosis not present

## 2017-07-26 ENCOUNTER — Telehealth: Payer: Self-pay | Admitting: Neurology

## 2017-07-26 NOTE — Telephone Encounter (Signed)
Pt called to report he has noticed numbness in the left hand for the past 2 days. He said at the [place] this is was not an issue. Please call to advise if necessary

## 2017-07-26 NOTE — Telephone Encounter (Signed)
I called the patient.  The patient has had some numbness of the left hand over the last 2 days.  Prior nerve conduction studies show a generalized peripheral neuropathy but he did have bilateral mild carpal tunnel syndrome as well.  The numbness in the hand is likely related to the carpal tunnel syndrome.  If the numbness is bothersome, and particularly has discomfort, he will call me back, I will set up a referral to a hand surgeon.

## 2017-07-27 DIAGNOSIS — M25562 Pain in left knee: Secondary | ICD-10-CM | POA: Diagnosis not present

## 2017-07-30 DIAGNOSIS — M25562 Pain in left knee: Secondary | ICD-10-CM | POA: Diagnosis not present

## 2017-08-01 DIAGNOSIS — Z471 Aftercare following joint replacement surgery: Secondary | ICD-10-CM | POA: Diagnosis not present

## 2017-08-01 DIAGNOSIS — Z96652 Presence of left artificial knee joint: Secondary | ICD-10-CM | POA: Diagnosis not present

## 2017-08-01 DIAGNOSIS — M25562 Pain in left knee: Secondary | ICD-10-CM | POA: Diagnosis not present

## 2017-08-02 DIAGNOSIS — E782 Mixed hyperlipidemia: Secondary | ICD-10-CM | POA: Diagnosis not present

## 2017-08-02 DIAGNOSIS — M170 Bilateral primary osteoarthritis of knee: Secondary | ICD-10-CM | POA: Diagnosis not present

## 2017-08-02 DIAGNOSIS — I10 Essential (primary) hypertension: Secondary | ICD-10-CM | POA: Diagnosis not present

## 2017-08-02 DIAGNOSIS — I4891 Unspecified atrial fibrillation: Secondary | ICD-10-CM | POA: Diagnosis not present

## 2017-08-02 DIAGNOSIS — N400 Enlarged prostate without lower urinary tract symptoms: Secondary | ICD-10-CM | POA: Diagnosis not present

## 2017-08-02 DIAGNOSIS — E039 Hypothyroidism, unspecified: Secondary | ICD-10-CM | POA: Diagnosis not present

## 2017-08-02 DIAGNOSIS — M179 Osteoarthritis of knee, unspecified: Secondary | ICD-10-CM | POA: Diagnosis not present

## 2017-08-02 DIAGNOSIS — I480 Paroxysmal atrial fibrillation: Secondary | ICD-10-CM | POA: Diagnosis not present

## 2017-08-06 ENCOUNTER — Other Ambulatory Visit: Payer: Self-pay | Admitting: Cardiovascular Disease

## 2017-08-06 DIAGNOSIS — M25562 Pain in left knee: Secondary | ICD-10-CM | POA: Diagnosis not present

## 2017-08-09 DIAGNOSIS — M25562 Pain in left knee: Secondary | ICD-10-CM | POA: Diagnosis not present

## 2017-08-13 DIAGNOSIS — M25562 Pain in left knee: Secondary | ICD-10-CM | POA: Diagnosis not present

## 2017-08-16 DIAGNOSIS — M25562 Pain in left knee: Secondary | ICD-10-CM | POA: Diagnosis not present

## 2017-08-20 DIAGNOSIS — M25562 Pain in left knee: Secondary | ICD-10-CM | POA: Diagnosis not present

## 2017-08-23 DIAGNOSIS — M25562 Pain in left knee: Secondary | ICD-10-CM | POA: Diagnosis not present

## 2017-08-27 DIAGNOSIS — M25562 Pain in left knee: Secondary | ICD-10-CM | POA: Diagnosis not present

## 2017-08-30 NOTE — Telephone Encounter (Signed)
Pt requesting a call from Dr. Alvis Lemmings want to speak with RN), pt didn't not want to go into detail with me. Stating it is a follow up regarding his carpal tunnel.

## 2017-08-30 NOTE — Telephone Encounter (Signed)
I called the patient.  The patient does have bilateral carpal tunnel syndrome, he is mainly bothered with the left hand.  He is being followed by Dr. Hillery Aldo for his knee surgery, they have a hand surgeon that works with him, the patient may be seeing that physician regarding the carpal tunnel.

## 2017-08-31 DIAGNOSIS — M25562 Pain in left knee: Secondary | ICD-10-CM | POA: Diagnosis not present

## 2017-09-03 DIAGNOSIS — M25562 Pain in left knee: Secondary | ICD-10-CM | POA: Diagnosis not present

## 2017-09-06 DIAGNOSIS — M25562 Pain in left knee: Secondary | ICD-10-CM | POA: Diagnosis not present

## 2017-09-10 DIAGNOSIS — M25562 Pain in left knee: Secondary | ICD-10-CM | POA: Diagnosis not present

## 2017-09-12 DIAGNOSIS — M25562 Pain in left knee: Secondary | ICD-10-CM | POA: Diagnosis not present

## 2017-09-25 DIAGNOSIS — Z79899 Other long term (current) drug therapy: Secondary | ICD-10-CM | POA: Diagnosis not present

## 2017-09-25 DIAGNOSIS — Z0000 Encounter for general adult medical examination without abnormal findings: Secondary | ICD-10-CM | POA: Diagnosis not present

## 2017-09-25 DIAGNOSIS — E782 Mixed hyperlipidemia: Secondary | ICD-10-CM | POA: Diagnosis not present

## 2017-09-25 DIAGNOSIS — I10 Essential (primary) hypertension: Secondary | ICD-10-CM | POA: Diagnosis not present

## 2017-09-25 DIAGNOSIS — I480 Paroxysmal atrial fibrillation: Secondary | ICD-10-CM | POA: Diagnosis not present

## 2017-09-25 DIAGNOSIS — E039 Hypothyroidism, unspecified: Secondary | ICD-10-CM | POA: Diagnosis not present

## 2017-09-25 DIAGNOSIS — M170 Bilateral primary osteoarthritis of knee: Secondary | ICD-10-CM | POA: Diagnosis not present

## 2017-09-25 DIAGNOSIS — Z1389 Encounter for screening for other disorder: Secondary | ICD-10-CM | POA: Diagnosis not present

## 2017-09-25 DIAGNOSIS — M179 Osteoarthritis of knee, unspecified: Secondary | ICD-10-CM | POA: Diagnosis not present

## 2017-09-25 DIAGNOSIS — N400 Enlarged prostate without lower urinary tract symptoms: Secondary | ICD-10-CM | POA: Diagnosis not present

## 2017-10-10 DIAGNOSIS — M25662 Stiffness of left knee, not elsewhere classified: Secondary | ICD-10-CM | POA: Diagnosis not present

## 2017-10-12 DIAGNOSIS — M170 Bilateral primary osteoarthritis of knee: Secondary | ICD-10-CM | POA: Diagnosis not present

## 2017-10-12 DIAGNOSIS — I4891 Unspecified atrial fibrillation: Secondary | ICD-10-CM | POA: Diagnosis not present

## 2017-10-12 DIAGNOSIS — N400 Enlarged prostate without lower urinary tract symptoms: Secondary | ICD-10-CM | POA: Diagnosis not present

## 2017-10-12 DIAGNOSIS — E782 Mixed hyperlipidemia: Secondary | ICD-10-CM | POA: Diagnosis not present

## 2017-10-12 DIAGNOSIS — I480 Paroxysmal atrial fibrillation: Secondary | ICD-10-CM | POA: Diagnosis not present

## 2017-10-12 DIAGNOSIS — E039 Hypothyroidism, unspecified: Secondary | ICD-10-CM | POA: Diagnosis not present

## 2017-10-12 DIAGNOSIS — M179 Osteoarthritis of knee, unspecified: Secondary | ICD-10-CM | POA: Diagnosis not present

## 2017-10-12 DIAGNOSIS — I10 Essential (primary) hypertension: Secondary | ICD-10-CM | POA: Diagnosis not present

## 2017-10-15 DIAGNOSIS — M79642 Pain in left hand: Secondary | ICD-10-CM | POA: Diagnosis not present

## 2017-10-15 DIAGNOSIS — G5603 Carpal tunnel syndrome, bilateral upper limbs: Secondary | ICD-10-CM | POA: Diagnosis not present

## 2017-10-22 DIAGNOSIS — M542 Cervicalgia: Secondary | ICD-10-CM | POA: Diagnosis not present

## 2017-10-22 DIAGNOSIS — M79642 Pain in left hand: Secondary | ICD-10-CM | POA: Diagnosis not present

## 2017-10-31 DIAGNOSIS — M79602 Pain in left arm: Secondary | ICD-10-CM | POA: Diagnosis not present

## 2017-10-31 DIAGNOSIS — G5603 Carpal tunnel syndrome, bilateral upper limbs: Secondary | ICD-10-CM | POA: Diagnosis not present

## 2017-11-14 DIAGNOSIS — H2513 Age-related nuclear cataract, bilateral: Secondary | ICD-10-CM | POA: Diagnosis not present

## 2017-12-17 ENCOUNTER — Ambulatory Visit (INDEPENDENT_AMBULATORY_CARE_PROVIDER_SITE_OTHER): Payer: Medicare Other | Admitting: Cardiovascular Disease

## 2017-12-17 ENCOUNTER — Encounter: Payer: Self-pay | Admitting: Cardiovascular Disease

## 2017-12-17 VITALS — BP 112/80 | HR 70 | Ht 66.0 in | Wt 207.0 lb

## 2017-12-17 DIAGNOSIS — Z7901 Long term (current) use of anticoagulants: Secondary | ICD-10-CM

## 2017-12-17 DIAGNOSIS — I480 Paroxysmal atrial fibrillation: Secondary | ICD-10-CM | POA: Diagnosis not present

## 2017-12-17 NOTE — Progress Notes (Signed)
Cardiology Office Note:    Date:  12/19/2017   ID:  Tom Norton, DOB [birth year], MRN [id number]  PCP:  Tom Huddle, MD  Cardiologist:  Tom Klein, MD    Referring MD: Tom Huddle, MD   Chief Complaint  Patient presents with  . Follow-up    12 months.  . Edema    Left ankle swell at times.  Atrial fibrillation  History of Present Illness:    Tom Norton is a 84 y.o. male with a hx of Infrequent episodes of paroxysmal atrial fibrillation, for which he required cardioversion in 2015. He does not have diabetes mellitus, hypertension, heart failure, valvular heart disease and the only abnormalities on his echocardiogram are biatrial dilatation and mild aortic root enlargement. There was no evidence of coronary insufficiency by nuclear stress test performed at the time of his cardioversion.  He has been poorly tolerant of beta blockers (hair loss, fatigue) and verapamil (fatigue). He is no longer taking any rate control medications. He is on anticoagulation with Eliquis and has never had an embolic event, TIA or stroke.   The patient specifically denies any chest pain at rest exertion, dyspnea at rest or with exertion, orthopnea, paroxysmal nocturnal dyspnea, syncope, palpitations, focal neurological deficits, intermittent claudication, lower extremity edema, unexplained weight gain, cough, hemoptysis or wheezing.  His biggest complaints are related to back pain.  He has undergone a left total knee repair from which he has recovered well.   Past Medical History:  Diagnosis Date  . Acute renal insufficiency    a. 09/2013: dehydration.  . Anemia    history childhood  . Ankle fracture   . Cardiomegaly   . Dilated aortic root ([place])    a. 09/2013: 4.2cm by echo.  . Enlarged prostate without lower urinary tract symptoms (luts)   . GERD (gastroesophageal reflux disease)    rare  . High cholesterol   . History of hiatal hernia   . Hyperlipidemia   . Hypertension   .  Hypotension    a. 09/2013 adm felt due to dehydration.  . Hypothyroidism   . Insomnia   . Junctional bradycardia   . Left arm numbness    Will see Tom Norton July 17, 2017  . OA (osteoarthritis)   . Obesity   . Paroxysmal A-fib ([place])    a. 09/23/13 a-fib with RVR and hypotensive, requiring emergent cardioversion in ED  . Peripheral neuropathy 07/17/2017  . Sinus bradycardia   . Tick bite   . Vitamin D deficiency     Past Surgical History:  Procedure Laterality Date  . CARDIOVERSION  2015  . HERNIA REPAIR    . TOTAL KNEE ARTHROPLASTY Left 07/20/2017   Procedure: LEFT TOTAL KNEE ARTHROPLASTY;  Surgeon: Tom Cabal, MD;  Location: [facility];  Service: Orthopedics;  Laterality: Left;  Adductor Block  . TRANSTHORACIC ECHOCARDIOGRAM  09/23/2013   EF 55-60%; normal wall motion. Moderately dilated aortic root and ascending aorta; mild AI; moderate bi-atrial enlargement    Current Medications: Current Meds  Medication Sig  . Cholecalciferol (VITAMIN D3) 2000 units TABS Take 2,000 Units by mouth daily.  Marland Kitchen CINNAMON PO Take 1,000 mg by mouth daily.   . Coenzyme Q10 (CO Q10) 100 MG CAPS Take 100 mg by mouth daily.  . diphenhydrAMINE (BENADRYL) 25 mg capsule Take 50 mg by mouth daily as needed for allergies.  Marland Kitchen ELIQUIS 5 MG TABS tablet TAKE 1 TABLET BY MOUTH 2 TIMES DAILY.  Marland Kitchen EPINEPHrine 0.3 mg/0.3 mL  IJ SOAJ injection Inject 0.3 mg into the muscle daily as needed (allergic reaction).  . eszopiclone (LUNESTA) 1 MG TABS tablet Take 1-2 mg by mouth at bedtime. Take immediately before bedtime  . levothyroxine (SYNTHROID, LEVOTHROID) 25 MCG tablet Take 25 mcg by mouth daily before breakfast.  . Multiple Vitamin (MULTIVITAMIN WITH MINERALS) TABS Take 1 tablet by mouth daily.  . Tamsulosin HCl (FLOMAX) 0.4 MG CAPS Take 0.4 mg by mouth daily after supper.  Marland Kitchen UNABLE TO FIND Take 1 tablet by mouth every evening. Med Name: Protandim     Allergies:   Doxycycline hyclate and Other   Social History    Socioeconomic History  . Marital status: Married    Spouse name: Not on file  . Number of children: Not on file  . Years of education: Not on file  . Highest education level: Not on file  Occupational History  . Not on file  Social Needs  . Financial resource strain: Not on file  . Food insecurity:    Worry: Not on file    Inability: Not on file  . Transportation needs:    Medical: Not on file    Non-medical: Not on file  Tobacco Use  . Smoking status: Never Smoker  . Smokeless tobacco: Never Used  Substance and Sexual Activity  . Alcohol use: Yes    Alcohol/week: 10.0 standard drinks    Types: 10 Glasses of wine per week  . Drug use: No  . Sexual activity: Not on file  Lifestyle  . Physical activity:    Days per week: Not on file    Minutes per session: Not on file  . Stress: Not on file  Relationships  . Social connections:    Talks on phone: Not on file    Gets together: Not on file    Attends religious service: Not on file    Active member of club or organization: Not on file    Attends meetings of clubs or organizations: Not on file    Relationship status: Not on file  Other Topics Concern  . Not on file  Social History Narrative   Married. Tom Norton is a patient of Tom Norton.   At least one son.   Never smoked. Up to 10 glasses of wine a week     Family History: The patient's family history includes Heart disease in his brother and father; Stroke in his brother. ROS:   Please see the history of present illness.     All other systems reviewed and are negative.  EKGs/Labs/Other Studies Reviewed:    EKG:  EKG is ordered today.  The ekg ordered today demonstrates sinus rhythm, left axis deviation, no repolarization abnormalities, QTC 421 ms  Recent Labs: 07/21/2017: BUN 20; Creatinine, Ser 1.28; Potassium 4.0; Sodium 142 07/22/2017: Hemoglobin 9.9; Platelets 155  Recent Lipid Panel No results found for: CHOL, TRIG, HDL, CHOLHDL, VLDL, LDLCALC,  LDLDIRECT  Physical Exam:    VS:  BP 112/80 (BP Location: Left Arm, Patient Position: Sitting, Cuff Size: Normal)   Pulse 70   Ht 5\' 6"  (1.676 m)   Wt 207 lb (93.9 kg)   BMI 33.41 kg/m     Wt Readings from Last 3 Encounters:  12/17/17 207 lb (93.9 kg)  07/20/17 218 lb (98.9 kg)  07/10/17 218 lb 2 oz (98.9 kg)     General: Alert, oriented x3, no distress, mildly obese Head: no evidence of trauma, PERRL, EOMI, no exophtalmos or lid lag, no  myxedema, no xanthelasma; normal ears, nose and oropharynx Neck: normal jugular venous pulsations and no hepatojugular reflux; brisk carotid pulses without delay and no carotid bruits Chest: clear to auscultation, no signs of consolidation by percussion or palpation, normal fremitus, symmetrical and full respiratory excursions Cardiovascular: normal position and quality of the apical impulse, regular rhythm, normal first and second heart sounds, no murmurs, rubs or gallops Abdomen: no tenderness or distention, no masses by palpation, no abnormal pulsatility or arterial bruits, normal bowel sounds, no hepatosplenomegaly Extremities: no clubbing, cyanosis or edema; 2+ radial, ulnar and brachial pulses bilaterally; 2+ right femoral, posterior tibial and dorsalis pedis pulses; 2+ left femoral, posterior tibial and dorsalis pedis pulses; no subclavian or femoral bruits Neurological: grossly nonfocal Psych: Normal mood and affect  ASSESSMENT:    1. Paroxysmal atrial fibrillation (HCC)   2. Long term current use of anticoagulant therapy    PLAN:    In order of problems listed above:  1. AFib: No clinical defined recurrence since his cardioversion despite the fact that he is not taking any antiarrhythmics or even any rate control medications.  CHADSVasc 2 (age only). Compliant with anticoagulation.  2. Eliquis: No bleeding complications, well-tolerated.   Medication Adjustments/Labs and Tests Ordered: Current medicines are reviewed at length with the  patient today.  Concerns regarding medicines are outlined above.  No orders of the defined types were placed in this encounter.  No orders of the defined types were placed in this encounter.   Signed, Tom Klein, MD  12/19/2017 5:23 PM    [place]

## 2017-12-17 NOTE — Patient Instructions (Signed)
Dr Croitoru recommends that you schedule a follow-up appointment in 12 months. You will receive a reminder letter in the mail two months in advance. If you don't receive a letter, please call our office to schedule the follow-up appointment.  If you need a refill on your cardiac medications before your next appointment, please call your pharmacy. 

## 2018-01-16 DIAGNOSIS — M1712 Unilateral primary osteoarthritis, left knee: Secondary | ICD-10-CM | POA: Diagnosis not present

## 2018-01-29 ENCOUNTER — Ambulatory Visit: Payer: Self-pay | Admitting: Pharmacist Clinician (PhC)/ Clinical Pharmacy Specialist

## 2018-01-29 DIAGNOSIS — I4891 Unspecified atrial fibrillation: Secondary | ICD-10-CM

## 2018-01-29 DIAGNOSIS — Z7901 Long term (current) use of anticoagulants: Secondary | ICD-10-CM

## 2018-02-04 ENCOUNTER — Other Ambulatory Visit: Payer: Self-pay | Admitting: Cardiovascular Disease

## 2018-02-12 DIAGNOSIS — L570 Actinic keratosis: Secondary | ICD-10-CM | POA: Diagnosis not present

## 2018-02-20 DIAGNOSIS — Z23 Encounter for immunization: Secondary | ICD-10-CM | POA: Diagnosis not present

## 2018-03-05 DIAGNOSIS — M545 Low back pain: Secondary | ICD-10-CM | POA: Diagnosis not present

## 2018-03-05 DIAGNOSIS — M5416 Radiculopathy, lumbar region: Secondary | ICD-10-CM | POA: Diagnosis not present

## 2018-03-05 DIAGNOSIS — M5136 Other intervertebral disc degeneration, lumbar region: Secondary | ICD-10-CM | POA: Diagnosis not present

## 2018-03-15 DIAGNOSIS — M5416 Radiculopathy, lumbar region: Secondary | ICD-10-CM | POA: Diagnosis not present

## 2018-03-27 DIAGNOSIS — M545 Low back pain: Secondary | ICD-10-CM | POA: Diagnosis not present

## 2018-04-03 DIAGNOSIS — E039 Hypothyroidism, unspecified: Secondary | ICD-10-CM | POA: Diagnosis not present

## 2018-04-03 DIAGNOSIS — G5602 Carpal tunnel syndrome, left upper limb: Secondary | ICD-10-CM | POA: Diagnosis not present

## 2018-04-03 DIAGNOSIS — H269 Unspecified cataract: Secondary | ICD-10-CM | POA: Diagnosis not present

## 2018-04-03 DIAGNOSIS — N281 Cyst of kidney, acquired: Secondary | ICD-10-CM | POA: Diagnosis not present

## 2018-04-03 DIAGNOSIS — I10 Essential (primary) hypertension: Secondary | ICD-10-CM | POA: Diagnosis not present

## 2018-04-03 DIAGNOSIS — E782 Mixed hyperlipidemia: Secondary | ICD-10-CM | POA: Diagnosis not present

## 2018-04-03 DIAGNOSIS — M48061 Spinal stenosis, lumbar region without neurogenic claudication: Secondary | ICD-10-CM | POA: Diagnosis not present

## 2018-04-03 DIAGNOSIS — I480 Paroxysmal atrial fibrillation: Secondary | ICD-10-CM | POA: Diagnosis not present

## 2018-05-15 DIAGNOSIS — L218 Other seborrheic dermatitis: Secondary | ICD-10-CM | POA: Diagnosis not present

## 2018-08-28 DIAGNOSIS — Z471 Aftercare following joint replacement surgery: Secondary | ICD-10-CM | POA: Diagnosis not present

## 2018-08-28 DIAGNOSIS — Z96652 Presence of left artificial knee joint: Secondary | ICD-10-CM | POA: Diagnosis not present

## 2018-08-29 ENCOUNTER — Other Ambulatory Visit: Payer: Self-pay | Admitting: Cardiovascular Disease

## 2018-08-29 NOTE — Telephone Encounter (Signed)
85 yo Male 93.9kg Scr = 1.07 on 09/2027 ([facility])

## 2018-10-16 DIAGNOSIS — H2513 Age-related nuclear cataract, bilateral: Secondary | ICD-10-CM | POA: Diagnosis not present

## 2018-11-25 DIAGNOSIS — E782 Mixed hyperlipidemia: Secondary | ICD-10-CM | POA: Diagnosis not present

## 2018-11-25 DIAGNOSIS — G5602 Carpal tunnel syndrome, left upper limb: Secondary | ICD-10-CM | POA: Diagnosis not present

## 2018-11-25 DIAGNOSIS — M170 Bilateral primary osteoarthritis of knee: Secondary | ICD-10-CM | POA: Diagnosis not present

## 2018-11-25 DIAGNOSIS — H269 Unspecified cataract: Secondary | ICD-10-CM | POA: Diagnosis not present

## 2018-11-25 DIAGNOSIS — I480 Paroxysmal atrial fibrillation: Secondary | ICD-10-CM | POA: Diagnosis not present

## 2018-11-25 DIAGNOSIS — N400 Enlarged prostate without lower urinary tract symptoms: Secondary | ICD-10-CM | POA: Diagnosis not present

## 2018-11-25 DIAGNOSIS — I10 Essential (primary) hypertension: Secondary | ICD-10-CM | POA: Diagnosis not present

## 2018-11-25 DIAGNOSIS — E039 Hypothyroidism, unspecified: Secondary | ICD-10-CM | POA: Diagnosis not present

## 2018-11-25 DIAGNOSIS — Z1389 Encounter for screening for other disorder: Secondary | ICD-10-CM | POA: Diagnosis not present

## 2018-11-25 DIAGNOSIS — N281 Cyst of kidney, acquired: Secondary | ICD-10-CM | POA: Diagnosis not present

## 2018-11-25 DIAGNOSIS — Z0001 Encounter for general adult medical examination with abnormal findings: Secondary | ICD-10-CM | POA: Diagnosis not present

## 2018-11-25 DIAGNOSIS — M48061 Spinal stenosis, lumbar region without neurogenic claudication: Secondary | ICD-10-CM | POA: Diagnosis not present

## 2018-12-05 ENCOUNTER — Other Ambulatory Visit: Payer: Self-pay | Admitting: Cardiovascular Disease

## 2018-12-05 NOTE — Telephone Encounter (Signed)
12m 93.9kg Scr 1.26 11/25/18 Lovw/croitoru 12/17/17

## 2018-12-19 ENCOUNTER — Telehealth: Payer: Self-pay | Admitting: *Deleted

## 2018-12-19 NOTE — Telephone Encounter (Signed)
Call placed to the patient concerning his appointment tomorrow with Dr. Sallyanne Kuster. The patient has been rescheduled because he would prefer to be seen in the office instead of a virtual appointment. He has been rescheduled.

## 2018-12-20 ENCOUNTER — Telehealth: Payer: Medicare Other | Admitting: Cardiovascular Disease

## 2018-12-25 ENCOUNTER — Encounter: Payer: Self-pay | Admitting: Cardiovascular Disease

## 2018-12-25 ENCOUNTER — Ambulatory Visit (INDEPENDENT_AMBULATORY_CARE_PROVIDER_SITE_OTHER): Payer: Medicare Other | Admitting: Cardiovascular Disease

## 2018-12-25 ENCOUNTER — Other Ambulatory Visit: Payer: Self-pay

## 2018-12-25 VITALS — BP 140/92 | HR 72 | Temp 97.0°F | Ht 72.0 in | Wt 211.4 lb

## 2018-12-25 DIAGNOSIS — Z7901 Long term (current) use of anticoagulants: Secondary | ICD-10-CM | POA: Diagnosis not present

## 2018-12-25 DIAGNOSIS — I480 Paroxysmal atrial fibrillation: Secondary | ICD-10-CM

## 2018-12-25 DIAGNOSIS — I77810 Thoracic aortic ectasia: Secondary | ICD-10-CM | POA: Diagnosis not present

## 2018-12-25 NOTE — Patient Instructions (Signed)

## 2018-12-25 NOTE — Progress Notes (Signed)
Cardiology Office Note:    Date:  12/25/2018   ID:  MACKENZY CERASUOLO, DOB January 23, 1930, MRN [id number]  PCP:  Josetta Huddle, MD  Cardiologist:  Sanda Klein, MD    Referring MD: Josetta Huddle, MD   No chief complaint on file. Atrial fibrillation  History of Present Illness:    Tom Norton is a 85 y.o. male with a hx of Infrequent episodes of paroxysmal atrial fibrillation, for which he required cardioversion in 2015. He does not have diabetes mellitus, hypertension, heart failure, valvular heart disease and the only abnormalities on his echocardiogram are biatrial dilatation and mild aortic root enlargement. There was no evidence of coronary insufficiency by nuclear stress test performed at the time of his cardioversion.  He has been poorly tolerant of beta blockers (hair loss, fatigue) and verapamil (fatigue). He is no longer taking any rate control medications. He is on anticoagulation with Eliquis and has never had an embolic event, TIA or stroke.   The patient specifically denies any chest pain at rest exertion, dyspnea at rest or with exertion, orthopnea, paroxysmal nocturnal dyspnea, syncope, palpitations, focal neurological deficits, intermittent claudication, lower extremity edema, unexplained weight gain, cough, hemoptysis or wheezing.  Back pain is still an issue, but less so than last year.   Past Medical History:  Diagnosis Date  . Acute renal insufficiency    a. 09/2013: dehydration.  . Anemia    history childhood  . Ankle fracture   . Cardiomegaly   . Dilated aortic root ([place])    a. 09/2013: 4.2cm by echo.  . Enlarged prostate without lower urinary tract symptoms (luts)   . GERD (gastroesophageal reflux disease)    rare  . High cholesterol   . History of hiatal hernia   . Hyperlipidemia   . Hypertension   . Hypotension    a. 09/2013 adm felt due to dehydration.  . Hypothyroidism   . Insomnia   . Junctional bradycardia   . Left arm numbness    Will see Dr.  Jannifer Franklin July 17, 2017  . OA (osteoarthritis)   . Obesity   . Paroxysmal A-fib ([place])    a. 09/23/13 a-fib with RVR and hypotensive, requiring emergent cardioversion in ED  . Peripheral neuropathy 07/17/2017  . Sinus bradycardia   . Tick bite   . Vitamin D deficiency     Past Surgical History:  Procedure Laterality Date  . CARDIOVERSION  2015  . HERNIA REPAIR    . TOTAL KNEE ARTHROPLASTY Left 07/20/2017   Procedure: LEFT TOTAL KNEE ARTHROPLASTY;  Surgeon: Sydnee Cabal, MD;  Location: [facility];  Service: Orthopedics;  Laterality: Left;  Adductor Block  . TRANSTHORACIC ECHOCARDIOGRAM  09/23/2013   EF 55-60%; normal wall motion. Moderately dilated aortic root and ascending aorta; mild AI; moderate bi-atrial enlargement    Current Medications: Current Meds  Medication Sig  . Cholecalciferol (VITAMIN D3) 2000 units TABS Take 2,000 Units by mouth daily.  Marland Kitchen CINNAMON PO Take 1,000 mg by mouth daily.   . Coenzyme Q10 (CO Q10) 100 MG CAPS Take 100 mg by mouth daily.  . diphenhydrAMINE (BENADRYL) 25 mg capsule Take 50 mg by mouth daily as needed for allergies.  Marland Kitchen ELIQUIS 5 MG TABS tablet TAKE 1 TABLET BY MOUTH 2 TIMES DAILY.  Marland Kitchen EPINEPHrine 0.3 mg/0.3 mL IJ SOAJ injection Inject 0.3 mg into the muscle daily as needed (allergic reaction).  . eszopiclone (LUNESTA) 1 MG TABS tablet Take 1-2 mg by mouth at bedtime. Take immediately before  bedtime  . levothyroxine (SYNTHROID, LEVOTHROID) 25 MCG tablet Take 25 mcg by mouth daily before breakfast.  . MELATONIN ER PO Take by mouth.  . Multiple Vitamin (MULTIVITAMIN WITH MINERALS) TABS Take 1 tablet by mouth daily.  . Tamsulosin HCl (FLOMAX) 0.4 MG CAPS Take 0.4 mg by mouth daily after supper.  Marland Kitchen UNABLE TO FIND Take 1 tablet by mouth every evening. Med Name: Protandim     Allergies:   Doxycycline hyclate and Other   Social History   Socioeconomic History  . Marital status: Married    Spouse name: Not on file  . Number of children: Not on file  .  Years of education: Not on file  . Highest education level: Not on file  Occupational History  . Not on file  Social Needs  . Financial resource strain: Not on file  . Food insecurity    Worry: Not on file    Inability: Not on file  . Transportation needs    Medical: Not on file    Non-medical: Not on file  Tobacco Use  . Smoking status: Never Smoker  . Smokeless tobacco: Never Used  Substance and Sexual Activity  . Alcohol use: Yes    Alcohol/week: 10.0 standard drinks    Types: 10 Glasses of wine per week  . Drug use: No  . Sexual activity: Not on file  Lifestyle  . Physical activity    Days per week: Not on file    Minutes per session: Not on file  . Stress: Not on file  Relationships  . Social Herbalist on phone: Not on file    Gets together: Not on file    Attends religious service: Not on file    Active member of club or organization: Not on file    Attends meetings of clubs or organizations: Not on file    Relationship status: Not on file  Other Topics Concern  . Not on file  Social History Narrative   Married. Wife is a patient of Dr. Radford Pax.   At least one son.   Never smoked. Up to 10 glasses of wine a week     Family History: The patient's family history includes Heart disease in his brother and father; Stroke in his brother. ROS:   Please see the history of present illness.    All other systems are reviewed and are negative  EKGs/Labs/Other Studies Reviewed:    EKG:  EKG is ordered today.  Sinus rhythm, incomplete right bundle branch block, no repolarization abnormalities.  Unchanged from last year  Recent Labs: 11/25/2018 Hemoglobin 15.2, creatinine 1.26, potassium 3.9, normal liver function tests, normal TSH Recent Lipid Panel 11/25/2018 cholesterol 216, HDL 52, LDL 132, triglycerides 105  Physical Exam:    VS:  BP (!) 140/92   Pulse 72   Temp (!) 97 F (36.1 C)   Ht 6' (1.829 m)   Wt 211 lb 6.4 oz (95.9 kg)   SpO2 94%   BMI  28.67 kg/m     Wt Readings from Last 3 Encounters:  12/25/18 211 lb 6.4 oz (95.9 kg)  12/17/17 207 lb (93.9 kg)  07/20/17 218 lb (98.9 kg)     General: Alert, oriented x3, no distress,  Head: no evidence of trauma, PERRL, EOMI, no exophtalmos or lid lag, no myxedema, no xanthelasma; normal ears, nose and oropharynx Neck: normal jugular venous pulsations and no hepatojugular reflux; brisk carotid pulses without delay and no carotid bruits Chest:  clear to auscultation, no signs of consolidation by percussion or palpation, normal fremitus, symmetrical and full respiratory excursions Cardiovascular: normal position and quality of the apical impulse, regular rhythm, normal first and widely split second heart sounds, no murmurs, rubs or gallops Abdomen: no tenderness or distention, no masses by palpation, no abnormal pulsatility or arterial bruits, normal bowel sounds, no hepatosplenomegaly Extremities: no clubbing, cyanosis or edema; 2+ radial, ulnar and brachial pulses bilaterally; 2+ right femoral, posterior tibial and dorsalis pedis pulses; 2+ left femoral, posterior tibial and dorsalis pedis pulses; no subclavian or femoral bruits Neurological: grossly nonfocal Psych: Normal mood and affect   ASSESSMENT:    1. Paroxysmal atrial fibrillation (HCC)   2. Long term current use of anticoagulant therapy   3. Dilated aortic root (HCC)    PLAN:    In order of problems listed above:  1. AFib: No clinically apparent recurrence in years.  CHADSVasc 2 (age only). Compliant with anticoagulation.  2. Eliquis: No bleeding complications. 3. Asc Ao dilation: Mild (max 44 mm), as long as asymptomatic, no plan for routine monitoring at age 48.   Medication Adjustments/Labs and Tests Ordered: Current medicines are reviewed at length with the patient today.  Concerns regarding medicines are outlined above.  Orders Placed This Encounter  Procedures  . EKG 12-Lead   No orders of the defined types  were placed in this encounter.   Signed, Sanda Klein, MD  12/25/2018 1:25 PM    [facility]

## 2019-01-29 DIAGNOSIS — Z23 Encounter for immunization: Secondary | ICD-10-CM | POA: Diagnosis not present

## 2019-04-23 DIAGNOSIS — E785 Hyperlipidemia, unspecified: Secondary | ICD-10-CM | POA: Diagnosis not present

## 2019-04-23 DIAGNOSIS — I4891 Unspecified atrial fibrillation: Secondary | ICD-10-CM | POA: Diagnosis not present

## 2019-04-23 DIAGNOSIS — M179 Osteoarthritis of knee, unspecified: Secondary | ICD-10-CM | POA: Diagnosis not present

## 2019-04-23 DIAGNOSIS — I10 Essential (primary) hypertension: Secondary | ICD-10-CM | POA: Diagnosis not present

## 2019-04-23 DIAGNOSIS — E782 Mixed hyperlipidemia: Secondary | ICD-10-CM | POA: Diagnosis not present

## 2019-04-23 DIAGNOSIS — M170 Bilateral primary osteoarthritis of knee: Secondary | ICD-10-CM | POA: Diagnosis not present

## 2019-04-23 DIAGNOSIS — H269 Unspecified cataract: Secondary | ICD-10-CM | POA: Diagnosis not present

## 2019-04-23 DIAGNOSIS — I480 Paroxysmal atrial fibrillation: Secondary | ICD-10-CM | POA: Diagnosis not present

## 2019-04-23 DIAGNOSIS — E039 Hypothyroidism, unspecified: Secondary | ICD-10-CM | POA: Diagnosis not present

## 2019-04-23 DIAGNOSIS — N400 Enlarged prostate without lower urinary tract symptoms: Secondary | ICD-10-CM | POA: Diagnosis not present

## 2019-05-01 DIAGNOSIS — R609 Edema, unspecified: Secondary | ICD-10-CM | POA: Diagnosis not present

## 2019-05-01 DIAGNOSIS — R1111 Vomiting without nausea: Secondary | ICD-10-CM | POA: Diagnosis not present

## 2019-05-01 DIAGNOSIS — R1084 Generalized abdominal pain: Secondary | ICD-10-CM | POA: Diagnosis not present

## 2019-05-01 DIAGNOSIS — R52 Pain, unspecified: Secondary | ICD-10-CM | POA: Diagnosis not present

## 2019-05-01 DIAGNOSIS — R5381 Other malaise: Secondary | ICD-10-CM | POA: Diagnosis not present

## 2019-05-02 ENCOUNTER — Encounter (HOSPITAL_COMMUNITY): Payer: Self-pay

## 2019-05-02 ENCOUNTER — Inpatient Hospital Stay (HOSPITAL_COMMUNITY): Payer: Medicare Other

## 2019-05-02 ENCOUNTER — Emergency Department (HOSPITAL_COMMUNITY): Payer: Medicare Other

## 2019-05-02 ENCOUNTER — Inpatient Hospital Stay (HOSPITAL_COMMUNITY)
Admission: EM | Admit: 2019-05-02 | Discharge: 2019-05-04 | DRG: 358 | Disposition: A | Discharge status: Home / Self Care | Payer: Medicare Other | Attending: Internal Medicine | Admitting: Internal Medicine

## 2019-05-02 ENCOUNTER — Other Ambulatory Visit: Payer: Self-pay

## 2019-05-02 DIAGNOSIS — M1990 Unspecified osteoarthritis, unspecified site: Secondary | ICD-10-CM | POA: Diagnosis present

## 2019-05-02 DIAGNOSIS — Z79899 Other long term (current) drug therapy: Secondary | ICD-10-CM | POA: Diagnosis not present

## 2019-05-02 DIAGNOSIS — K219 Gastro-esophageal reflux disease without esophagitis: Secondary | ICD-10-CM | POA: Diagnosis present

## 2019-05-02 DIAGNOSIS — D689 Coagulation defect, unspecified: Secondary | ICD-10-CM

## 2019-05-02 DIAGNOSIS — E039 Hypothyroidism, unspecified: Secondary | ICD-10-CM | POA: Diagnosis present

## 2019-05-02 DIAGNOSIS — Z888 Allergy status to other drugs, medicaments and biological substances status: Secondary | ICD-10-CM | POA: Diagnosis not present

## 2019-05-02 DIAGNOSIS — K4031 Unilateral inguinal hernia, with obstruction, without gangrene, recurrent: Principal | ICD-10-CM

## 2019-05-02 DIAGNOSIS — K4091 Unilateral inguinal hernia, without obstruction or gangrene, recurrent: Secondary | ICD-10-CM | POA: Diagnosis not present

## 2019-05-02 DIAGNOSIS — Z823 Family history of stroke: Secondary | ICD-10-CM | POA: Diagnosis not present

## 2019-05-02 DIAGNOSIS — Z03818 Encounter for observation for suspected exposure to other biological agents ruled out: Secondary | ICD-10-CM | POA: Diagnosis not present

## 2019-05-02 DIAGNOSIS — N200 Calculus of kidney: Secondary | ICD-10-CM | POA: Diagnosis not present

## 2019-05-02 DIAGNOSIS — N400 Enlarged prostate without lower urinary tract symptoms: Secondary | ICD-10-CM | POA: Diagnosis present

## 2019-05-02 DIAGNOSIS — G629 Polyneuropathy, unspecified: Secondary | ICD-10-CM | POA: Diagnosis present

## 2019-05-02 DIAGNOSIS — E860 Dehydration: Secondary | ICD-10-CM | POA: Diagnosis present

## 2019-05-02 DIAGNOSIS — Z96652 Presence of left artificial knee joint: Secondary | ICD-10-CM | POA: Diagnosis present

## 2019-05-02 DIAGNOSIS — K432 Incisional hernia without obstruction or gangrene: Secondary | ICD-10-CM | POA: Diagnosis not present

## 2019-05-02 DIAGNOSIS — I480 Paroxysmal atrial fibrillation: Secondary | ICD-10-CM | POA: Diagnosis present

## 2019-05-02 DIAGNOSIS — K56609 Unspecified intestinal obstruction, unspecified as to partial versus complete obstruction: Secondary | ICD-10-CM

## 2019-05-02 DIAGNOSIS — Z7901 Long term (current) use of anticoagulants: Secondary | ICD-10-CM | POA: Diagnosis not present

## 2019-05-02 DIAGNOSIS — Z8249 Family history of ischemic heart disease and other diseases of the circulatory system: Secondary | ICD-10-CM

## 2019-05-02 DIAGNOSIS — E1165 Type 2 diabetes mellitus with hyperglycemia: Secondary | ICD-10-CM | POA: Diagnosis present

## 2019-05-02 DIAGNOSIS — I10 Essential (primary) hypertension: Secondary | ICD-10-CM | POA: Diagnosis present

## 2019-05-02 DIAGNOSIS — E559 Vitamin D deficiency, unspecified: Secondary | ICD-10-CM | POA: Diagnosis present

## 2019-05-02 DIAGNOSIS — Z79890 Hormone replacement therapy (postmenopausal): Secondary | ICD-10-CM | POA: Diagnosis not present

## 2019-05-02 DIAGNOSIS — R739 Hyperglycemia, unspecified: Secondary | ICD-10-CM | POA: Diagnosis present

## 2019-05-02 DIAGNOSIS — Z20822 Contact with and (suspected) exposure to covid-19: Secondary | ICD-10-CM | POA: Diagnosis present

## 2019-05-02 DIAGNOSIS — E785 Hyperlipidemia, unspecified: Secondary | ICD-10-CM | POA: Diagnosis present

## 2019-05-02 LAB — COMPREHENSIVE METABOLIC PANEL
ALT: 18 U/L (ref 0–44)
AST: 20 U/L (ref 15–41)
Albumin: 4.5 g/dL (ref 3.5–5.0)
Alkaline Phosphatase: 78 U/L (ref 38–126)
Anion gap: 15 (ref 5–15)
BUN: 13 mg/dL (ref 8–23)
CO2: 22 mmol/L (ref 22–32)
Calcium: 10.4 mg/dL — ABNORMAL HIGH (ref 8.9–10.3)
Chloride: 105 mmol/L (ref 98–111)
Creatinine, Ser: 1.06 mg/dL (ref 0.61–1.24)
GFR calc Af Amer: >60 mL/min (ref >60)
GFR calc non Af Amer: >60 mL/min (ref >60)
Glucose, Bld: 177 mg/dL — ABNORMAL HIGH (ref 70–99)
Potassium: 4 mmol/L (ref 3.5–5.1)
Sodium: 142 mmol/L (ref 135–145)
Total Bilirubin: 1 mg/dL (ref 0.3–1.2)
Total Protein: 7.5 g/dL (ref 6.5–8.1)

## 2019-05-02 LAB — URINALYSIS, ROUTINE W REFLEX MICROSCOPIC
Bilirubin Urine: NEGATIVE
Glucose, UA: NEGATIVE mg/dL
Hgb urine dipstick: NEGATIVE
Ketones, ur: 20 mg/dL — AB
Leukocytes,Ua: NEGATIVE
Nitrite: NEGATIVE
Protein, ur: 30 mg/dL — AB
Specific Gravity, Urine: 1.02 (ref 1.005–1.030)
pH: 5 (ref 5.0–8.0)

## 2019-05-02 LAB — CBC
HCT: 51.6 % (ref 39.0–52.0)
Hemoglobin: 17.2 g/dL — ABNORMAL HIGH (ref 13.0–17.0)
MCH: 31.6 pg (ref 26.0–34.0)
MCHC: 33.3 g/dL (ref 30.0–36.0)
MCV: 94.7 fL (ref 80.0–100.0)
Platelets: 267 10*3/uL (ref 150–400)
RBC: 5.45 MIL/uL (ref 4.22–5.81)
RDW: 13 % (ref 11.5–15.5)
WBC: 15.9 10*3/uL — ABNORMAL HIGH (ref 4.0–10.5)
nRBC: 0 % (ref 0.0–0.2)

## 2019-05-02 LAB — TSH: TSH: 2.642 u[IU]/mL (ref 0.350–4.500)

## 2019-05-02 LAB — LACTIC ACID, PLASMA
Lactic Acid, Venous: 2.2 mmol/L (ref 0.5–1.9)
Lactic Acid, Venous: 1.8 mmol/L (ref 0.5–1.9)
Lactic Acid, Venous: 2.7 mmol/L (ref 0.5–1.9)
Lactic Acid, Venous: 2.1 mmol/L (ref 0.5–1.9)

## 2019-05-02 LAB — RESPIRATORY PANEL BY RT PCR (FLU A&B, COVID)
Influenza A by PCR: NEGATIVE
Influenza B by PCR: NEGATIVE
SARS Coronavirus 2 by RT PCR: NEGATIVE

## 2019-05-02 LAB — GLUCOSE, CAPILLARY
Glucose-Capillary: 132 mg/dL — ABNORMAL HIGH (ref 70–99)
Glucose-Capillary: 125 mg/dL — ABNORMAL HIGH (ref 70–99)
Glucose-Capillary: 108 mg/dL — ABNORMAL HIGH (ref 70–99)

## 2019-05-02 LAB — LIPASE, BLOOD: Lipase: 24 U/L (ref 11–51)

## 2019-05-02 MED ORDER — DIATRIZOATE MEGLUMINE & SODIUM 66-10 % PO SOLN
90.0000 mL | Freq: Once | ORAL | Status: AC
  Administered 2019-05-02: 11:00:00 90 mL via ORAL
  Filled 2019-05-02: qty 90

## 2019-05-02 MED ORDER — ONDANSETRON HCL 4 MG/2ML IJ SOLN
4.0000 mg | Freq: Four times a day (QID) | INTRAMUSCULAR | Status: DC | PRN
  Administered 2019-05-03: 4 mg via INTRAVENOUS

## 2019-05-02 MED ORDER — LACTATED RINGERS IV SOLN: INTRAVENOUS | Status: DC

## 2019-05-02 MED ORDER — MORPHINE SULFATE (PF) 2 MG/ML IV SOLN: 2.0000 mg | INTRAVENOUS | Status: DC | PRN

## 2019-05-02 MED ORDER — LEVOTHYROXINE SODIUM 25 MCG PO TABS
25.0000 ug | ORAL_TABLET | Freq: Every day | ORAL | Status: DC
  Administered 2019-05-04: 06:00:00 25 ug via ORAL
  Filled 2019-05-02: qty 1

## 2019-05-02 MED ORDER — ACETAMINOPHEN 325 MG PO TABS: 650.0000 mg | ORAL_TABLET | Freq: Four times a day (QID) | ORAL | Status: DC | PRN

## 2019-05-02 MED ORDER — TAMSULOSIN HCL 0.4 MG PO CAPS
0.4000 mg | ORAL_CAPSULE | Freq: Every day | ORAL | Status: DC
  Administered 2019-05-02 – 2019-05-03 (×2): 0.4 mg via ORAL
  Filled 2019-05-02 (×2): qty 1

## 2019-05-02 MED ORDER — SODIUM CHLORIDE (PF) 0.9 % IJ SOLN
INTRAMUSCULAR | Status: AC
  Filled 2019-05-02: qty 50

## 2019-05-02 MED ORDER — LACTATED RINGERS IV BOLUS
500.0000 mL | Freq: Once | INTRAVENOUS | Status: AC
  Administered 2019-05-02: 500 mL via INTRAVENOUS

## 2019-05-02 MED ORDER — ONDANSETRON HCL 4 MG PO TABS: 4.0000 mg | ORAL_TABLET | Freq: Four times a day (QID) | ORAL | Status: DC | PRN

## 2019-05-02 MED ORDER — ACETAMINOPHEN 650 MG RE SUPP: 650.0000 mg | Freq: Four times a day (QID) | RECTAL | Status: DC | PRN

## 2019-05-02 MED ORDER — IOHEXOL 300 MG/ML  SOLN
100.0000 mL | Freq: Once | INTRAMUSCULAR | Status: AC | PRN
  Administered 2019-05-02: 05:00:00 100 mL via INTRAVENOUS

## 2019-05-02 MED ORDER — SODIUM CHLORIDE 0.9% FLUSH: 3.0000 mL | Freq: Once | INTRAVENOUS | Status: DC

## 2019-05-02 MED ORDER — DOCUSATE SODIUM 100 MG PO CAPS
100.0000 mg | ORAL_CAPSULE | Freq: Two times a day (BID) | ORAL | Status: DC
  Administered 2019-05-02: 11:00:00 100 mg via ORAL
  Filled 2019-05-02 (×3): qty 1

## 2019-05-02 MED ORDER — ZOLPIDEM TARTRATE 5 MG PO TABS
5.0000 mg | ORAL_TABLET | Freq: Every evening | ORAL | Status: DC | PRN
  Administered 2019-05-02 – 2019-05-03 (×2): 5 mg via ORAL
  Filled 2019-05-02 (×2): qty 1

## 2019-05-02 MED ORDER — ONDANSETRON HCL 4 MG/2ML IJ SOLN
4.0000 mg | Freq: Once | INTRAMUSCULAR | Status: AC
  Administered 2019-05-02: 07:00:00 4 mg via INTRAVENOUS
  Filled 2019-05-02: qty 2

## 2019-05-02 MED ORDER — INSULIN ASPART 100 UNIT/ML ~~LOC~~ SOLN
0.0000 [IU] | Freq: Three times a day (TID) | SUBCUTANEOUS | Status: DC
  Administered 2019-05-02 (×2): 1 [IU] via SUBCUTANEOUS
  Administered 2019-05-03: 17:00:00 2 [IU] via SUBCUTANEOUS

## 2019-05-02 NOTE — ED Notes (Signed)
Patient transported to CT 

## 2019-05-02 NOTE — H&P (Signed)
History and Physical    Tom Norton [id number] DOB: Jan 14, 1930 DOA: 05/02/2019  PCP: Josetta Huddle, MD Consultants:  Croitoru - cardiology; Collins/Gramig - orthopedics Patient coming from:  Home - lives with wife; Donald Prose: Wife, [phone]; [phone] (preferred)   Chief Complaint: Abdominal pain  HPI: Tom Norton is a 86 y.o. male with medical history significant of afib on Eliquis; hypothyroidism; HTN; and HLD presenting with abdominal pain.  He reports that he had a remote hernia repair.  He picked up some heavy rock bags from [place] for his driveway and thinks that brought on the problem a few days later, occurring several weeks ago.  He noticed significant pain in his left groin with swelling.   +N/V starting yesterday, worst in the evening.  No fever.  He takes Eliquis and his last dose was yesterday AM (1/7 AM).   ED Course:  Carryover, per Dr. Hal Hope:  86 year old male with history of A. fib on Eliquis presents with abdominal pain has obstructed the hernia with bowel obstruction for which general surgery has been consulted patient is receiving reversal agent for his apixaban. Patient is likely going for surgery today. Covid test is pending.  Review of Systems: As per HPI; otherwise review of systems reviewed and negative.   Ambulatory Status:  Ambulates without assistance  Past Medical History:  Diagnosis Date  . Cardiomegaly   . Dilated aortic root ([place])    a. 09/2013: 4.2cm by echo.  . Enlarged prostate without lower urinary tract symptoms (luts)   . GERD (gastroesophageal reflux disease)    rare  . History of hiatal hernia   . Hyperlipidemia   . Hypertension   . Hypothyroidism   . Insomnia   . Junctional bradycardia   . OA (osteoarthritis)   . Obesity   . Paroxysmal A-fib ([place])    a. 09/23/13 a-fib with RVR and hypotensive, requiring emergent cardioversion in ED  . Peripheral neuropathy 07/17/2017  . Vitamin D deficiency     Past Surgical History:   Procedure Laterality Date  . CARDIOVERSION  2015  . HERNIA REPAIR    . TOTAL KNEE ARTHROPLASTY Left 07/20/2017   Procedure: LEFT TOTAL KNEE ARTHROPLASTY;  Surgeon: Sydnee Cabal, MD;  Location: [facility];  Service: Orthopedics;  Laterality: Left;  Adductor Block  . TRANSTHORACIC ECHOCARDIOGRAM  09/23/2013   EF 55-60%; normal wall motion. Moderately dilated aortic root and ascending aorta; mild AI; moderate bi-atrial enlargement    Social History   Socioeconomic History  . Marital status: Married    Spouse name: Not on file  . Number of children: Not on file  . Years of education: Not on file  . Highest education level: Not on file  Occupational History  . Occupation: retired  Tobacco Use  . Smoking status: Never Smoker  . Smokeless tobacco: Never Used  Substance and Sexual Activity  . Alcohol use: Yes    Alcohol/week: 10.0 standard drinks    Types: 10 Glasses of wine per week    Comment: couple of glasses of wine a day most days  . Drug use: No  . Sexual activity: Not on file  Other Topics Concern  . Not on file  Social History Narrative   Married. Wife is a patient of Dr. Radford Pax.   At least one son.   Never smoked. Up to 10 glasses of wine a week   Social Determinants of Health   Financial Resource Strain:   . Difficulty of Paying Living Expenses: Not on  file  Food Insecurity:   . Worried About Charity fundraiser in the Last Year: Not on file  . Ran Out of Food in the Last Year: Not on file  Transportation Needs:   . Lack of Transportation (Medical): Not on file  . Lack of Transportation (Non-Medical): Not on file  Physical Activity:   . Days of Exercise per Week: Not on file  . Minutes of Exercise per Session: Not on file  Stress:   . Feeling of Stress : Not on file  Social Connections:   . Frequency of Communication with Friends and Family: Not on file  . Frequency of Social Gatherings with Friends and Family: Not on file  . Attends Religious Services: Not on  file  . Active Member of Clubs or Organizations: Not on file  . Attends Archivist Meetings: Not on file  . Marital Status: Not on file  Intimate Partner Violence:   . Fear of Current or Ex-Partner: Not on file  . Emotionally Abused: Not on file  . Physically Abused: Not on file  . Sexually Abused: Not on file    Allergies  Allergen Reactions  . Doxycycline Hyclate Anaphylaxis  . Other Anaphylaxis    Bee stings    Family History  Problem Relation Age of Onset  . Heart disease Father   . Stroke Brother   . Heart disease Brother     Prior to Admission medications   Medication Sig Start Date End Date Taking? Authorizing Provider  Cholecalciferol (VITAMIN D3) 2000 units TABS Take 2,000 Units by mouth daily.   Yes [provider]  CINNAMON PO Take 1,000 mg by mouth daily.    Yes [provider]  Coenzyme Q10 (CO Q10) 100 MG CAPS Take 100 mg by mouth daily.   Yes [provider]  diphenhydrAMINE (BENADRYL) 25 mg capsule Take 50 mg by mouth daily as needed for allergies.   Yes [provider]  ELIQUIS 5 MG TABS tablet TAKE 1 TABLET BY MOUTH 2 TIMES DAILY. 12/05/18  Yes Croitoru, Mihai, MD  EPINEPHrine 0.3 mg/0.3 mL IJ SOAJ injection Inject 0.3 mg into the muscle daily as needed (allergic reaction).   Yes [provider]  eszopiclone (LUNESTA) 1 MG TABS tablet Take 1-2 mg by mouth at bedtime. Take immediately before bedtime   Yes [provider]  levothyroxine (SYNTHROID, LEVOTHROID) 25 MCG tablet Take 25 mcg by mouth daily before breakfast.   Yes [provider]  Multiple Vitamin (MULTIVITAMIN WITH MINERALS) TABS Take 1 tablet by mouth daily.   Yes [provider]  Tamsulosin HCl (FLOMAX) 0.4 MG CAPS Take 0.4 mg by mouth daily after supper.   Yes [provider]  UNABLE TO FIND Take 1 tablet by mouth every evening. Med Name: Protandim   Yes [provider]  MELATONIN ER PO Take by mouth.     [provider]    Physical Exam: Vitals:   05/02/19 0654 05/02/19 0758 05/02/19 0900 05/02/19 0921  BP: (!) 142/83  137/83 (!) 151/100  Pulse: 90  86 (!) 102  Resp: 16  (!) 22 20  Temp:    98 F (36.7 C)  TempSrc:    Oral  SpO2: 93%  91% 96%  Weight:  94.3 kg    Height:  6' (1.829 m)       . General:  Appears calm and comfortable and is NAD . Eyes:  PERRL, EOMI, normal lids, iris . ENT:  grossly normal hearing, lips & tongue, mmm; appropriate dentition . Neck:  no LAD, masses or thyromegaly . Cardiovascular:  RRR, no m/r/g. No LE edema.  Marland Kitchen Respiratory:   CTA bilaterally with no wheezes/rales/rhonchi.  Normal respiratory effort. . Abdomen:  soft, NT, ND, NABS; R inguinal hernia without apparent strangulation at this time . Skin:  no rash or induration seen on limited exam . Musculoskeletal:  grossly normal tone BUE/BLE, good ROM, no bony abnormality . Psychiatric:  grossly normal mood and affect, speech fluent and appropriate, AOx3 Neurologic:  CN 2-12 grossly intact, moves all extremities in coordinated fashion    Radiological Exams on Admission: CT ABDOMEN PELVIS W CONTRAST  Result Date: 05/02/2019 CLINICAL DATA:  Bowel obstruction suspected. Left lower quadrant pain for 2 weeks EXAM: CT ABDOMEN AND PELVIS WITH CONTRAST TECHNIQUE: Multidetector CT imaging of the abdomen and pelvis was performed using the standard protocol following bolus administration of intravenous contrast. CONTRAST:  156mL OMNIPAQUE IOHEXOL 300 MG/ML  SOLN COMPARISON:  None. FINDINGS: Lower chest:  Coronary atherosclerosis.  Lung bases are clear. Hepatobiliary: No focal liver abnormality.No evidence of biliary obstruction or stone. Pancreas: Unremarkable. Spleen: Unremarkable. Adrenals/Urinary Tract: Negative adrenals. No hydronephrosis or ureteral stone. There are 2 left renal calculi measuring up to 1 cm at the lower pole. Left renal cortical and hilar cysts. Jackstone calculus in the bladder  measuring 2 cm. Mild bladder wall thickening and lobulation likely from small cellules. Stomach/Bowel: Dilated bowel above a bowel containing left inguinal hernia. No evident perforation or bowel necrosis. There is extensive left colonic diverticulosis. Vascular/Lymphatic: No acute vascular abnormality. Atherosclerotic calcification. No mass or adenopathy. Reproductive:Enlarged prostate. Other: Left inguinal hernia as noted above. Small fatty umbilical hernia. Trace pelvic fluid considered reactive. Musculoskeletal: Advanced spinal degeneration with lumbar levoscoliosis. No acute or aggressive finding IMPRESSION: 1. High-grade small bowel obstruction due to left inguinal hernia. 2. Left renal and bladder calculi. Electronically Signed   By: Monte Fantasia M.D.   On: 05/02/2019 05:13   DG Abd Portable 1V-Small Bowel Obstruction Protocol-initial, 8 hr delay  Result Date: 05/02/2019 CLINICAL DATA:  Progressive abdominal pain with nausea and vomiting EXAM: PORTABLE ABDOMEN - 1 VIEW COMPARISON:  CT abdomen and pelvis May 02, 2019 obtained earlier in the day FINDINGS: Multiple loops of bowel remain dilated with borderline thickening of multiple areas of bowel wall consistent with the bowel obstruction noted on recent CT. No free air evident. Contrast is seen in the collecting systems, ureters, and urinary bladder. IMPRESSION: Persistent bowel dilatation with borderline bowel wall thickening at multiple sites. Findings indicative of bowel obstruction. No free air appreciable on supine examination. Electronically Signed   By: Lowella Grip III M.D.   On: 05/02/2019 08:52    EKG: Independently reviewed.  NSR with rate 89; nonspecific ST changes with no evidence of acute ischemia   Labs on Admission: I have personally reviewed the available labs and imaging studies at the time of the admission.  Pertinent labs:   Glucose 177 Calcium 10.4 WBC 15.9 Hgb 17.2 Lactate 2.2 Respiratory panel PCR  negative UA: 20 ketones, 30 protein, rate bacteria  Assessment/Plan Principal Problem:   Intestinal obstruction without gangrene due to recurrent left inguinal hernia Active Problems:   Paroxysmal atrial fibrillation (HCC)   Hypothyroidism   Dehydration   Essential hypertension   Dyslipidemia   Hyperglycemia   SBO associated with recurrent L inguinal hernia -Patient with prior h/o remote hernia surgery presenting with subacute onset of abdominal pain around Christmas  and acute n/v, abdominal distention, and CT findings c/w high-grade SBO due to hernia -Will admit pending further evaluation and probably surgery in the next 1-2 days -Gen Surg consulted by ER; surgical intervention has been delayed at this time due to Eliquis -Surgery has ordered the SBO protocol with gastrograffin, but there does not appear to be a need for NG tube placement at this time -IVF hydration -Pain control with morphine  Dehydration -Patient was noted to have ketonuria on presentation -Will hydrate and monitor clinically -No evidence of AKI at the time of admission, which is reassuring -Lactate was mildly elevated at the time of presentation and has normalized; likely associated with dehydration rather than sepsis  Afib on Eliquis -He is rate controlled without medication -His last dose of Eliquis was on 1/7 AM and reversal has been requested through pharmacy for anticipated surgery tomorrow (1/9)  HTN -He does not appear to be taking medications for this issue at this time  HLD -He does not appear to be taking medications for this issue at this time  BPH -Continue Flomax  Hypothyroidism -Check TSH; it was elevated on last check in Epic in 2015 -Continue Synthroid at current dose for now  Hyperglycemia -Glucose was 177 which is significantly elevated and was 168 in 06/2017 -While at his age he is unlikely to suffer significant long-term effects associated with DM, he is likely to have improved  wound healing with good glycemic control -As such, will order sensitive-scale SSI -Will not order A1c despite his last one being in 2015 since long-term DM treatment is likely unnecessary at this time   Note: This patient has been tested and is negative for the novel coronavirus COVID-19.  DVT prophylaxis:  SCDs Code Status:  Full - confirmed with patient Family Communication: None present; I spoke with his wife by telephone  Disposition Plan:  Home once clinically improved Consults called: Surgery  Admission status: Admit - It is my clinical opinion that admission to INPATIENT is reasonable and necessary because of the expectation that this patient will require hospital care that crosses at least 2 midnights to treat this condition based on the medical complexity of the problems presented.  Given the aforementioned information, the predictability of an adverse outcome is felt to be significant.    Karmen Bongo MD [facility]   How to contact the [facility] Attending or Consulting provider [place] or covering provider during after hours [place], for this patient?  1. Check the care team in [facility] and look for a) attending/consulting [facility] provider listed and b) the [facility] team listed 2. Log into [email or web address] and use [place]'s universal password to access. If you do not have the password, please contact the hospital operator. 3. Locate the [facility] provider you are looking for under [facility] and page to a number that you can be directly reached. 4. If you still have difficulty reaching the provider, please page the [facility] (Director on Call) for the Hospitalists listed on amion for assistance.   05/02/2019, 10:41 AM

## 2019-05-02 NOTE — ED Notes (Signed)
CRITICAL VALUE STICKER  CRITICAL VALUE: Lactic Acid 2.2  DATE & TIME NOTIFIED: 05/02/19 [id number]  MD NOTIFIED:  Oley Balm  TIME OF NOTIFICATION: [id number]

## 2019-05-02 NOTE — Consult Note (Signed)
[facility] Surgery Consult Note  Tom Norton 12-Mar-1930  [id number].    Requesting MD: Reece Levy Chief Complaint:  Abdominal pain, nausea and vomiting Reason for Consult: SBO  HPI:  Patient is an 86 year old gentleman who had a hernia repair some years ago.  He says he has had intermittent pain on and off since Christmas day.  Pain is primarily in the left lower quadrant.  Pains become progressively worse and he developed nausea and vomiting for 2 hours prior to being transported to the ED at [facility].  Reports being constipated for the last 3 days.  He has used laxatives and suppositories without effect.  Work-up in the ED shows he is afebrile blood pressure slightly elevated pulse is primarily in the 90 range.  The telemetry looks like he is in sinus rhythm currently.  CMP shows a glucose of 177, calcium of 10.1 but is otherwise normal.  Lactate is 2.2.  WBC is 15.9, hemoglobin 17.2, hematocrit 51.6 and platelets are 267,000.  CT scan with IV contrast/no oral contrast: Showed a high-grade small bowel obstruction due to a left inguinal hernia.  There is left renal and bladder calculi.  There was extensive left colonic diverticulosis without diverticulitis.  We are asked to see.  He had his last Eliquis in the a.m. 05/01/2019.    ROS: Review of Systems  Constitutional: Negative.   HENT: Negative.   Eyes: Negative.   Respiratory: Negative.   Cardiovascular: Negative.   Gastrointestinal: Positive for abdominal pain, constipation, nausea and vomiting. Negative for blood in stool, diarrhea, heartburn and melena.  Genitourinary: Negative.   Musculoskeletal: Negative.   Skin: Negative.   Neurological: Negative.   Endo/Heme/Allergies: Bruises/bleeds easily (He is on Eliquis his last dose was in the a.m. of 05/01/2019).  Psychiatric/Behavioral: Negative.     Family History  Problem Relation Age of Onset  . Heart disease Father   . Stroke Brother   . Heart disease  Brother     Past Medical History:  Diagnosis Date  . Acute renal insufficiency    a. 09/2013: dehydration.  . Anemia    history childhood  . Ankle fracture   . Cardiomegaly   . Dilated aortic root ([place])    a. 09/2013: 4.2cm by echo.  . Enlarged prostate without lower urinary tract symptoms (luts)   . GERD (gastroesophageal reflux disease)    rare  . High cholesterol   . History of hiatal hernia   . Hyperlipidemia   . Hypertension   . Hypotension    a. 09/2013 adm felt due to dehydration.  . Hypothyroidism   . Insomnia   . Junctional bradycardia   . Left arm numbness    Will see Dr. Jannifer Franklin July 17, 2017  . OA (osteoarthritis)   . Obesity   . Paroxysmal A-fib ([place])    a. 09/23/13 a-fib with RVR and hypotensive, requiring emergent cardioversion in ED  . Peripheral neuropathy 07/17/2017  . Sinus bradycardia   . Tick bite   . Vitamin D deficiency     Past Surgical History:  Procedure Laterality Date  . CARDIOVERSION  2015  . HERNIA REPAIR    . TOTAL KNEE ARTHROPLASTY Left 07/20/2017   Procedure: LEFT TOTAL KNEE ARTHROPLASTY;  Surgeon: Sydnee Cabal, MD;  Location: [facility]S;  Service: Orthopedics;  Laterality: Left;  Adductor Block  . TRANSTHORACIC ECHOCARDIOGRAM  09/23/2013   EF 55-60%; normal wall motion. Moderately dilated aortic root and ascending aorta; mild AI; moderate bi-atrial enlargement  Social History:  reports that he has never smoked. He has never used smokeless tobacco. He reports current alcohol use of about 10.0 standard drinks of alcohol per week. He reports that he does not use drugs.  Allergies:  Allergies  Allergen Reactions  . Doxycycline Hyclate Anaphylaxis  . Other Anaphylaxis    Bee stings    Prior to Admission medications   Medication Sig Start Date End Date Taking? Authorizing Provider  Cholecalciferol (VITAMIN D3) 2000 units TABS Take 2,000 Units by mouth daily.   Yes [provider]  CINNAMON PO Take 1,000 mg by mouth daily.    Yes  [provider]  Coenzyme Q10 (CO Q10) 100 MG CAPS Take 100 mg by mouth daily.   Yes [provider]  diphenhydrAMINE (BENADRYL) 25 mg capsule Take 50 mg by mouth daily as needed for allergies.   Yes [provider]  ELIQUIS 5 MG TABS tablet TAKE 1 TABLET BY MOUTH 2 TIMES DAILY. 12/05/18  Yes Croitoru, Mihai, MD  EPINEPHrine 0.3 mg/0.3 mL IJ SOAJ injection Inject 0.3 mg into the muscle daily as needed (allergic reaction).   Yes [provider]  eszopiclone (LUNESTA) 1 MG TABS tablet Take 1-2 mg by mouth at bedtime. Take immediately before bedtime   Yes [provider]  levothyroxine (SYNTHROID, LEVOTHROID) 25 MCG tablet Take 25 mcg by mouth daily before breakfast.   Yes [provider]  Multiple Vitamin (MULTIVITAMIN WITH MINERALS) TABS Take 1 tablet by mouth daily.   Yes [provider]  Tamsulosin HCl (FLOMAX) 0.4 MG CAPS Take 0.4 mg by mouth daily after supper.   Yes [provider]  UNABLE TO FIND Take 1 tablet by mouth every evening. Med Name: Protandim   Yes [provider]  MELATONIN ER PO Take by mouth.    [provider]      Blood pressure (!) 142/83, pulse 90, temperature 98.2 F (36.8 C), temperature source Oral, resp. rate 16, SpO2 93 %. Physical Exam: Physical Exam Constitutional:      General: He is not in acute distress.    Appearance: Normal appearance. He is normal weight. He is not ill-appearing, toxic-appearing or diaphoretic.  HENT:     Head: Normocephalic and atraumatic.     Mouth/Throat:     Pharynx: Oropharynx is clear.  Eyes:     General: No scleral icterus.    Comments: Pupils are equal  Neck:     Vascular: No carotid bruit.  Cardiovascular:     Rate and Rhythm: Normal rate.     Pulses: Normal pulses.     Heart sounds: Normal heart sounds. No murmur.  Pulmonary:     Effort: Pulmonary effort is normal.     Breath sounds: Normal breath sounds.  Abdominal:      General: Abdomen is flat. Bowel sounds are normal.     Palpations: Abdomen is soft.     Comments: He has an incisional left lower quadrant from previous hernia repair.  This is the site of his abdominal pain.  Currently the hernia appears to be reduced.  No palpable bowel is felt on exam.  Musculoskeletal:     Cervical back: Normal range of motion and neck supple. No rigidity or tenderness.  Lymphadenopathy:     Cervical: No cervical adenopathy.  Skin:    General: Skin is warm and dry.     Capillary Refill: Capillary refill takes 2 to 3 seconds.  Neurological:     General: No  focal deficit present.     Mental Status: He is alert and oriented to person, place, and time.     Cranial Nerves: No cranial nerve deficit.  Psychiatric:        Mood and Affect: Mood normal.        Behavior: Behavior normal.        Thought Content: Thought content normal.        Judgment: Judgment normal.     Results for orders placed or performed during the hospital encounter of 05/02/19 (from the past 48 hour(s))  Lipase, blood     Status: None   Collection Time: 05/02/19  1:32 AM  Result Value Ref Range   Lipase 24 11 - 51 U/L    Comment: Performed at [facility], [place] [address] [place], [place] [address]  Comprehensive metabolic panel     Status: Abnormal   Collection Time: 05/02/19  1:32 AM  Result Value Ref Range   Sodium 142 135 - 145 mmol/L   Potassium 4.0 3.5 - 5.1 mmol/L   Chloride 105 98 - 111 mmol/L   CO2 22 22 - 32 mmol/L   Glucose, Bld 177 (H) 70 - 99 mg/dL   BUN 13 8 - 23 mg/dL   Creatinine, Ser 1.06 0.61 - 1.24 mg/dL   Calcium 10.4 (H) 8.9 - 10.3 mg/dL   Total Protein 7.5 6.5 - 8.1 g/dL   Albumin 4.5 3.5 - 5.0 g/dL   AST 20 15 - 41 U/L   ALT 18 0 - 44 U/L   Alkaline Phosphatase 78 38 - 126 U/L   Total Bilirubin 1.0 0.3 - 1.2 mg/dL   GFR calc non Af Amer >60 >60 mL/min   GFR calc Af Amer >60 >60 mL/min   Anion gap 15 5 - 15    Comment: Performed at [facility], [place] [address], [place], [place] [address]  CBC     Status: Abnormal   Collection Time: 05/02/19  1:32 AM  Result Value Ref Range   WBC 15.9 (H) 4.0 - 10.5 K/uL   RBC 5.45 4.22 - 5.81 MIL/uL   Hemoglobin 17.2 (H) 13.0 - 17.0 g/dL   HCT 51.6 39.0 - 52.0 %   MCV 94.7 80.0 - 100.0 fL   MCH 31.6 26.0 - 34.0 pg   MCHC 33.3 30.0 - 36.0 g/dL   RDW 13.0 11.5 - 15.5 %   Platelets 267 150 - 400 K/uL   nRBC 0.0 0.0 - 0.2 %    Comment: Performed at [facility], [place] [address] [place],  [address]  Urinalysis, Routine w reflex microscopic     Status: Abnormal   Collection Time: 05/02/19  4:28 AM  Result Value Ref Range   Color, Urine AMBER (A) YELLOW    Comment: BIOCHEMICALS MAY BE AFFECTED BY COLOR   APPearance HAZY (A) CLEAR   Specific Gravity, Urine 1.020 1.005 - 1.030   pH 5.0 5.0 - 8.0   Glucose, UA NEGATIVE NEGATIVE mg/dL   Hgb urine dipstick NEGATIVE NEGATIVE   Bilirubin Urine NEGATIVE NEGATIVE   Ketones, ur 20 (A) NEGATIVE mg/dL   Protein, ur 30 (A) NEGATIVE mg/dL   Nitrite NEGATIVE NEGATIVE   Leukocytes,Ua NEGATIVE NEGATIVE   RBC / HPF 6-10 0 - 5 RBC/hpf   WBC, UA 0-5 0 - 5 WBC/hpf   Bacteria, UA RARE (A) NONE SEEN   Squamous Epithelial / LPF 0-5 0 - 5   Mucus  PRESENT    Hyaline Casts, UA PRESENT     Comment: Performed at [facility], [place] [address], [place], [place] [address]  Lactic acid, plasma     Status: Abnormal   Collection Time: 05/02/19  5:46 AM  Result Value Ref Range   Lactic Acid, Venous 2.2 (HH) 0.5 - 1.9 mmol/L    Comment: CRITICAL RESULT CALLED TO, READ BACK BY AND VERIFIED WITHIvonne Andrew RN AT [phone] 05/02/19 MULLINS,T Performed at [facility], [place] [address], [place], [place] [address]    CT ABDOMEN PELVIS W CONTRAST  Result Date: 05/02/2019 CLINICAL DATA:  Bowel obstruction suspected. Left lower quadrant pain for 2 weeks EXAM: CT ABDOMEN AND PELVIS WITH CONTRAST  TECHNIQUE: Multidetector CT imaging of the abdomen and pelvis was performed using the standard protocol following bolus administration of intravenous contrast. CONTRAST:  166mL OMNIPAQUE IOHEXOL 300 MG/ML  SOLN COMPARISON:  None. FINDINGS: Lower chest:  Coronary atherosclerosis.  Lung bases are clear. Hepatobiliary: No focal liver abnormality.No evidence of biliary obstruction or stone. Pancreas: Unremarkable. Spleen: Unremarkable. Adrenals/Urinary Tract: Negative adrenals. No hydronephrosis or ureteral stone. There are 2 left renal calculi measuring up to 1 cm at the lower pole. Left renal cortical and hilar cysts. [place] calculus in the bladder measuring 2 cm. Mild bladder wall thickening and lobulation likely from small cellules. Stomach/Bowel: Dilated bowel above a bowel containing left inguinal hernia. No evident perforation or bowel necrosis. There is extensive left colonic diverticulosis. Vascular/Lymphatic: No acute vascular abnormality. Atherosclerotic calcification. No mass or adenopathy. Reproductive:Enlarged prostate. Other: Left inguinal hernia as noted above. Small fatty umbilical hernia. Trace pelvic fluid considered reactive. Musculoskeletal: Advanced spinal degeneration with lumbar levoscoliosis. No acute or aggressive finding IMPRESSION: 1. High-grade small bowel obstruction due to left inguinal hernia. 2. Left renal and bladder calculi. Electronically Signed   By: Monte Fantasia M.D.   On: 05/02/2019 05:13      Assessment/Plan Aortic root dilatation Hx atrial fibrillation -on Eliquis: LD 05/01/2019 AM dose only Hx cardiomegaly Hypertension Hypothyroid Osteoarthritis Hx peripheral neuropathy GERD  Left inguinal hernia with recurrent SBO  FEN: IV fluids/clear liquids ID: None DVT: Eliquis/he can be transition to IV heparin if necessary. Follow-up: TBD  Plan: Currently his hernia appears to be reduced.  He did not get oral contrast so were going to give him some Gastrografin  orally and do the small bowel protocol.  He currently does not appear to be obstructed.  The hernia currently appears to be reduced.  The aim would be to allow his Eliquis to wear off.  Then we could repair the hernia in the next 24 to 48 hours.  We will order the small bowel protocol, he can have clear liquids, he needs IV fluids for rehydration, and will defer that to Medicine.  Recheck labs in a.m. make him n.p.o. after midnight.  We will also check a lactate later this afternoon at 1300 and 1 in the AM.    Earnstine Regal, [facility] Surgery 05/02/2019, 7:17 AM Please see Amion for pager number during day hours 7:00am-4:30pm

## 2019-05-02 NOTE — ED Triage Notes (Addendum)
Pt BIB [facility] from home where he lives with his wife. Reports vomiting for the last 2 hours. Endorses LLQ abdominal pain x 2 weeks hx of hernia surgery on that side. No distention or discoloration noted.

## 2019-05-02 NOTE — ED Provider Notes (Signed)
[facility] DEPT Provider Note   CSN: [id number] Arrival date & time: 05/02/19  [id number]     History No chief complaint on file.   Tom Norton is a 86 y.o. male.  Patient to ED with complaint of abdominal pain, constipation, nausea and vomiting. He reports pain and soreness at the site of a repaired left inguinal hernia 2 weeks prior to Christmas after lifting 2 bags of gardening stones. He noticed a bulge in the same area as the hernia repair. His pain steadily increased until Christmas day when it started to ease off. He reports recurrent pain now x 3-4 days, constipation x 3 days despite suppository and laxative use, no flatus and today having nausea with vomiting which prompted his wife to call EMS. No fever at any time. No difficulty urinating. He states he has eaten very little in the past 48 hours due to loss of appetite. No chest pain, SOB. Nausea is currently resolved.   The history is provided by the patient.       Past Medical History:  Diagnosis Date  . Acute renal insufficiency    a. 09/2013: dehydration.  . Anemia    history childhood  . Ankle fracture   . Cardiomegaly   . Dilated aortic root ([place])    a. 09/2013: 4.2cm by echo.  . Enlarged prostate without lower urinary tract symptoms (luts)   . GERD (gastroesophageal reflux disease)    rare  . High cholesterol   . History of hiatal hernia   . Hyperlipidemia   . Hypertension   . Hypotension    a. 09/2013 adm felt due to dehydration.  . Hypothyroidism   . Insomnia   . Junctional bradycardia   . Left arm numbness    Will see Dr. Jannifer Franklin July 17, 2017  . OA (osteoarthritis)   . Obesity   . Paroxysmal A-fib ([place])    a. 09/23/13 a-fib with RVR and hypotensive, requiring emergent cardioversion in ED  . Peripheral neuropathy 07/17/2017  . Sinus bradycardia   . Tick bite   . Vitamin D deficiency     Patient Active Problem List   Diagnosis Date Noted  . Primary osteoarthritis of  left knee 07/20/2017  . S/P knee replacement 07/20/2017  . Peripheral neuropathy 07/17/2017  . Paroxysmal atrial fibrillation ([place]) 10/31/2015  . Hypothyroidism 10/31/2015  . Dilated aortic root ([place]) 10/08/2014  . Tick bite of right thigh 08/18/2014  . Rash 12/10/2013  . Hypotension 09/23/2013  . Renal insufficiency 09/23/2013  . Hypothyroid 09/23/2013    Past Surgical History:  Procedure Laterality Date  . CARDIOVERSION  2015  . HERNIA REPAIR    . TOTAL KNEE ARTHROPLASTY Left 07/20/2017   Procedure: LEFT TOTAL KNEE ARTHROPLASTY;  Surgeon: Sydnee Cabal, MD;  Location: [facility];  Service: Orthopedics;  Laterality: Left;  Adductor Block  . TRANSTHORACIC ECHOCARDIOGRAM  09/23/2013   EF 55-60%; normal wall motion. Moderately dilated aortic root and ascending aorta; mild AI; moderate bi-atrial enlargement       Family History  Problem Relation Age of Onset  . Heart disease Father   . Stroke Brother   . Heart disease Brother     Social History   Tobacco Use  . Smoking status: Never Smoker  . Smokeless tobacco: Never Used  Substance Use Topics  . Alcohol use: Yes    Alcohol/week: 10.0 standard drinks    Types: 10 Glasses of wine per week  . Drug use: No  Home Medications Prior to Admission medications   Medication Sig Start Date End Date Taking? Authorizing Provider  Cholecalciferol (VITAMIN D3) 2000 units TABS Take 2,000 Units by mouth daily.    [provider]  CINNAMON PO Take 1,000 mg by mouth daily.     [provider]  Coenzyme Q10 (CO Q10) 100 MG CAPS Take 100 mg by mouth daily.    [provider]  diphenhydrAMINE (BENADRYL) 25 mg capsule Take 50 mg by mouth daily as needed for allergies.    [provider]  ELIQUIS 5 MG TABS tablet TAKE 1 TABLET BY MOUTH 2 TIMES DAILY. 12/05/18   Croitoru, Mihai, MD  EPINEPHrine 0.3 mg/0.3 mL IJ SOAJ injection Inject 0.3 mg into the muscle daily as needed (allergic reaction).    [provider]  eszopiclone (LUNESTA) 1 MG TABS tablet Take 1-2 mg by mouth at bedtime. Take immediately before bedtime    [provider]  levothyroxine (SYNTHROID, LEVOTHROID) 25 MCG tablet Take 25 mcg by mouth daily before breakfast.    [provider]  MELATONIN ER PO Take by mouth.    [provider]  Multiple Vitamin (MULTIVITAMIN WITH MINERALS) TABS Take 1 tablet by mouth daily.    [provider]  Tamsulosin HCl (FLOMAX) 0.4 MG CAPS Take 0.4 mg by mouth daily after supper.    [provider]  UNABLE TO FIND Take 1 tablet by mouth every evening. Med Name: Protandim    [provider]    Allergies    Doxycycline hyclate and Other  Review of Systems   Review of Systems  Constitutional: Positive for appetite change. Negative for chills and fever.  HENT: Negative.   Respiratory: Negative.  Negative for shortness of breath.   Cardiovascular: Negative.   Gastrointestinal: Positive for abdominal pain, constipation, nausea and vomiting.  Genitourinary: Negative for difficulty urinating, dysuria, scrotal swelling and testicular pain.  Musculoskeletal: Negative.   Skin: Negative.   Neurological: Negative.     Physical Exam Updated Vital Signs BP (!) 147/103 (BP Location: Right Arm)   Pulse 88   Temp (!) 97.3 F (36.3 C) (Oral)   Resp 16   SpO2 97%   Physical Exam Vitals and nursing note reviewed.  Constitutional:      Appearance: He is well-developed.  Pulmonary:     Effort: Pulmonary effort is normal.  Abdominal:     General: Bowel sounds are absent. There is no distension.     Palpations: Abdomen is soft.     Tenderness: There is no abdominal tenderness.     Hernia: A hernia is present. Hernia is present in the left inguinal area.     Comments: Hernia is soft, nontender  Genitourinary:    Testes: Normal.        Right: Tenderness not present.        Left: Tenderness not present.  Musculoskeletal:        General:  Normal range of motion.     Cervical back: Normal range of motion.  Skin:    General: Skin is warm and dry.  Neurological:     Mental Status: He is alert and oriented to person, place, and time.     ED Results / Procedures / Treatments   Labs (all labs ordered are listed, but only abnormal results are displayed) Labs Reviewed  COMPREHENSIVE METABOLIC PANEL - Abnormal; Notable for the following components:      Result Value   Glucose, Bld 177 (*)  Calcium 10.4 (*)    All other components within normal limits  CBC - Abnormal; Notable for the following components:   WBC 15.9 (*)    Hemoglobin 17.2 (*)    All other components within normal limits  LIPASE, BLOOD  URINALYSIS, ROUTINE W REFLEX MICROSCOPIC    EKG None  Radiology No results found.  Procedures Procedures (including critical care time)  Medications Ordered in ED Medications  sodium chloride flush (NS) 0.9 % injection 3 mL (has no administration in time range)    ED Course  I have reviewed the triage vital signs and the nursing notes.  Pertinent labs & imaging results that were available during my care of the patient were reviewed by me and considered in my medical decision making (see chart for details).    MDM Rules/Calculators/A&P                      Patient to ED with intermittent abdominal pain felt related to a left inguinal hernia since prior to Christmas. Now with constipation and no flatus x 3 days and N/V tonight. No fever.   He appears overall well. No distress, pain or nausea at present. No fever, VSS. Abdominal exam is essentially benign.   CT scan ordered and shows a high grade SBO secondary to left inguinal hernia. Surgery paged. COVID (2-hour) collected. Will discuss further care measures with Dr. Brantley Stage.  Discussed with Dr. Brantley Stage. Will consult medicine for admission secondary to age and medical conditions, specifically, A-fib and coagulopathy secondary to Eliquis. Will discuss  reversal with medicine and pharmacy.  Patient is having some nausea. No vomiting while in ED. Per Dr. Brantley Stage, can avoid NG tube insertion for the time being.   Patient updated on results and plan for admission. Surgery to see in ED.   Final Clinical Impression(s) / ED Diagnoses Final diagnoses:  None   1. High grade SBO secondary to left inguinal hernia 2. Coagulopathy  Rx / DC Orders ED Discharge Orders    None       Charlann Lange, PA-C 05/02/19 0636    Fatima Blank, MD 05/03/19 1306

## 2019-05-02 NOTE — Progress Notes (Signed)
Critical lab value   Lactic Acid is 2.7.  Notified MD by [facility] page. (Dr. Lorin Mercy admitting MD)

## 2019-05-03 ENCOUNTER — Encounter (HOSPITAL_COMMUNITY)
Admission: EM | Disposition: A | Discharge status: Home / Self Care | Payer: Self-pay | Source: Home / Self Care | Attending: Internal Medicine

## 2019-05-03 ENCOUNTER — Encounter (HOSPITAL_COMMUNITY): Payer: Self-pay | Admitting: Internal Medicine

## 2019-05-03 ENCOUNTER — Inpatient Hospital Stay (HOSPITAL_COMMUNITY): Payer: Medicare Other | Admitting: Anesthesiology

## 2019-05-03 DIAGNOSIS — I10 Essential (primary) hypertension: Secondary | ICD-10-CM | POA: Diagnosis not present

## 2019-05-03 DIAGNOSIS — K56609 Unspecified intestinal obstruction, unspecified as to partial versus complete obstruction: Secondary | ICD-10-CM

## 2019-05-03 DIAGNOSIS — I480 Paroxysmal atrial fibrillation: Secondary | ICD-10-CM

## 2019-05-03 DIAGNOSIS — E860 Dehydration: Secondary | ICD-10-CM

## 2019-05-03 DIAGNOSIS — E039 Hypothyroidism, unspecified: Secondary | ICD-10-CM

## 2019-05-03 DIAGNOSIS — E785 Hyperlipidemia, unspecified: Secondary | ICD-10-CM | POA: Diagnosis not present

## 2019-05-03 DIAGNOSIS — K4091 Unilateral inguinal hernia, without obstruction or gangrene, recurrent: Secondary | ICD-10-CM | POA: Diagnosis not present

## 2019-05-03 HISTORY — PX: INGUINAL HERNIA REPAIR: SHX194

## 2019-05-03 LAB — COMPREHENSIVE METABOLIC PANEL
ALT: 15 U/L (ref 0–44)
AST: 20 U/L (ref 15–41)
Albumin: 3.7 g/dL (ref 3.5–5.0)
Alkaline Phosphatase: 60 U/L (ref 38–126)
Anion gap: 9 (ref 5–15)
BUN: 13 mg/dL (ref 8–23)
CO2: 24 mmol/L (ref 22–32)
Calcium: 9.3 mg/dL (ref 8.9–10.3)
Chloride: 107 mmol/L (ref 98–111)
Creatinine, Ser: 1.05 mg/dL (ref 0.61–1.24)
GFR calc Af Amer: >60 mL/min (ref >60)
GFR calc non Af Amer: >60 mL/min (ref >60)
Glucose, Bld: 114 mg/dL — ABNORMAL HIGH (ref 70–99)
Potassium: 3.8 mmol/L (ref 3.5–5.1)
Sodium: 140 mmol/L (ref 135–145)
Total Bilirubin: 1.5 mg/dL — ABNORMAL HIGH (ref 0.3–1.2)
Total Protein: 6.2 g/dL — ABNORMAL LOW (ref 6.5–8.1)

## 2019-05-03 LAB — PROTIME-INR
INR: 1.2 (ref 0.8–1.2)
Prothrombin Time: 14.7 seconds (ref 11.4–15.2)

## 2019-05-03 LAB — CBC
HCT: 46.9 % (ref 39.0–52.0)
Hemoglobin: 15.1 g/dL (ref 13.0–17.0)
MCH: 31.7 pg (ref 26.0–34.0)
MCHC: 32.2 g/dL (ref 30.0–36.0)
MCV: 98.3 fL (ref 80.0–100.0)
Platelets: 225 10*3/uL (ref 150–400)
RBC: 4.77 MIL/uL (ref 4.22–5.81)
RDW: 13.2 % (ref 11.5–15.5)
WBC: 8.7 10*3/uL (ref 4.0–10.5)
nRBC: 0 % (ref 0.0–0.2)

## 2019-05-03 LAB — GLUCOSE, CAPILLARY
Glucose-Capillary: 96 mg/dL (ref 70–99)
Glucose-Capillary: 112 mg/dL — ABNORMAL HIGH (ref 70–99)
Glucose-Capillary: 154 mg/dL — ABNORMAL HIGH (ref 70–99)
Glucose-Capillary: 136 mg/dL — ABNORMAL HIGH (ref 70–99)

## 2019-05-03 LAB — LACTIC ACID, PLASMA: Lactic Acid, Venous: 1.5 mmol/L (ref 0.5–1.9)

## 2019-05-03 LAB — MRSA PCR SCREENING: MRSA by PCR: NEGATIVE

## 2019-05-03 SURGERY — REPAIR, HERNIA, INGUINAL, ADULT
Anesthesia: General | Site: Abdomen | Laterality: Left

## 2019-05-03 MED ORDER — 0.9 % SODIUM CHLORIDE (POUR BTL) OPTIME
TOPICAL | Status: DC | PRN
  Administered 2019-05-03: 1000 mL

## 2019-05-03 MED ORDER — ONDANSETRON HCL 4 MG/2ML IJ SOLN: 4.0000 mg | Freq: Once | INTRAMUSCULAR | Status: DC | PRN

## 2019-05-03 MED ORDER — ACETAMINOPHEN 650 MG RE SUPP: 650.0000 mg | Freq: Four times a day (QID) | RECTAL | Status: DC | PRN

## 2019-05-03 MED ORDER — ARTIFICIAL TEARS OPHTHALMIC OINT
TOPICAL_OINTMENT | OPHTHALMIC | Status: AC
  Filled 2019-05-03: qty 3.5

## 2019-05-03 MED ORDER — TRAMADOL HCL 50 MG PO TABS: 50.0000 mg | ORAL_TABLET | Freq: Four times a day (QID) | ORAL | Status: DC | PRN

## 2019-05-03 MED ORDER — CHLORHEXIDINE GLUCONATE CLOTH 2 % EX PADS: 6.0000 | MEDICATED_PAD | Freq: Once | CUTANEOUS | Status: DC

## 2019-05-03 MED ORDER — ACETAMINOPHEN 10 MG/ML IV SOLN
INTRAVENOUS | Status: DC | PRN
  Administered 2019-05-03: 1000 mg via INTRAVENOUS

## 2019-05-03 MED ORDER — BUPIVACAINE LIPOSOME 1.3 % IJ SUSP
20.0000 mL | Freq: Once | INTRAMUSCULAR | Status: AC
  Administered 2019-05-03: 12:00:00 20 mL
  Filled 2019-05-03: qty 20

## 2019-05-03 MED ORDER — ONDANSETRON HCL 4 MG/2ML IJ SOLN
INTRAMUSCULAR | Status: AC
  Filled 2019-05-03: qty 2

## 2019-05-03 MED ORDER — ROCURONIUM BROMIDE 10 MG/ML (PF) SYRINGE
PREFILLED_SYRINGE | INTRAVENOUS | Status: DC | PRN
  Administered 2019-05-03: 45 mg via INTRAVENOUS
  Administered 2019-05-03: 5 mg via INTRAVENOUS
  Administered 2019-05-03: 10 mg via INTRAVENOUS

## 2019-05-03 MED ORDER — CEFAZOLIN SODIUM-DEXTROSE 2-4 GM/100ML-% IV SOLN
2.0000 g | INTRAVENOUS | Status: AC
  Administered 2019-05-03: 2 g via INTRAVENOUS

## 2019-05-03 MED ORDER — BUPIVACAINE HCL (PF) 0.5 % IJ SOLN
INTRAMUSCULAR | Status: DC | PRN
  Administered 2019-05-03: 20 mL

## 2019-05-03 MED ORDER — HYDROMORPHONE HCL 1 MG/ML IJ SOLN: 1.0000 mg | INTRAMUSCULAR | Status: DC | PRN

## 2019-05-03 MED ORDER — KETOROLAC TROMETHAMINE 30 MG/ML IJ SOLN
INTRAMUSCULAR | Status: AC
  Filled 2019-05-03: qty 1

## 2019-05-03 MED ORDER — EPHEDRINE 5 MG/ML INJ
INTRAVENOUS | Status: AC
  Filled 2019-05-03: qty 10

## 2019-05-03 MED ORDER — FENTANYL CITRATE (PF) 100 MCG/2ML IJ SOLN
INTRAMUSCULAR | Status: AC
  Filled 2019-05-03: qty 2

## 2019-05-03 MED ORDER — BUPIVACAINE HCL (PF) 0.5 % IJ SOLN
INTRAMUSCULAR | Status: AC
  Filled 2019-05-03: qty 30

## 2019-05-03 MED ORDER — ROCURONIUM BROMIDE 10 MG/ML (PF) SYRINGE
PREFILLED_SYRINGE | INTRAVENOUS | Status: AC
  Filled 2019-05-03: qty 10

## 2019-05-03 MED ORDER — KCL IN DEXTROSE-NACL 20-5-0.45 MEQ/L-%-% IV SOLN
INTRAVENOUS | Status: DC
  Filled 2019-05-03 (×2): qty 1000

## 2019-05-03 MED ORDER — FENTANYL CITRATE (PF) 100 MCG/2ML IJ SOLN: 25.0000 ug | INTRAMUSCULAR | Status: DC | PRN

## 2019-05-03 MED ORDER — PHENYLEPHRINE 40 MCG/ML (10ML) SYRINGE FOR IV PUSH (FOR BLOOD PRESSURE SUPPORT)
PREFILLED_SYRINGE | INTRAVENOUS | Status: AC
  Filled 2019-05-03: qty 10

## 2019-05-03 MED ORDER — PHENYLEPHRINE HCL (PRESSORS) 10 MG/ML IV SOLN
INTRAVENOUS | Status: DC | PRN
  Administered 2019-05-03 (×2): 80 ug via INTRAVENOUS
  Administered 2019-05-03: 120 ug via INTRAVENOUS

## 2019-05-03 MED ORDER — DEXAMETHASONE SODIUM PHOSPHATE 10 MG/ML IJ SOLN
INTRAMUSCULAR | Status: DC | PRN
  Administered 2019-05-03: 5 mg via INTRAVENOUS

## 2019-05-03 MED ORDER — ONDANSETRON 4 MG PO TBDP: 4.0000 mg | ORAL_TABLET | Freq: Four times a day (QID) | ORAL | Status: DC | PRN

## 2019-05-03 MED ORDER — TRAMADOL HCL 50 MG PO TABS: 50.0000 mg | ORAL_TABLET | Freq: Four times a day (QID) | ORAL | 0 refills | Status: DC | PRN

## 2019-05-03 MED ORDER — FENTANYL CITRATE (PF) 100 MCG/2ML IJ SOLN
INTRAMUSCULAR | Status: DC | PRN
  Administered 2019-05-03: 50 ug via INTRAVENOUS

## 2019-05-03 MED ORDER — PROPOFOL 10 MG/ML IV BOLUS
INTRAVENOUS | Status: DC | PRN
  Administered 2019-05-03: 110 mg via INTRAVENOUS

## 2019-05-03 MED ORDER — EPHEDRINE SULFATE 50 MG/ML IJ SOLN
INTRAMUSCULAR | Status: DC | PRN
  Administered 2019-05-03: 15 mg via INTRAVENOUS

## 2019-05-03 MED ORDER — KETOROLAC TROMETHAMINE 15 MG/ML IJ SOLN
INTRAMUSCULAR | Status: DC | PRN
  Administered 2019-05-03: 15 mg via INTRAVENOUS

## 2019-05-03 MED ORDER — OXYCODONE HCL 5 MG PO TABS: 5.0000 mg | ORAL_TABLET | ORAL | Status: DC | PRN

## 2019-05-03 MED ORDER — ACETAMINOPHEN 10 MG/ML IV SOLN
INTRAVENOUS | Status: AC
  Filled 2019-05-03: qty 100

## 2019-05-03 MED ORDER — SUGAMMADEX SODIUM 200 MG/2ML IV SOLN
INTRAVENOUS | Status: DC | PRN
  Administered 2019-05-03: 200 mg via INTRAVENOUS

## 2019-05-03 MED ORDER — DEXAMETHASONE SODIUM PHOSPHATE 10 MG/ML IJ SOLN
INTRAMUSCULAR | Status: AC
  Filled 2019-05-03: qty 1

## 2019-05-03 MED ORDER — ONDANSETRON HCL 4 MG/2ML IJ SOLN: 4.0000 mg | Freq: Four times a day (QID) | INTRAMUSCULAR | Status: DC | PRN

## 2019-05-03 MED ORDER — ACETAMINOPHEN 325 MG PO TABS: 650.0000 mg | ORAL_TABLET | Freq: Four times a day (QID) | ORAL | Status: DC | PRN

## 2019-05-03 MED ORDER — SUCCINYLCHOLINE CHLORIDE 200 MG/10ML IV SOSY
PREFILLED_SYRINGE | INTRAVENOUS | Status: AC
  Filled 2019-05-03: qty 10

## 2019-05-03 MED ORDER — PROPOFOL 10 MG/ML IV BOLUS
INTRAVENOUS | Status: AC
  Filled 2019-05-03: qty 20

## 2019-05-03 MED ORDER — CHLORHEXIDINE GLUCONATE CLOTH 2 % EX PADS
6.0000 | MEDICATED_PAD | Freq: Once | CUTANEOUS | Status: DC
  Administered 2019-05-03: 10:00:00 6 via TOPICAL

## 2019-05-03 MED ORDER — LIDOCAINE 2% (20 MG/ML) 5 ML SYRINGE
INTRAMUSCULAR | Status: AC
  Filled 2019-05-03: qty 5

## 2019-05-03 MED ORDER — LIDOCAINE 2% (20 MG/ML) 5 ML SYRINGE
INTRAMUSCULAR | Status: DC | PRN
  Administered 2019-05-03: 100 mg via INTRAVENOUS

## 2019-05-03 MED ORDER — ACETAMINOPHEN 10 MG/ML IV SOLN: 1000.0000 mg | Freq: Once | INTRAVENOUS | Status: DC | PRN

## 2019-05-03 MED ORDER — CEFAZOLIN SODIUM-DEXTROSE 2-4 GM/100ML-% IV SOLN
INTRAVENOUS | Status: AC
  Filled 2019-05-03: qty 100

## 2019-05-03 SURGICAL SUPPLY — 33 items
BLADE SURG 15 STRL LF DISP TIS (BLADE) ×1 IMPLANT
BLADE SURG 15 STRL SS (BLADE) ×1
CHLORAPREP W/TINT 26 (MISCELLANEOUS) ×4 IMPLANT
COVER SURGICAL LIGHT HANDLE (MISCELLANEOUS) ×2 IMPLANT
COVER WAND RF STERILE (DRAPES) ×2 IMPLANT
DECANTER SPIKE VIAL GLASS SM (MISCELLANEOUS) ×2 IMPLANT
DERMABOND ADVANCED (GAUZE/BANDAGES/DRESSINGS) ×1
DERMABOND ADVANCED .7 DNX12 (GAUZE/BANDAGES/DRESSINGS) ×1 IMPLANT
DRAIN PENROSE 18X1/2 LTX STRL (DRAIN) ×2 IMPLANT
DRAPE LAPAROTOMY TRNSV 102X78 (DRAPES) ×2 IMPLANT
ELECT REM PT RETURN 15FT ADLT (MISCELLANEOUS) ×2 IMPLANT
GLOVE BIOGEL PI IND STRL 7.0 (GLOVE) ×1 IMPLANT
GLOVE BIOGEL PI INDICATOR 7.0 (GLOVE) ×1
GLOVE SURG ORTHO 8.0 STRL STRW (GLOVE) ×2 IMPLANT
GOWN STRL REUS W/TWL XL LVL3 (GOWN DISPOSABLE) ×4 IMPLANT
KIT BASIN OR (CUSTOM PROCEDURE TRAY) ×2 IMPLANT
KIT TURNOVER KIT A (KITS) IMPLANT
MESH ULTRAPRO 3X6 7.6X15CM (Mesh General) ×2 IMPLANT
NEEDLE HYPO 25X1 1.5 SAFETY (NEEDLE) ×2 IMPLANT
NS IRRIG 1000ML POUR BTL (IV SOLUTION) ×2 IMPLANT
PACK BASIC VI WITH GOWN DISP (CUSTOM PROCEDURE TRAY) ×2 IMPLANT
PENCIL SMOKE EVACUATOR (MISCELLANEOUS) IMPLANT
SPONGE LAP 4X18 RFD (DISPOSABLE) ×6 IMPLANT
STRIP CLOSURE SKIN 1/2X4 (GAUZE/BANDAGES/DRESSINGS) ×2 IMPLANT
SUT MNCRL AB 4-0 PS2 18 (SUTURE) ×2 IMPLANT
SUT NOVA NAB GS-21 0 18 T12 DT (SUTURE) IMPLANT
SUT NOVA NAB GS-22 2 0 T19 (SUTURE) ×8 IMPLANT
SUT SILK 2 0 SH (SUTURE) ×2 IMPLANT
SUT VIC AB 3-0 SH 18 (SUTURE) ×2 IMPLANT
SYR BULB IRRIGATION 50ML (SYRINGE) ×2 IMPLANT
SYR CONTROL 10ML LL (SYRINGE) ×2 IMPLANT
TOWEL OR 17X26 10 PK STRL BLUE (TOWEL DISPOSABLE) ×2 IMPLANT
YANKAUER SUCT BULB TIP 10FT TU (MISCELLANEOUS) ×2 IMPLANT

## 2019-05-03 NOTE — Progress Notes (Signed)
Pt ate a clear tray without difficulty. Will advance diet.  Pt continues on O2 at 2 L/min. With sats 93%. Will try to wean as day continues. Spouse of pt has stated that she does not want pt to go home today. She is requesting he spend the night, as his last hernia surgery he came home after and was in considerable pain. Will continue to monitor.

## 2019-05-03 NOTE — Anesthesia Procedure Notes (Addendum)
Procedure Name: Intubation Date/Time: 05/03/2019 10:17 AM Performed by: Wanita Chamberlain, CRNA Pre-anesthesia Checklist: Patient identified, Emergency Drugs available, Suction available, Patient being monitored and Timeout performed Patient Re-evaluated:Patient Re-evaluated prior to induction Oxygen Delivery Method: Circle system utilized Preoxygenation: Pre-oxygenation with 100% oxygen Induction Type: IV induction Ventilation: Mask ventilation without difficulty Grade View: Grade II Tube size: 7.0 mm Number of attempts: 1 Airway Equipment and Method: Stylet Placement Confirmation: ETT inserted through vocal cords under direct vision,  positive ETCO2,  CO2 detector and breath sounds checked- equal and bilateral Secured at: 23 cm Tube secured with: Tape Dental Injury: Teeth and Oropharynx as per pre-operative assessment

## 2019-05-03 NOTE — Progress Notes (Signed)
Assessment & Plan: Recurrent left inguinal hernia with small bowel obstruction  Hernia reduced yesterday obstruction resolved by AXR with gastrograffin  Eliquis held for 2 days  Plan to proceed with open repair LIH with mesh this AM  Possible discharge home later today  The risks and benefits of the procedure have been discussed at length with the patient.  The patient understands the proposed procedure, potential alternative treatments, and the course of recovery to be expected.  All of the patient's questions have been answered at this time.  The patient wishes to proceed with surgery.         Tom Gemma, MD       [facility]       Office: [phone]   Chief Complaint: Recurrent LIH, small bowel obstruction  Subjective: Patient comfortable, anxious to have surgery and go home  Objective: Vital signs in last 24 hours: Temp:  [98.1 F (36.7 C)-98.5 F (36.9 C)] 98.1 F (36.7 C) (01/09 0520) Pulse Rate:  [61-81] 61 (01/09 0520) Resp:  [16-20] 16 (01/09 0520) BP: (120-127)/(74-79) 120/77 (01/09 0520) SpO2:  [92 %-94 %] 92 % (01/09 0520) Last BM Date: 05/02/19  Intake/Output from previous day: 01/08 0701 - 01/09 0700 In: 1767.8 [P.O.:660; I.V.:1107.8] Out: -  Intake/Output this shift: No intake/output data recorded.  Physical Exam: HEENT - sclerae clear, mucous membranes moist Neck - soft Abdomen - soft, non-distended; non-tender; no palpable mass or bulge in groin this AM Ext - no edema, non-tender Neuro - alert & oriented, no focal deficits  Lab Results:  Recent Labs    05/02/19 0132 05/03/19 0600  WBC 15.9* 8.7  HGB 17.2* 15.1  HCT 51.6 46.9  PLT 267 225   BMET Recent Labs    05/02/19 0132 05/03/19 0600  NA 142 140  K 4.0 3.8  CL 105 107  CO2 22 24  GLUCOSE 177* 114*  BUN 13 13  CREATININE 1.06 1.05  CALCIUM 10.4* 9.3   PT/INR Recent Labs    05/03/19 0753  LABPROT 14.7  INR 1.2   Comprehensive Metabolic  Panel:    Component Value Date/Time   NA 140 05/03/2019 0600   NA 142 05/02/2019 0132   K 3.8 05/03/2019 0600   K 4.0 05/02/2019 0132   CL 107 05/03/2019 0600   CL 105 05/02/2019 0132   CO2 24 05/03/2019 0600   CO2 22 05/02/2019 0132   BUN 13 05/03/2019 0600   BUN 13 05/02/2019 0132   CREATININE 1.05 05/03/2019 0600   CREATININE 1.06 05/02/2019 0132   GLUCOSE 114 (H) 05/03/2019 0600   GLUCOSE 177 (H) 05/02/2019 0132   CALCIUM 9.3 05/03/2019 0600   CALCIUM 10.4 (H) 05/02/2019 0132   AST 20 05/03/2019 0600   AST 20 05/02/2019 0132   ALT 15 05/03/2019 0600   ALT 18 05/02/2019 0132   ALKPHOS 60 05/03/2019 0600   ALKPHOS 78 05/02/2019 0132   BILITOT 1.5 (H) 05/03/2019 0600   BILITOT 1.0 05/02/2019 0132   PROT 6.2 (L) 05/03/2019 0600   PROT 7.5 05/02/2019 0132   PROT 6.5 07/17/2017 1416   ALBUMIN 3.7 05/03/2019 0600   ALBUMIN 4.5 05/02/2019 0132    Studies/Results: CT ABDOMEN PELVIS W CONTRAST  Result Date: 05/02/2019 CLINICAL DATA:  Bowel obstruction suspected. Left lower quadrant pain for 2 weeks EXAM: CT ABDOMEN AND PELVIS WITH CONTRAST TECHNIQUE: Multidetector CT imaging of the abdomen and pelvis was performed using the standard protocol following bolus administration  of intravenous contrast. CONTRAST:  147mL OMNIPAQUE IOHEXOL 300 MG/ML  SOLN COMPARISON:  None. FINDINGS: Lower chest:  Coronary atherosclerosis.  Lung bases are clear. Hepatobiliary: No focal liver abnormality.No evidence of biliary obstruction or stone. Pancreas: Unremarkable. Spleen: Unremarkable. Adrenals/Urinary Tract: Negative adrenals. No hydronephrosis or ureteral stone. There are 2 left renal calculi measuring up to 1 cm at the lower pole. Left renal cortical and hilar cysts. Jackstone calculus in the bladder measuring 2 cm. Mild bladder wall thickening and lobulation likely from small cellules. Stomach/Bowel: Dilated bowel above a bowel containing left inguinal hernia. No evident perforation or bowel necrosis.  There is extensive left colonic diverticulosis. Vascular/Lymphatic: No acute vascular abnormality. Atherosclerotic calcification. No mass or adenopathy. Reproductive:Enlarged prostate. Other: Left inguinal hernia as noted above. Small fatty umbilical hernia. Trace pelvic fluid considered reactive. Musculoskeletal: Advanced spinal degeneration with lumbar levoscoliosis. No acute or aggressive finding IMPRESSION: 1. High-grade small bowel obstruction due to left inguinal hernia. 2. Left renal and bladder calculi. Electronically Signed   By: Monte Fantasia M.D.   On: 05/02/2019 05:13   DG Abd Portable 1V  Result Date: 05/02/2019 CLINICAL DATA:  8 hour delay film for possible small bowel obstruction EXAM: PORTABLE ABDOMEN - 1 VIEW COMPARISON:  Film from earlier in the same day. FINDINGS: Scattered large and small bowel gas is noted. The previously seen small bowel dilatation has resolved in the interval. Contrast material administered previously now lies within the colon predominately within the rectum. Diverticular change of the colon is seen. No free air is identified. Degenerative changes of lumbar spine are seen. IMPRESSION: Resolution of previously seen small bowel dilatation. This is consistent with the given clinical history of reduction of the left inguinal hernia. Contrast material administered now lies within the colon primarily distally. Electronically Signed   By: Inez Catalina M.D.   On: 05/02/2019 19:53   DG Abd Portable 1V-Small Bowel Obstruction Protocol-initial, 8 hr delay  Result Date: 05/02/2019 CLINICAL DATA:  Progressive abdominal pain with nausea and vomiting EXAM: PORTABLE ABDOMEN - 1 VIEW COMPARISON:  CT abdomen and pelvis May 02, 2019 obtained earlier in the day FINDINGS: Multiple loops of bowel remain dilated with borderline thickening of multiple areas of bowel wall consistent with the bowel obstruction noted on recent CT. No free air evident. Contrast is seen in the collecting  systems, ureters, and urinary bladder. IMPRESSION: Persistent bowel dilatation with borderline bowel wall thickening at multiple sites. Findings indicative of bowel obstruction. No free air appreciable on supine examination. Electronically Signed   By: Lowella Grip III M.D.   On: 05/02/2019 08:52      Tom Norton 05/03/2019  Patient ID: [name], male   DOB: [birth year], 86 y.o.   MRN: [id number]

## 2019-05-03 NOTE — Transfer of Care (Signed)
Immediate Anesthesia Transfer of Care Note  Patient: Tom Norton  Procedure(s) Performed: HERNIA REPAIR INGUINAL ADULT WITH MESH (Left Abdomen)  Patient Location: PACU  Anesthesia Type:General  Level of Consciousness: awake, alert , oriented and patient cooperative  Airway & Oxygen Therapy: Patient Spontanous Breathing and Patient connected to face mask oxygen  Post-op Assessment: Report given to RN and Post -op Vital signs reviewed and stable  Post vital signs: Reviewed and stable  Last Vitals:  Vitals Value Taken Time  BP    Temp    Pulse    Resp    SpO2      Last Pain:  Vitals:   05/02/19 1958  TempSrc:   PainSc: 0-No pain         Complications: No apparent anesthesia complications

## 2019-05-03 NOTE — Progress Notes (Signed)
Pt has arrived back to Room 1513 from PACU. Alert and oriented.

## 2019-05-03 NOTE — Op Note (Signed)
Recurrent Left Inguinal Hernia, Open, Procedure Note  Pre-operative Diagnosis:  Recurrent left inguinal herni with history of small bowel obstruction  Post-operative Diagnosis: same  Surgeon:  Armandina Gemma, MD  Anesthesia:  General  Preparation:  Chlora-prep  Estimated Blood Loss: minimal  Complications:  none  Indications: The patient presented with a left, reducible recurrent inguinal hernia.    Procedure Details  The patient was evaluated in the holding area. All of the patient's questions were answered and the proposed procedure was confirmed. The site of the procedure was properly marked. The patient was taken to the Operating Room, identified by name, and the procedure verified as inguinal hernia repair.  Following administration of general anesthesia the previous left inguinal incision is reopened with a #15 blade.  Dissection is carried into the subcutaneous tissues and hemostasis achieved with the electrocautery.  Dissection is carried through scar tissue down to the external oblique fascia.  The fascial plane was developed allowing for identification of structures including the pubic tubercle and the external inguinal ring.  External oblique fascia was incised and extended through the external ring.  Cord structures were dissected out of the inguinal canal.  There was a moderate amount of scar tissue present.  Old permanent suture material was identified.  Cord structures were dissected out and the cord was explored.  There was a moderately large indirect inguinal hernia sac present.  This was dissected out down to the level of the internal inguinal ring.  A high ligation of the sac was performed with a #1 Novafil pursestring suture.  The sac was excised and discarded.  Next the floor the inguinal canal was recreated with a sheet of Ethicon ultrapro mesh.  Mesh was cut to the appropriate dimensions.  It was secured to the pubic tubercle and along the inguinal ligament with a running  2-0 Novafil suture.  Mesh was split to accommodate the cord structures.  Superior margin of the mesh was secured to the transversalis and internal oblique fascia with interrupted 2-0 Novafil simple sutures.  The tails of the mesh were overlapped lateral to the cord structures and secured to the inguinal ligament with an interrupted 2-0 Novafil suture.  Local field block is placed with a mixture of Marcaine and Exparel.  External oblique fascia is closed with interrupted 3-0 Vicryl sutures.  Subcutaneous tissues are closed with interrupted 3-0 Vicryl sutures.  Skin is closed with a running 4-0 Monocryl subcuticular suture.  Skin was anesthetized with a solution of Marcaine and Exparel.  Wound was washed and dried and Dermabond was applied.  Instrument, sponge, and needle counts were correct prior to closure and at the conclusion of the case.  The patient tolerated the procedure well.  The patient was awakened from anesthesia and brought to the recovery room in stable condition.  Armandina Gemma, MD [facility] Office: [phone]

## 2019-05-03 NOTE — Progress Notes (Signed)
PROGRESS NOTE  Tom Norton [id number] DOB: 22-Oct-1929 DOA: 05/02/2019 PCP: Josetta Huddle, MD   LOS: 1 day   Brief Narrative / Interim history: 86 year old male with paroxysmal A. fib on Eliquis, hypothyroidism, HTN, HLD, came into the hospital with abdominal pain.  He has a history of remote hernia repair.  He was picking some heavy rock bags from [employer] and following that noticed significant pain in his left groin with swelling.  He has been feeling on and off discomfort in that area for the few weeks prior.  He developed nausea vomiting and pain and came to the hospital.  He was found to have a small bowel obstruction due to hernia.  General surgery consulted and patient was taken to the OR on 1/9  Subjective / 24h Interval events: Feeling better this morning, awaiting surgery  Assessment & Plan: Principal Problem Small bowel obstruction associated with recurrent left inguinal hernia -Hernia was reduced manually by surgery, patient feels better this morning.  Concerned about recurrence.  His Eliquis has been on hold now for 48 hours -It appears that plan is for patient to go to the OR for repair  Active Problems Paroxysmal A. fib on Eliquis -Eliquis has been on hold, resume postop as per surgery  HTN/HLD -Not on any medications, not sure about the context of these diagnoses  BPH -On tamsulosin  Hypothyroidism -Continue Synthroid, TSH normal 2.6  Scheduled Meds: . Chlorhexidine Gluconate Cloth  6 each Topical Once   And  . Chlorhexidine Gluconate Cloth  6 each Topical Once  . [MAR Hold] docusate sodium  100 mg Oral BID  . [MAR Hold] insulin aspart  0-9 Units Subcutaneous TID WC  . [MAR Hold] levothyroxine  25 mcg Oral QAC breakfast  . [MAR Hold] tamsulosin  0.4 mg Oral QPC supper   Continuous Infusions: . acetaminophen    . ceFAZolin    .  ceFAZolin (ANCEF) IV    . lactated ringers 100 mL/hr at 05/03/19 1011   PRN Meds:.0.9 % irrigation (POUR BTL), [MAR Hold]  acetaminophen **OR** [MAR Hold] acetaminophen, [MAR Hold]  morphine injection, [MAR Hold] ondansetron **OR** [MAR Hold] ondansetron (ZOFRAN) IV, [MAR Hold] zolpidem  DVT prophylaxis: surgery  Code Status: Full code Family Communication: d/w patient  Disposition Plan: home when stable  Consultants:  General surgery   Procedures:  None   Microbiology  None   Antimicrobials: None     Objective: Vitals:   05/02/19 0921 05/02/19 1413 05/02/19 2009 05/03/19 0520  BP: (!) 151/100 122/74 127/79 120/77  Pulse: (!) 102 81 71 61  Resp: 20 18 20 16   Temp: 98 F (36.7 C) 98.3 F (36.8 C) 98.5 F (36.9 C) 98.1 F (36.7 C)  TempSrc: Oral Oral    SpO2: 96% 94% 93% 92%  Weight:      Height:        Intake/Output Summary (Last 24 hours) at 05/03/2019 1058 Last data filed at 05/03/2019 0600 Gross per 24 hour  Intake 1767.77 ml  Output --  Net 1767.77 ml   Filed Weights   05/02/19 0758  Weight: 94.3 kg    Examination:  Constitutional: NAD Eyes: no scleral icterus ENMT: Mucous membranes are moist.  Respiratory: clear to auscultation bilaterally, no wheezing, no crackles. Normal respiratory effort. No accessory muscle use.  Cardiovascular: Regular rate and rhythm. No LE edema. Good peripheral pulses Abdomen: non distended, no tenderness. Bowel sounds positive.  Mild swelling left groin Musculoskeletal: no clubbing / cyanosis.  Skin: no  rashes Neurologic: Grossly nonfocal  Data Reviewed: I have independently reviewed following labs and imaging studies   CBC: Recent Labs  Lab 05/02/19 0132 05/03/19 0600  WBC 15.9* 8.7  HGB 17.2* 15.1  HCT 51.6 46.9  MCV 94.7 98.3  PLT 267 [id number]   Basic Metabolic Panel: Recent Labs  Lab 05/02/19 0132 05/03/19 0600  NA 142 140  K 4.0 3.8  CL 105 107  CO2 22 24  GLUCOSE 177* 114*  BUN 13 13  CREATININE 1.06 1.05  CALCIUM 10.4* 9.3   Liver Function Tests: Recent Labs  Lab 05/02/19 0132 05/03/19 0600  AST 20 20  ALT 18 15   ALKPHOS 78 60  BILITOT 1.0 1.5*  PROT 7.5 6.2*  ALBUMIN 4.5 3.7   Coagulation Profile: Recent Labs  Lab 05/03/19 0753  INR 1.2   HbA1C: No results for input(s): HGBA1C in the last 72 hours. CBG: Recent Labs  Lab 05/02/19 1203 05/02/19 1649 05/02/19 2012 05/03/19 0733  GLUCAP 132* 125* 108* 96    Recent Results (from the past 240 hour(s))  Respiratory Panel by RT PCR (Flu A&B, Covid) - Nasopharyngeal Swab     Status: None   Collection Time: 05/02/19  5:45 AM   Specimen: Nasopharyngeal Swab  Result Value Ref Range Status   SARS Coronavirus 2 by RT PCR NEGATIVE NEGATIVE Final    Comment: (NOTE) SARS-CoV-2 target nucleic acids are NOT DETECTED. The SARS-CoV-2 RNA is generally detectable in upper respiratoy specimens during the acute phase of infection. The lowest concentration of SARS-CoV-2 viral copies this assay can detect is 131 copies/mL. A negative result does not preclude SARS-Cov-2 infection and should not be used as the sole basis for treatment or other patient management decisions. A negative result may occur with  improper specimen collection/handling, submission of specimen other than nasopharyngeal swab, presence of viral mutation(s) within the areas targeted by this assay, and inadequate number of viral copies (<131 copies/mL). A negative result must be combined with clinical observations, patient history, and epidemiological information. The expected result is Negative. Fact Sheet for Patients:  PinkCheek.be Fact Sheet for Healthcare Providers:  GravelBags.it This test is not yet ap proved or cleared by the [place] FDA and  has been authorized for detection and/or diagnosis of SARS-CoV-2 by FDA under an Emergency Use Authorization (EUA). This EUA will remain  in effect (meaning this test can be used) for the duration of the COVID-19 declaration under Section 564(b)(1) of the Act, 21  U.S.C. section 360bbb-3(b)(1), unless the authorization is terminated or revoked sooner.    Influenza A by PCR NEGATIVE NEGATIVE Final   Influenza B by PCR NEGATIVE NEGATIVE Final    Comment: (NOTE) The Xpert Xpress SARS-CoV-2/FLU/RSV assay is intended as an aid in  the diagnosis of influenza from Nasopharyngeal swab specimens and  should not be used as a sole basis for treatment. Nasal washings and  aspirates are unacceptable for Xpert Xpress SARS-CoV-2/FLU/RSV  testing. Fact Sheet for Patients: PinkCheek.be Fact Sheet for Healthcare Providers: GravelBags.it This test is not yet approved or cleared by the [place] FDA and  has been authorized for detection and/or diagnosis of SARS-CoV-2 by  FDA under an Emergency Use Authorization (EUA). This EUA will remain  in effect (meaning this test can be used) for the duration of the  Covid-19 declaration under Section 564(b)(1) of the Act, 21  U.S.C. section 360bbb-3(b)(1), unless the authorization is  terminated or revoked. Performed at [facility], [place]  [address] [place], [place] [address]   MRSA PCR Screening     Status: None   Collection Time: 05/03/19  5:59 AM   Specimen: Nasal Mucosa; Nasopharyngeal  Result Value Ref Range Status   MRSA by PCR NEGATIVE NEGATIVE Final    Comment:        The GeneXpert MRSA Assay (FDA approved for NASAL specimens only), is one component of a comprehensive MRSA colonization surveillance program. It is not intended to diagnose MRSA infection nor to guide or monitor treatment for MRSA infections. Performed at [facility], [place] [address], [place], [place] [address]      Radiology Studies: DG Abd Portable 1V  Result Date: 05/02/2019 CLINICAL DATA:  8 hour delay film for possible small bowel obstruction EXAM: PORTABLE ABDOMEN - 1 VIEW COMPARISON:  Film from earlier in the same day. FINDINGS:  Scattered large and small bowel gas is noted. The previously seen small bowel dilatation has resolved in the interval. Contrast material administered previously now lies within the colon predominately within the rectum. Diverticular change of the colon is seen. No free air is identified. Degenerative changes of lumbar spine are seen. IMPRESSION: Resolution of previously seen small bowel dilatation. This is consistent with the given clinical history of reduction of the left inguinal hernia. Contrast material administered now lies within the colon primarily distally. Electronically Signed   By: Inez Catalina M.D.   On: 05/02/2019 19:53    Marzetta Board, MD, PhD [facility]  Between 7 am - 7 pm I am available, please contact me via Amion or Securechat  Between 7 pm - 7 am I am not available, please contact night coverage MD/APP via Amion

## 2019-05-03 NOTE — Anesthesia Preprocedure Evaluation (Addendum)
Anesthesia Evaluation  Patient identified by MRN, date of birth, ID band Patient awake    Reviewed: Allergy & Precautions, NPO status , Patient's Chart, lab work & pertinent test results  Airway Mallampati: II  TM Distance: >3 FB Neck ROM: Full    Dental no notable dental hx. (+) Caps, Dental Advisory Given,    Pulmonary neg pulmonary ROS,    Pulmonary exam normal breath sounds clear to auscultation       Cardiovascular hypertension, + Peripheral Vascular Disease  Normal cardiovascular exam+ dysrhythmias Atrial Fibrillation  Rhythm:Regular Rate:Normal     Neuro/Psych negative psych ROS   GI/Hepatic GERD  ,  Endo/Other  diabetesHypothyroidism   Renal/GU Renal InsufficiencyRenal diseaseK+ 3.8 Cr 1.05     Musculoskeletal   Abdominal   Peds  Hematology Hgb 15.1 Plt 225   Anesthesia Other Findings   Reproductive/Obstetrics                            Anesthesia Physical Anesthesia Plan  ASA: III  Anesthesia Plan: General   Post-op Pain Management:    Induction: Intravenous  PONV Risk Score and Plan: 3 and Treatment may vary due to age or medical condition and Ondansetron  Airway Management Planned: Oral ETT  Additional Equipment: None  Intra-op Plan:   Post-operative Plan: Extubation in OR  Informed Consent: I have reviewed the patients History and Physical, chart, labs and discussed the procedure including the risks, benefits and alternatives for the proposed anesthesia with the patient or authorized representative who has indicated his/her understanding and acceptance.     Dental advisory given  Plan Discussed with: CRNA  Anesthesia Plan Comments:         Anesthesia Quick Evaluation

## 2019-05-03 NOTE — Discharge Instructions (Signed)
[facility]  HERNIA REPAIR POST OP INSTRUCTIONS  Always review your discharge instruction sheet given to you by the facility where your surgery was performed.  1. A  prescription for pain medication may be given to you upon discharge.  Take your pain medication as prescribed.  If narcotic pain medicine is not needed, then you may take acetaminophen (Tylenol) or ibuprofen (Advil) as needed.  2. Take your usually prescribed medications unless otherwise directed.  3. If you need a refill on your pain medication, please contact your pharmacy.  They will contact our office to request authorization. Prescriptions will not be filled after 5 pm daily or on weekends.  4. You should follow a light diet the first 24 hours after arrival home, such as soup and crackers or toast.  Be sure to include plenty of fluids daily.  Resume your normal diet the day after surgery.  5. Most patients will experience some swelling and bruising around the surgical site.  Ice packs and reclining will help.  Swelling and bruising can take several days to resolve.   6. It is common to experience some constipation if taking pain medication after surgery.  Increasing fluid intake and taking a stool softener (such as Colace) will usually help or prevent this problem from occurring.  A mild laxative (Milk of Magnesia or Miralax) should be taken according to package directions if there are no bowel movements after 48 hours.  7. You will likely have Dermabond (topical glue) over your incisions.  This seals the incisions and allows you to bathe and shower at any time after your surgery.  Glue should remain in place for up to 10 days.  It may be removed after 10 days by pealing off the Dermabond material or using Vaseline or naval jelly to remove.  8. If you have steri-strips over your incisions, you may remove the gauze bandage on the second day after surgery, and you may shower at that time.  Leave your steri-strips  (small skin tapes) in place directly over the incision.  These strips should remain on the skin for 5-7 days and then be removed.  You may get them wet in the shower and pat them dry.  9. ACTIVITIES:  You may resume regular (light) daily activities beginning the next day - such as daily self-care, walking, climbing stairs - gradually increasing activities as tolerated.  You may have sexual intercourse when it is comfortable.  Refrain from any heavy lifting or straining until approved by your doctor.  You may drive when you are no longer taking prescription pain medication, when you can comfortably wear a seatbelt, and when you can safely maneuver your car and apply brakes.  10. You should see your doctor in the office for a follow-up appointment approximately 2-3 weeks after your surgery.  Make sure that you call for this appointment within a day or two after you arrive home to insure a convenient appointment time.  WHEN TO CALL YOUR DOCTOR: 1. Fever greater than 101.0 2. Inability to urinate 3. Persistent nausea and/or vomiting 4. Extreme swelling or bruising 5. Continued bleeding from incision 6. Increased pain, redness, or drainage from the incision  The clinic staff is available to answer your questions during regular business hours.  Please don't hesitate to call and ask to speak to one of the nurses for clinical concerns.  If you have a medical emergency, go to the nearest emergency room or call 911.  A surgeon from [employer] [facility] is always on call for the hospital.   [facility] [address], [place], [place], [place]  [address]  [phone] ? [phone] ? FAX [phone]  [email or web address]

## 2019-05-04 DIAGNOSIS — E785 Hyperlipidemia, unspecified: Secondary | ICD-10-CM

## 2019-05-04 DIAGNOSIS — R739 Hyperglycemia, unspecified: Secondary | ICD-10-CM

## 2019-05-04 LAB — COMPREHENSIVE METABOLIC PANEL
ALT: 13 U/L (ref 0–44)
AST: 17 U/L (ref 15–41)
Albumin: 3.1 g/dL — ABNORMAL LOW (ref 3.5–5.0)
Alkaline Phosphatase: 54 U/L (ref 38–126)
Anion gap: 6 (ref 5–15)
BUN: 13 mg/dL (ref 8–23)
CO2: 26 mmol/L (ref 22–32)
Calcium: 8.9 mg/dL (ref 8.9–10.3)
Chloride: 106 mmol/L (ref 98–111)
Creatinine, Ser: 1.02 mg/dL (ref 0.61–1.24)
GFR calc Af Amer: >60 mL/min (ref >60)
GFR calc non Af Amer: >60 mL/min (ref >60)
Glucose, Bld: 108 mg/dL — ABNORMAL HIGH (ref 70–99)
Potassium: 3.9 mmol/L (ref 3.5–5.1)
Sodium: 138 mmol/L (ref 135–145)
Total Bilirubin: 1 mg/dL (ref 0.3–1.2)
Total Protein: 5.3 g/dL — ABNORMAL LOW (ref 6.5–8.1)

## 2019-05-04 LAB — CBC
HCT: 40.4 % (ref 39.0–52.0)
Hemoglobin: 13.1 g/dL (ref 13.0–17.0)
MCH: 32 pg (ref 26.0–34.0)
MCHC: 32.4 g/dL (ref 30.0–36.0)
MCV: 98.8 fL (ref 80.0–100.0)
Platelets: 193 10*3/uL (ref 150–400)
RBC: 4.09 MIL/uL — ABNORMAL LOW (ref 4.22–5.81)
RDW: 12.9 % (ref 11.5–15.5)
WBC: 9.1 10*3/uL (ref 4.0–10.5)
nRBC: 0 % (ref 0.0–0.2)

## 2019-05-04 LAB — GLUCOSE, CAPILLARY: Glucose-Capillary: 95 mg/dL (ref 70–99)

## 2019-05-04 NOTE — Anesthesia Postprocedure Evaluation (Signed)
Anesthesia Post Note  Patient: Tom Norton  Procedure(s) Performed: HERNIA REPAIR INGUINAL ADULT WITH MESH (Left Abdomen)     Patient location during evaluation: PACU Anesthesia Type: General Level of consciousness: awake and alert Pain management: pain level controlled Vital Signs Assessment: post-procedure vital signs reviewed and stable Respiratory status: spontaneous breathing, nonlabored ventilation, respiratory function stable and patient connected to nasal cannula oxygen Cardiovascular status: blood pressure returned to baseline and stable Postop Assessment: no apparent nausea or vomiting Anesthetic complications: no    Last Vitals:  Vitals:   05/03/19 2059 05/04/19 0555  BP: 104/75 112/69  Pulse: 72 60  Resp: 15 16  Temp: 36.9 C 36.5 C  SpO2: 93% 94%    Last Pain:  Vitals:   05/04/19 1100  TempSrc:   PainSc: 0-No pain                 Barnet Glasgow

## 2019-05-04 NOTE — Progress Notes (Signed)
Assessment & Plan: POD#1 - status post open repair recurrent LIH with mesh  Advancing to regular diet this AM  May resume Eliquis today  Pain control is good - Exparel utilized at surgery  OK for discharge home from surgical standpoint.  Medical service to evaluate and manage.  Will arrange follow up at [place] office with me in 2-3 weeks.  Will sign off - call if needed.        Tom Gemma, MD       [facility]       Office: [phone]   Chief Complaint: SBO due to recurrent LIH  Subjective: Patient comfortable this AM, nurse in room.  Ordered regular breakfast.  Objective: Vital signs in last 24 hours: Temp:  [97.7 F (36.5 C)-98.4 F (36.9 C)] 97.7 F (36.5 C) (01/10 0555) Pulse Rate:  [60-85] 60 (01/10 0555) Resp:  [12-22] 16 (01/10 0555) BP: (104-129)/(69-82) 112/69 (01/10 0555) SpO2:  [90 %-97 %] 94 % (01/10 0555) Last BM Date: 05/02/19  Intake/Output from previous day: 01/09 0701 - 01/10 0700 In: 1965.6 [I.V.:1765.6; IV Piggyback:200] Out: 1010 [Urine:1000; Blood:10] Intake/Output this shift: Total I/O In: -  Out: 200 [Urine:200]  Physical Exam: HEENT - sclerae clear, mucous membranes moist Abdomen - soft without distension; surgical wound dry and intact; minimal tenderness Ext - no edema, non-tender Neuro - alert & oriented, no focal deficits  Lab Results:  Recent Labs    05/03/19 0600 05/04/19 0551  WBC 8.7 9.1  HGB 15.1 13.1  HCT 46.9 40.4  PLT 225 193   BMET Recent Labs    05/03/19 0600 05/04/19 0551  NA 140 138  K 3.8 3.9  CL 107 106  CO2 24 26  GLUCOSE 114* 108*  BUN 13 13  CREATININE 1.05 1.02  CALCIUM 9.3 8.9   PT/INR Recent Labs    05/03/19 0753  LABPROT 14.7  INR 1.2   Comprehensive Metabolic Panel:    Component Value Date/Time   NA 138 05/04/2019 0551   NA 140 05/03/2019 0600   K 3.9 05/04/2019 0551   K 3.8 05/03/2019 0600   CL 106 05/04/2019 0551   CL 107 05/03/2019 0600   CO2 26  05/04/2019 0551   CO2 24 05/03/2019 0600   BUN 13 05/04/2019 0551   BUN 13 05/03/2019 0600   CREATININE 1.02 05/04/2019 0551   CREATININE 1.05 05/03/2019 0600   GLUCOSE 108 (H) 05/04/2019 0551   GLUCOSE 114 (H) 05/03/2019 0600   CALCIUM 8.9 05/04/2019 0551   CALCIUM 9.3 05/03/2019 0600   AST 17 05/04/2019 0551   AST 20 05/03/2019 0600   ALT 13 05/04/2019 0551   ALT 15 05/03/2019 0600   ALKPHOS 54 05/04/2019 0551   ALKPHOS 60 05/03/2019 0600   BILITOT 1.0 05/04/2019 0551   BILITOT 1.5 (H) 05/03/2019 0600   PROT 5.3 (L) 05/04/2019 0551   PROT 6.2 (L) 05/03/2019 0600   PROT 6.5 07/17/2017 1416   ALBUMIN 3.1 (L) 05/04/2019 0551   ALBUMIN 3.7 05/03/2019 0600    Studies/Results: DG Abd Portable 1V  Result Date: 05/02/2019 CLINICAL DATA:  8 hour delay film for possible small bowel obstruction EXAM: PORTABLE ABDOMEN - 1 VIEW COMPARISON:  Film from earlier in the same day. FINDINGS: Scattered large and small bowel gas is noted. The previously seen small bowel dilatation has resolved in the interval. Contrast material administered previously now lies within the colon predominately within the rectum. Diverticular change of the  colon is seen. No free air is identified. Degenerative changes of lumbar spine are seen. IMPRESSION: Resolution of previously seen small bowel dilatation. This is consistent with the given clinical history of reduction of the left inguinal hernia. Contrast material administered now lies within the colon primarily distally. Electronically Signed   By: Inez Catalina M.D.   On: 05/02/2019 19:53   DG Abd Portable 1V-Small Bowel Obstruction Protocol-initial, 8 hr delay  Result Date: 05/02/2019 CLINICAL DATA:  Progressive abdominal pain with nausea and vomiting EXAM: PORTABLE ABDOMEN - 1 VIEW COMPARISON:  CT abdomen and pelvis May 02, 2019 obtained earlier in the day FINDINGS: Multiple loops of bowel remain dilated with borderline thickening of multiple areas of bowel wall  consistent with the bowel obstruction noted on recent CT. No free air evident. Contrast is seen in the collecting systems, ureters, and urinary bladder. IMPRESSION: Persistent bowel dilatation with borderline bowel wall thickening at multiple sites. Findings indicative of bowel obstruction. No free air appreciable on supine examination. Electronically Signed   By: Lowella Grip III M.D.   On: 05/02/2019 08:52      Tom Norton 05/04/2019  Patient ID: Tom Norton, male   DOB: 06-Jun-1929, 86 y.o.   MRN: [id number]

## 2019-05-04 NOTE — Discharge Summary (Signed)
Physician Discharge Summary  Tom Norton [id number] DOB: 1929/07/22 DOA: 05/02/2019  PCP: Josetta Huddle, MD  Admit date: 05/02/2019 Discharge date: 05/04/2019  Admitted From: home Disposition:  home  Recommendations for Outpatient Follow-up:  1. Follow up with PCP in 1-2 weeks 2. Follow up with Dr. Marykay Lex as scheduled   Home Health: none Equipment/Devices: none   Discharge Condition: stable CODE STATUS: Full code Diet recommendation: regular  HPI: Per admitting MD, Tom Norton is a 86 y.o. male with medical history significant of afib on Eliquis; hypothyroidism; HTN; and HLD presenting with abdominal pain.  He reports that he had a remote hernia repair.  He picked up some heavy rock bags from [place] for his driveway and thinks that brought on the problem a few days later, occurring several weeks ago.  He noticed significant pain in his left groin with swelling.   +N/V starting yesterday, worst in the evening.  No fever.  He takes Eliquis and his last dose was yesterday AM (1/7 AM). ED Course:  Carryover, per Dr. Hal Hope: 86 year old male with history of A. fib on Eliquis presents with abdominal pain has obstructed the hernia with bowel obstruction for which general surgery has been consulted patient is receiving reversal agent for his apixaban. Patient is likely going for surgery today. Covid test is pending.  Hospital Course / Discharge diagnoses: Principal Problem Small bowel obstruction associated with recurrent left inguinal hernia -general surgery was consulted and followed patient while hospitalized. His hernia was initially manually reduced, resolving the small bowel obstruction. He was eventually taken to the OR on 05/03/19 and underwent repair. Patient recovered well post op, able to ambulate, pain is controlled, is tolerating food and will be discharged home in stable condition with outpatient folow up with Dr. Harlow Asa.   Active Problems Paroxysmal A. fib on  Eliquis -resume home medications BPH -On tamsulosin Hypothyroidism -Continue Synthroid, TSH normal 2.6  Discharge Instructions   Allergies as of 05/04/2019      Reactions   Doxycycline Hyclate Anaphylaxis   Other Anaphylaxis   Bee stings      Medication List    TAKE these medications   CINNAMON PO Take 1,000 mg by mouth daily.   Co Q10 100 MG Caps Take 100 mg by mouth daily.   diphenhydrAMINE 25 mg capsule Commonly known as: BENADRYL Take 50 mg by mouth daily as needed for allergies.   Eliquis 5 MG Tabs tablet Generic drug: apixaban TAKE 1 TABLET BY MOUTH 2 TIMES DAILY.   EPINEPHrine 0.3 mg/0.3 mL Soaj injection Commonly known as: EPI-PEN Inject 0.3 mg into the muscle daily as needed (allergic reaction).   eszopiclone 1 MG Tabs tablet Commonly known as: LUNESTA Take 1-2 mg by mouth at bedtime. Take immediately before bedtime   levothyroxine 25 MCG tablet Commonly known as: SYNTHROID Take 25 mcg by mouth daily before breakfast.   multivitamin with minerals Tabs tablet Take 1 tablet by mouth daily.   tamsulosin 0.4 MG Caps capsule Commonly known as: FLOMAX Take 0.4 mg by mouth daily after supper.   traMADol 50 MG tablet Commonly known as: ULTRAM Take 1-2 tablets (50-100 mg total) by mouth every 6 (six) hours as needed.   UNABLE TO FIND Take 1 tablet by mouth every evening. Med Name: Protandim   Vitamin D3 50 MCG (2000 UT) Tabs Take 2,000 Units by mouth daily.      Follow-up Information    Armandina Gemma, MD. Schedule an appointment as soon as possible  for a visit in 3 weeks.   Specialty: General Surgery Why: For wound re-check Contact information: [address] [place] [address] [phone]        Armandina Gemma, MD. Schedule an appointment as soon as possible for a visit in 3 weeks.   Specialty: General Surgery Why: For wound re-check Contact information: [place] [address] [phone]            Consultations:  General surgery   Procedures/Studies:  Hernia repair 1/9 Dr Harlow Asa  CT ABDOMEN PELVIS W CONTRAST  Result Date: 05/02/2019 CLINICAL DATA:  Bowel obstruction suspected. Left lower quadrant pain for 2 weeks EXAM: CT ABDOMEN AND PELVIS WITH CONTRAST TECHNIQUE: Multidetector CT imaging of the abdomen and pelvis was performed using the standard protocol following bolus administration of intravenous contrast. CONTRAST:  166mL OMNIPAQUE IOHEXOL 300 MG/ML  SOLN COMPARISON:  None. FINDINGS: Lower chest:  Coronary atherosclerosis.  Lung bases are clear. Hepatobiliary: No focal liver abnormality.No evidence of biliary obstruction or stone. Pancreas: Unremarkable. Spleen: Unremarkable. Adrenals/Urinary Tract: Negative adrenals. No hydronephrosis or ureteral stone. There are 2 left renal calculi measuring up to 1 cm at the lower pole. Left renal cortical and hilar cysts. [place] calculus in the bladder measuring 2 cm. Mild bladder wall thickening and lobulation likely from small cellules. Stomach/Bowel: Dilated bowel above a bowel containing left inguinal hernia. No evident perforation or bowel necrosis. There is extensive left colonic diverticulosis. Vascular/Lymphatic: No acute vascular abnormality. Atherosclerotic calcification. No mass or adenopathy. Reproductive:Enlarged prostate. Other: Left inguinal hernia as noted above. Small fatty umbilical hernia. Trace pelvic fluid considered reactive. Musculoskeletal: Advanced spinal degeneration with lumbar levoscoliosis. No acute or aggressive finding IMPRESSION: 1. High-grade small bowel obstruction due to left inguinal hernia. 2. Left renal and bladder calculi. Electronically Signed   By: Monte Fantasia M.D.   On: 05/02/2019 05:13   DG Abd Portable 1V  Result Date: 05/02/2019 CLINICAL DATA:  8 hour delay film for possible small bowel obstruction EXAM: PORTABLE ABDOMEN - 1 VIEW COMPARISON:  Film from earlier in the same day. FINDINGS: Scattered  large and small bowel gas is noted. The previously seen small bowel dilatation has resolved in the interval. Contrast material administered previously now lies within the colon predominately within the rectum. Diverticular change of the colon is seen. No free air is identified. Degenerative changes of lumbar spine are seen. IMPRESSION: Resolution of previously seen small bowel dilatation. This is consistent with the given clinical history of reduction of the left inguinal hernia. Contrast material administered now lies within the colon primarily distally. Electronically Signed   By: Inez Catalina M.D.   On: 05/02/2019 19:53   DG Abd Portable 1V-Small Bowel Obstruction Protocol-initial, 8 hr delay  Result Date: 05/02/2019 CLINICAL DATA:  Progressive abdominal pain with nausea and vomiting EXAM: PORTABLE ABDOMEN - 1 VIEW COMPARISON:  CT abdomen and pelvis May 02, 2019 obtained earlier in the day FINDINGS: Multiple loops of bowel remain dilated with borderline thickening of multiple areas of bowel wall consistent with the bowel obstruction noted on recent CT. No free air evident. Contrast is seen in the collecting systems, ureters, and urinary bladder. IMPRESSION: Persistent bowel dilatation with borderline bowel wall thickening at multiple sites. Findings indicative of bowel obstruction. No free air appreciable on supine examination. Electronically Signed   By: Lowella Grip III M.D.   On: 05/02/2019 08:52      Subjective: - no chest pain, shortness of  breath, no abdominal pain, nausea or vomiting.   Discharge Exam: BP 112/69 (BP Location: Right Arm)   Pulse 60   Temp 97.7 F (36.5 C) (Oral)   Resp 16   Ht 6' (1.829 m)   Wt 94.3 kg   SpO2 94%   BMI 28.21 kg/m   General: Pt is alert, awake, not in acute distress Cardiovascular: RRR, S1/S2 +, no rubs, no gallops Respiratory: CTA bilaterally, no wheezing, no rhonchi Abdominal: Soft, NT, ND, bowel sounds + Extremities: no edema, no  cyanosis    The results of significant diagnostics from this hospitalization (including imaging, microbiology, ancillary and laboratory) are listed below for reference.     Microbiology: Recent Results (from the past 240 hour(s))  Respiratory Panel by RT PCR (Flu A&B, Covid) - Nasopharyngeal Swab     Status: None   Collection Time: 05/02/19  5:45 AM   Specimen: Nasopharyngeal Swab  Result Value Ref Range Status   SARS Coronavirus 2 by RT PCR NEGATIVE NEGATIVE Final    Comment: (NOTE) SARS-CoV-2 target nucleic acids are NOT DETECTED. The SARS-CoV-2 RNA is generally detectable in upper respiratoy specimens during the acute phase of infection. The lowest concentration of SARS-CoV-2 viral copies this assay can detect is 131 copies/mL. A negative result does not preclude SARS-Cov-2 infection and should not be used as the sole basis for treatment or other patient management decisions. A negative result may occur with  improper specimen collection/handling, submission of specimen other than nasopharyngeal swab, presence of viral mutation(s) within the areas targeted by this assay, and inadequate number of viral copies (<131 copies/mL). A negative result must be combined with clinical observations, patient history, and epidemiological information. The expected result is Negative. Fact Sheet for Patients:  PinkCheek.be Fact Sheet for Healthcare Providers:  GravelBags.it This test is not yet ap proved or cleared by the [place] FDA and  has been authorized for detection and/or diagnosis of SARS-CoV-2 by FDA under an Emergency Use Authorization (EUA). This EUA will remain  in effect (meaning this test can be used) for the duration of the COVID-19 declaration under Section 564(b)(1) of the Act, 21 U.S.C. section 360bbb-3(b)(1), unless the authorization is terminated or revoked sooner.    Influenza A by PCR NEGATIVE NEGATIVE  Final   Influenza B by PCR NEGATIVE NEGATIVE Final    Comment: (NOTE) The Xpert Xpress SARS-CoV-2/FLU/RSV assay is intended as an aid in  the diagnosis of influenza from Nasopharyngeal swab specimens and  should not be used as a sole basis for treatment. Nasal washings and  aspirates are unacceptable for Xpert Xpress SARS-CoV-2/FLU/RSV  testing. Fact Sheet for Patients: PinkCheek.be Fact Sheet for Healthcare Providers: GravelBags.it This test is not yet approved or cleared by the [place] FDA and  has been authorized for detection and/or diagnosis of SARS-CoV-2 by  FDA under an Emergency Use Authorization (EUA). This EUA will remain  in effect (meaning this test can be used) for the duration of the  Covid-19 declaration under Section 564(b)(1) of the Act, 21  U.S.C. section 360bbb-3(b)(1), unless the authorization is  terminated or revoked. Performed at [facility], [place] [address] [place], [place] [address]   MRSA PCR Screening     Status: None   Collection Time: 05/03/19  5:59 AM   Specimen: Nasal Mucosa; Nasopharyngeal  Result Value Ref Range Status   MRSA by PCR NEGATIVE NEGATIVE Final    Comment:        The GeneXpert MRSA Assay (  FDA approved for NASAL specimens only), is one component of a comprehensive MRSA colonization surveillance program. It is not intended to diagnose MRSA infection nor to guide or monitor treatment for MRSA infections. Performed at [facility], [place] [address] [place], [place] [address]      Labs: Basic Metabolic Panel: Recent Labs  Lab 05/02/19 0132 05/03/19 0600 05/04/19 0551  NA 142 140 138  K 4.0 3.8 3.9  CL 105 107 106  CO2 22 24 26   GLUCOSE 177* 114* 108*  BUN 13 13 13   CREATININE 1.06 1.05 1.02  CALCIUM 10.4* 9.3 8.9   Liver Function Tests: Recent Labs  Lab 05/02/19 0132 05/03/19 0600 05/04/19 0551  AST 20 20 17   ALT  18 15 13   ALKPHOS 78 60 54  BILITOT 1.0 1.5* 1.0  PROT 7.5 6.2* 5.3*  ALBUMIN 4.5 3.7 3.1*   CBC: Recent Labs  Lab 05/02/19 0132 05/03/19 0600 05/04/19 0551  WBC 15.9* 8.7 9.1  HGB 17.2* 15.1 13.1  HCT 51.6 46.9 40.4  MCV 94.7 98.3 98.8  PLT 267 225 193   CBG: Recent Labs  Lab 05/03/19 0733 05/03/19 1227 05/03/19 1621 05/03/19 2102 05/04/19 0820  GLUCAP 96 112* 154* 136* 95   Hgb A1c No results for input(s): HGBA1C in the last 72 hours. Lipid Profile No results for input(s): CHOL, HDL, LDLCALC, TRIG, CHOLHDL, LDLDIRECT in the last 72 hours. Thyroid function studies Recent Labs    05/02/19 1252  TSH 2.642   Urinalysis    Component Value Date/Time   COLORURINE AMBER (A) 05/02/2019 0428   APPEARANCEUR HAZY (A) 05/02/2019 0428   LABSPEC 1.020 05/02/2019 0428   PHURINE 5.0 05/02/2019 0428   GLUCOSEU NEGATIVE 05/02/2019 0428   HGBUR NEGATIVE 05/02/2019 0428   BILIRUBINUR NEGATIVE 05/02/2019 0428   KETONESUR 20 (A) 05/02/2019 0428   PROTEINUR 30 (A) 05/02/2019 0428   NITRITE NEGATIVE 05/02/2019 0428   LEUKOCYTESUR NEGATIVE 05/02/2019 0428    FURTHER DISCHARGE INSTRUCTIONS:   Get Medicines reviewed and adjusted: Please take all your medications with you for your next visit with your Primary MD   Laboratory/radiological data: Please request your Primary MD to go over all hospital tests and procedure/radiological results at the follow up, please ask your Primary MD to get all Hospital records sent to his/her office.   In some cases, they will be blood work, cultures and biopsy results pending at the time of your discharge. Please request that your primary care M.D. goes through all the records of your hospital data and follows up on these results.   Also Note the following: If you experience worsening of your admission symptoms, develop shortness of breath, life threatening emergency, suicidal or homicidal thoughts you must seek medical attention immediately by  calling 911 or calling your MD immediately  if symptoms less severe.   You must read complete instructions/literature along with all the possible adverse reactions/side effects for all the Medicines you take and that have been prescribed to you. Take any new Medicines after you have completely understood and accpet all the possible adverse reactions/side effects.    Do not drive when taking Pain medications or sleeping medications (Benzodaizepines)   Do not take more than prescribed Pain, Sleep and Anxiety Medications. It is not advisable to combine anxiety,sleep and pain medications without talking with your primary care practitioner   Special Instructions: If you have smoked or chewed Tobacco  in the last 2 yrs please stop smoking, stop any regular  Alcohol  and or any Recreational drug use.   Wear Seat belts while driving.   Please note: You were cared for by a hospitalist during your hospital stay. Once you are discharged, your primary care physician will handle any further medical issues. Please note that NO REFILLS for any discharge medications will be authorized once you are discharged, as it is imperative that you return to your primary care physician (or establish a relationship with a primary care physician if you do not have one) for your post hospital discharge needs so that they can reassess your need for medications and monitor your lab values.  Time coordinating discharge: 35 minutes  SIGNED:  Marzetta Board, MD, PhD 05/04/2019, 10:43 AM

## 2019-05-05 ENCOUNTER — Encounter: Payer: Self-pay | Admitting: *Deleted

## 2019-05-20 DIAGNOSIS — K4090 Unilateral inguinal hernia, without obstruction or gangrene, not specified as recurrent: Secondary | ICD-10-CM | POA: Diagnosis not present

## 2019-05-20 DIAGNOSIS — R202 Paresthesia of skin: Secondary | ICD-10-CM | POA: Diagnosis not present

## 2019-05-20 DIAGNOSIS — H269 Unspecified cataract: Secondary | ICD-10-CM | POA: Diagnosis not present

## 2019-05-23 DIAGNOSIS — I10 Essential (primary) hypertension: Secondary | ICD-10-CM | POA: Diagnosis not present

## 2019-05-23 DIAGNOSIS — I4891 Unspecified atrial fibrillation: Secondary | ICD-10-CM | POA: Diagnosis not present

## 2019-05-23 DIAGNOSIS — H269 Unspecified cataract: Secondary | ICD-10-CM | POA: Diagnosis not present

## 2019-05-23 DIAGNOSIS — N400 Enlarged prostate without lower urinary tract symptoms: Secondary | ICD-10-CM | POA: Diagnosis not present

## 2019-05-23 DIAGNOSIS — E785 Hyperlipidemia, unspecified: Secondary | ICD-10-CM | POA: Diagnosis not present

## 2019-05-23 DIAGNOSIS — E039 Hypothyroidism, unspecified: Secondary | ICD-10-CM | POA: Diagnosis not present

## 2019-05-23 DIAGNOSIS — I480 Paroxysmal atrial fibrillation: Secondary | ICD-10-CM | POA: Diagnosis not present

## 2019-05-23 DIAGNOSIS — M170 Bilateral primary osteoarthritis of knee: Secondary | ICD-10-CM | POA: Diagnosis not present

## 2019-05-23 DIAGNOSIS — M179 Osteoarthritis of knee, unspecified: Secondary | ICD-10-CM | POA: Diagnosis not present

## 2019-05-23 DIAGNOSIS — E782 Mixed hyperlipidemia: Secondary | ICD-10-CM | POA: Diagnosis not present

## 2019-05-26 DIAGNOSIS — K4090 Unilateral inguinal hernia, without obstruction or gangrene, not specified as recurrent: Secondary | ICD-10-CM | POA: Diagnosis not present

## 2019-05-26 DIAGNOSIS — I10 Essential (primary) hypertension: Secondary | ICD-10-CM | POA: Diagnosis not present

## 2019-05-26 DIAGNOSIS — E039 Hypothyroidism, unspecified: Secondary | ICD-10-CM | POA: Diagnosis not present

## 2019-05-26 DIAGNOSIS — H269 Unspecified cataract: Secondary | ICD-10-CM | POA: Diagnosis not present

## 2019-05-26 DIAGNOSIS — E785 Hyperlipidemia, unspecified: Secondary | ICD-10-CM | POA: Diagnosis not present

## 2019-05-26 DIAGNOSIS — R202 Paresthesia of skin: Secondary | ICD-10-CM | POA: Diagnosis not present

## 2019-05-26 DIAGNOSIS — I4891 Unspecified atrial fibrillation: Secondary | ICD-10-CM | POA: Diagnosis not present

## 2019-06-06 ENCOUNTER — Other Ambulatory Visit: Payer: Self-pay | Admitting: Cardiovascular Disease

## 2019-06-06 NOTE — Telephone Encounter (Signed)
Refill request for Eliquis

## 2019-06-07 NOTE — Telephone Encounter (Signed)
89 M 95.9 kg, SCr 1.02 (1/21), LOV Cro[place]u 10/20

## 2019-06-11 DIAGNOSIS — Z85828 Personal history of other malignant neoplasm of skin: Secondary | ICD-10-CM | POA: Diagnosis not present

## 2019-06-11 DIAGNOSIS — C44622 Squamous cell carcinoma of skin of right upper limb, including shoulder: Secondary | ICD-10-CM | POA: Diagnosis not present

## 2019-06-11 DIAGNOSIS — D485 Neoplasm of uncertain behavior of skin: Secondary | ICD-10-CM | POA: Diagnosis not present

## 2019-06-13 DIAGNOSIS — H269 Unspecified cataract: Secondary | ICD-10-CM | POA: Diagnosis not present

## 2019-06-13 DIAGNOSIS — M170 Bilateral primary osteoarthritis of knee: Secondary | ICD-10-CM | POA: Diagnosis not present

## 2019-06-13 DIAGNOSIS — M179 Osteoarthritis of knee, unspecified: Secondary | ICD-10-CM | POA: Diagnosis not present

## 2019-06-13 DIAGNOSIS — I10 Essential (primary) hypertension: Secondary | ICD-10-CM | POA: Diagnosis not present

## 2019-06-13 DIAGNOSIS — E039 Hypothyroidism, unspecified: Secondary | ICD-10-CM | POA: Diagnosis not present

## 2019-06-13 DIAGNOSIS — E785 Hyperlipidemia, unspecified: Secondary | ICD-10-CM | POA: Diagnosis not present

## 2019-06-13 DIAGNOSIS — I4891 Unspecified atrial fibrillation: Secondary | ICD-10-CM | POA: Diagnosis not present

## 2019-06-13 DIAGNOSIS — I480 Paroxysmal atrial fibrillation: Secondary | ICD-10-CM | POA: Diagnosis not present

## 2019-06-13 DIAGNOSIS — E782 Mixed hyperlipidemia: Secondary | ICD-10-CM | POA: Diagnosis not present

## 2019-06-13 DIAGNOSIS — N400 Enlarged prostate without lower urinary tract symptoms: Secondary | ICD-10-CM | POA: Diagnosis not present

## 2019-07-08 DIAGNOSIS — M179 Osteoarthritis of knee, unspecified: Secondary | ICD-10-CM | POA: Diagnosis not present

## 2019-07-08 DIAGNOSIS — I4891 Unspecified atrial fibrillation: Secondary | ICD-10-CM | POA: Diagnosis not present

## 2019-07-08 DIAGNOSIS — I10 Essential (primary) hypertension: Secondary | ICD-10-CM | POA: Diagnosis not present

## 2019-07-08 DIAGNOSIS — I480 Paroxysmal atrial fibrillation: Secondary | ICD-10-CM | POA: Diagnosis not present

## 2019-07-08 DIAGNOSIS — E782 Mixed hyperlipidemia: Secondary | ICD-10-CM | POA: Diagnosis not present

## 2019-07-08 DIAGNOSIS — E039 Hypothyroidism, unspecified: Secondary | ICD-10-CM | POA: Diagnosis not present

## 2019-07-08 DIAGNOSIS — N400 Enlarged prostate without lower urinary tract symptoms: Secondary | ICD-10-CM | POA: Diagnosis not present

## 2019-07-08 DIAGNOSIS — M170 Bilateral primary osteoarthritis of knee: Secondary | ICD-10-CM | POA: Diagnosis not present

## 2019-07-08 DIAGNOSIS — H269 Unspecified cataract: Secondary | ICD-10-CM | POA: Diagnosis not present

## 2019-07-08 DIAGNOSIS — E785 Hyperlipidemia, unspecified: Secondary | ICD-10-CM | POA: Diagnosis not present

## 2019-07-18 DIAGNOSIS — H2513 Age-related nuclear cataract, bilateral: Secondary | ICD-10-CM | POA: Diagnosis not present

## 2019-08-05 DIAGNOSIS — H25813 Combined forms of age-related cataract, bilateral: Secondary | ICD-10-CM | POA: Diagnosis not present

## 2019-08-21 DIAGNOSIS — M170 Bilateral primary osteoarthritis of knee: Secondary | ICD-10-CM | POA: Diagnosis not present

## 2019-08-21 DIAGNOSIS — H269 Unspecified cataract: Secondary | ICD-10-CM | POA: Diagnosis not present

## 2019-08-21 DIAGNOSIS — I480 Paroxysmal atrial fibrillation: Secondary | ICD-10-CM | POA: Diagnosis not present

## 2019-08-21 DIAGNOSIS — E782 Mixed hyperlipidemia: Secondary | ICD-10-CM | POA: Diagnosis not present

## 2019-08-21 DIAGNOSIS — N400 Enlarged prostate without lower urinary tract symptoms: Secondary | ICD-10-CM | POA: Diagnosis not present

## 2019-08-21 DIAGNOSIS — E785 Hyperlipidemia, unspecified: Secondary | ICD-10-CM | POA: Diagnosis not present

## 2019-08-21 DIAGNOSIS — M179 Osteoarthritis of knee, unspecified: Secondary | ICD-10-CM | POA: Diagnosis not present

## 2019-08-21 DIAGNOSIS — E039 Hypothyroidism, unspecified: Secondary | ICD-10-CM | POA: Diagnosis not present

## 2019-08-21 DIAGNOSIS — I4891 Unspecified atrial fibrillation: Secondary | ICD-10-CM | POA: Diagnosis not present

## 2019-08-21 DIAGNOSIS — I10 Essential (primary) hypertension: Secondary | ICD-10-CM | POA: Diagnosis not present

## 2019-09-05 DIAGNOSIS — H25811 Combined forms of age-related cataract, right eye: Secondary | ICD-10-CM | POA: Diagnosis not present

## 2019-09-05 DIAGNOSIS — H2511 Age-related nuclear cataract, right eye: Secondary | ICD-10-CM | POA: Diagnosis not present

## 2019-10-24 DIAGNOSIS — H2512 Age-related nuclear cataract, left eye: Secondary | ICD-10-CM | POA: Diagnosis not present

## 2019-10-29 DIAGNOSIS — E785 Hyperlipidemia, unspecified: Secondary | ICD-10-CM | POA: Diagnosis not present

## 2019-10-29 DIAGNOSIS — H269 Unspecified cataract: Secondary | ICD-10-CM | POA: Diagnosis not present

## 2019-10-29 DIAGNOSIS — E782 Mixed hyperlipidemia: Secondary | ICD-10-CM | POA: Diagnosis not present

## 2019-10-29 DIAGNOSIS — M179 Osteoarthritis of knee, unspecified: Secondary | ICD-10-CM | POA: Diagnosis not present

## 2019-10-29 DIAGNOSIS — E039 Hypothyroidism, unspecified: Secondary | ICD-10-CM | POA: Diagnosis not present

## 2019-10-29 DIAGNOSIS — M170 Bilateral primary osteoarthritis of knee: Secondary | ICD-10-CM | POA: Diagnosis not present

## 2019-10-29 DIAGNOSIS — I10 Essential (primary) hypertension: Secondary | ICD-10-CM | POA: Diagnosis not present

## 2019-10-29 DIAGNOSIS — I480 Paroxysmal atrial fibrillation: Secondary | ICD-10-CM | POA: Diagnosis not present

## 2019-10-29 DIAGNOSIS — N400 Enlarged prostate without lower urinary tract symptoms: Secondary | ICD-10-CM | POA: Diagnosis not present

## 2019-10-29 DIAGNOSIS — I4891 Unspecified atrial fibrillation: Secondary | ICD-10-CM | POA: Diagnosis not present

## 2019-12-03 ENCOUNTER — Other Ambulatory Visit: Payer: Self-pay | Admitting: Cardiovascular Disease

## 2019-12-09 DIAGNOSIS — H269 Unspecified cataract: Secondary | ICD-10-CM | POA: Diagnosis not present

## 2019-12-09 DIAGNOSIS — G5602 Carpal tunnel syndrome, left upper limb: Secondary | ICD-10-CM | POA: Diagnosis not present

## 2019-12-09 DIAGNOSIS — M48061 Spinal stenosis, lumbar region without neurogenic claudication: Secondary | ICD-10-CM | POA: Diagnosis not present

## 2019-12-09 DIAGNOSIS — E782 Mixed hyperlipidemia: Secondary | ICD-10-CM | POA: Diagnosis not present

## 2019-12-09 DIAGNOSIS — D6869 Other thrombophilia: Secondary | ICD-10-CM | POA: Diagnosis not present

## 2019-12-09 DIAGNOSIS — E559 Vitamin D deficiency, unspecified: Secondary | ICD-10-CM | POA: Diagnosis not present

## 2019-12-09 DIAGNOSIS — E039 Hypothyroidism, unspecified: Secondary | ICD-10-CM | POA: Diagnosis not present

## 2019-12-09 DIAGNOSIS — M179 Osteoarthritis of knee, unspecified: Secondary | ICD-10-CM | POA: Diagnosis not present

## 2019-12-09 DIAGNOSIS — K5900 Constipation, unspecified: Secondary | ICD-10-CM | POA: Diagnosis not present

## 2019-12-09 DIAGNOSIS — Z0001 Encounter for general adult medical examination with abnormal findings: Secondary | ICD-10-CM | POA: Diagnosis not present

## 2019-12-09 DIAGNOSIS — N281 Cyst of kidney, acquired: Secondary | ICD-10-CM | POA: Diagnosis not present

## 2019-12-09 DIAGNOSIS — I480 Paroxysmal atrial fibrillation: Secondary | ICD-10-CM | POA: Diagnosis not present

## 2020-01-07 DIAGNOSIS — I10 Essential (primary) hypertension: Secondary | ICD-10-CM | POA: Diagnosis not present

## 2020-01-07 DIAGNOSIS — E785 Hyperlipidemia, unspecified: Secondary | ICD-10-CM | POA: Diagnosis not present

## 2020-01-07 DIAGNOSIS — H269 Unspecified cataract: Secondary | ICD-10-CM | POA: Diagnosis not present

## 2020-01-07 DIAGNOSIS — M179 Osteoarthritis of knee, unspecified: Secondary | ICD-10-CM | POA: Diagnosis not present

## 2020-01-07 DIAGNOSIS — N400 Enlarged prostate without lower urinary tract symptoms: Secondary | ICD-10-CM | POA: Diagnosis not present

## 2020-01-07 DIAGNOSIS — M170 Bilateral primary osteoarthritis of knee: Secondary | ICD-10-CM | POA: Diagnosis not present

## 2020-01-07 DIAGNOSIS — E039 Hypothyroidism, unspecified: Secondary | ICD-10-CM | POA: Diagnosis not present

## 2020-01-07 DIAGNOSIS — E782 Mixed hyperlipidemia: Secondary | ICD-10-CM | POA: Diagnosis not present

## 2020-01-07 DIAGNOSIS — I480 Paroxysmal atrial fibrillation: Secondary | ICD-10-CM | POA: Diagnosis not present

## 2020-01-07 DIAGNOSIS — I4891 Unspecified atrial fibrillation: Secondary | ICD-10-CM | POA: Diagnosis not present

## 2020-01-12 DIAGNOSIS — M48061 Spinal stenosis, lumbar region without neurogenic claudication: Secondary | ICD-10-CM | POA: Diagnosis not present

## 2020-01-19 DIAGNOSIS — M48061 Spinal stenosis, lumbar region without neurogenic claudication: Secondary | ICD-10-CM | POA: Diagnosis not present

## 2020-01-22 DIAGNOSIS — M48061 Spinal stenosis, lumbar region without neurogenic claudication: Secondary | ICD-10-CM | POA: Diagnosis not present

## 2020-01-26 DIAGNOSIS — M48061 Spinal stenosis, lumbar region without neurogenic claudication: Secondary | ICD-10-CM | POA: Diagnosis not present

## 2020-01-27 DIAGNOSIS — Z23 Encounter for immunization: Secondary | ICD-10-CM | POA: Diagnosis not present

## 2020-01-28 DIAGNOSIS — M48061 Spinal stenosis, lumbar region without neurogenic claudication: Secondary | ICD-10-CM | POA: Diagnosis not present

## 2020-02-02 DIAGNOSIS — M48061 Spinal stenosis, lumbar region without neurogenic claudication: Secondary | ICD-10-CM | POA: Diagnosis not present

## 2020-02-04 DIAGNOSIS — M48061 Spinal stenosis, lumbar region without neurogenic claudication: Secondary | ICD-10-CM | POA: Diagnosis not present

## 2020-02-11 DIAGNOSIS — M48061 Spinal stenosis, lumbar region without neurogenic claudication: Secondary | ICD-10-CM | POA: Diagnosis not present

## 2020-02-12 DIAGNOSIS — M13849 Other specified arthritis, unspecified hand: Secondary | ICD-10-CM | POA: Diagnosis not present

## 2020-02-12 DIAGNOSIS — Z23 Encounter for immunization: Secondary | ICD-10-CM | POA: Diagnosis not present

## 2020-02-12 DIAGNOSIS — G5603 Carpal tunnel syndrome, bilateral upper limbs: Secondary | ICD-10-CM | POA: Diagnosis not present

## 2020-02-18 DIAGNOSIS — M48061 Spinal stenosis, lumbar region without neurogenic claudication: Secondary | ICD-10-CM | POA: Diagnosis not present

## 2020-02-20 DIAGNOSIS — I480 Paroxysmal atrial fibrillation: Secondary | ICD-10-CM | POA: Diagnosis not present

## 2020-02-20 DIAGNOSIS — N400 Enlarged prostate without lower urinary tract symptoms: Secondary | ICD-10-CM | POA: Diagnosis not present

## 2020-02-20 DIAGNOSIS — E785 Hyperlipidemia, unspecified: Secondary | ICD-10-CM | POA: Diagnosis not present

## 2020-02-20 DIAGNOSIS — E039 Hypothyroidism, unspecified: Secondary | ICD-10-CM | POA: Diagnosis not present

## 2020-02-20 DIAGNOSIS — I4891 Unspecified atrial fibrillation: Secondary | ICD-10-CM | POA: Diagnosis not present

## 2020-02-20 DIAGNOSIS — H269 Unspecified cataract: Secondary | ICD-10-CM | POA: Diagnosis not present

## 2020-02-20 DIAGNOSIS — I10 Essential (primary) hypertension: Secondary | ICD-10-CM | POA: Diagnosis not present

## 2020-02-20 DIAGNOSIS — M179 Osteoarthritis of knee, unspecified: Secondary | ICD-10-CM | POA: Diagnosis not present

## 2020-02-20 DIAGNOSIS — M170 Bilateral primary osteoarthritis of knee: Secondary | ICD-10-CM | POA: Diagnosis not present

## 2020-02-20 DIAGNOSIS — E782 Mixed hyperlipidemia: Secondary | ICD-10-CM | POA: Diagnosis not present

## 2020-02-23 DIAGNOSIS — G5602 Carpal tunnel syndrome, left upper limb: Secondary | ICD-10-CM | POA: Diagnosis not present

## 2020-02-23 DIAGNOSIS — G5622 Lesion of ulnar nerve, left upper limb: Secondary | ICD-10-CM | POA: Diagnosis not present

## 2020-02-24 DIAGNOSIS — M48061 Spinal stenosis, lumbar region without neurogenic claudication: Secondary | ICD-10-CM | POA: Diagnosis not present

## 2020-03-03 DIAGNOSIS — M48061 Spinal stenosis, lumbar region without neurogenic claudication: Secondary | ICD-10-CM | POA: Diagnosis not present

## 2020-03-04 DIAGNOSIS — G5603 Carpal tunnel syndrome, bilateral upper limbs: Secondary | ICD-10-CM | POA: Diagnosis not present

## 2020-03-04 DIAGNOSIS — G5622 Lesion of ulnar nerve, left upper limb: Secondary | ICD-10-CM | POA: Diagnosis not present

## 2020-03-09 ENCOUNTER — Telehealth: Payer: Self-pay | Admitting: *Deleted

## 2020-03-09 NOTE — Telephone Encounter (Signed)
   [facility] Pre-operative Risk Assessment    PRIMARY CARDIOLOGIST DR KDTOIZTI  Request for surgical clearance:  1. What type of surgery is being performed?  LEFT CARPAL TUNNEL  AND CUBITAL TUNNEL RELEASE  2. When is this surgery scheduled? TBS  3. What type of clearance is required (medical clearance vs. Pharmacy clearance to hold med vs. Both)? BOTH   4. Are there any medications that need to be held prior to surgery and how long? ELIQUIS  2 DAYS PRIOR  TO SURGERY AND 2 DAYS AFTER SURGERY  5. Practice name and name of physician performing surgery?  [facility] ; DR Roseanne Kaufman   6. What is the office phone number? [phone]   7.   What is the office fax number?  [place]   Anesthesia type (None, local, MAC, general) ?  BLOCK WITH IV SEDATION   Raiford Simmonds 03/09/2020, 5:49 PM  _________________________________________________________________   (provider comments below)

## 2020-03-10 DIAGNOSIS — M48061 Spinal stenosis, lumbar region without neurogenic claudication: Secondary | ICD-10-CM | POA: Diagnosis not present

## 2020-03-10 NOTE — Telephone Encounter (Signed)
Pt has appt with Dr. Sallyanne Kuster 03/25/20. Pre OP Clearance Needed has been added to appt notes. Will forward clearance notes to MD for upcoming appt. Will send FYI to requesting office pt has appt 03/25/20 with his cardiologist. Will remove from the pre op call back pool.

## 2020-03-10 NOTE — Telephone Encounter (Signed)
   Primary Cardiologist: Sanda Klein, MD  Chart reviewed as part of pre-operative protocol coverage. Because of Tom Norton's past medical history and time since last visit, he will require a follow-up visit in order to better assess preoperative cardiovascular risk.  Pre-op covering staff: - Please schedule appointment and call patient to inform them. If patient already had an upcoming appointment within acceptable timeframe, please add "pre-op clearance" to the appointment notes so provider is aware. - Please contact requesting surgeon's office via preferred method (i.e, phone, fax) to inform them of need for appointment prior to surgery.  If applicable, this message will also be routed to pharmacy pool and/or primary cardiologist for input on holding anticoagulant/antiplatelet agent as requested below so that this information is available to the clearing provider at time of patient's appointment.   [place], [place]  03/10/2020, 9:23 AM

## 2020-03-10 NOTE — Telephone Encounter (Signed)
Patient with diagnosis of afib on Eliquis for anticoagulation.    Procedure: LEFT CARPAL TUNNEL  AND CUBITAL TUNNEL RELEASE Date of procedure: TBD  CHA2DS2-VASc Score = 3  This indicates a 3.2% annual risk of stroke. The patient's score is based upon: CHF History: 0 HTN History: 1 Diabetes History: 0 Stroke History: 0 Vascular Disease History: 0 Age Score: 2 Gender Score: 0  CrCl 43mL/min Platelet count 193K  Request is to hold Eliquis for 2 days prior and 2 days post surgery. This seems a bit long given low bleed risk of procedure, however since pt is at low risk of stroke, ok to hold as requested.

## 2020-03-23 DIAGNOSIS — M48061 Spinal stenosis, lumbar region without neurogenic claudication: Secondary | ICD-10-CM | POA: Diagnosis not present

## 2020-03-25 ENCOUNTER — Encounter: Payer: Self-pay | Admitting: Cardiovascular Disease

## 2020-03-25 ENCOUNTER — Other Ambulatory Visit: Payer: Self-pay

## 2020-03-25 ENCOUNTER — Ambulatory Visit (INDEPENDENT_AMBULATORY_CARE_PROVIDER_SITE_OTHER): Payer: Medicare Other | Admitting: Cardiovascular Disease

## 2020-03-25 VITALS — BP 116/88 | HR 75 | Ht 72.0 in | Wt 210.0 lb

## 2020-03-25 DIAGNOSIS — I480 Paroxysmal atrial fibrillation: Secondary | ICD-10-CM | POA: Diagnosis not present

## 2020-03-25 DIAGNOSIS — I7121 Aneurysm of the ascending aorta, without rupture: Secondary | ICD-10-CM

## 2020-03-25 DIAGNOSIS — I712 Thoracic aortic aneurysm, without rupture: Secondary | ICD-10-CM

## 2020-03-25 DIAGNOSIS — Z01810 Encounter for preprocedural cardiovascular examination: Secondary | ICD-10-CM

## 2020-03-25 DIAGNOSIS — Z7901 Long term (current) use of anticoagulants: Secondary | ICD-10-CM | POA: Diagnosis not present

## 2020-03-25 NOTE — Progress Notes (Signed)
Cardiology Office Note:    Date:  03/25/2020   ID:  Tom Norton, DOB Mar 19, 1930, MRN [id number]  PCP:  Josetta Huddle, MD  Cardiologist:  Sanda Klein, MD    Referring MD: Josetta Huddle, MD   Chief Complaint  Patient presents with  . Follow-up  Atrial fibrillation  History of Present Illness:    Tom Norton is a 86 y.o. male with a hx of Infrequent episodes of paroxysmal atrial fibrillation, for which he required cardioversion in 2015. He does not have diabetes mellitus, hypertension, heart failure, valvular heart disease and the only abnormalities on his echocardiogram are biatrial dilatation and mild aortic root enlargement. There was no evidence of coronary insufficiency by nuclear stress test performed at the time of his cardioversion.  He has been poorly tolerant of beta blockers (hair loss, fatigue) and verapamil (fatigue). He is no longer taking any rate control medications. He is on anticoagulation with Eliquis and has never had an embolic event, TIA or stroke.   The patient specifically denies any chest pain at rest exertion, dyspnea at rest or with exertion, orthopnea, paroxysmal nocturnal dyspnea, syncope, palpitations, focal neurological deficits, intermittent claudication, lower extremity edema, unexplained weight gain, cough, hemoptysis or wheezing.  He has pain and numbness radiating from his left hand to his left shoulder and even sometimes the left side of his neck.  He is planning to undergo a left hand carpal tunnel release with Dr. Amedeo Plenty next month.    Past Medical History:  Diagnosis Date  . Cardiomegaly   . Dilated aortic root ([place])    a. 09/2013: 4.2cm by echo.  . Enlarged prostate without lower urinary tract symptoms (luts)   . GERD (gastroesophageal reflux disease)    rare  . History of hiatal hernia   . Hyperlipidemia   . Hypertension   . Hypothyroidism   . Insomnia   . Junctional bradycardia   . OA (osteoarthritis)   . Obesity   .  Paroxysmal A-fib ([place])    a. 09/23/13 a-fib with RVR and hypotensive, requiring emergent cardioversion in ED  . Peripheral neuropathy 07/17/2017  . Vitamin D deficiency     Past Surgical History:  Procedure Laterality Date  . CARDIOVERSION  2015  . HERNIA REPAIR    . INGUINAL HERNIA REPAIR Left 05/03/2019   Procedure: HERNIA REPAIR INGUINAL ADULT WITH MESH;  Surgeon: Armandina Gemma, MD;  Location: [facility];  Service: General;  Laterality: Left;  . TOTAL KNEE ARTHROPLASTY Left 07/20/2017   Procedure: LEFT TOTAL KNEE ARTHROPLASTY;  Surgeon: Sydnee Cabal, MD;  Location: [facility];  Service: Orthopedics;  Laterality: Left;  Adductor Block  . TRANSTHORACIC ECHOCARDIOGRAM  09/23/2013   EF 55-60%; normal wall motion. Moderately dilated aortic root and ascending aorta; mild AI; moderate bi-atrial enlargement    Current Medications: Current Meds  Medication Sig  . Cholecalciferol (VITAMIN D3) 2000 units TABS Take 2,000 Units by mouth daily.  Marland Kitchen CINNAMON PO Take 1,000 mg by mouth daily.   . Coenzyme Q10 (CO Q10) 100 MG CAPS Take 100 mg by mouth daily.  . diphenhydrAMINE (BENADRYL) 25 mg capsule Take 50 mg by mouth daily as needed for allergies.  Marland Kitchen ELIQUIS 5 MG TABS tablet TAKE 1 TABLET BY MOUTH 2 TIMES DAILY.  Marland Kitchen EPINEPHrine 0.3 mg/0.3 mL IJ SOAJ injection Inject 0.3 mg into the muscle daily as needed (allergic reaction).  . eszopiclone (LUNESTA) 1 MG TABS tablet Take 1-2 mg by mouth at bedtime. Take immediately before bedtime  .  levothyroxine (SYNTHROID, LEVOTHROID) 25 MCG tablet Take 25 mcg by mouth daily before breakfast.  . Multiple Vitamin (MULTIVITAMIN WITH MINERALS) TABS Take 1 tablet by mouth daily.  . Tamsulosin HCl (FLOMAX) 0.4 MG CAPS Take 0.4 mg by mouth daily after supper.     Allergies:   Doxycycline hyclate and Other   Social History   Socioeconomic History  . Marital status: Married    Spouse name: Not on file  . Number of children: Not on file  . Years of education: Not on file  .  Highest education level: Not on file  Occupational History  . Occupation: retired  Tobacco Use  . Smoking status: Never Smoker  . Smokeless tobacco: Never Used  Vaping Use  . Vaping Use: Never used  Substance and Sexual Activity  . Alcohol use: Yes    Alcohol/week: 10.0 standard drinks    Types: 10 Glasses of wine per week    Comment: couple of glasses of wine a day most days  . Drug use: No  . Sexual activity: Not on file  Other Topics Concern  . Not on file  Social History Narrative   Married. Wife is a patient of Dr. Radford Pax.   At least one son.   Never smoked. Up to 10 glasses of wine a week   Social Determinants of Health   Financial Resource Strain:   . Difficulty of Paying Living Expenses: Not on file  Food Insecurity:   . Worried About Charity fundraiser in the Last Year: Not on file  . Ran Out of Food in the Last Year: Not on file  Transportation Needs:   . Lack of Transportation (Medical): Not on file  . Lack of Transportation (Non-Medical): Not on file  Physical Activity:   . Days of Exercise per Week: Not on file  . Minutes of Exercise per Session: Not on file  Stress:   . Feeling of Stress : Not on file  Social Connections:   . Frequency of Communication with Friends and Family: Not on file  . Frequency of Social Gatherings with Friends and Family: Not on file  . Attends Religious Services: Not on file  . Active Member of Clubs or Organizations: Not on file  . Attends Archivist Meetings: Not on file  . Marital Status: Not on file     Family History: The patient's family history includes Heart disease in his brother and father; Stroke in his brother. ROS:   Please see the history of present illness.    All other systems are reviewed and are negative  EKGs/Labs/Other Studies Reviewed:    EKG:  EKG is ordered today.  It shows sinus rhythm and is a completely normal tracing.  Recent Labs: 11/25/2018 Hemoglobin 15.2, creatinine 1.26,  potassium 3.9, normal liver function tests, normal TSH 12/09/2019 hemoglobin 16.3, creatinine 1.31, potassium 4.2, TSH mildly elevated at 7.64 Recent Lipid Panel 11/25/2018 cholesterol 216, HDL 52, LDL 132, triglycerides 105 12/09/2019 cholesterol 212, HDL 55, LDL 133, triglycerides 135  Physical Exam:    VS:  BP 116/88 (BP Location: Left Arm, Patient Position: Sitting, Cuff Size: Normal)   Pulse 75   Ht 6' (1.829 m)   Wt 210 lb (95.3 kg)   BMI 28.48 kg/m     Wt Readings from Last 3 Encounters:  03/25/20 210 lb (95.3 kg)  05/02/19 208 lb (94.3 kg)  12/25/18 211 lb 6.4 oz (95.9 kg)     General: Alert, oriented x3, no  distress, appears well Head: no evidence of trauma, PERRL, EOMI, no exophtalmos or lid lag, no myxedema, no xanthelasma; normal ears, nose and oropharynx Neck: normal jugular venous pulsations and no hepatojugular reflux; brisk carotid pulses without delay and no carotid bruits Chest: clear to auscultation, no signs of consolidation by percussion or palpation, normal fremitus, symmetrical and full respiratory excursions Cardiovascular: normal position and quality of the apical impulse, regular rhythm, normal first and second heart sounds, no murmurs, rubs or gallops Abdomen: no tenderness or distention, no masses by palpation, no abnormal pulsatility or arterial bruits, normal bowel sounds, no hepatosplenomegaly Extremities: no clubbing, cyanosis or edema; 2+ radial, ulnar and brachial pulses bilaterally; 2+ right femoral, posterior tibial and dorsalis pedis pulses; 2+ left femoral, posterior tibial and dorsalis pedis pulses; no subclavian or femoral bruits Neurological: grossly nonfocal Psych: Normal mood and affect    ASSESSMENT:    1. Paroxysmal atrial fibrillation (HCC)   2. Long term current use of anticoagulant therapy   3. Ascending aortic aneurysm ([place])   4. Preoperative cardiovascular examination    PLAN:    In order of problems listed above:  1. AFib:  Asymptomatic.  No clinically apparent recurrence in years.  CHADSVasc 2 (age only).  The risk of embolic events is low with temporary interruption for hand surgery. 2. Eliquis: Well-tolerated without any bleeding complications. 3. Asc Ao dilation: Mild (max 44 mm).  Since is asymptomatic and is now 86 years old, we have decided not to perform routine monitoring since it is unlikely we would ever recommend surgery for an asymptomatic aneurysm. 4. Preop eval: Low risk for major cardiovascular complications with planned hand surgery.  Patient Instructions  Medication Instructions:  No changes *If you need a refill on your cardiac medications before your next appointment, please call your pharmacy*   Lab Work: None ordered If you have labs (blood work) drawn today and your tests are completely normal, you will receive your results only by: Marland Kitchen MyChart Message (if you have MyChart) OR . A paper copy in the mail If you have any lab test that is abnormal or we need to change your treatment, we will call you to review the results.   Testing/Procedures: None ordered   Follow-Up: At [facility], you and your health needs are our priority.  As part of our continuing mission to provide you with exceptional heart care, we have created designated Provider Care Teams.  These Care Teams include your primary Cardiologist (physician) and Advanced Practice Providers (APPs -  Physician Assistants and Nurse Practitioners) who all work together to provide you with the care you need, when you need it.  We recommend signing up for the patient portal called "MyChart".  Sign up information is provided on this After Visit Summary.  MyChart is used to connect with patients for Virtual Visits (Telemedicine).  Patients are able to view lab/test results, encounter notes, upcoming appointments, etc.  Non-urgent messages can be sent to your provider as well.   To learn more about what you can do with MyChart, go to  [email or web address].    Your next appointment:   12 month(s)  The format for your next appointment:   In Person  Provider:   You may see Sanda Klein, MD or one of the following Advanced Practice Providers on your designated Care Team:    Almyra Deforest, PA-C  Fabian Sharp, [place] or   Roby Lofts, PA-C      Signed, Sanda Klein, MD  03/25/2020 11:47  AM    [place]

## 2020-03-25 NOTE — Patient Instructions (Signed)

## 2020-04-27 DIAGNOSIS — G5602 Carpal tunnel syndrome, left upper limb: Secondary | ICD-10-CM | POA: Diagnosis not present

## 2020-04-27 DIAGNOSIS — G8918 Other acute postprocedural pain: Secondary | ICD-10-CM | POA: Diagnosis not present

## 2020-04-27 DIAGNOSIS — G5622 Lesion of ulnar nerve, left upper limb: Secondary | ICD-10-CM | POA: Diagnosis not present

## 2020-05-13 DIAGNOSIS — G4700 Insomnia, unspecified: Secondary | ICD-10-CM | POA: Diagnosis not present

## 2020-05-13 DIAGNOSIS — I480 Paroxysmal atrial fibrillation: Secondary | ICD-10-CM | POA: Diagnosis not present

## 2020-05-13 DIAGNOSIS — I4891 Unspecified atrial fibrillation: Secondary | ICD-10-CM | POA: Diagnosis not present

## 2020-05-13 DIAGNOSIS — M179 Osteoarthritis of knee, unspecified: Secondary | ICD-10-CM | POA: Diagnosis not present

## 2020-05-13 DIAGNOSIS — N400 Enlarged prostate without lower urinary tract symptoms: Secondary | ICD-10-CM | POA: Diagnosis not present

## 2020-05-13 DIAGNOSIS — H269 Unspecified cataract: Secondary | ICD-10-CM | POA: Diagnosis not present

## 2020-05-13 DIAGNOSIS — E782 Mixed hyperlipidemia: Secondary | ICD-10-CM | POA: Diagnosis not present

## 2020-05-13 DIAGNOSIS — E039 Hypothyroidism, unspecified: Secondary | ICD-10-CM | POA: Diagnosis not present

## 2020-05-13 DIAGNOSIS — E785 Hyperlipidemia, unspecified: Secondary | ICD-10-CM | POA: Diagnosis not present

## 2020-05-13 DIAGNOSIS — I10 Essential (primary) hypertension: Secondary | ICD-10-CM | POA: Diagnosis not present

## 2020-05-13 DIAGNOSIS — M170 Bilateral primary osteoarthritis of knee: Secondary | ICD-10-CM | POA: Diagnosis not present

## 2020-06-02 DIAGNOSIS — M79642 Pain in left hand: Secondary | ICD-10-CM | POA: Diagnosis not present

## 2020-06-04 DIAGNOSIS — M79642 Pain in left hand: Secondary | ICD-10-CM | POA: Diagnosis not present

## 2020-06-08 ENCOUNTER — Other Ambulatory Visit: Payer: Self-pay | Admitting: Cardiovascular Disease

## 2020-06-14 DIAGNOSIS — N400 Enlarged prostate without lower urinary tract symptoms: Secondary | ICD-10-CM | POA: Diagnosis not present

## 2020-06-14 DIAGNOSIS — D6869 Other thrombophilia: Secondary | ICD-10-CM | POA: Diagnosis not present

## 2020-06-14 DIAGNOSIS — M170 Bilateral primary osteoarthritis of knee: Secondary | ICD-10-CM | POA: Diagnosis not present

## 2020-06-14 DIAGNOSIS — E782 Mixed hyperlipidemia: Secondary | ICD-10-CM | POA: Diagnosis not present

## 2020-06-14 DIAGNOSIS — I10 Essential (primary) hypertension: Secondary | ICD-10-CM | POA: Diagnosis not present

## 2020-06-14 DIAGNOSIS — H269 Unspecified cataract: Secondary | ICD-10-CM | POA: Diagnosis not present

## 2020-06-14 DIAGNOSIS — Z4789 Encounter for other orthopedic aftercare: Secondary | ICD-10-CM | POA: Diagnosis not present

## 2020-06-14 DIAGNOSIS — G4700 Insomnia, unspecified: Secondary | ICD-10-CM | POA: Diagnosis not present

## 2020-06-14 DIAGNOSIS — E039 Hypothyroidism, unspecified: Secondary | ICD-10-CM | POA: Diagnosis not present

## 2020-06-14 DIAGNOSIS — I480 Paroxysmal atrial fibrillation: Secondary | ICD-10-CM | POA: Diagnosis not present

## 2020-06-14 DIAGNOSIS — S60222A Contusion of left hand, initial encounter: Secondary | ICD-10-CM | POA: Diagnosis not present

## 2020-06-17 DIAGNOSIS — R29898 Other symptoms and signs involving the musculoskeletal system: Secondary | ICD-10-CM | POA: Diagnosis not present

## 2020-06-24 DIAGNOSIS — R29898 Other symptoms and signs involving the musculoskeletal system: Secondary | ICD-10-CM | POA: Diagnosis not present

## 2020-07-07 DIAGNOSIS — I10 Essential (primary) hypertension: Secondary | ICD-10-CM | POA: Diagnosis not present

## 2020-07-07 DIAGNOSIS — I480 Paroxysmal atrial fibrillation: Secondary | ICD-10-CM | POA: Diagnosis not present

## 2020-07-07 DIAGNOSIS — G4700 Insomnia, unspecified: Secondary | ICD-10-CM | POA: Diagnosis not present

## 2020-07-07 DIAGNOSIS — H269 Unspecified cataract: Secondary | ICD-10-CM | POA: Diagnosis not present

## 2020-07-07 DIAGNOSIS — E782 Mixed hyperlipidemia: Secondary | ICD-10-CM | POA: Diagnosis not present

## 2020-07-07 DIAGNOSIS — M170 Bilateral primary osteoarthritis of knee: Secondary | ICD-10-CM | POA: Diagnosis not present

## 2020-07-07 DIAGNOSIS — N400 Enlarged prostate without lower urinary tract symptoms: Secondary | ICD-10-CM | POA: Diagnosis not present

## 2020-07-07 DIAGNOSIS — E039 Hypothyroidism, unspecified: Secondary | ICD-10-CM | POA: Diagnosis not present

## 2020-09-07 DIAGNOSIS — N400 Enlarged prostate without lower urinary tract symptoms: Secondary | ICD-10-CM | POA: Diagnosis not present

## 2020-09-07 DIAGNOSIS — G4700 Insomnia, unspecified: Secondary | ICD-10-CM | POA: Diagnosis not present

## 2020-09-07 DIAGNOSIS — E039 Hypothyroidism, unspecified: Secondary | ICD-10-CM | POA: Diagnosis not present

## 2020-09-07 DIAGNOSIS — I480 Paroxysmal atrial fibrillation: Secondary | ICD-10-CM | POA: Diagnosis not present

## 2020-09-07 DIAGNOSIS — E782 Mixed hyperlipidemia: Secondary | ICD-10-CM | POA: Diagnosis not present

## 2020-09-07 DIAGNOSIS — M179 Osteoarthritis of knee, unspecified: Secondary | ICD-10-CM | POA: Diagnosis not present

## 2020-09-07 DIAGNOSIS — M170 Bilateral primary osteoarthritis of knee: Secondary | ICD-10-CM | POA: Diagnosis not present

## 2020-09-07 DIAGNOSIS — I10 Essential (primary) hypertension: Secondary | ICD-10-CM | POA: Diagnosis not present

## 2020-09-22 DIAGNOSIS — L820 Inflamed seborrheic keratosis: Secondary | ICD-10-CM | POA: Diagnosis not present

## 2020-09-22 DIAGNOSIS — L570 Actinic keratosis: Secondary | ICD-10-CM | POA: Diagnosis not present

## 2020-09-22 DIAGNOSIS — Z85828 Personal history of other malignant neoplasm of skin: Secondary | ICD-10-CM | POA: Diagnosis not present

## 2020-09-22 DIAGNOSIS — L821 Other seborrheic keratosis: Secondary | ICD-10-CM | POA: Diagnosis not present

## 2020-09-22 DIAGNOSIS — D485 Neoplasm of uncertain behavior of skin: Secondary | ICD-10-CM | POA: Diagnosis not present

## 2020-10-12 DIAGNOSIS — M170 Bilateral primary osteoarthritis of knee: Secondary | ICD-10-CM | POA: Diagnosis not present

## 2020-10-12 DIAGNOSIS — I10 Essential (primary) hypertension: Secondary | ICD-10-CM | POA: Diagnosis not present

## 2020-10-12 DIAGNOSIS — H269 Unspecified cataract: Secondary | ICD-10-CM | POA: Diagnosis not present

## 2020-10-12 DIAGNOSIS — E782 Mixed hyperlipidemia: Secondary | ICD-10-CM | POA: Diagnosis not present

## 2020-10-12 DIAGNOSIS — I480 Paroxysmal atrial fibrillation: Secondary | ICD-10-CM | POA: Diagnosis not present

## 2020-10-12 DIAGNOSIS — E785 Hyperlipidemia, unspecified: Secondary | ICD-10-CM | POA: Diagnosis not present

## 2020-10-12 DIAGNOSIS — I4891 Unspecified atrial fibrillation: Secondary | ICD-10-CM | POA: Diagnosis not present

## 2020-10-12 DIAGNOSIS — N400 Enlarged prostate without lower urinary tract symptoms: Secondary | ICD-10-CM | POA: Diagnosis not present

## 2020-10-12 DIAGNOSIS — G4700 Insomnia, unspecified: Secondary | ICD-10-CM | POA: Diagnosis not present

## 2020-10-12 DIAGNOSIS — E039 Hypothyroidism, unspecified: Secondary | ICD-10-CM | POA: Diagnosis not present

## 2020-11-24 ENCOUNTER — Other Ambulatory Visit: Payer: Self-pay | Admitting: Cardiovascular Disease

## 2020-11-24 NOTE — Telephone Encounter (Signed)
Prescription refill request for Eliquis received. Indication:afib Last office visit:croitoru 03/25/20 Scr: 1.180 mg/ 06/14/2020 Age: 39mWeight:95.3kg

## 2020-12-07 DIAGNOSIS — E785 Hyperlipidemia, unspecified: Secondary | ICD-10-CM | POA: Diagnosis not present

## 2020-12-07 DIAGNOSIS — M179 Osteoarthritis of knee, unspecified: Secondary | ICD-10-CM | POA: Diagnosis not present

## 2020-12-07 DIAGNOSIS — I480 Paroxysmal atrial fibrillation: Secondary | ICD-10-CM | POA: Diagnosis not present

## 2020-12-07 DIAGNOSIS — F068 Other specified mental disorders due to known physiological condition: Secondary | ICD-10-CM | POA: Diagnosis not present

## 2020-12-07 DIAGNOSIS — H269 Unspecified cataract: Secondary | ICD-10-CM | POA: Diagnosis not present

## 2020-12-07 DIAGNOSIS — G4700 Insomnia, unspecified: Secondary | ICD-10-CM | POA: Diagnosis not present

## 2020-12-07 DIAGNOSIS — I4891 Unspecified atrial fibrillation: Secondary | ICD-10-CM | POA: Diagnosis not present

## 2020-12-07 DIAGNOSIS — M170 Bilateral primary osteoarthritis of knee: Secondary | ICD-10-CM | POA: Diagnosis not present

## 2020-12-07 DIAGNOSIS — I10 Essential (primary) hypertension: Secondary | ICD-10-CM | POA: Diagnosis not present

## 2020-12-07 DIAGNOSIS — E039 Hypothyroidism, unspecified: Secondary | ICD-10-CM | POA: Diagnosis not present

## 2020-12-07 DIAGNOSIS — N400 Enlarged prostate without lower urinary tract symptoms: Secondary | ICD-10-CM | POA: Diagnosis not present

## 2020-12-07 DIAGNOSIS — E782 Mixed hyperlipidemia: Secondary | ICD-10-CM | POA: Diagnosis not present

## 2020-12-30 DIAGNOSIS — I480 Paroxysmal atrial fibrillation: Secondary | ICD-10-CM | POA: Diagnosis not present

## 2020-12-30 DIAGNOSIS — N281 Cyst of kidney, acquired: Secondary | ICD-10-CM | POA: Diagnosis not present

## 2020-12-30 DIAGNOSIS — E782 Mixed hyperlipidemia: Secondary | ICD-10-CM | POA: Diagnosis not present

## 2020-12-30 DIAGNOSIS — M48061 Spinal stenosis, lumbar region without neurogenic claudication: Secondary | ICD-10-CM | POA: Diagnosis not present

## 2020-12-30 DIAGNOSIS — F068 Other specified mental disorders due to known physiological condition: Secondary | ICD-10-CM | POA: Diagnosis not present

## 2020-12-30 DIAGNOSIS — D6869 Other thrombophilia: Secondary | ICD-10-CM | POA: Diagnosis not present

## 2020-12-30 DIAGNOSIS — M21611 Bunion of right foot: Secondary | ICD-10-CM | POA: Diagnosis not present

## 2020-12-30 DIAGNOSIS — M179 Osteoarthritis of knee, unspecified: Secondary | ICD-10-CM | POA: Diagnosis not present

## 2020-12-30 DIAGNOSIS — E559 Vitamin D deficiency, unspecified: Secondary | ICD-10-CM | POA: Diagnosis not present

## 2020-12-30 DIAGNOSIS — E039 Hypothyroidism, unspecified: Secondary | ICD-10-CM | POA: Diagnosis not present

## 2020-12-30 DIAGNOSIS — I10 Essential (primary) hypertension: Secondary | ICD-10-CM | POA: Diagnosis not present

## 2020-12-30 DIAGNOSIS — N400 Enlarged prostate without lower urinary tract symptoms: Secondary | ICD-10-CM | POA: Diagnosis not present

## 2020-12-30 DIAGNOSIS — Z0001 Encounter for general adult medical examination with abnormal findings: Secondary | ICD-10-CM | POA: Diagnosis not present

## 2020-12-30 DIAGNOSIS — K5900 Constipation, unspecified: Secondary | ICD-10-CM | POA: Diagnosis not present

## 2021-01-14 DIAGNOSIS — H903 Sensorineural hearing loss, bilateral: Secondary | ICD-10-CM | POA: Diagnosis not present

## 2021-01-31 DIAGNOSIS — M79671 Pain in right foot: Secondary | ICD-10-CM | POA: Diagnosis not present

## 2021-01-31 DIAGNOSIS — M205X1 Other deformities of toe(s) (acquired), right foot: Secondary | ICD-10-CM | POA: Diagnosis not present

## 2021-01-31 DIAGNOSIS — M21611 Bunion of right foot: Secondary | ICD-10-CM | POA: Diagnosis not present

## 2021-02-09 ENCOUNTER — Telehealth: Payer: Self-pay

## 2021-02-09 NOTE — Telephone Encounter (Signed)
   [facility] Pre-operative Risk Assessment    Patient Name: Tom Norton  DOB: Jul 27, 1929 MRN: [id number]  HEARTCARE STAFF:  - IMPORTANT!!!!!! Under Visit Info/Reason for Call, type in Other and utilize the format Clearance MM/DD/YY or Clearance TBD. Do not use dashes or single digits. - Please review there is not already an duplicate clearance open for this procedure. - If request is for dental extraction, please clarify the # of teeth to be extracted. - If the patient is currently at the dentist's office, call Pre-Op Callback Staff (MA/nurse) to input urgent request.  - If the patient is not currently in the dentist office, please route to the Pre-Op pool.  Request for surgical clearance:  What type of surgery is being performed? Extraction of Tooth #2  When is this surgery scheduled? TBD  What type of clearance is required (medical clearance vs. Pharmacy clearance to hold med vs. Both)? Pharmacy  Are there any medications that need to be held prior to surgery and how long? Eliquis  Practice name and name of physician performing surgery? [place]. Heywood Footman MD  What is the office phone number? [phone]   7.   What is the office fax number? [phone]  8.   Anesthesia type (None, local, MAC, general) ? Local   Monia Pouch 02/09/2021, 2:25 PM  _________________________________________________________________   (provider comments below)

## 2021-02-16 NOTE — Telephone Encounter (Signed)
   Pt and his wife calling back to follow up pre op clearance

## 2021-02-16 NOTE — Telephone Encounter (Signed)
Both the pt and his wife are aware clearance has been faxed to DDS office. Pt and wife both aware no need to hold Eliquis, see notes from Pharm-D and pre op provider Melina Copa, PAC. Pt and his wife thanked me for the call back.

## 2021-02-16 NOTE — Telephone Encounter (Addendum)
I have completed this clearance and I routed to surgeon.  Will route to callback to return call to pt/wife as requested to let them know this was completed today.  Do not see the original request on 10/19 was routed to pre-op box for review, will cc to sender to make them aware.  Thank you team!

## 2021-02-16 NOTE — Telephone Encounter (Signed)
   Patient Name: Tom Norton  DOB: June 18, 1929 MRN: [id number]  Primary Cardiologist: Sanda Klein, MD  Chart reviewed as part of pre-operative protocol coverage.   IF SIMPLE EXTRACTION/CLEANINGS: Simple dental extractions (I.e. 1-2 teeth) are considered low risk procedures per guidelines and generally do not require any specific cardiac clearance. It is also generally accepted that for simple extractions and dental cleanings, there is no need to interrupt blood thinner therapy. Would not stop Eliquis for single tooth extraction.  SBE prophylaxis is not required for the patient from a cardiac standpoint.  I will route this recommendation to the requesting party via Epic fax function and remove from pre-op pool.  Please call with questions.  Charlie Pitter, PA-C 02/16/2021, 9:23 AM

## 2021-02-17 ENCOUNTER — Emergency Department (HOSPITAL_COMMUNITY): Payer: Medicare Other

## 2021-02-17 ENCOUNTER — Encounter (HOSPITAL_COMMUNITY): Payer: Self-pay | Admitting: Emergency Medicine

## 2021-02-17 ENCOUNTER — Inpatient Hospital Stay (HOSPITAL_COMMUNITY)
Admission: EM | Admit: 2021-02-17 | Discharge: 2021-02-22 | DRG: 982 | Disposition: A | Discharge status: Home Health | Payer: Medicare Other | Attending: Internal Medicine | Admitting: Internal Medicine

## 2021-02-17 DIAGNOSIS — S81002A Unspecified open wound, left knee, initial encounter: Secondary | ICD-10-CM | POA: Diagnosis not present

## 2021-02-17 DIAGNOSIS — I480 Paroxysmal atrial fibrillation: Secondary | ICD-10-CM | POA: Diagnosis not present

## 2021-02-17 DIAGNOSIS — Z7901 Long term (current) use of anticoagulants: Secondary | ICD-10-CM | POA: Diagnosis not present

## 2021-02-17 DIAGNOSIS — Y92481 Parking lot as the place of occurrence of the external cause: Secondary | ICD-10-CM

## 2021-02-17 DIAGNOSIS — E559 Vitamin D deficiency, unspecified: Secondary | ICD-10-CM | POA: Diagnosis present

## 2021-02-17 DIAGNOSIS — Z96652 Presence of left artificial knee joint: Secondary | ICD-10-CM | POA: Diagnosis not present

## 2021-02-17 DIAGNOSIS — S72402A Unspecified fracture of lower end of left femur, initial encounter for closed fracture: Secondary | ICD-10-CM | POA: Diagnosis not present

## 2021-02-17 DIAGNOSIS — S2239XA Fracture of one rib, unspecified side, initial encounter for closed fracture: Secondary | ICD-10-CM | POA: Diagnosis present

## 2021-02-17 DIAGNOSIS — E039 Hypothyroidism, unspecified: Secondary | ICD-10-CM | POA: Diagnosis present

## 2021-02-17 DIAGNOSIS — N400 Enlarged prostate without lower urinary tract symptoms: Secondary | ICD-10-CM | POA: Diagnosis present

## 2021-02-17 DIAGNOSIS — G4700 Insomnia, unspecified: Secondary | ICD-10-CM | POA: Diagnosis present

## 2021-02-17 DIAGNOSIS — Z79890 Hormone replacement therapy (postmenopausal): Secondary | ICD-10-CM | POA: Diagnosis not present

## 2021-02-17 DIAGNOSIS — G629 Polyneuropathy, unspecified: Secondary | ICD-10-CM

## 2021-02-17 DIAGNOSIS — Z20822 Contact with and (suspected) exposure to covid-19: Secondary | ICD-10-CM | POA: Diagnosis not present

## 2021-02-17 DIAGNOSIS — I10 Essential (primary) hypertension: Secondary | ICD-10-CM | POA: Diagnosis present

## 2021-02-17 DIAGNOSIS — S2232XA Fracture of one rib, left side, initial encounter for closed fracture: Secondary | ICD-10-CM

## 2021-02-17 DIAGNOSIS — R7302 Impaired glucose tolerance (oral): Secondary | ICD-10-CM | POA: Diagnosis present

## 2021-02-17 DIAGNOSIS — S81012A Laceration without foreign body, left knee, initial encounter: Secondary | ICD-10-CM | POA: Diagnosis not present

## 2021-02-17 DIAGNOSIS — M25562 Pain in left knee: Secondary | ICD-10-CM | POA: Diagnosis not present

## 2021-02-17 DIAGNOSIS — Z23 Encounter for immunization: Secondary | ICD-10-CM | POA: Diagnosis not present

## 2021-02-17 DIAGNOSIS — K219 Gastro-esophageal reflux disease without esophagitis: Secondary | ICD-10-CM | POA: Diagnosis present

## 2021-02-17 DIAGNOSIS — E785 Hyperlipidemia, unspecified: Secondary | ICD-10-CM | POA: Diagnosis present

## 2021-02-17 DIAGNOSIS — S299XXA Unspecified injury of thorax, initial encounter: Secondary | ICD-10-CM | POA: Diagnosis not present

## 2021-02-17 DIAGNOSIS — W19XXXA Unspecified fall, initial encounter: Secondary | ICD-10-CM | POA: Diagnosis not present

## 2021-02-17 DIAGNOSIS — R600 Localized edema: Secondary | ICD-10-CM | POA: Diagnosis not present

## 2021-02-17 LAB — BASIC METABOLIC PANEL
Anion gap: 10 (ref 5–15)
BUN: 15 mg/dL (ref 8–23)
CO2: 22 mmol/L (ref 22–32)
Calcium: 9.4 mg/dL (ref 8.9–10.3)
Chloride: 106 mmol/L (ref 98–111)
Creatinine, Ser: 1.16 mg/dL (ref 0.61–1.24)
GFR, Estimated: >60 mL/min (ref >60)
Glucose, Bld: 110 mg/dL — ABNORMAL HIGH (ref 70–99)
Potassium: 4.3 mmol/L (ref 3.5–5.1)
Sodium: 138 mmol/L (ref 135–145)

## 2021-02-17 LAB — CBC
HCT: 44.4 % (ref 39.0–52.0)
Hemoglobin: 15.1 g/dL (ref 13.0–17.0)
MCH: 32.5 pg (ref 26.0–34.0)
MCHC: 34 g/dL (ref 30.0–36.0)
MCV: 95.5 fL (ref 80.0–100.0)
Platelets: 174 10*3/uL (ref 150–400)
RBC: 4.65 MIL/uL (ref 4.22–5.81)
RDW: 13.1 % (ref 11.5–15.5)
WBC: 6.7 10*3/uL (ref 4.0–10.5)
nRBC: 0 % (ref 0.0–0.2)

## 2021-02-17 LAB — RESP PANEL BY RT-PCR (FLU A&B, COVID) ARPGX2
Influenza A by PCR: NEGATIVE
Influenza B by PCR: NEGATIVE
SARS Coronavirus 2 by RT PCR: NEGATIVE

## 2021-02-17 LAB — PROTIME-INR
INR: 1.3 — ABNORMAL HIGH (ref 0.8–1.2)
Prothrombin Time: 15.8 seconds — ABNORMAL HIGH (ref 11.4–15.2)

## 2021-02-17 MED ORDER — SODIUM CHLORIDE 0.9 % IV SOLN: INTRAVENOUS | Status: AC

## 2021-02-17 MED ORDER — CEFAZOLIN SODIUM-DEXTROSE 2-4 GM/100ML-% IV SOLN
2.0000 g | INTRAVENOUS | Status: AC
  Administered 2021-02-17: 2 g via INTRAVENOUS
  Filled 2021-02-17: qty 100

## 2021-02-17 MED ORDER — ADULT MULTIVITAMIN W/MINERALS CH
1.0000 | ORAL_TABLET | Freq: Every day | ORAL | Status: DC
  Administered 2021-02-19 – 2021-02-22 (×4): 1 via ORAL
  Filled 2021-02-17 (×4): qty 1

## 2021-02-17 MED ORDER — MORPHINE SULFATE (PF) 2 MG/ML IV SOLN
2.0000 mg | INTRAVENOUS | Status: DC | PRN
  Administered 2021-02-17 – 2021-02-18 (×3): 2 mg via INTRAVENOUS
  Filled 2021-02-17 (×3): qty 1

## 2021-02-17 MED ORDER — APOAEQUORIN 20 MG PO CAPS: 20.0000 mg | ORAL_CAPSULE | Freq: Every day | ORAL | Status: DC

## 2021-02-17 MED ORDER — LIDOCAINE 5 % EX PTCH
1.0000 | MEDICATED_PATCH | CUTANEOUS | Status: DC
  Administered 2021-02-17 – 2021-02-21 (×4): 1 via TRANSDERMAL
  Filled 2021-02-17 (×4): qty 1

## 2021-02-17 MED ORDER — VITAMIN D 25 MCG (1000 UNIT) PO TABS
2000.0000 [IU] | ORAL_TABLET | Freq: Every day | ORAL | Status: DC
  Administered 2021-02-19 – 2021-02-22 (×4): 2000 [IU] via ORAL
  Filled 2021-02-17 (×4): qty 2

## 2021-02-17 MED ORDER — LEVOTHYROXINE SODIUM 25 MCG PO TABS
25.0000 ug | ORAL_TABLET | Freq: Every day | ORAL | Status: DC
  Administered 2021-02-18 – 2021-02-22 (×4): 25 ug via ORAL
  Filled 2021-02-17 (×4): qty 1

## 2021-02-17 MED ORDER — CO Q10 100 MG PO CAPS: 100.0000 mg | ORAL_CAPSULE | Freq: Every day | ORAL | Status: DC

## 2021-02-17 MED ORDER — TAMSULOSIN HCL 0.4 MG PO CAPS
0.4000 mg | ORAL_CAPSULE | Freq: Every day | ORAL | Status: DC
  Administered 2021-02-19 – 2021-02-21 (×3): 0.4 mg via ORAL
  Filled 2021-02-17 (×3): qty 1

## 2021-02-17 MED ORDER — TETANUS-DIPHTH-ACELL PERTUSSIS 5-2.5-18.5 LF-MCG/0.5 IM SUSY
0.5000 mL | PREFILLED_SYRINGE | Freq: Once | INTRAMUSCULAR | Status: AC
  Administered 2021-02-17: 0.5 mL via INTRAMUSCULAR
  Filled 2021-02-17: qty 0.5

## 2021-02-17 MED ORDER — ZOLPIDEM TARTRATE 5 MG PO TABS
5.0000 mg | ORAL_TABLET | Freq: Every evening | ORAL | Status: DC | PRN
  Administered 2021-02-17 – 2021-02-21 (×4): 5 mg via ORAL
  Filled 2021-02-17 (×4): qty 1
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
## 2021-02-17 NOTE — ED Provider Notes (Signed)
Blood pressure 132/72, pulse 74, temperature 97.7 F (36.5 C), temperature source Oral, resp. rate 18, SpO2 96 %.  Assuming care from Dr. Alvino Chapel.  In short, Tom Norton is a 87 y.o. male with a chief complaint of Knee Pain .  Refer to the original H&P for additional details.  The current plan of care is to follow up after ortho evaluation.  Spoke with Tom Odor, PA-C. Plan for Dr. Christena Flake to evaluate and likely washout in the OR, possibly tomorrow. Will discuss admit with the medical service.   Discussed patient's case with [facility] to request admission. Patient and family (if present) updated with plan. Care transferred to [facility] service.  I reviewed all nursing notes, vitals, pertinent old records, EKGs, labs, imaging (as available).     Margette Fast, MD 02/17/21 [phone]

## 2021-02-17 NOTE — Plan of Care (Signed)
  Problem: Education: Goal: Knowledge of General Education information will improve Description: Including pain rating scale, medication(s)/side effects and non-pharmacologic comfort measures Outcome: Progressing   Problem: Clinical Measurements: Goal: Ability to maintain clinical measurements within normal limits will improve Outcome: Progressing   
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
## 2021-02-17 NOTE — Progress Notes (Signed)
Patient has order for Cardiac monitor, but we are out of it, trying to get some from other unit, will continue to  monitor, pt is doing well, VSS.
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
## 2021-02-18 ENCOUNTER — Encounter (HOSPITAL_COMMUNITY)
Admission: EM | Disposition: A | Discharge status: Home Health | Payer: Self-pay | Source: Home / Self Care | Attending: Family Medicine

## 2021-02-18 ENCOUNTER — Inpatient Hospital Stay (HOSPITAL_COMMUNITY): Payer: Medicare Other | Admitting: Anesthesiology

## 2021-02-18 ENCOUNTER — Encounter (HOSPITAL_COMMUNITY): Payer: Self-pay | Admitting: Family Medicine

## 2021-02-18 DIAGNOSIS — W19XXXA Unspecified fall, initial encounter: Secondary | ICD-10-CM | POA: Diagnosis not present

## 2021-02-18 DIAGNOSIS — S2232XA Fracture of one rib, left side, initial encounter for closed fracture: Secondary | ICD-10-CM | POA: Diagnosis not present

## 2021-02-18 DIAGNOSIS — S81002A Unspecified open wound, left knee, initial encounter: Secondary | ICD-10-CM

## 2021-02-18 DIAGNOSIS — S81012A Laceration without foreign body, left knee, initial encounter: Principal | ICD-10-CM

## 2021-02-18 HISTORY — PX: I & D EXTREMITY: SHX5045

## 2021-02-18 LAB — CBC
HCT: 44.1 % (ref 39.0–52.0)
Hemoglobin: 14.9 g/dL (ref 13.0–17.0)
MCH: 31.8 pg (ref 26.0–34.0)
MCHC: 33.8 g/dL (ref 30.0–36.0)
MCV: 94 fL (ref 80.0–100.0)
Platelets: 172 10*3/uL (ref 150–400)
RBC: 4.69 MIL/uL (ref 4.22–5.81)
RDW: 13.1 % (ref 11.5–15.5)
WBC: 7.4 10*3/uL (ref 4.0–10.5)
nRBC: 0 % (ref 0.0–0.2)

## 2021-02-18 LAB — BASIC METABOLIC PANEL
Anion gap: 7 (ref 5–15)
BUN: 13 mg/dL (ref 8–23)
CO2: 22 mmol/L (ref 22–32)
Calcium: 9 mg/dL (ref 8.9–10.3)
Chloride: 107 mmol/L (ref 98–111)
Creatinine, Ser: 0.96 mg/dL (ref 0.61–1.24)
GFR, Estimated: >60 mL/min (ref >60)
Glucose, Bld: 105 mg/dL — ABNORMAL HIGH (ref 70–99)
Potassium: 3.8 mmol/L (ref 3.5–5.1)
Sodium: 136 mmol/L (ref 135–145)

## 2021-02-18 SURGERY — IRRIGATION AND DEBRIDEMENT EXTREMITY
Anesthesia: General | Laterality: Left

## 2021-02-18 MED ORDER — CEFAZOLIN SODIUM-DEXTROSE 1-4 GM/50ML-% IV SOLN
1.0000 g | Freq: Four times a day (QID) | INTRAVENOUS | Status: AC
Start: 2021-02-18 — End: 2021-02-19
  Administered 2021-02-18 – 2021-02-19 (×2): 1 g via INTRAVENOUS
  Filled 2021-02-18 (×2): qty 50

## 2021-02-18 MED ORDER — ONDANSETRON HCL 4 MG/2ML IJ SOLN
INTRAMUSCULAR | Status: AC
  Filled 2021-02-18: qty 2

## 2021-02-18 MED ORDER — PROPOFOL 10 MG/ML IV BOLUS
INTRAVENOUS | Status: AC
  Filled 2021-02-18: qty 20

## 2021-02-18 MED ORDER — LACTATED RINGERS IV SOLN: INTRAVENOUS | Status: DC

## 2021-02-18 MED ORDER — ORAL CARE MOUTH RINSE: 15.0000 mL | Freq: Once | OROMUCOSAL | Status: AC

## 2021-02-18 MED ORDER — OXYCODONE-ACETAMINOPHEN 5-325 MG PO TABS
1.0000 | ORAL_TABLET | ORAL | Status: DC | PRN
  Administered 2021-02-18 – 2021-02-19 (×2): 1 via ORAL
  Filled 2021-02-18 (×2): qty 1

## 2021-02-18 MED ORDER — SODIUM CHLORIDE 0.9 % IV SOLN: INTRAVENOUS | Status: DC

## 2021-02-18 MED ORDER — MORPHINE SULFATE (PF) 2 MG/ML IV SOLN
0.5000 mg | INTRAVENOUS | Status: DC | PRN
  Administered 2021-02-19: 1 mg via INTRAVENOUS
  Filled 2021-02-18: qty 1

## 2021-02-18 MED ORDER — ONDANSETRON HCL 4 MG/2ML IJ SOLN: 4.0000 mg | Freq: Four times a day (QID) | INTRAMUSCULAR | Status: DC | PRN

## 2021-02-18 MED ORDER — DOCUSATE SODIUM 100 MG PO CAPS
100.0000 mg | ORAL_CAPSULE | Freq: Two times a day (BID) | ORAL | Status: DC
  Administered 2021-02-18 – 2021-02-19 (×3): 100 mg via ORAL
  Filled 2021-02-18 (×3): qty 1

## 2021-02-18 MED ORDER — CEFAZOLIN SODIUM-DEXTROSE 2-4 GM/100ML-% IV SOLN
2.0000 g | Freq: Once | INTRAVENOUS | Status: AC
  Administered 2021-02-18: 2 g via INTRAVENOUS

## 2021-02-18 MED ORDER — ONDANSETRON HCL 4 MG/2ML IJ SOLN
INTRAMUSCULAR | Status: DC | PRN
  Administered 2021-02-18: 4 mg via INTRAVENOUS

## 2021-02-18 MED ORDER — LIDOCAINE 2% (20 MG/ML) 5 ML SYRINGE
INTRAMUSCULAR | Status: DC | PRN
  Administered 2021-02-18: 60 mg via INTRAVENOUS

## 2021-02-18 MED ORDER — METHOCARBAMOL 1000 MG/10ML IJ SOLN
500.0000 mg | Freq: Four times a day (QID) | INTRAVENOUS | Status: DC | PRN
  Filled 2021-02-18: qty 5

## 2021-02-18 MED ORDER — PROPOFOL 10 MG/ML IV BOLUS
INTRAVENOUS | Status: DC | PRN
  Administered 2021-02-18: 100 mg via INTRAVENOUS

## 2021-02-18 MED ORDER — METHOCARBAMOL 500 MG PO TABS
500.0000 mg | ORAL_TABLET | Freq: Four times a day (QID) | ORAL | Status: DC | PRN
  Administered 2021-02-20: 500 mg via ORAL
  Filled 2021-02-18: qty 1

## 2021-02-18 MED ORDER — ONDANSETRON HCL 4 MG PO TABS: 4.0000 mg | ORAL_TABLET | Freq: Four times a day (QID) | ORAL | Status: DC | PRN

## 2021-02-18 MED ORDER — POLYETHYLENE GLYCOL 3350 17 G PO PACK: 17.0000 g | PACK | Freq: Every day | ORAL | Status: DC | PRN

## 2021-02-18 MED ORDER — PHENYLEPHRINE HCL-NACL 20-0.9 MG/250ML-% IV SOLN
INTRAVENOUS | Status: DC | PRN
  Administered 2021-02-18: 30 ug/min via INTRAVENOUS

## 2021-02-18 MED ORDER — HYDROCODONE-ACETAMINOPHEN 5-325 MG PO TABS: 1.0000 | ORAL_TABLET | ORAL | Status: DC | PRN

## 2021-02-18 MED ORDER — 0.9 % SODIUM CHLORIDE (POUR BTL) OPTIME
TOPICAL | Status: DC | PRN
  Administered 2021-02-18: 1000 mL

## 2021-02-18 MED ORDER — FENTANYL CITRATE (PF) 250 MCG/5ML IJ SOLN
INTRAMUSCULAR | Status: AC
  Filled 2021-02-18: qty 5

## 2021-02-18 MED ORDER — ACETAMINOPHEN 325 MG PO TABS
325.0000 mg | ORAL_TABLET | Freq: Four times a day (QID) | ORAL | Status: DC | PRN
  Administered 2021-02-20 – 2021-02-21 (×3): 650 mg via ORAL
  Filled 2021-02-18 (×4): qty 2

## 2021-02-18 MED ORDER — ACETAMINOPHEN 500 MG PO TABS: 1000.0000 mg | ORAL_TABLET | Freq: Once | ORAL | Status: AC

## 2021-02-18 MED ORDER — BISACODYL 10 MG RE SUPP: 10.0000 mg | Freq: Every day | RECTAL | Status: DC | PRN

## 2021-02-18 MED ORDER — CHLORHEXIDINE GLUCONATE 0.12 % MT SOLN: 15.0000 mL | Freq: Once | OROMUCOSAL | Status: AC

## 2021-02-18 MED ORDER — HYDROCODONE-ACETAMINOPHEN 7.5-325 MG PO TABS: 1.0000 | ORAL_TABLET | ORAL | Status: DC | PRN

## 2021-02-18 MED ORDER — CHLORHEXIDINE GLUCONATE 0.12 % MT SOLN
OROMUCOSAL | Status: AC
  Administered 2021-02-18: 15 mL via OROMUCOSAL
  Filled 2021-02-18: qty 15

## 2021-02-18 MED ORDER — METOCLOPRAMIDE HCL 5 MG/ML IJ SOLN: 5.0000 mg | Freq: Three times a day (TID) | INTRAMUSCULAR | Status: DC | PRN

## 2021-02-18 MED ORDER — METOCLOPRAMIDE HCL 5 MG PO TABS: 5.0000 mg | ORAL_TABLET | Freq: Three times a day (TID) | ORAL | Status: DC | PRN

## 2021-02-18 MED ORDER — LIDOCAINE 2% (20 MG/ML) 5 ML SYRINGE
INTRAMUSCULAR | Status: AC
  Filled 2021-02-18: qty 5

## 2021-02-18 MED ORDER — PHENYLEPHRINE HCL (PRESSORS) 10 MG/ML IV SOLN
INTRAVENOUS | Status: DC | PRN
  Administered 2021-02-18: 80 ug via INTRAVENOUS

## 2021-02-18 MED ORDER — ACETAMINOPHEN 500 MG PO TABS
ORAL_TABLET | ORAL | Status: AC
  Administered 2021-02-18: 1000 mg via ORAL
  Filled 2021-02-18: qty 2

## 2021-02-18 MED ORDER — FENTANYL CITRATE (PF) 250 MCG/5ML IJ SOLN
INTRAMUSCULAR | Status: DC | PRN
  Administered 2021-02-18: 50 ug via INTRAVENOUS

## 2021-02-18 SURGICAL SUPPLY — 33 items
BAG COUNTER SPONGE SURGICOUNT (BAG) IMPLANT
BLADE SURG 21 STRL SS (BLADE) ×2 IMPLANT
BNDG COHESIVE 6X5 TAN STRL LF (GAUZE/BANDAGES/DRESSINGS) IMPLANT
BNDG GAUZE ELAST 4 BULKY (GAUZE/BANDAGES/DRESSINGS) ×4 IMPLANT
COVER SURGICAL LIGHT HANDLE (MISCELLANEOUS) ×4 IMPLANT
DRAPE U-SHAPE 47X51 STRL (DRAPES) ×2 IMPLANT
DRESSING PREVENA PLUS CUSTOM (GAUZE/BANDAGES/DRESSINGS) ×1 IMPLANT
DRSG ADAPTIC 3X8 NADH LF (GAUZE/BANDAGES/DRESSINGS) ×2 IMPLANT
DRSG PREVENA PLUS CUSTOM (GAUZE/BANDAGES/DRESSINGS) ×2
DURAPREP 26ML APPLICATOR (WOUND CARE) ×2 IMPLANT
ELECT REM PT RETURN 9FT ADLT (ELECTROSURGICAL)
ELECTRODE REM PT RTRN 9FT ADLT (ELECTROSURGICAL) IMPLANT
GAUZE SPONGE 4X4 12PLY STRL (GAUZE/BANDAGES/DRESSINGS) ×2 IMPLANT
GLOVE SURG ORTHO LTX SZ9 (GLOVE) ×2 IMPLANT
GLOVE SURG UNDER POLY LF SZ9 (GLOVE) ×2 IMPLANT
GOWN STRL REUS W/ TWL XL LVL3 (GOWN DISPOSABLE) ×2 IMPLANT
GOWN STRL REUS W/TWL XL LVL3 (GOWN DISPOSABLE) ×4
HANDPIECE INTERPULSE COAX TIP (DISPOSABLE)
KIT BASIN OR (CUSTOM PROCEDURE TRAY) ×2 IMPLANT
KIT TURNOVER KIT B (KITS) ×2 IMPLANT
MANIFOLD NEPTUNE II (INSTRUMENTS) ×2 IMPLANT
NS IRRIG 1000ML POUR BTL (IV SOLUTION) ×2 IMPLANT
PACK ORTHO EXTREMITY (CUSTOM PROCEDURE TRAY) ×2 IMPLANT
PAD ARMBOARD 7.5X6 YLW CONV (MISCELLANEOUS) ×4 IMPLANT
SET HNDPC FAN SPRY TIP SCT (DISPOSABLE) IMPLANT
STAPLER VISISTAT 35W (STAPLE) ×2 IMPLANT
STOCKINETTE IMPERVIOUS 9X36 MD (GAUZE/BANDAGES/DRESSINGS) IMPLANT
SUT ETHILON 2 0 PSLX (SUTURE) ×4 IMPLANT
SWAB COLLECTION DEVICE MRSA (MISCELLANEOUS) ×2 IMPLANT
SWAB CULTURE ESWAB REG 1ML (MISCELLANEOUS) IMPLANT
TOWEL GREEN STERILE (TOWEL DISPOSABLE) ×2 IMPLANT
TUBE CONNECTING 12X1/4 (SUCTIONS) ×2 IMPLANT
YANKAUER SUCT BULB TIP NO VENT (SUCTIONS) ×2 IMPLANT

## 2021-02-18 NOTE — Op Note (Addendum)
02/18/2021  6:12 PM  PATIENT:  Tom Norton    PRE-OPERATIVE DIAGNOSIS:  Left Knee Wound  POST-OPERATIVE DIAGNOSIS:  Same  PROCEDURE:  IRRIGATION AND DEBRIDEMENT KNEE Local tissue rearrangement for wound closure 20 x 15 cm. Application of customizable wound VAC.  SURGEON:  Newt Minion, MD  PHYSICIAN ASSISTANT:None ANESTHESIA:   General  PREOPERATIVE INDICATIONS:  Tom Norton is a  87 y.o. male with a diagnosis of Left Knee Wound who failed conservative measures and elected for surgical management.    The risks benefits and alternatives were discussed with the patient preoperatively including but not limited to the risks of infection, bleeding, nerve injury, cardiopulmonary complications, the need for revision surgery, among others, and the patient was willing to proceed.  OPERATIVE IMPLANTS: Customizable wound VAC.  @ENCIMAGES @  OPERATIVE FINDINGS: The total knee arthroplasty was not exposed.  Tissue margins were healthy and viable after debridement.  OPERATIVE PROCEDURE: Patient was brought the operating room and underwent a general anesthetic.  After adequate levels anesthesia were obtained patient's left lower extremity was prepped using DuraPrep draped into a sterile field a timeout was called.  A elliptical incision was made around the necrotic ulcerative tissue.  This left a wound that was 20 x 15 cm.  A rondure was used to further debride the patella patella tendon and quad tendon back to healthy viable tissue.  All tissue margins were healthy and viable.  The wound was irrigated with normal saline there was no exposed joint.  Local tissue rearrangement was used to close the wound 20 x 15 cm.  This was covered with a Prevena wound VAC this had a good suction fit patient was placed in a knee immobilizer extubated taken the PACU in stable condition.  Debridement type: Excisional Debridement  Side: left  Body Location: knee   Tools used for debridement: scalpel and  rongeur  Pre-debridement Wound size (cm):   Length: 5        Width: 5     Depth: 1   Post-debridement Wound size (cm):   Length: 20        Width: 15     Depth: 1   Debridement depth beyond dead/damaged tissue down to healthy viable tissue: yes  Tissue layer involved: skin, subcutaneous tissue, muscle / fascia, bone  Nature of tissue removed: Devitalized Tissue and Non-viable tissue  Irrigation volume: 3 liters     Irrigation fluid type: Normal Saline     DISCHARGE PLANNING:  Antibiotic duration: 24 hours antibiotics  Weightbearing: Weightbearing as tolerated with a knee immobilizer  Pain medication: Opioid pathway  Dressing care/ Wound VAC: Continue wound VAC for 1 week  Ambulatory devices: Walker  Discharge to: Discharge planning based on therapy recommendations.  Follow-up: In the office 1 week post operative.

## 2021-02-18 NOTE — Transfer of Care (Signed)
Immediate Anesthesia Transfer of Care Note  Patient: Tom Norton  Procedure(s) Performed: IRRIGATION AND DEBRIDEMENT KNEE (Left)  Patient Location: PACU  Anesthesia Type:General  Level of Consciousness: awake, alert  and patient cooperative  Airway & Oxygen Therapy: Patient Spontanous Breathing and Patient connected to nasal cannula oxygen  Post-op Assessment: Report given to RN, Post -op Vital signs reviewed and stable and Patient moving all extremities X 4  Post vital signs: Reviewed and stable  Last Vitals:  Vitals Value Taken Time  BP 142/83 02/18/21 1737  Temp    Pulse 85 02/18/21 1738  Resp 25 02/18/21 1738  SpO2 100 % 02/18/21 1738  Vitals shown include unvalidated device data.  Last Pain:  Vitals:   02/18/21 1621  TempSrc:   PainSc: 0-No pain      Patients Stated Pain Goal: 0 (94/70/76 1518)  Complications: No notable events documented.

## 2021-02-18 NOTE — Anesthesia Preprocedure Evaluation (Addendum)
Anesthesia Evaluation  Patient identified by MRN, date of birth, ID band Patient awake    Reviewed: Allergy & Precautions, NPO status , Patient's Chart, lab work & pertinent test results  Airway Mallampati: III  TM Distance: >3 FB Neck ROM: Full    Dental no notable dental hx. (+) Teeth Intact, Dental Advisory Given   Pulmonary neg pulmonary ROS,    Pulmonary exam normal breath sounds clear to auscultation       Cardiovascular hypertension, Pt. on medications Normal cardiovascular exam+ dysrhythmias Atrial Fibrillation  Rhythm:Regular Rate:Normal  TTE 2017 Left ventricle: The cavity size was normal. Wall thickness was  normal. Systolic function was normal. The estimated ejection  fraction was in the range of 55% to 60%.  - Aortic valve: There was mild regurgitation.  - Aorta: Ascending aortic root moderately dilated.  - Left atrium: The atrium was moderately dilated.  - Right atrium: The atrium was moderately dilated.  - Atrial septum: No defect or patent foramen ovale was identified    Neuro/Psych negative neurological ROS  negative psych ROS   GI/Hepatic Neg liver ROS, hiatal hernia, GERD  ,  Endo/Other  Hypothyroidism   Renal/GU negative Renal ROS  negative genitourinary   Musculoskeletal negative musculoskeletal ROS (+)   Abdominal   Peds  Hematology  (+) Blood dyscrasia (on eliquis), ,   Anesthesia Other Findings   Reproductive/Obstetrics                            Anesthesia Physical Anesthesia Plan  ASA: 3  Anesthesia Plan: General   Post-op Pain Management:    Induction: Intravenous  PONV Risk Score and Plan: 2 and Ondansetron, Dexamethasone and Midazolam  Airway Management Planned: LMA  Additional Equipment:   Intra-op Plan:   Post-operative Plan: Extubation in OR  Informed Consent: I have reviewed the patients History and Physical, chart, labs and discussed  the procedure including the risks, benefits and alternatives for the proposed anesthesia with the patient or authorized representative who has indicated his/her understanding and acceptance.     Dental advisory given  Plan Discussed with: CRNA  Anesthesia Plan Comments:         Anesthesia Quick Evaluation

## 2021-02-18 NOTE — Progress Notes (Signed)
Mobility Specialist Progress Note   02/18/21 1000  Mobility  Activity Contraindicated/medical hold   Per request of RN d/t scheduled surgery in the afternoon and pain lvl of pt.  Holland Falling Mobility Specialist Phone Number [phone]

## 2021-02-18 NOTE — Progress Notes (Signed)
Orthopedic Tech Progress Note Patient Details:  Tom Norton [birth year] [id number] Patient received K.I. after procedure  Patient ID: [name], male   DOB: Nov 18, 1929, 87 y.o.   MRN: [id number]  Ellouise Newer 02/18/2021, 10:14 PM

## 2021-02-18 NOTE — Plan of Care (Signed)

## 2021-02-18 NOTE — TOC CAGE-AID Note (Signed)
Transition of Care [facility]) - CAGE-AID Screening   Patient Details  Name: Tom Norton MRN: [id number] Date of Birth: 1930-03-10  Transition of Care [facility]) CM/SW Contact:    Nohelia Valenza C Tarpley-Carter, LCSWA Phone Number: 02/18/2021, 10:21 AM   Clinical Narrative: Pt participated in [place].  Pt stated he does not use substance or ETOH.  Pt was not offered resources, due to no usage of substance or ETOH.    Zayin Valadez Tarpley-Carter, MSW, LCSW-A Pronouns:  She/Her/Hers [facility]itions of Care Clinical Social Worker Direct Number:  [phone] [email or web address]     CAGE-AID Screening:    Have You Ever Felt You Ought to Cut Down on Your Drinking or Drug Use?: No Have People Annoyed You By [employer] Your Drinking Or Drug Use?: No Have You Felt Bad Or Guilty About Your Drinking Or Drug Use?: No Have You Ever Had a Drink or Used Drugs First Thing In The Morning to Steady Your Nerves or to Get Rid of a Hangover?: No CAGE-AID Score: 0  Substance Abuse Education Offered: No
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
## 2021-02-19 DIAGNOSIS — S81002A Unspecified open wound, left knee, initial encounter: Secondary | ICD-10-CM | POA: Diagnosis not present

## 2021-02-19 MED ORDER — APIXABAN 5 MG PO TABS
5.0000 mg | ORAL_TABLET | Freq: Two times a day (BID) | ORAL | Status: DC
  Administered 2021-02-19 – 2021-02-22 (×7): 5 mg via ORAL
  Filled 2021-02-19 (×7): qty 1

## 2021-02-19 NOTE — TOC Initial Note (Signed)
Transition of Care [facility]) - Initial/Assessment Note    Patient Details  Name: Tom Norton MRN: [id number] Date of Birth: [birth year]  Transition of Care [facility]) CM/SW Contact:    Tom Chars, LCSW Phone Number: 02/19/2021, 3:07 PM  Clinical Narrative:  CSW spoke with pt and [name] regarding discharge plan.  Permission given to speak with wife present.  Discussed CIR as current primary plan, discussed SNF as backup plan.  They are agreeable, they live close to [facility] and this would be their first choice.  Wife reiterates desire for CIR if available.  Choice document given.  Permission given to send out referral in hub.  Current DME in home: walker, rollator, shower chair, raised toilet seat.  Pt is vaccinated for covid with one booster.                   Expected Discharge Plan: IP Rehab Facility Barriers to Discharge: Continued Medical Work up, SNF Pending bed offer   Patient Goals and CMS Choice Patient states their goals for this hospitalization and ongoing recovery are:: be active again CMS Medicare.gov Compare Post Acute Care list provided to:: Patient Represenative (must comment) Choice offered to / list presented to : Spouse  Expected Discharge Plan and Services Expected Discharge Plan: [place] In-house Referral: Clinical Social Work   Post Acute Care Choice: IP Rehab Living arrangements for the past 2 months: [place]                                      Prior Living Arrangements/Services Living arrangements for the past 2 months: Single Family Home Lives with:: Spouse Patient language and need for interpreter reviewed:: Yes Do you feel safe going back to the place where you live?: Yes      Need for Family Participation in Patient Care: Yes (Comment) Care giver support system in place?: Yes (comment) Current home services: Other (comment) (none) Criminal Activity/Legal Involvement Pertinent to Current Situation/Hospitalization:  No - Comment as needed  Activities of Daily Living      Permission Sought/Granted Permission sought to share information with : Family Supports Permission granted to share information with : Yes, Verbal Permission Granted  Share Information with NAME: [name]  Permission granted to share info w AGENCY: SNF        Emotional Assessment Appearance:: Appears stated age Attitude/Demeanor/Rapport: Engaged Affect (typically observed): Appropriate, Pleasant Orientation: : Oriented to Self, Oriented to Place, Oriented to  Time, Oriented to Situation Alcohol / Substance Use: Not Applicable Psych Involvement: No (comment)  Admission diagnosis:  Fall, initial encounter [W19.XXXA] Open knee wound, left, initial encounter [S81.002A] Knee laceration, left, initial encounter [S81.012A] Closed fracture of one rib of left side, initial encounter [S22.32XA] Patient Active Problem List   Diagnosis Date Noted   Fall    Open knee wound, left, initial encounter 02/17/2021   Rib fracture 02/17/2021   Intestinal obstruction without gangrene due to recurrent left inguinal hernia 05/02/2019   Dehydration 05/02/2019   Essential hypertension 05/02/2019   Dyslipidemia 05/02/2019   Hyperglycemia 05/02/2019   Primary osteoarthritis of left knee 07/20/2017   S/P knee replacement 07/20/2017   Peripheral neuropathy 07/17/2017   Paroxysmal atrial fibrillation ([place]) 10/31/2015   Hypothyroidism 10/31/2015   Dilated aortic root ([place]) 10/08/2014   Tick bite of right thigh 08/18/2014   Rash 12/10/2013   Hypotension 09/23/2013  Renal insufficiency 09/23/2013   PCP:  Josetta Huddle, MD Pharmacy:   [facility] [place] [place], [address] AT [address] [place] [address] Phone: [phone] Fax: [phone]  [place], [address] [place] [address] Phone:  [phone] Fax: [phone]  [place], [address] [place] [address] Phone: [phone] Fax: [phone]     Social Determinants of Health (SDOH) Interventions    Readmission Risk Interventions No flowsheet data found.

## 2021-02-19 NOTE — Evaluation (Signed)
Physical Therapy Evaluation Patient Details Name: Tom Norton MRN: [id number] DOB: 04-08-1930 Today's Date: 02/19/2021  History of Present Illness  87 y.o. male hx HTN, paroxysmal A. fib, SBO, hypothyroid. S/p I&D of left knee following degloving accident. 9th rib fracture.  Clinical Impression  Pt admitted with above complications, s/p I&D of Lt knee. Requires mod assist to safely transfer sit<>stand, min assist to ambulate short distances in room while wearing Lt knee immobilizer to prevent stress/dehiscence of surgical repair. Wife unable to provide heavy physical assistance at home. Feel patient would tolerate CIR well and quickly progress back to PLOF with some short term intensive rehab, as he was highly independent prior to injury. If he does not qualify or beds not readily available to start rehab quickly, recommend SNF to improve functional independence with transfers and gait. Pt currently with functional limitations due to the deficits listed below (see PT Problem List). Pt will benefit from skilled PT to increase their independence and safety with mobility to allow discharge to the venue listed below.          Recommendations for follow up therapy are one component of a multi-disciplinary discharge planning process, led by the attending physician.  Recommendations may be updated based on patient status, additional functional criteria and insurance authorization.  Follow Up Recommendations Acute inpatient rehab (3hours/day)    Assistance Recommended at Discharge  Intermittent supervision and physical assistance.  Functional Status Assessment Patient has had a recent decline in their functional status and demonstrates the ability to make significant improvements in function in a reasonable and predictable amount of time.  Equipment Recommendations  None recommended by PT (TBD next venue of care)    Recommendations for Other Services OT consult     Precautions / Restrictions  Precautions Precautions: Fall;Other (comment) (Per ortho "Keep knee straight at all times." Left) Precaution Comments: Keep left knee straight at all times Required Braces or Orthoses: Knee Immobilizer - Left Knee Immobilizer - Left: On except when in CPM;Other (comment) (Order - CPM or PT. Ortho Note = keep knee straight at all times) Restrictions Weight Bearing Restrictions: Yes LLE Weight Bearing: Weight bearing as tolerated Other Position/Activity Restrictions: Avoid knee flexion until cleared by ortho      Mobility  Bed Mobility               General bed mobility comments: In recliner when PT entered room    Transfers Overall transfer level: Needs assistance Equipment used: Rolling walker (2 wheels) Transfers: Sit to/from Stand Sit to Stand: Mod assist           General transfer comment: Mod assist for boost to stand and to scoot to edge of chair. Demonstrates notable weakness and difficulty due to Lt knee immobilization. Cues for technique, hand and foot placement. Performed x2 from recliner.    Ambulation/Gait Ambulation/Gait assistance: Min assist Gait Distance (Feet): 25 Feet Assistive device: Rolling walker (2 wheels) Gait Pattern/deviations: Step-to pattern;Decreased stride length;Decreased weight shift to left;Antalgic Gait velocity: slow   General Gait Details: Amb with min assist around room, tolerating WB through LLE fairly, using UEs appropriately after education. Cues for sequencing and RW proximity. Assisted minimally with RW control initially and with IV and wound VAC. knee immobilizer in place to prevent knee flexion on Lt.  Stairs            Wheelchair Mobility    Modified Rankin (Stroke Patients Only)       Balance Overall balance assessment: Needs  assistance Sitting-balance support: No upper extremity supported;Feet supported Sitting balance-Leahy Scale: Good     Standing balance support: Single extremity supported;During  functional activity Standing balance-Leahy Scale: Fair Standing balance comment: Incontinent upon standing - assisted with urinal, held RW briefly before PT able to assist further while balancing and managing urinal.                             Pertinent Vitals/Pain Pain Assessment: 0-10 Pain Score: 4  Pain Location: left knee Pain Descriptors / Indicators: Aching Pain Intervention(s): Limited activity within patient's tolerance;Monitored during session;Repositioned    Home Living Family/patient expects to be discharged to:: Private residence Living Arrangements: Spouse/significant other Available Help at Discharge: Family;Available 24 hours/day Type of Home: House Home Access: Stairs to enter Entrance Stairs-Rails: Right;Left Entrance Stairs-Number of Steps: 4 Alternate Level Stairs-Number of Steps: flight Home Layout: Multi-level;Able to live on main level with bedroom/bathroom Home Equipment: Rolling Walker (2 wheels);BSC;Shower seat - built in (upright walker)      Prior Function Prior Level of Function : Independent/Modified Independent             Mobility Comments: ind drives       Hand Dominance   Dominant Hand: Right    Extremity/Trunk Assessment   Upper Extremity Assessment Upper Extremity Assessment: Defer to OT evaluation    Lower Extremity Assessment Lower Extremity Assessment: LLE deficits/detail LLE Deficits / Details: Knee immobilizer LLE: Unable to fully assess due to immobilization       Communication   Communication: HOH  Cognition Arousal/Alertness: Awake/alert Behavior During Therapy: WFL for tasks assessed/performed Overall Cognitive Status: Within Functional Limits for tasks assessed                                          General Comments General comments (skin integrity, edema, etc.): Wife present. Educated on KI use, need to avoid knee flexion until cleared by physician. SpO2 93% on room air. back  onto 2L supplemetal O2 as found at start of therapy.    Exercises General Exercises - Lower Extremity Ankle Circles/Pumps: AROM;Both;15 reps;Seated Quad Sets: Both;10 reps;Seated;Strengthening Gluteal Sets: Strengthening;5 reps;Both;Seated   Assessment/Plan    PT Assessment Patient needs continued PT services  PT Problem List Decreased strength;Decreased range of motion;Decreased activity tolerance;Decreased balance;Decreased mobility;Decreased knowledge of use of DME;Decreased knowledge of precautions;Pain       PT Treatment Interventions DME instruction;Gait training;Functional mobility training;Therapeutic activities;Therapeutic exercise;Balance training;Neuromuscular re-education;Patient/family education    PT Goals (Current goals can be found in the Care Plan section)  Acute Rehab PT Goals Patient Stated Goal: Get well and go home PT Goal Formulation: With patient/family Time For Goal Achievement: 03/05/21 Potential to Achieve Goals: Good    Frequency Min 3X/week   Barriers to discharge Decreased caregiver support Will require physical assist for a while, wife cannot provide    Co-evaluation               AM-PAC PT "6 Clicks" Mobility  Outcome Measure Help needed turning from your back to your side while in a flat bed without using bedrails?: A Little Help needed moving from lying on your back to sitting on the side of a flat bed without using bedrails?: A Little Help needed moving to and from a bed to a chair (including a wheelchair)?: A Lot Help needed  standing up from a chair using your arms (e.g., wheelchair or bedside chair)?: A Lot Help needed to walk in hospital room?: A Little Help needed climbing 3-5 steps with a railing? : Total 6 Click Score: 14    End of Session Equipment Utilized During Treatment: Gait belt Activity Tolerance: Patient tolerated treatment well Patient left: in chair;with call bell/phone within reach;with family/visitor present;with  SCD's reapplied (Pt in chair start of therapy, returned to previous position.) Nurse Communication: Mobility status PT Visit Diagnosis: Unsteadiness on feet (R26.81);Other abnormalities of gait and mobility (R26.89);Muscle weakness (generalized) (M62.81);Difficulty in walking, not elsewhere classified (R26.2);Pain Pain - Right/Left: Left Pain - part of body: Knee    Time: 1124-1200 PT Time Calculation (min) (ACUTE ONLY): 36 min   Charges:   PT Evaluation $PT Eval Low Complexity: 1 Low PT Treatments $Therapeutic Activity: 8-22 mins       Elayne Snare, PT, DPT  Ellouise Newer 02/19/2021, 12:14 PM
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
## 2021-02-19 NOTE — Progress Notes (Addendum)
SATURATION QUALIFICATIONS: (This note is used to comply with regulatory documentation for home oxygen)  Patient Saturations on Room Air at Rest = 96%  Patient Saturations on Room Air while Ambulating = 94%  Patient Saturations on 0 Liters of oxygen while Ambulating = 94%  Please briefly explain why patient needs home oxygen:  Pt doesn't need home oxygen nor he's been using oxygen prior to admission.

## 2021-02-19 NOTE — NC FL2 (Signed)
[place] LEVEL OF CARE SCREENING TOOL     IDENTIFICATION  Patient Name: Tom Norton Birthdate: [birth year] Sex: male Admission Date (Current Location): 02/17/2021  [facility] and [place] Number:  Herbalist and Address:  [facility] [place], [place] [address] [place], [place] [address]      Provider Number: [id number]  Attending Physician Name and Address:  Nita Sells, MD  Relative Name and Phone Number:  Olamide, Carattini [phone]  [phone]    Current Level of Care: Hospital Recommended Level of Care: [place] Prior Approval Number:    Date Approved/Denied:   PASRR Number: [id number]  Discharge Plan: SNF    Current Diagnoses: Patient Active Problem List   Diagnosis Date Noted   Fall    Open knee wound, left, initial encounter 02/17/2021   Rib fracture 02/17/2021   Intestinal obstruction without gangrene due to recurrent left inguinal hernia 05/02/2019   Dehydration 05/02/2019   Essential hypertension 05/02/2019   Dyslipidemia 05/02/2019   Hyperglycemia 05/02/2019   Primary osteoarthritis of left knee 07/20/2017   S/P knee replacement 07/20/2017   Peripheral neuropathy 07/17/2017   Paroxysmal atrial fibrillation ([place]) 10/31/2015   Hypothyroidism 10/31/2015   Dilated aortic root ([place]) 10/08/2014   Tick bite of right thigh 08/18/2014   Rash 12/10/2013   Hypotension 09/23/2013   Renal insufficiency 09/23/2013    Orientation RESPIRATION BLADDER Height & Weight     Self, Time, Situation, Place  Normal Continent Weight:   Height:     BEHAVIORAL SYMPTOMS/MOOD NEUROLOGICAL BOWEL NUTRITION STATUS      Continent Diet (see discharge summary)  AMBULATORY STATUS COMMUNICATION OF NEEDS Skin   Limited Assist Verbally Surgical wounds                       Personal Care Assistance Level of Assistance  Bathing, Feeding, Dressing Bathing Assistance: Limited assistance Feeding assistance:  Independent Dressing Assistance: Limited assistance     Functional Limitations Info  Sight, Hearing, Speech Sight Info: Adequate Hearing Info: Impaired Speech Info: Adequate    SPECIAL CARE FACTORS FREQUENCY  PT (By licensed PT), OT (By licensed OT)     PT Frequency: 5x week OT Frequency: 5x week            Contractures Contractures Info: Not present    Additional Factors Info  Code Status, Allergies Code Status Info: full Allergies Info: Doxycycline Hyclate, Other           Current Medications (02/19/2021):  This is the current hospital active medication list Current Facility-Administered Medications  Medication Dose Route Frequency Provider Last Rate Last Admin   0.9 %  sodium chloride infusion   Intravenous Continuous Newt Minion, MD 10 mL/hr at 02/18/21 2103 New Bag at 02/18/21 2103   acetaminophen (TYLENOL) tablet 325-650 mg  325-650 mg Oral Q6H PRN Newt Minion, MD       apixaban Arne [place]) tablet 5 mg  5 mg Oral BID Nita Sells, MD   5 mg at 02/19/21 1228   bisacodyl (DULCOLAX) suppository 10 mg  10 mg Rectal Daily PRN Newt Minion, MD       ceFAZolin (ANCEF) IVPB 1 g/50 mL premix  1 g Intravenous Q6H Newt Minion, MD 100 mL/hr at 02/19/21 1031 1 g at 02/19/21 1031   cholecalciferol (VITAMIN D3) tablet 2,000 Units  2,000 Units Oral Daily Newt Minion, MD   2,000 Units at 02/19/21 0830  docusate sodium (COLACE) capsule 100 mg  100 mg Oral BID Newt Minion, MD   100 mg at 02/19/21 0839   HYDROcodone-acetaminophen (NORCO) 7.5-325 MG per tablet 1-2 tablet  1-2 tablet Oral Q4H PRN Newt Minion, MD       HYDROcodone-acetaminophen (NORCO/VICODIN) 5-325 MG per tablet 1-2 tablet  1-2 tablet Oral Q4H PRN Newt Minion, MD       levothyroxine (SYNTHROID) tablet 25 mcg  25 mcg Oral QAC breakfast Newt Minion, MD   25 mcg at 02/18/21 0640   lidocaine (LIDODERM) 5 % 1 patch  1 patch Transdermal Q24H Newt Minion, MD   1 patch at 02/17/21 1829    methocarbamol (ROBAXIN) tablet 500 mg  500 mg Oral Q6H PRN Newt Minion, MD       Or   methocarbamol (ROBAXIN) 500 mg in dextrose 5 % 50 mL IVPB  500 mg Intravenous Q6H PRN Newt Minion, MD       metoCLOPramide (REGLAN) tablet 5-10 mg  5-10 mg Oral Q8H PRN Newt Minion, MD       Or   metoCLOPramide (REGLAN) injection 5-10 mg  5-10 mg Intravenous Q8H PRN Newt Minion, MD       morphine 2 MG/ML injection 0.5-1 mg  0.5-1 mg Intravenous Q2H PRN Newt Minion, MD   1 mg at 02/19/21 0138   multivitamin with minerals tablet 1 tablet  1 tablet Oral Daily Newt Minion, MD   1 tablet at 02/19/21 0830   ondansetron (ZOFRAN) tablet 4 mg  4 mg Oral Q6H PRN Newt Minion, MD       Or   ondansetron [facility]) injection 4 mg  4 mg Intravenous Q6H PRN Newt Minion, MD       oxyCODONE-acetaminophen (PERCOCET/ROXICET) 5-325 MG per tablet 1 tablet  1 tablet Oral Q4H PRN Newt Minion, MD   1 tablet at 02/19/21 1449   polyethylene glycol (MIRALAX / GLYCOLAX) packet 17 g  17 g Oral Daily PRN Newt Minion, MD       tamsulosin [facility]) capsule 0.4 mg  0.4 mg Oral QPC supper Newt Minion, MD       zolpidem [facility]) tablet 5 mg  5 mg Oral QHS PRN Newt Minion, MD   5 mg at 02/19/21 0138     Discharge Medications: Please see discharge summary for a list of discharge medications.  Relevant Imaging Results:  Relevant Lab Results:   Additional Information SSN: [id number].  Pt is vaccinated for covid with one booster.  Joanne Chars, LCSW

## 2021-02-19 NOTE — Progress Notes (Signed)
Patient ID: Tom Norton, male   DOB: 04-Dec-1929, 87 y.o.   MRN: [id number] Patient is postoperative day 1 debridement and closure of traumatic abrasion left knee.  There was no gross infection, tissue and margins were clear after surgery.  There is no exposed joint.  Anticipate patient can discharge with the portable Praveena wound VAC pump with the knee immobilizer when he is safe with therapy.  Discussed the importance of keeping the knee straight at all times to minimize stress on the incision.  Patient may require discharge to skilled nursing.  Patient does not have around-the-clock care at home.

## 2021-02-19 NOTE — Progress Notes (Addendum)
Inpatient Rehab Admissions Coordinator:   Per therapy recommendations,  patient was screened for CIR candidacy by Clemens Catholic, MS, CCC-SLP . At this time, Pt. Appears to be a a potential candidate for CIR. I will place  order for rehab consult per protocol for full assessment. However, CIR beds are limited ( I have no beds until Tuesday at the earliest), so if rehab is desired prior to that, TOC may want to consider other venues. Please contact me any with questions.   Clemens Catholic, [place], [place] Admissions Coordinator  [phone] ([place]) [phone] (office)

## 2021-02-19 NOTE — Anesthesia Postprocedure Evaluation (Signed)
Anesthesia Post Note  Patient: Tom Norton  Procedure(s) Performed: IRRIGATION AND DEBRIDEMENT KNEE (Left)     Patient location during evaluation: PACU Anesthesia Type: General Level of consciousness: awake and alert Pain management: pain level controlled Vital Signs Assessment: post-procedure vital signs reviewed and stable Respiratory status: spontaneous breathing, nonlabored ventilation, respiratory function stable and patient connected to nasal cannula oxygen Cardiovascular status: blood pressure returned to baseline and stable Postop Assessment: no apparent nausea or vomiting Anesthetic complications: no   No notable events documented.  Last Vitals:  Vitals:   02/19/21 0103 02/19/21 0616  BP: 113/71 (!) 106/55  Pulse: 91 78  Resp:  16  Temp: 37.1 C 37.1 C  SpO2: 90% 92%    Last Pain:  Vitals:   02/19/21 0725  TempSrc:   PainSc: 0-No pain    LLE Motor Response: Purposeful movement (02/19/21 0725) LLE Sensation: Decreased (02/19/21 0725)          [place]

## 2021-02-20 ENCOUNTER — Encounter (HOSPITAL_COMMUNITY): Payer: Self-pay | Admitting: Orthopedic Surgery

## 2021-02-20 DIAGNOSIS — S81002A Unspecified open wound, left knee, initial encounter: Secondary | ICD-10-CM | POA: Diagnosis not present

## 2021-02-20 LAB — CBC WITH DIFFERENTIAL/PLATELET
Abs Immature Granulocytes: 0.02 10*3/uL (ref 0.00–0.07)
Basophils Absolute: 0.1 10*3/uL (ref 0.0–0.1)
Basophils Relative: 1 %
Eosinophils Absolute: 0.4 10*3/uL (ref 0.0–0.5)
Eosinophils Relative: 6 %
HCT: 41.3 % (ref 39.0–52.0)
Hemoglobin: 14.2 g/dL (ref 13.0–17.0)
Immature Granulocytes: 0 %
Lymphocytes Relative: 19 %
Lymphs Abs: 1.4 10*3/uL (ref 0.7–4.0)
MCH: 32.7 pg (ref 26.0–34.0)
MCHC: 34.4 g/dL (ref 30.0–36.0)
MCV: 95.2 fL (ref 80.0–100.0)
Monocytes Absolute: 1.1 10*3/uL — ABNORMAL HIGH (ref 0.1–1.0)
Monocytes Relative: 15 %
Neutro Abs: 4.4 10*3/uL (ref 1.7–7.7)
Neutrophils Relative %: 59 %
Platelets: 149 10*3/uL — ABNORMAL LOW (ref 150–400)
RBC: 4.34 MIL/uL (ref 4.22–5.81)
RDW: 12.8 % (ref 11.5–15.5)
WBC: 7.5 10*3/uL (ref 4.0–10.5)
nRBC: 0 % (ref 0.0–0.2)

## 2021-02-20 LAB — COMPREHENSIVE METABOLIC PANEL
ALT: 11 U/L (ref 0–44)
AST: 17 U/L (ref 15–41)
Albumin: 2.7 g/dL — ABNORMAL LOW (ref 3.5–5.0)
Alkaline Phosphatase: 53 U/L (ref 38–126)
Anion gap: 7 (ref 5–15)
BUN: 15 mg/dL (ref 8–23)
CO2: 23 mmol/L (ref 22–32)
Calcium: 8.9 mg/dL (ref 8.9–10.3)
Chloride: 106 mmol/L (ref 98–111)
Creatinine, Ser: 1.05 mg/dL (ref 0.61–1.24)
GFR, Estimated: >60 mL/min (ref >60)
Glucose, Bld: 120 mg/dL — ABNORMAL HIGH (ref 70–99)
Potassium: 3.7 mmol/L (ref 3.5–5.1)
Sodium: 136 mmol/L (ref 135–145)
Total Bilirubin: 0.9 mg/dL (ref 0.3–1.2)
Total Protein: 5.4 g/dL — ABNORMAL LOW (ref 6.5–8.1)

## 2021-02-20 MED ORDER — POLYETHYLENE GLYCOL 3350 17 G PO PACK
17.0000 g | PACK | Freq: Every day | ORAL | Status: DC
  Administered 2021-02-20 – 2021-02-22 (×3): 17 g via ORAL
  Filled 2021-02-20 (×3): qty 1

## 2021-02-20 MED ORDER — SENNOSIDES-DOCUSATE SODIUM 8.6-50 MG PO TABS
1.0000 | ORAL_TABLET | Freq: Once | ORAL | Status: AC
  Administered 2021-02-20: 1 via ORAL
  Filled 2021-02-20: qty 1
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
## 2021-02-21 DIAGNOSIS — S81002A Unspecified open wound, left knee, initial encounter: Secondary | ICD-10-CM | POA: Diagnosis not present
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
## 2021-02-21 NOTE — Progress Notes (Signed)
Inpatient Rehab Admissions Coordinator:   I do not have a bed for this Pt. Today on CIR but will follow for potential admit later this week pending bed availability.   Clemens Catholic, [place], [place] Admissions Coordinator  [phone] ([place]) [phone] (office)

## 2021-02-21 NOTE — Care Management Important Message (Signed)
Important Message  Patient Details  Name: KAHLEEL FADELEY MRN: [id number] Date of Birth: 07-25-1929   Medicare Important Message Given:  Yes     Jennavecia Schwier 02/21/2021, 2:21 PM
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
## 2021-02-21 NOTE — Progress Notes (Signed)
  Mobility Specialist Criteria Algorithm Info.  Mobility Team: [facility] elevated:HOB 30 Activity: Ambulated in room; Ambulated to bathroom Range of motion: Active; All extremities Level of assistance: Minimal assist, patient does 75% or more Assistive device: Front wheel walker Minutes sitting in chair:  Minutes stood:  Minutes ambulated:  Distance ambulated (ft): 30 ft Mobility response: Tolerated well Bed Position: Chair  Patient received lying supine in bed willing and eager to participate in mobility. Min A to assist LLE to EOB and supervision supine <> sit. Requested assistance to restroom upon dangling EOB. Pt stood with min A from elevated surface + cues for hand placement and knee precautions. Ambulated to bathroom with slow steady gait at min guard. Required mod A to descend from standing to sitting on toilet and mod A sit<>stand from toilet. Pt sat in recliner chair after using restroom. Tolerated ambulation well without complaint or incident and was left with all needs met and spouse present.   02/21/2021 11:25 AM
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
## 2021-02-22 ENCOUNTER — Other Ambulatory Visit: Payer: Self-pay

## 2021-02-22 DIAGNOSIS — S81002A Unspecified open wound, left knee, initial encounter: Secondary | ICD-10-CM | POA: Diagnosis not present

## 2021-02-22 MED ORDER — OXYCODONE-ACETAMINOPHEN 5-325 MG PO TABS: 1.0000 | ORAL_TABLET | Freq: Four times a day (QID) | ORAL | 0 refills | Status: DC | PRN

## 2021-02-22 MED ORDER — BISACODYL 10 MG RE SUPP: 10.0000 mg | Freq: Every day | RECTAL | 0 refills | Status: DC | PRN

## 2021-02-22 MED ORDER — OXYCODONE-ACETAMINOPHEN 5-325 MG PO TABS: 1.0000 | ORAL_TABLET | Freq: Three times a day (TID) | ORAL | 0 refills | Status: DC | PRN

## 2021-02-22 MED ORDER — POLYETHYLENE GLYCOL 3350 17 G PO PACK: 17.0000 g | PACK | Freq: Every day | ORAL | 0 refills | Status: DC

## 2021-02-22 MED ORDER — ACETAMINOPHEN 500 MG PO TABS: 500.0000 mg | ORAL_TABLET | Freq: Three times a day (TID) | ORAL | 0 refills | Status: AC | PRN

## 2021-02-22 MED ORDER — ACETAMINOPHEN 500 MG PO TABS: 500.0000 mg | ORAL_TABLET | Freq: Three times a day (TID) | ORAL | 0 refills | Status: DC | PRN

## 2021-02-22 MED ORDER — DOCUSATE SODIUM 100 MG PO CAPS: 100.0000 mg | ORAL_CAPSULE | Freq: Two times a day (BID) | ORAL | 0 refills | Status: DC

## 2021-02-22 MED ORDER — ESZOPICLONE 1 MG PO TABS: 1.0000 mg | ORAL_TABLET | Freq: Every day | ORAL | 0 refills | Status: DC

## 2021-02-22 MED ORDER — DOCUSATE SODIUM 100 MG PO CAPS: 100.0000 mg | ORAL_CAPSULE | Freq: Two times a day (BID) | ORAL | 0 refills | Status: AC

## 2021-02-22 NOTE — Plan of Care (Signed)

## 2021-02-22 NOTE — Discharge Instructions (Addendum)
Follow with Primary MD Josetta Huddle, MD and Dr Sharol Given in 7 days   Get CBC, CMP, TSH, Magnesium -  checked next visit within 1 week by Primary MD or SNF MD   Activity: Left leg weightbearing as tolerated with Full fall precautions use walker/cane & assistance as needed, continue left knee immobilizer until you are seen by Dr. Sharol Given and asked to remove it, first dressing change with Dr. Sharol Given in his office.  Disposition Home  Diet: Heart Healthy    Special Instructions: If you have smoked or chewed Tobacco  in the last 2 yrs please stop smoking, stop any regular Alcohol  and or any Recreational drug use.  On your next visit with your primary care physician please Get Medicines reviewed and adjusted.  Please request your Prim.MD to go over all Hospital Tests and Procedure/Radiological results at the follow up, please get all Hospital records sent to your Prim MD by signing hospital release before you go home.  If you experience worsening of your admission symptoms, develop shortness of breath, life threatening emergency, suicidal or homicidal thoughts you must seek medical attention immediately by calling 911 or calling your MD immediately  if symptoms less severe.  You Must read complete instructions/literature along with all the possible adverse reactions/side effects for all the Medicines you take and that have been prescribed to you. Take any new Medicines after you have completely understood and accpet all the possible adverse reactions/side effects.

## 2021-02-22 NOTE — Progress Notes (Signed)
Inpatient Rehab Admissions Coordinator:   PT saw Pt. And recommended HH as Pt. Is progressing rapidly. Family in in agreement. I will sign off and notify TOC.  Clemens Catholic, [place], [place] Admissions Coordinator  [phone] ([place]) [phone] (office)

## 2021-02-22 NOTE — TOC Transition Note (Addendum)
Transition of Care [facility]) - CM/SW Discharge Note   Patient Details  Name: Tom Norton MRN: [id number] Date of Birth: 08-Sep-1929  [facility] CM/SW Contact:  Carles Collet, RN Phone Number: 02/22/2021, 10:19 AM   Clinical Narrative:    Spoke w patient, he deferred to wife. Discussed plan for DC with wife. Patient will DC to ome. She is interested in [facility] Main services, reviewed [facility] : [facility] ratings over the phone. Referral for [facility] RN PT OT as ordered made to [facility]. Wife confirms that they have RW and 3/1 at home. She declined any additional DME needs. Per Dr Jess Barters note patient will DC with [facility], briefly discussed w wife that they will not need dressing changes for this and will follow up with MD for [facility] care.      Final next level of care: [place] Barriers to Discharge: No Barriers Identified   Patient Goals and CMS Choice Patient states their goals for this hospitalization and ongoing recovery are:: be active again CMS Medicare.gov Compare Post Acute Care list provided to:: Patient Represenative (must comment) Choice offered to / list presented to : Spouse  Discharge Placement                       Discharge Plan and Services In-house Referral: Clinical Social Work   Post Acute Care Choice: IP Rehab                    HH Arranged: PT, OT, RN [place] Agency: [place] Date [place]: 02/22/21 Time [place]: 15 Representative spoke with at [place]: [place] ([place] Interventions     Readmission Risk Interventions No flowsheet data found.
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
## 2021-02-22 NOTE — Plan of Care (Addendum)
@  1150 Patient stable. Patient understands discharge orders. No questions. Changed wound vac to preevena. Educated patient and patient wife on preevena and keeping it charged and how to make sure it is on and not leaking. Patient and wife understood instructions.    Problem: Education: Goal: Knowledge of General Education information will improve Description: Including pain rating scale, medication(s)/side effects and non-pharmacologic comfort measures Outcome: Progressing   Problem: Activity: Goal: Risk for activity intolerance will decrease Outcome: Progressing   Problem: Pain Managment: Goal: General experience of comfort will improve Outcome: Progressing   Problem: Safety: Goal: Ability to remain free from injury will improve Outcome: Progressing   Problem: Skin Integrity: Goal: Risk for impaired skin integrity will decrease Outcome: Progressing
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
## 2021-02-24 DIAGNOSIS — K219 Gastro-esophageal reflux disease without esophagitis: Secondary | ICD-10-CM | POA: Diagnosis not present

## 2021-02-24 DIAGNOSIS — Z7901 Long term (current) use of anticoagulants: Secondary | ICD-10-CM | POA: Diagnosis not present

## 2021-02-24 DIAGNOSIS — S81001D Unspecified open wound, right knee, subsequent encounter: Secondary | ICD-10-CM | POA: Diagnosis not present

## 2021-02-24 DIAGNOSIS — E039 Hypothyroidism, unspecified: Secondary | ICD-10-CM | POA: Diagnosis not present

## 2021-02-24 DIAGNOSIS — N400 Enlarged prostate without lower urinary tract symptoms: Secondary | ICD-10-CM | POA: Diagnosis not present

## 2021-02-24 DIAGNOSIS — G629 Polyneuropathy, unspecified: Secondary | ICD-10-CM | POA: Diagnosis not present

## 2021-02-24 DIAGNOSIS — M47816 Spondylosis without myelopathy or radiculopathy, lumbar region: Secondary | ICD-10-CM | POA: Diagnosis not present

## 2021-02-24 DIAGNOSIS — I119 Hypertensive heart disease without heart failure: Secondary | ICD-10-CM | POA: Diagnosis not present

## 2021-02-24 DIAGNOSIS — E785 Hyperlipidemia, unspecified: Secondary | ICD-10-CM | POA: Diagnosis not present

## 2021-02-24 DIAGNOSIS — I480 Paroxysmal atrial fibrillation: Secondary | ICD-10-CM | POA: Diagnosis not present

## 2021-02-24 DIAGNOSIS — E559 Vitamin D deficiency, unspecified: Secondary | ICD-10-CM | POA: Diagnosis not present

## 2021-02-24 DIAGNOSIS — G4700 Insomnia, unspecified: Secondary | ICD-10-CM | POA: Diagnosis not present

## 2021-02-24 DIAGNOSIS — Z96652 Presence of left artificial knee joint: Secondary | ICD-10-CM | POA: Diagnosis not present

## 2021-02-24 DIAGNOSIS — E669 Obesity, unspecified: Secondary | ICD-10-CM | POA: Diagnosis not present

## 2021-02-24 DIAGNOSIS — M47814 Spondylosis without myelopathy or radiculopathy, thoracic region: Secondary | ICD-10-CM | POA: Diagnosis not present

## 2021-02-24 DIAGNOSIS — Z9181 History of falling: Secondary | ICD-10-CM | POA: Diagnosis not present

## 2021-02-25 DIAGNOSIS — E039 Hypothyroidism, unspecified: Secondary | ICD-10-CM | POA: Diagnosis not present

## 2021-02-25 DIAGNOSIS — S81001D Unspecified open wound, right knee, subsequent encounter: Secondary | ICD-10-CM | POA: Diagnosis not present

## 2021-02-25 DIAGNOSIS — G629 Polyneuropathy, unspecified: Secondary | ICD-10-CM | POA: Diagnosis not present

## 2021-02-25 DIAGNOSIS — I480 Paroxysmal atrial fibrillation: Secondary | ICD-10-CM | POA: Diagnosis not present

## 2021-02-25 DIAGNOSIS — I119 Hypertensive heart disease without heart failure: Secondary | ICD-10-CM | POA: Diagnosis not present

## 2021-02-25 DIAGNOSIS — E785 Hyperlipidemia, unspecified: Secondary | ICD-10-CM | POA: Diagnosis not present

## 2021-02-28 DIAGNOSIS — E039 Hypothyroidism, unspecified: Secondary | ICD-10-CM | POA: Diagnosis not present

## 2021-02-28 DIAGNOSIS — S81001D Unspecified open wound, right knee, subsequent encounter: Secondary | ICD-10-CM | POA: Diagnosis not present

## 2021-02-28 DIAGNOSIS — E785 Hyperlipidemia, unspecified: Secondary | ICD-10-CM | POA: Diagnosis not present

## 2021-02-28 DIAGNOSIS — I119 Hypertensive heart disease without heart failure: Secondary | ICD-10-CM | POA: Diagnosis not present

## 2021-02-28 DIAGNOSIS — I480 Paroxysmal atrial fibrillation: Secondary | ICD-10-CM | POA: Diagnosis not present

## 2021-02-28 DIAGNOSIS — G629 Polyneuropathy, unspecified: Secondary | ICD-10-CM | POA: Diagnosis not present

## 2021-03-02 ENCOUNTER — Ambulatory Visit (INDEPENDENT_AMBULATORY_CARE_PROVIDER_SITE_OTHER): Payer: Medicare Other | Admitting: Family

## 2021-03-02 ENCOUNTER — Encounter: Payer: Self-pay | Admitting: Family

## 2021-03-02 DIAGNOSIS — S81002D Unspecified open wound, left knee, subsequent encounter: Secondary | ICD-10-CM

## 2021-03-02 NOTE — Progress Notes (Signed)
Post-Op Visit Note   Patient: Tom Norton           Date of Birth: 03-09-1930           MRN: [id number] Visit Date: 03/02/2021 PCP: Josetta Huddle, MD  Chief Complaint:  Chief Complaint  Patient presents with   Left Knee - Routine Post Op    I&D 02/18/21    HPI:  HPI The patient is an 87 year old gentleman seen status post irrigation and debridement of left knee on October 28.  Wound VAC removed today.  He has been given immobilizer. Ortho Exam Incision is well approximated with sutures and staples there is very mild surrounding edema.  No erythema no drainage no open area  Visit Diagnoses: No diagnosis found.  Plan: May discontinue the knee immobilizer.  We will hold off on sutures and staples until next week.  He may continue with physical therapy.  No aggressive flexion weightbearing as tolerated Follow-Up Instructions: Return in about 8 days (around 03/10/2021).   Imaging: No results found.  Orders:  No orders of the defined types were placed in this encounter.  No orders of the defined types were placed in this encounter.    PMFS History: Patient Active Problem List   Diagnosis Date Noted   Fall    Open knee wound, left, initial encounter 02/17/2021   Rib fracture 02/17/2021   Intestinal obstruction without gangrene due to recurrent left inguinal hernia 05/02/2019   Dehydration 05/02/2019   Essential hypertension 05/02/2019   Dyslipidemia 05/02/2019   Hyperglycemia 05/02/2019   Primary osteoarthritis of left knee 07/20/2017   S/P knee replacement 07/20/2017   Peripheral neuropathy 07/17/2017   Paroxysmal atrial fibrillation ([facility]) 10/31/2015   Hypothyroidism 10/31/2015   Dilated aortic root ([facility]) 10/08/2014   Tick bite of right thigh 08/18/2014   Rash 12/10/2013   Hypotension 09/23/2013   Renal insufficiency 09/23/2013   Past Medical History:  Diagnosis Date   Cardiomegaly    Dilated aortic root ([facility])    a. 09/2013: 4.2cm by echo.   Enlarged  prostate without lower urinary tract symptoms (luts)    GERD (gastroesophageal reflux disease)    rare   History of hiatal hernia    Hyperlipidemia    Hypertension    Hypothyroidism    Insomnia    Junctional bradycardia    OA (osteoarthritis)    Obesity    Paroxysmal A-fib ([facility])    a. 09/23/13 a-fib with RVR and hypotensive, requiring emergent cardioversion in ED   Peripheral neuropathy 07/17/2017   Vitamin D deficiency     Family History  Problem Relation Age of Onset   Heart disease Father    Stroke Brother    Heart disease Brother     Past Surgical History:  Procedure Laterality Date   CARDIOVERSION  2015   HERNIA REPAIR     I & D EXTREMITY Left 02/18/2021   Procedure: IRRIGATION AND DEBRIDEMENT KNEE;  Surgeon: Newt Minion, MD;  Location: [facility];  Service: Orthopedics;  Laterality: Left;   INGUINAL HERNIA REPAIR Left 05/03/2019   Procedure: HERNIA REPAIR INGUINAL ADULT WITH MESH;  Surgeon: Armandina Gemma, MD;  Location: [facility];  Service: General;  Laterality: Left;   TOTAL KNEE ARTHROPLASTY Left 07/20/2017   Procedure: LEFT TOTAL KNEE ARTHROPLASTY;  Surgeon: Sydnee Cabal, MD;  Location: [facility];  Service: Orthopedics;  Laterality: Left;  Adductor Block   TRANSTHORACIC ECHOCARDIOGRAM  09/23/2013   EF 55-60%; normal wall motion. Moderately dilated aortic  root and ascending aorta; mild AI; moderate bi-atrial enlargement   Social History   Occupational History   Occupation: retired  Tobacco Use   Smoking status: Never   Smokeless tobacco: Never  Vaping Use   Vaping Use: Never used  Substance and Sexual Activity   Alcohol use: Yes    Alcohol/week: 10.0 standard drinks    Types: 10 Glasses of wine per week    Comment: couple of glasses of wine a day most days   Drug use: No   Sexual activity: Not on file

## 2021-03-03 ENCOUNTER — Ambulatory Visit
Admission: RE | Admit: 2021-03-03 | Discharge: 2021-03-03 | Disposition: A | Discharge status: Home / Self Care | Payer: Medicare Other | Source: Ambulatory Visit | Attending: Internal Medicine | Admitting: Internal Medicine

## 2021-03-03 ENCOUNTER — Other Ambulatory Visit: Payer: Self-pay | Admitting: Internal Medicine

## 2021-03-03 DIAGNOSIS — R079 Chest pain, unspecified: Secondary | ICD-10-CM | POA: Diagnosis not present

## 2021-03-03 DIAGNOSIS — E039 Hypothyroidism, unspecified: Secondary | ICD-10-CM | POA: Diagnosis not present

## 2021-03-03 DIAGNOSIS — S80219A Abrasion, unspecified knee, initial encounter: Secondary | ICD-10-CM | POA: Diagnosis not present

## 2021-03-03 DIAGNOSIS — S2239XA Fracture of one rib, unspecified side, initial encounter for closed fracture: Secondary | ICD-10-CM | POA: Diagnosis not present

## 2021-03-04 DIAGNOSIS — E785 Hyperlipidemia, unspecified: Secondary | ICD-10-CM | POA: Diagnosis not present

## 2021-03-04 DIAGNOSIS — I480 Paroxysmal atrial fibrillation: Secondary | ICD-10-CM | POA: Diagnosis not present

## 2021-03-04 DIAGNOSIS — S81001D Unspecified open wound, right knee, subsequent encounter: Secondary | ICD-10-CM | POA: Diagnosis not present

## 2021-03-04 DIAGNOSIS — I119 Hypertensive heart disease without heart failure: Secondary | ICD-10-CM | POA: Diagnosis not present

## 2021-03-04 DIAGNOSIS — E039 Hypothyroidism, unspecified: Secondary | ICD-10-CM | POA: Diagnosis not present

## 2021-03-04 DIAGNOSIS — G629 Polyneuropathy, unspecified: Secondary | ICD-10-CM | POA: Diagnosis not present

## 2021-03-08 DIAGNOSIS — S81001D Unspecified open wound, right knee, subsequent encounter: Secondary | ICD-10-CM | POA: Diagnosis not present

## 2021-03-08 DIAGNOSIS — I10 Essential (primary) hypertension: Secondary | ICD-10-CM | POA: Diagnosis not present

## 2021-03-08 DIAGNOSIS — M179 Osteoarthritis of knee, unspecified: Secondary | ICD-10-CM | POA: Diagnosis not present

## 2021-03-08 DIAGNOSIS — N400 Enlarged prostate without lower urinary tract symptoms: Secondary | ICD-10-CM | POA: Diagnosis not present

## 2021-03-08 DIAGNOSIS — I119 Hypertensive heart disease without heart failure: Secondary | ICD-10-CM | POA: Diagnosis not present

## 2021-03-08 DIAGNOSIS — G629 Polyneuropathy, unspecified: Secondary | ICD-10-CM | POA: Diagnosis not present

## 2021-03-08 DIAGNOSIS — E039 Hypothyroidism, unspecified: Secondary | ICD-10-CM | POA: Diagnosis not present

## 2021-03-08 DIAGNOSIS — G4700 Insomnia, unspecified: Secondary | ICD-10-CM | POA: Diagnosis not present

## 2021-03-08 DIAGNOSIS — M170 Bilateral primary osteoarthritis of knee: Secondary | ICD-10-CM | POA: Diagnosis not present

## 2021-03-08 DIAGNOSIS — E785 Hyperlipidemia, unspecified: Secondary | ICD-10-CM | POA: Diagnosis not present

## 2021-03-08 DIAGNOSIS — F068 Other specified mental disorders due to known physiological condition: Secondary | ICD-10-CM | POA: Diagnosis not present

## 2021-03-08 DIAGNOSIS — I480 Paroxysmal atrial fibrillation: Secondary | ICD-10-CM | POA: Diagnosis not present

## 2021-03-08 DIAGNOSIS — E782 Mixed hyperlipidemia: Secondary | ICD-10-CM | POA: Diagnosis not present

## 2021-03-09 DIAGNOSIS — I119 Hypertensive heart disease without heart failure: Secondary | ICD-10-CM | POA: Diagnosis not present

## 2021-03-09 DIAGNOSIS — E785 Hyperlipidemia, unspecified: Secondary | ICD-10-CM | POA: Diagnosis not present

## 2021-03-09 DIAGNOSIS — I480 Paroxysmal atrial fibrillation: Secondary | ICD-10-CM | POA: Diagnosis not present

## 2021-03-09 DIAGNOSIS — G629 Polyneuropathy, unspecified: Secondary | ICD-10-CM | POA: Diagnosis not present

## 2021-03-09 DIAGNOSIS — E039 Hypothyroidism, unspecified: Secondary | ICD-10-CM | POA: Diagnosis not present

## 2021-03-09 DIAGNOSIS — S81001D Unspecified open wound, right knee, subsequent encounter: Secondary | ICD-10-CM | POA: Diagnosis not present

## 2021-03-10 ENCOUNTER — Ambulatory Visit (INDEPENDENT_AMBULATORY_CARE_PROVIDER_SITE_OTHER): Payer: Medicare Other | Admitting: Orthopedic Surgery

## 2021-03-10 DIAGNOSIS — S81002D Unspecified open wound, left knee, subsequent encounter: Secondary | ICD-10-CM

## 2021-03-11 ENCOUNTER — Telehealth: Payer: Self-pay | Admitting: Orthopedic Surgery

## 2021-03-11 NOTE — Telephone Encounter (Signed)
Pt calling concerning his comp sock not staying up post surg. Surg date was 02/18/21 with Sharol Given. The best call back number is [phone].

## 2021-03-11 NOTE — Telephone Encounter (Signed)
I spoke with pt he says that he has his compression sock on the right way, no wrinkles. The top of sock keeps rolling down past his knee and incision. He doesn't have any swelling in knee or leg. Per Junie Panning, okay to wrap in ACE bandage for now. He will f/u with [place] in 3 weeks. Told him to bring compression sock with him and we will try to figure out a solution for him. He is okay with this plan.

## 2021-03-14 DIAGNOSIS — S81001D Unspecified open wound, right knee, subsequent encounter: Secondary | ICD-10-CM | POA: Diagnosis not present

## 2021-03-14 DIAGNOSIS — I119 Hypertensive heart disease without heart failure: Secondary | ICD-10-CM | POA: Diagnosis not present

## 2021-03-14 DIAGNOSIS — E039 Hypothyroidism, unspecified: Secondary | ICD-10-CM | POA: Diagnosis not present

## 2021-03-14 DIAGNOSIS — I480 Paroxysmal atrial fibrillation: Secondary | ICD-10-CM | POA: Diagnosis not present

## 2021-03-14 DIAGNOSIS — E785 Hyperlipidemia, unspecified: Secondary | ICD-10-CM | POA: Diagnosis not present

## 2021-03-14 DIAGNOSIS — G629 Polyneuropathy, unspecified: Secondary | ICD-10-CM | POA: Diagnosis not present

## 2021-03-15 DIAGNOSIS — E785 Hyperlipidemia, unspecified: Secondary | ICD-10-CM | POA: Diagnosis not present

## 2021-03-15 DIAGNOSIS — E039 Hypothyroidism, unspecified: Secondary | ICD-10-CM | POA: Diagnosis not present

## 2021-03-15 DIAGNOSIS — G629 Polyneuropathy, unspecified: Secondary | ICD-10-CM | POA: Diagnosis not present

## 2021-03-15 DIAGNOSIS — I119 Hypertensive heart disease without heart failure: Secondary | ICD-10-CM | POA: Diagnosis not present

## 2021-03-15 DIAGNOSIS — I480 Paroxysmal atrial fibrillation: Secondary | ICD-10-CM | POA: Diagnosis not present

## 2021-03-15 DIAGNOSIS — S81001D Unspecified open wound, right knee, subsequent encounter: Secondary | ICD-10-CM | POA: Diagnosis not present

## 2021-03-22 DIAGNOSIS — E785 Hyperlipidemia, unspecified: Secondary | ICD-10-CM | POA: Diagnosis not present

## 2021-03-22 DIAGNOSIS — I119 Hypertensive heart disease without heart failure: Secondary | ICD-10-CM | POA: Diagnosis not present

## 2021-03-22 DIAGNOSIS — I480 Paroxysmal atrial fibrillation: Secondary | ICD-10-CM | POA: Diagnosis not present

## 2021-03-22 DIAGNOSIS — E039 Hypothyroidism, unspecified: Secondary | ICD-10-CM | POA: Diagnosis not present

## 2021-03-22 DIAGNOSIS — G629 Polyneuropathy, unspecified: Secondary | ICD-10-CM | POA: Diagnosis not present

## 2021-03-22 DIAGNOSIS — S81001D Unspecified open wound, right knee, subsequent encounter: Secondary | ICD-10-CM | POA: Diagnosis not present

## 2021-03-25 ENCOUNTER — Other Ambulatory Visit: Payer: Self-pay

## 2021-03-25 ENCOUNTER — Encounter: Payer: Self-pay | Admitting: Cardiovascular Disease

## 2021-03-25 ENCOUNTER — Ambulatory Visit (INDEPENDENT_AMBULATORY_CARE_PROVIDER_SITE_OTHER): Payer: Medicare Other | Admitting: Cardiovascular Disease

## 2021-03-25 VITALS — BP 130/77 | HR 83 | Ht 72.0 in | Wt 205.2 lb

## 2021-03-25 DIAGNOSIS — I480 Paroxysmal atrial fibrillation: Secondary | ICD-10-CM | POA: Diagnosis not present

## 2021-03-25 DIAGNOSIS — Z7901 Long term (current) use of anticoagulants: Secondary | ICD-10-CM | POA: Diagnosis not present
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
## 2021-03-25 NOTE — Patient Instructions (Signed)

## 2021-03-26 DIAGNOSIS — G629 Polyneuropathy, unspecified: Secondary | ICD-10-CM | POA: Diagnosis not present

## 2021-03-26 DIAGNOSIS — Z7901 Long term (current) use of anticoagulants: Secondary | ICD-10-CM | POA: Diagnosis not present

## 2021-03-26 DIAGNOSIS — E559 Vitamin D deficiency, unspecified: Secondary | ICD-10-CM | POA: Diagnosis not present

## 2021-03-26 DIAGNOSIS — Z96652 Presence of left artificial knee joint: Secondary | ICD-10-CM | POA: Diagnosis not present

## 2021-03-26 DIAGNOSIS — K219 Gastro-esophageal reflux disease without esophagitis: Secondary | ICD-10-CM | POA: Diagnosis not present

## 2021-03-26 DIAGNOSIS — Z9181 History of falling: Secondary | ICD-10-CM | POA: Diagnosis not present

## 2021-03-26 DIAGNOSIS — S81001D Unspecified open wound, right knee, subsequent encounter: Secondary | ICD-10-CM | POA: Diagnosis not present

## 2021-03-26 DIAGNOSIS — I480 Paroxysmal atrial fibrillation: Secondary | ICD-10-CM | POA: Diagnosis not present

## 2021-03-26 DIAGNOSIS — M47814 Spondylosis without myelopathy or radiculopathy, thoracic region: Secondary | ICD-10-CM | POA: Diagnosis not present

## 2021-03-26 DIAGNOSIS — N400 Enlarged prostate without lower urinary tract symptoms: Secondary | ICD-10-CM | POA: Diagnosis not present

## 2021-03-26 DIAGNOSIS — G4700 Insomnia, unspecified: Secondary | ICD-10-CM | POA: Diagnosis not present

## 2021-03-26 DIAGNOSIS — E785 Hyperlipidemia, unspecified: Secondary | ICD-10-CM | POA: Diagnosis not present

## 2021-03-26 DIAGNOSIS — E669 Obesity, unspecified: Secondary | ICD-10-CM | POA: Diagnosis not present

## 2021-03-26 DIAGNOSIS — E039 Hypothyroidism, unspecified: Secondary | ICD-10-CM | POA: Diagnosis not present

## 2021-03-26 DIAGNOSIS — M47816 Spondylosis without myelopathy or radiculopathy, lumbar region: Secondary | ICD-10-CM | POA: Diagnosis not present

## 2021-03-26 DIAGNOSIS — I119 Hypertensive heart disease without heart failure: Secondary | ICD-10-CM | POA: Diagnosis not present

## 2021-03-28 ENCOUNTER — Encounter: Payer: Self-pay | Admitting: Orthopedic Surgery

## 2021-03-28 NOTE — Progress Notes (Signed)
Office Visit Note   Patient: Tom Norton           Date of Birth: 06/05/1929           MRN: [id number] Visit Date: 03/10/2021              Requested by: Josetta Huddle, MD [address] [place],  [place] [address] PCP: Josetta Huddle, MD  Chief Complaint  Patient presents with   Left Knee - Routine Post Op    02/18/21 left knee I&D      HPI: Patient is a 87 year old gentleman who presents 3 weeks status post debridement left knee wound.  Patient states he feels well has no drainage or concerns.  Assessment & Plan: Visit Diagnoses:  1. Open wound of left knee, subsequent encounter     Plan: Sutures and staples removed recommend a double extra-large compression stocking to be worn around-the-clock.  Follow-Up Instructions: Return in about 3 weeks (around 03/31/2021).   Ortho Exam  Patient is alert, oriented, no adenopathy, well-dressed, normal affect, normal respiratory effort. Examination the incision is healing well there is no cellulitis no odor no drainage.  Sutures and staples harvested.  Imaging: No results found. No images are attached to the encounter.  Labs: Lab Results  Component Value Date   HGBA1C 5.5 09/23/2013   ESRSEDRATE 2 07/17/2017     Lab Results  Component Value Date   ALBUMIN 2.7 (L) 02/20/2021   ALBUMIN 3.1 (L) 05/04/2019   ALBUMIN 3.7 05/03/2019    No results found for: MG No results found for: VD25OH  No results found for: PREALBUMIN CBC EXTENDED Latest Ref Rng & Units 02/20/2021 02/18/2021 02/17/2021  WBC 4.0 - 10.5 K/uL 7.5 7.4 6.7  RBC 4.22 - 5.81 MIL/uL 4.34 4.69 4.65  HGB 13.0 - 17.0 g/dL 14.2 14.9 15.1  HCT 39.0 - 52.0 % 41.3 44.1 44.4  PLT 150 - 400 K/uL 149(L) 172 174  NEUTROABS 1.7 - 7.7 K/uL 4.4 - -  LYMPHSABS 0.7 - 4.0 K/uL 1.4 - -     There is no height or weight on file to calculate BMI.  Orders:  No orders of the defined types were placed in this encounter.  No orders of the defined types  were placed in this encounter.    Procedures: No procedures performed  Clinical Data: No additional findings.  ROS:  All other systems negative, except as noted in the HPI. Review of Systems  Objective: Vital Signs: There were no vitals taken for this visit.  Specialty Comments:  No specialty comments available.  PMFS History: Patient Active Problem List   Diagnosis Date Noted   Fall    Open knee wound, left, initial encounter 02/17/2021   Rib fracture 02/17/2021   Intestinal obstruction without gangrene due to recurrent left inguinal hernia 05/02/2019   Dehydration 05/02/2019   Essential hypertension 05/02/2019   Dyslipidemia 05/02/2019   Hyperglycemia 05/02/2019   Primary osteoarthritis of left knee 07/20/2017   S/P knee replacement 07/20/2017   Peripheral neuropathy 07/17/2017   Paroxysmal atrial fibrillation ([place]) 10/31/2015   Hypothyroidism 10/31/2015   Dilated aortic root ([place]) 10/08/2014   Tick bite of right thigh 08/18/2014   Rash 12/10/2013   Hypotension 09/23/2013   Renal insufficiency 09/23/2013   Past Medical History:  Diagnosis Date   Cardiomegaly    Dilated aortic root ([place])    a. 09/2013: 4.2cm by echo.   Enlarged prostate without lower urinary tract symptoms (luts)  GERD (gastroesophageal reflux disease)    rare   History of hiatal hernia    Hyperlipidemia    Hypertension    Hypothyroidism    Insomnia    Junctional bradycardia    OA (osteoarthritis)    Obesity    Paroxysmal A-fib ([place])    a. 09/23/13 a-fib with RVR and hypotensive, requiring emergent cardioversion in ED   Peripheral neuropathy 07/17/2017   Vitamin D deficiency     Family History  Problem Relation Age of Onset   Heart disease Father    Stroke Brother    Heart disease Brother     Past Surgical History:  Procedure Laterality Date   CARDIOVERSION  2015   HERNIA REPAIR     I & D EXTREMITY Left 02/18/2021   Procedure: IRRIGATION AND DEBRIDEMENT KNEE;  Surgeon: Newt Minion, MD;  Location: [facility];  Service: Orthopedics;  Laterality: Left;   INGUINAL HERNIA REPAIR Left 05/03/2019   Procedure: HERNIA REPAIR INGUINAL ADULT WITH MESH;  Surgeon: Armandina Gemma, MD;  [facility];  Service: General;  Laterality: Left;   TOTAL KNEE ARTHROPLASTY Left 07/20/2017   Procedure: LEFT TOTAL KNEE ARTHROPLASTY;  Surgeon: Sydnee Cabal, MD;  [facility];  Service: Orthopedics;  Laterality: Left;  Adductor Block   TRANSTHORACIC ECHOCARDIOGRAM  09/23/2013   EF 55-60%; normal wall motion. Moderately dilated aortic root and ascending aorta; mild AI; moderate bi-atrial enlargement   Social History   Occupational History   Occupation: retired  Tobacco Use   Smoking status: Never   Smokeless tobacco: Never  Vaping Use   Vaping Use: Never used  Substance and Sexual Activity   Alcohol use: Yes    Alcohol/week: 10.0 standard drinks    Types: 10 Glasses of wine per week    Comment: couple of glasses of wine a day most days   Drug use: No   Sexual activity: Not on file

## 2021-03-31 ENCOUNTER — Ambulatory Visit (INDEPENDENT_AMBULATORY_CARE_PROVIDER_SITE_OTHER): Payer: Medicare Other | Admitting: Orthopedic Surgery

## 2021-03-31 DIAGNOSIS — S81002D Unspecified open wound, left knee, subsequent encounter: Secondary | ICD-10-CM

## 2021-04-01 DIAGNOSIS — G629 Polyneuropathy, unspecified: Secondary | ICD-10-CM | POA: Diagnosis not present

## 2021-04-01 DIAGNOSIS — E039 Hypothyroidism, unspecified: Secondary | ICD-10-CM | POA: Diagnosis not present

## 2021-04-01 DIAGNOSIS — E785 Hyperlipidemia, unspecified: Secondary | ICD-10-CM | POA: Diagnosis not present

## 2021-04-01 DIAGNOSIS — I480 Paroxysmal atrial fibrillation: Secondary | ICD-10-CM | POA: Diagnosis not present

## 2021-04-01 DIAGNOSIS — I119 Hypertensive heart disease without heart failure: Secondary | ICD-10-CM | POA: Diagnosis not present

## 2021-04-01 DIAGNOSIS — S81001D Unspecified open wound, right knee, subsequent encounter: Secondary | ICD-10-CM | POA: Diagnosis not present

## 2021-04-05 ENCOUNTER — Encounter: Payer: Self-pay | Admitting: Orthopedic Surgery

## 2021-04-05 NOTE — Progress Notes (Signed)
Office Visit Note   Patient: Tom Norton           Date of Birth: 16-Jul-1929           MRN: [id number] Visit Date: 03/31/2021              Requested by: Josetta Huddle, MD [address] [place] [address] [place],  [place] [address] PCP: Josetta Huddle, MD  Chief Complaint  Patient presents with   Left Knee - Routine Post Op    Left knee I&D 02/18/21      HPI: Patient is an 87 year old gentleman who presents about 5 weeks status post debridement of a large ulcer over the patella.  There was no exposed total joint hardware.  Patient underwent local tissue rearrangement for wound closure.  Assessment & Plan: Visit Diagnoses:  1. Open wound of left knee, subsequent encounter     Plan: The wound is well-healed he has full extension of his knee.  He will continue to work on range of motion exercises.  Follow-Up Instructions: Return if symptoms worsen or fail to improve.   Ortho Exam  Patient is alert, oriented, no adenopathy, well-dressed, normal affect, normal respiratory effort. Patient is seen in follow-up there is no swelling there is good epithelization with good wound healing.  Patient has been using the knee-high compression stocking to facilitate healing.  There is no redness cellulitis or signs of infection.  Imaging: No results found. No images are attached to the encounter.  Labs: Lab Results  Component Value Date   HGBA1C 5.5 09/23/2013   ESRSEDRATE 2 07/17/2017     Lab Results  Component Value Date   ALBUMIN 2.7 (L) 02/20/2021   ALBUMIN 3.1 (L) 05/04/2019   ALBUMIN 3.7 05/03/2019    No results found for: MG No results found for: VD25OH  No results found for: PREALBUMIN CBC EXTENDED Latest Ref Rng & Units 02/20/2021 02/18/2021 02/17/2021  WBC 4.0 - 10.5 K/uL 7.5 7.4 6.7  RBC 4.22 - 5.81 MIL/uL 4.34 4.69 4.65  HGB 13.0 - 17.0 g/dL 14.2 14.9 15.1  HCT 39.0 - 52.0 % 41.3 44.1 44.4  PLT 150 - 400 K/uL 149(L) 172 174  NEUTROABS 1.7 - 7.7 K/uL 4.4 - -   LYMPHSABS 0.7 - 4.0 K/uL 1.4 - -     There is no height or weight on file to calculate BMI.  Orders:  No orders of the defined types were placed in this encounter.  No orders of the defined types were placed in this encounter.    Procedures: No procedures performed  Clinical Data: No additional findings.  ROS:  All other systems negative, except as noted in the HPI. Review of Systems  Objective: Vital Signs: There were no vitals taken for this visit.  Specialty Comments:  No specialty comments available.  PMFS History: Patient Active Problem List   Diagnosis Date Noted   Fall    Open knee wound, left, initial encounter 02/17/2021   Rib fracture 02/17/2021   Intestinal obstruction without gangrene due to recurrent left inguinal hernia 05/02/2019   Dehydration 05/02/2019   Essential hypertension 05/02/2019   Dyslipidemia 05/02/2019   Hyperglycemia 05/02/2019   Primary osteoarthritis of left knee 07/20/2017   S/P knee replacement 07/20/2017   Peripheral neuropathy 07/17/2017   Paroxysmal atrial fibrillation ([place]) 10/31/2015   Hypothyroidism 10/31/2015   Dilated aortic root ([place]) 10/08/2014   Tick bite of right thigh 08/18/2014   Rash 12/10/2013   Hypotension 09/23/2013  Renal insufficiency 09/23/2013   Past Medical History:  Diagnosis Date   Cardiomegaly    Dilated aortic root ([place])    a. 09/2013: 4.2cm by echo.   Enlarged prostate without lower urinary tract symptoms (luts)    GERD (gastroesophageal reflux disease)    rare   History of hiatal hernia    Hyperlipidemia    Hypertension    Hypothyroidism    Insomnia    Junctional bradycardia    OA (osteoarthritis)    Obesity    Paroxysmal A-fib ([place])    a. 09/23/13 a-fib with RVR and hypotensive, requiring emergent cardioversion in ED   Peripheral neuropathy 07/17/2017   Vitamin D deficiency     Family History  Problem Relation Age of Onset   Heart disease Father    Stroke Brother    Heart disease  Brother     Past Surgical History:  Procedure Laterality Date   CARDIOVERSION  2015   HERNIA REPAIR     I & D EXTREMITY Left 02/18/2021   Procedure: IRRIGATION AND DEBRIDEMENT KNEE;  Surgeon: Newt Minion, MD;  Location: [facility];  Service: Orthopedics;  Laterality: Left;   INGUINAL HERNIA REPAIR Left 05/03/2019   Procedure: HERNIA REPAIR INGUINAL ADULT WITH MESH;  Surgeon: Armandina Gemma, MD;  [facility];  Service: General;  Laterality: Left;   TOTAL KNEE ARTHROPLASTY Left 07/20/2017   Procedure: LEFT TOTAL KNEE ARTHROPLASTY;  Surgeon: Sydnee Cabal, MD;  [facility];  Service: Orthopedics;  Laterality: Left;  Adductor Block   TRANSTHORACIC ECHOCARDIOGRAM  09/23/2013   EF 55-60%; normal wall motion. Moderately dilated aortic root and ascending aorta; mild AI; moderate bi-atrial enlargement   Social History   Occupational History   Occupation: retired  Tobacco Use   Smoking status: Never   Smokeless tobacco: Never  Vaping Use   Vaping Use: Never used  Substance and Sexual Activity   Alcohol use: Yes    Alcohol/week: 10.0 standard drinks    Types: 10 Glasses of wine per week    Comment: couple of glasses of wine a day most days   Drug use: No   Sexual activity: Not on file

## 2021-04-08 DIAGNOSIS — E785 Hyperlipidemia, unspecified: Secondary | ICD-10-CM | POA: Diagnosis not present

## 2021-04-08 DIAGNOSIS — I119 Hypertensive heart disease without heart failure: Secondary | ICD-10-CM | POA: Diagnosis not present

## 2021-04-08 DIAGNOSIS — E039 Hypothyroidism, unspecified: Secondary | ICD-10-CM | POA: Diagnosis not present

## 2021-04-08 DIAGNOSIS — I480 Paroxysmal atrial fibrillation: Secondary | ICD-10-CM | POA: Diagnosis not present

## 2021-04-08 DIAGNOSIS — G629 Polyneuropathy, unspecified: Secondary | ICD-10-CM | POA: Diagnosis not present

## 2021-04-08 DIAGNOSIS — S81001D Unspecified open wound, right knee, subsequent encounter: Secondary | ICD-10-CM | POA: Diagnosis not present

## 2021-04-10 DIAGNOSIS — Z23 Encounter for immunization: Secondary | ICD-10-CM | POA: Diagnosis not present

## 2021-04-28 DIAGNOSIS — L02611 Cutaneous abscess of right foot: Secondary | ICD-10-CM | POA: Diagnosis not present

## 2021-04-28 DIAGNOSIS — K469 Unspecified abdominal hernia without obstruction or gangrene: Secondary | ICD-10-CM | POA: Diagnosis not present

## 2021-05-04 ENCOUNTER — Emergency Department (HOSPITAL_COMMUNITY): Payer: Medicare Other

## 2021-05-04 ENCOUNTER — Other Ambulatory Visit: Payer: Self-pay

## 2021-05-04 ENCOUNTER — Emergency Department (HOSPITAL_COMMUNITY)
Admission: EM | Admit: 2021-05-04 | Discharge: 2021-05-05 | Disposition: A | Discharge status: Home / Self Care | Payer: Medicare Other | Attending: Emergency Medicine | Admitting: Emergency Medicine

## 2021-05-04 DIAGNOSIS — E860 Dehydration: Secondary | ICD-10-CM | POA: Insufficient documentation

## 2021-05-04 DIAGNOSIS — I10 Essential (primary) hypertension: Secondary | ICD-10-CM | POA: Diagnosis not present

## 2021-05-04 DIAGNOSIS — Z7901 Long term (current) use of anticoagulants: Secondary | ICD-10-CM | POA: Insufficient documentation

## 2021-05-04 DIAGNOSIS — R0981 Nasal congestion: Secondary | ICD-10-CM | POA: Diagnosis not present

## 2021-05-04 DIAGNOSIS — R531 Weakness: Secondary | ICD-10-CM | POA: Diagnosis not present

## 2021-05-04 DIAGNOSIS — E039 Hypothyroidism, unspecified: Secondary | ICD-10-CM | POA: Insufficient documentation

## 2021-05-04 DIAGNOSIS — W19XXXA Unspecified fall, initial encounter: Secondary | ICD-10-CM | POA: Diagnosis not present

## 2021-05-04 DIAGNOSIS — R21 Rash and other nonspecific skin eruption: Secondary | ICD-10-CM | POA: Diagnosis not present

## 2021-05-04 DIAGNOSIS — G319 Degenerative disease of nervous system, unspecified: Secondary | ICD-10-CM | POA: Diagnosis not present

## 2021-05-04 DIAGNOSIS — S0990XA Unspecified injury of head, initial encounter: Secondary | ICD-10-CM | POA: Diagnosis not present

## 2021-05-04 DIAGNOSIS — R509 Fever, unspecified: Secondary | ICD-10-CM | POA: Diagnosis not present

## 2021-05-04 DIAGNOSIS — R000 Tachycardia, unspecified: Secondary | ICD-10-CM | POA: Diagnosis not present

## 2021-05-04 DIAGNOSIS — Z20822 Contact with and (suspected) exposure to covid-19: Secondary | ICD-10-CM | POA: Insufficient documentation

## 2021-05-04 DIAGNOSIS — R233 Spontaneous ecchymoses: Secondary | ICD-10-CM | POA: Diagnosis not present

## 2021-05-04 DIAGNOSIS — A419 Sepsis, unspecified organism: Secondary | ICD-10-CM | POA: Diagnosis not present

## 2021-05-04 DIAGNOSIS — R2689 Other abnormalities of gait and mobility: Secondary | ICD-10-CM | POA: Diagnosis not present

## 2021-05-04 DIAGNOSIS — I959 Hypotension, unspecified: Secondary | ICD-10-CM | POA: Diagnosis not present

## 2021-05-04 DIAGNOSIS — L089 Local infection of the skin and subcutaneous tissue, unspecified: Secondary | ICD-10-CM | POA: Diagnosis not present

## 2021-05-04 DIAGNOSIS — R0602 Shortness of breath: Secondary | ICD-10-CM | POA: Diagnosis not present

## 2021-05-04 LAB — CBC WITH DIFFERENTIAL/PLATELET
Abs Immature Granulocytes: 0.03 10*3/uL (ref 0.00–0.07)
Basophils Absolute: 0 10*3/uL (ref 0.0–0.1)
Basophils Relative: 0 %
Eosinophils Absolute: 0.7 10*3/uL — ABNORMAL HIGH (ref 0.0–0.5)
Eosinophils Relative: 16 %
HCT: 44.2 % (ref 39.0–52.0)
Hemoglobin: 14.9 g/dL (ref 13.0–17.0)
Immature Granulocytes: 1 %
Lymphocytes Relative: 9 %
Lymphs Abs: 0.4 10*3/uL — ABNORMAL LOW (ref 0.7–4.0)
MCH: 32 pg (ref 26.0–34.0)
MCHC: 33.7 g/dL (ref 30.0–36.0)
MCV: 95.1 fL (ref 80.0–100.0)
Monocytes Absolute: 0.4 10*3/uL (ref 0.1–1.0)
Monocytes Relative: 8 %
Neutro Abs: 3 10*3/uL (ref 1.7–7.7)
Neutrophils Relative %: 66 %
Platelets: 147 10*3/uL — ABNORMAL LOW (ref 150–400)
RBC: 4.65 MIL/uL (ref 4.22–5.81)
RDW: 13.3 % (ref 11.5–15.5)
WBC: 4.6 10*3/uL (ref 4.0–10.5)
nRBC: 0 % (ref 0.0–0.2)

## 2021-05-04 LAB — COMPREHENSIVE METABOLIC PANEL
ALT: 19 U/L (ref 0–44)
AST: 25 U/L (ref 15–41)
Albumin: 3.7 g/dL (ref 3.5–5.0)
Alkaline Phosphatase: 71 U/L (ref 38–126)
Anion gap: 9 (ref 5–15)
BUN: 17 mg/dL (ref 8–23)
CO2: 22 mmol/L (ref 22–32)
Calcium: 9.4 mg/dL (ref 8.9–10.3)
Chloride: 101 mmol/L (ref 98–111)
Creatinine, Ser: 1.63 mg/dL — ABNORMAL HIGH (ref 0.61–1.24)
GFR, Estimated: 40 mL/min — ABNORMAL LOW (ref >60)
Glucose, Bld: 118 mg/dL — ABNORMAL HIGH (ref 70–99)
Potassium: 4.5 mmol/L (ref 3.5–5.1)
Sodium: 132 mmol/L — ABNORMAL LOW (ref 135–145)
Total Bilirubin: 0.6 mg/dL (ref 0.3–1.2)
Total Protein: 6.1 g/dL — ABNORMAL LOW (ref 6.5–8.1)

## 2021-05-04 LAB — PROTIME-INR
INR: 1.4 — ABNORMAL HIGH (ref 0.8–1.2)
Prothrombin Time: 17.2 seconds — ABNORMAL HIGH (ref 11.4–15.2)

## 2021-05-04 LAB — LACTIC ACID, PLASMA: Lactic Acid, Venous: 1.2 mmol/L (ref 0.5–1.9)

## 2021-05-04 MED ORDER — SODIUM CHLORIDE 0.9 % IV BOLUS
500.0000 mL | Freq: Once | INTRAVENOUS | Status: AC
  Administered 2021-05-05: 500 mL via INTRAVENOUS

## 2021-05-04 NOTE — ED Triage Notes (Signed)
Pt arrives from [facility] where he went to be evaluated today for a few days of weakness, fatigue, toe infection, and fever last night tmax 102.9. Per AVS, toe infection seems to be resolved, but they do want [place] to look into a rash he has, possibly from the abx he finished yesterday am for his toe infection. Pt also had fall d/t weakness two days ago, falling and hitting his head without LOC on glass door. Pt is on Eliquis.

## 2021-05-04 NOTE — ED Notes (Signed)
Patient family member expresses frustration over wait time and not knowing the results of labs. States "there has to be a better way this is ridiculous to sit here this long and not know any answers or updates. This NT gave family number to patient experience so he could voice his concerns with them. Patients name moved to a room and family updated

## 2021-05-04 NOTE — ED Provider Triage Note (Addendum)
Emergency Medicine Provider Triage Evaluation Note  Tom Norton , Norton 88 y.o. male  was evaluated in triage.  Pt complains of fall onset yesterday.  He notes that he was attempting to disassemble his Christmas tree while on his knees.  He attempted to get up while holding onto the coffee table with his left arm and loss of balance and struck his right sided head on the glass. Pt was sent by his PCP for evaluation today due to several days of weakness, fatigue. Pt has associated fever (tmax 102.9 last night), generalized weakness, right third toe infection.  Patient was evaluated by his primary care provider [facility] today and they noted petechiae of the right lower extremity status post antibiotic use for right third toe infection. Denies fever, chills, wound, nausea, vomiting, dizziness, lightheadedness. Denies history of DM.  Patient takes Eliquis.  Review of Systems  Positive: As per HPI above Negative: Fever, chills  Physical Exam  BP 118/79 (BP Location: Left Arm)    Pulse (!) 109    Temp 98.6 F (37 C) (Oral)    Resp 16    SpO2 95%  Gen:   Awake, no distress   Resp:  Normal effort  MSK:   Moves extremities without difficulty  Other:  Mild tenderness to palpation to right frontal region.  Area of ecchymosis noted to right frontal region.  PERRL.  EOMI.  Medical Decision Making  Medically screening exam initiated at 3:10 PM.  Appropriate orders placed.  Tom Norton was informed that the remainder of the evaluation will be completed by another provider, this initial triage assessment does not replace that evaluation, and the importance of remaining in the ED until their evaluation is complete.  3:37 PM - Discussed with RN that patient is in need of Norton room immediately. RN aware and working on room placement.    Tom Billard A, PA-C 05/04/21 1538    Tom Newby A, PA-C 05/04/21 1538

## 2021-05-04 NOTE — ED Provider Notes (Signed)
Emergency Department Provider Note   I have reviewed the triage vital signs and the nursing notes.   HISTORY  Chief Complaint Weakness   HPI Tom Norton is a 88 y.o. male with PMH reviewed below presents to the ED with generalized weakness. Patient has had 3-4 days of weakness. He was treated with Bactrim for a right 3rd toe infection by the PCP. He completed bactrim yesterday but has developed an itchy rash over the entire body. No mucosal lesions. Son at bedside reports a 95 F temp yesterday with mild rhinorrhea but otherwise no additional fever. No abdominal pain, vomiting, or diarrhea. He did have a fall into a wall in the last 24 hours but no LOC. No unilateral numbness/weakness.    Past Medical History:  Diagnosis Date   Cardiomegaly    Dilated aortic root ([place])    a. 09/2013: 4.2cm by echo.   Enlarged prostate without lower urinary tract symptoms (luts)    GERD (gastroesophageal reflux disease)    rare   History of hiatal hernia    Hyperlipidemia    Hypertension    Hypothyroidism    Insomnia    Junctional bradycardia    OA (osteoarthritis)    Obesity    Paroxysmal A-fib ([place])    a. 09/23/13 a-fib with RVR and hypotensive, requiring emergent cardioversion in ED   Peripheral neuropathy 07/17/2017   Vitamin D deficiency     Review of Systems  Constitutional: No fever/chills. Positive generalized weakness.  Eyes: No visual changes. ENT: No sore throat. Cardiovascular: Denies chest pain. Respiratory: Denies shortness of breath. Gastrointestinal: No abdominal pain.  No nausea, no vomiting.  No diarrhea.  No constipation. Genitourinary: Negative for dysuria. Musculoskeletal: Negative for back pain. Skin: Positive for rash. Recent right toe infection (resolving).  Neurological: Negative for headaches, focal weakness or numbness.  10-point ROS otherwise negative.  ____________________________________________   PHYSICAL EXAM:  VITAL SIGNS: ED Triage  Vitals  Enc Vitals Group     BP 05/04/21 1502 118/79     Pulse Rate 05/04/21 1502 (!) 109     Resp 05/04/21 1502 16     Temp 05/04/21 1502 98.6 F (37 C)     Temp Source 05/04/21 1502 Oral     SpO2 05/04/21 1502 95 %   Constitutional: Alert and oriented. Well appearing and in no acute distress. Eyes: Conjunctivae are normal. PERRL. Head: Atraumatic. Nose: No congestion/rhinnorhea. Mouth/Throat: Mucous membranes are moist. No mouth lesions.  Neck: No stridor.   Cardiovascular: Mild tachycardia. Good peripheral circulation. Grossly normal heart sounds.   Respiratory: Normal respiratory effort.  No retractions. Lungs CTAB. Gastrointestinal: Soft and nontender. No distention.  Musculoskeletal: No lower extremity tenderness nor edema. No gross deformities of extremities. Neurologic:  Normal speech and language. No gross focal neurologic deficits are appreciated.  Skin:  Skin is warm and dry. Fine rash to the arms, legs, and abdomen. No petechiae. No induration or abscess.    ____________________________________________   LABS (all labs ordered are listed, but only abnormal results are displayed)  Labs Reviewed  COMPREHENSIVE METABOLIC PANEL - Abnormal; Notable for the following components:      Result Value   Sodium 132 (*)    Glucose, Bld 118 (*)    Creatinine, Ser 1.63 (*)    Total Protein 6.1 (*)    GFR, Estimated 40 (*)    All other components within normal limits  CBC WITH DIFFERENTIAL/PLATELET - Abnormal; Notable for the following components:   Platelets  147 (*)    Lymphs Abs 0.4 (*)    Eosinophils Absolute 0.7 (*)    All other components within normal limits  PROTIME-INR - Abnormal; Notable for the following components:   Prothrombin Time 17.2 (*)    INR 1.4 (*)    All other components within normal limits  URINALYSIS, ROUTINE W REFLEX MICROSCOPIC - Abnormal; Notable for the following components:   Specific Gravity, Urine >1.030 (*)    Bilirubin Urine SMALL (*)     All other components within normal limits  RESP PANEL BY RT-PCR (FLU A&B, COVID) ARPGX2  CULTURE, BLOOD (ROUTINE X 2)  CULTURE, BLOOD (ROUTINE X 2)  LACTIC ACID, PLASMA  LACTIC ACID, PLASMA   ____________________________________________  EKG   EKG Interpretation  Date/Time:  Wednesday May 04 2021 15:07:21 EST Ventricular Rate:  109 PR Interval:  152 QRS Duration: 80 QT Interval:  330 QTC Calculation: 444 R Axis:   -38 Text Interpretation: Sinus tachycardia with frequent Premature ventricular complexes Left axis deviation Abnormal ECG When compared with ECG of 17-Feb-2021 18:10, PREVIOUS ECG IS PRESENT Confirmed by Nanda Quinton [phone]) on 05/04/2021 11:53:51 PM        ____________________________________________  RADIOLOGY  DG Chest 2 View  Result Date: 05/04/2021 CLINICAL DATA:  Sepsis EXAM: CHEST - 2 VIEW COMPARISON:  03/03/2021 FINDINGS: The heart size and mediastinal contours are within normal limits. Both lungs are clear. The visualized skeletal structures are unremarkable. IMPRESSION: No active cardiopulmonary disease. Electronically Signed   By: Kathreen Devoid M.D.   On: 05/04/2021 15:54   CT Head Wo Contrast  Result Date: 05/04/2021 CLINICAL DATA:  Head trauma. EXAM: CT HEAD WITHOUT CONTRAST TECHNIQUE: Contiguous axial images were obtained from the base of the skull through the vertex without intravenous contrast. RADIATION DOSE REDUCTION: This exam was performed according to the departmental dose-optimization program which includes automated exposure control, adjustment of the mA and/or kV according to patient size and/or use of iterative reconstruction technique. COMPARISON:  None. FINDINGS: Brain: There is no evidence of an acute infarct, intracranial hemorrhage, mass, or midline shift. Slightly asymmetric extra-axial CSF over the left frontal and parietal convexities is favored to be due to cerebral atrophy although a tiny subdural hygroma is not excluded. Mild  cerebral atrophy is most notable in the parietal regions. Vascular: Calcified atherosclerosis at the skull base. No hyperdense vessel. Skull: No fracture or suspicious osseous lesion. Sinuses/Orbits: Visualized paranasal sinuses and mastoid air cells are clear. Bilateral cataract extraction. Other: None. IMPRESSION: No evidence of acute intracranial abnormality. Electronically Signed   By: Logan Bores M.D.   On: 05/04/2021 16:11    ____________________________________________   PROCEDURES  Procedure(s) performed:   Procedures  None  ____________________________________________   INITIAL IMPRESSION / ASSESSMENT AND PLAN / ED COURSE  Pertinent labs & imaging results that were available during my care of the patient were reviewed by me and considered in my medical decision making (see chart for details).   This patient is Presenting for Evaluation of weakness and rash, which does require a range of treatment options, and is a complaint that involves a high risk of morbidity and mortality.  The Differential Diagnoses include drug rash, sepsis, dehydration, ACS, metabolic encephalopathy.   Critical Interventions- IVF and labs along with review of head and chest imaging ordered during [facility] process.    Reassessment after intervention:    I did Additional Historical Information from patient's son at bedside. He confirms history, antibiotic use, and timeline of symptoms documented  above.   I decided to review pertinent External Data, and in summary recent admit in November 2022 with open knee wound s/p irrigation and debridement. F/u with Dr. Sharol Given on 12/8 with wound looking well. .   Clinical Laboratory Tests Ordered, included CBC, CMP, and INR from [facility]. No leukocytosis. Creatinine elevated above baseline to 1.63.   Radiologic Tests Ordered, included CT head and CXR. I independently evaluated the images and agree with radiology interpretation.   Cardiac Monitor Tracing which shows sinus  rhythm.    Reevaluation with update and discussion with patient after IVF. He is feeling well. We discussed his labs including UA and COVID/Flu which are negative. We discussed obs admit for IVF and monitoring of kidney function but he is feeling well and would like to be discharged. Will call his son for a ride. My overall suspicion for CVA in this case requiring MRI brain is very low.   Disposition: Discharge.   ____________________________________________  FINAL CLINICAL IMPRESSION(S) / ED DIAGNOSES  Final diagnoses:  Generalized weakness  Dehydration  Rash     MEDICATIONS GIVEN DURING THIS VISIT:  Medications  sodium chloride 0.9 % bolus 500 mL (0 mLs Intravenous Stopped 05/05/21 0145)     Note:  This document was prepared using Dragon voice recognition software and may include unintentional dictation errors.  Nanda Quinton, MD, [facility] Emergency Medicine    Bryleigh Ottaway, [facility], MD 05/05/21 [phone]

## 2021-05-05 DIAGNOSIS — R21 Rash and other nonspecific skin eruption: Secondary | ICD-10-CM | POA: Diagnosis not present

## 2021-05-05 DIAGNOSIS — E871 Hypo-osmolality and hyponatremia: Secondary | ICD-10-CM | POA: Diagnosis not present

## 2021-05-05 DIAGNOSIS — N179 Acute kidney failure, unspecified: Secondary | ICD-10-CM | POA: Diagnosis not present

## 2021-05-05 DIAGNOSIS — E860 Dehydration: Secondary | ICD-10-CM | POA: Diagnosis not present

## 2021-05-05 LAB — URINALYSIS, ROUTINE W REFLEX MICROSCOPIC
Glucose, UA: NEGATIVE mg/dL
Hgb urine dipstick: NEGATIVE
Ketones, ur: NEGATIVE mg/dL
Leukocytes,Ua: NEGATIVE
Nitrite: NEGATIVE
Protein, ur: NEGATIVE mg/dL
Specific Gravity, Urine: >1.03 — ABNORMAL HIGH (ref 1.005–1.030)
pH: 5.5 (ref 5.0–8.0)

## 2021-05-05 LAB — RESP PANEL BY RT-PCR (FLU A&B, COVID) ARPGX2
Influenza A by PCR: NEGATIVE
Influenza B by PCR: NEGATIVE
SARS Coronavirus 2 by RT PCR: NEGATIVE

## 2021-05-05 NOTE — ED Notes (Signed)
This RN called to update pt's son. Pt's son on the way to hospital.

## 2021-05-05 NOTE — ED Notes (Signed)
Patient verbalizes understanding of d/c instructions. Opportunities for questions and answers were provided. Pt d/c from ED and wheeled to lobby where son is there to pick him up.

## 2021-05-05 NOTE — Discharge Instructions (Signed)
You were seen in the emergency room today with generalized weakness.  I suspect that your rash is due to your recent antibiotics.  He should avoid taking this antibiotic in the future.  I would like for you to try and stay hydrated and call your primary care doctor later this morning to schedule a follow-up appointment in the coming week.  Your kidney labs were slightly worse than your prior lab work.  This will need to be repeated in the coming week to make sure your kidneys are improving after hydration here and at home.  If you develop any new or suddenly worsening symptoms he should return to the emergency department immediately for re-evaluation.

## 2021-05-09 LAB — CULTURE, BLOOD (ROUTINE X 2)
Culture: NO GROWTH
Special Requests: ADEQUATE

## 2021-05-10 LAB — CULTURE, BLOOD (ROUTINE X 2): Culture: NO GROWTH

## 2021-05-12 DIAGNOSIS — Z881 Allergy status to other antibiotic agents status: Secondary | ICD-10-CM | POA: Diagnosis not present

## 2021-05-12 DIAGNOSIS — E871 Hypo-osmolality and hyponatremia: Secondary | ICD-10-CM | POA: Diagnosis not present

## 2021-05-12 DIAGNOSIS — N401 Enlarged prostate with lower urinary tract symptoms: Secondary | ICD-10-CM | POA: Diagnosis not present

## 2021-05-12 DIAGNOSIS — R21 Rash and other nonspecific skin eruption: Secondary | ICD-10-CM | POA: Diagnosis not present

## 2021-05-12 DIAGNOSIS — N179 Acute kidney failure, unspecified: Secondary | ICD-10-CM | POA: Diagnosis not present

## 2021-06-02 ENCOUNTER — Other Ambulatory Visit: Payer: Self-pay | Admitting: Cardiovascular Disease

## 2021-06-02 DIAGNOSIS — R269 Unspecified abnormalities of gait and mobility: Secondary | ICD-10-CM | POA: Diagnosis not present

## 2021-06-02 DIAGNOSIS — M48061 Spinal stenosis, lumbar region without neurogenic claudication: Secondary | ICD-10-CM | POA: Diagnosis not present

## 2021-06-20 DIAGNOSIS — M48061 Spinal stenosis, lumbar region without neurogenic claudication: Secondary | ICD-10-CM | POA: Diagnosis not present

## 2021-06-24 DIAGNOSIS — M48061 Spinal stenosis, lumbar region without neurogenic claudication: Secondary | ICD-10-CM | POA: Diagnosis not present

## 2021-06-27 DIAGNOSIS — M48061 Spinal stenosis, lumbar region without neurogenic claudication: Secondary | ICD-10-CM | POA: Diagnosis not present

## 2021-06-30 DIAGNOSIS — K4090 Unilateral inguinal hernia, without obstruction or gangrene, not specified as recurrent: Secondary | ICD-10-CM | POA: Diagnosis not present

## 2021-06-30 DIAGNOSIS — I480 Paroxysmal atrial fibrillation: Secondary | ICD-10-CM | POA: Diagnosis not present

## 2021-06-30 DIAGNOSIS — G4700 Insomnia, unspecified: Secondary | ICD-10-CM | POA: Diagnosis not present

## 2021-06-30 DIAGNOSIS — N401 Enlarged prostate with lower urinary tract symptoms: Secondary | ICD-10-CM | POA: Diagnosis not present

## 2021-07-01 DIAGNOSIS — M48061 Spinal stenosis, lumbar region without neurogenic claudication: Secondary | ICD-10-CM | POA: Diagnosis not present

## 2021-07-04 DIAGNOSIS — M48061 Spinal stenosis, lumbar region without neurogenic claudication: Secondary | ICD-10-CM | POA: Diagnosis not present

## 2021-07-08 DIAGNOSIS — M48061 Spinal stenosis, lumbar region without neurogenic claudication: Secondary | ICD-10-CM | POA: Diagnosis not present

## 2021-07-11 DIAGNOSIS — M48061 Spinal stenosis, lumbar region without neurogenic claudication: Secondary | ICD-10-CM | POA: Diagnosis not present

## 2021-07-15 DIAGNOSIS — M48061 Spinal stenosis, lumbar region without neurogenic claudication: Secondary | ICD-10-CM | POA: Diagnosis not present

## 2021-07-15 DIAGNOSIS — I10 Essential (primary) hypertension: Secondary | ICD-10-CM | POA: Diagnosis not present

## 2021-07-15 DIAGNOSIS — E782 Mixed hyperlipidemia: Secondary | ICD-10-CM | POA: Diagnosis not present

## 2021-07-18 DIAGNOSIS — M48061 Spinal stenosis, lumbar region without neurogenic claudication: Secondary | ICD-10-CM | POA: Diagnosis not present

## 2021-07-22 DIAGNOSIS — M48061 Spinal stenosis, lumbar region without neurogenic claudication: Secondary | ICD-10-CM | POA: Diagnosis not present

## 2021-07-25 DIAGNOSIS — M48061 Spinal stenosis, lumbar region without neurogenic claudication: Secondary | ICD-10-CM | POA: Diagnosis not present

## 2021-07-29 DIAGNOSIS — M48061 Spinal stenosis, lumbar region without neurogenic claudication: Secondary | ICD-10-CM | POA: Diagnosis not present

## 2021-08-01 ENCOUNTER — Other Ambulatory Visit: Payer: Self-pay | Admitting: Cardiovascular Disease

## 2021-08-01 NOTE — Telephone Encounter (Signed)
Prescription refill request for Eliquis received. ?Indication:Afib ?Last office visit:12/22 ?Scr:1.1 ?Age: 88 ?Weight:92.5 kg ? ? ? ?

## 2021-08-11 ENCOUNTER — Ambulatory Visit: Payer: Self-pay | Admitting: Surgery

## 2021-08-11 DIAGNOSIS — K4090 Unilateral inguinal hernia, without obstruction or gangrene, not specified as recurrent: Secondary | ICD-10-CM | POA: Diagnosis not present

## 2021-08-11 NOTE — H&P (Signed)
? ? ? ?REFERRING PHYSICIAN:  Henrine Screws, MD ?  ?PROVIDER:  Dhairya Corales Charlotta Newton, MD ?  ?  ?Chief Complaint: New Consultation (Right inguinal hernia) ?  ?  ?  ?History of Present Illness: ?  ?Patient is referred by Dr. Marcellus Scott for surgical evaluation and management of right inguinal hernia.  Patient is known to my practice having undergone urgent repair of a recurrent left inguinal hernia with associated bowel obstruction in January 2021.  Patient did well with that procedure and has had no signs or symptoms of recurrence on the left side.  Over the past several months the patient has noted a gradual bulge in the right groin.  This causes some discomfort.  It is always reducible when he lies recumbent at night.  He has had some intermittent constipation and takes a laxative about once a week.  Patient has had no prior hernia repairs on the right side.  Patient was evaluated by his primary care physician and referred to general surgery for repair. ?  ?Patient has a history of atrial fibrillation.  He is on chronic anticoagulation with Eliquis. ?  ?  ?Review of Systems: ?A complete review of systems was obtained from the patient.  I have reviewed this information and discussed as appropriate with the patient.  See HPI as well for other ROS. ?  ?Review of Systems  ?Constitutional: Negative.   ?HENT: Negative.   ?Eyes: Negative.   ?Respiratory: Negative.   ?Cardiovascular: Negative.   ?Gastrointestinal: Positive for abdominal pain and constipation.  ?Genitourinary: Negative.   ?Musculoskeletal: Negative.   ?Skin: Negative.   ?Neurological: Negative.   ?Endo/Heme/Allergies: Negative.   ?Psychiatric/Behavioral: Negative.   ?  ?  ?  ?Medical History: ?Past Medical History  ?    ?Past Medical History:  ?Diagnosis Date  ? Arrhythmia    ? Arthritis    ?  ?  ?  ?   ?Patient Active Problem List  ?Diagnosis  ? Non-recurrent unilateral inguinal hernia without obstruction or gangrene  ?  ?  ?Past Surgical History  ?      ?Past Surgical History:  ?Procedure Laterality Date  ? HERNIA REPAIR      ? knee surgery      ?  ?  ?  ?Allergies  ?    ?Allergies  ?Allergen Reactions  ? Bee Venom Protein (Honey Bee) Anaphylaxis  ? Doxycycline Anaphylaxis and Other (See Comments)  ? Atorvastatin Other (See Comments)  ? Finasteride Other (See Comments)  ? Sulfamethoxazole Other (See Comments)  ? Sulfamethoxazole-Trimethoprim Other (See Comments)  ? Xarelto [Rivaroxaban] Rash  ? Yellow Jacket Venom Other (See Comments)  ?  ?  ?  ?      ?Current Outpatient Medications on File Prior to Visit  ?Medication Sig Dispense Refill  ? apixaban (ELIQUIS) 5 mg tablet Eliquis 5 mg tablet ? TAKE 1 TABLET BY MOUTH 2 TIMES DAILY.      ? cholecalciferol (VITAMIN D3) 2,000 unit tablet Take by mouth      ? cinnamon bark (CINNAMON) 500 mg capsule 2 capsules once daily      ? co-enzyme Q-10, ubiquinone, 100 mg capsule 1 capsule with a meal      ? cyanocobalamin (VITAMIN B12) 1,000 mcg/mL injection Inject into the muscle monthly      ? EPINEPHrine (EPIPEN) 0.3 mg/0.3 mL auto-injector epinephrine 0.3 mg/0.3 mL injection, auto-injector ? INJECT AS NEEDED INTRAMUSCULAR FOR BEE / YELLOW JACKET STINGS      ?  eszopiclone (LUNESTA) 1 MG tablet 1 tablet immediately before bedtime      ? levothyroxine (SYNTHROID) 50 MCG tablet 1 tablet in the morning on an empty stomach      ? tamsulosin (FLOMAX) 0.4 mg capsule Take 1 capsule by mouth at bedtime      ?  ?No current facility-administered medications on file prior to visit.  ?  ?  ?Family History  ?History reviewed. No pertinent family history.  ?  ?  ?Social History  ?  ?   ?Tobacco Use  ?Smoking Status Never  ?Smokeless Tobacco Never  ?  ?  ?Social History  ?Social History  ?  ?    ?Socioeconomic History  ? Marital status: Married  ?Tobacco Use  ? Smoking status: Never  ? Smokeless tobacco: Never  ?Vaping Use  ? Vaping Use: Never used  ?Substance and Sexual Activity  ? Alcohol use: Yes  ? Drug use: Never  ?  ?  ?  ?Objective:   ?  ?  ?   ?Vitals:  ?  08/11/21 1536  ?BP: 122/78  ?Pulse: 81  ?Temp: 36.7 ?C (98.1 ?F)  ?SpO2: 98%  ?Weight: 91.6 kg (202 lb)  ?Height: 182.9 cm (6')  ?  ?Body mass index is 27.4 kg/m?. ?  ?Physical Exam  ?  ?GENERAL APPEARANCE ?Comfortable, no acute issues ?Development: normal ?Gross deformities: none ?  ?SKIN ?Rash, lesions, ulcers: none ?Induration, erythema: none ?Nodules: none palpable ?  ?EYES ?Conjunctiva and lids: normal ?Pupils: equal and reactive ?  ?EARS, NOSE, MOUTH, THROAT ?External ears: no lesion or deformity ?External nose: no lesion or deformity ?Hearing: grossly normal ?  ?NECK ?Symmetric: yes ?Trachea: midline ?Thyroid: no palpable nodules in the thyroid bed ?  ?CHEST ?Respiratory effort: normal ?Retraction or accessory muscle use: no ?Breath sounds: normal bilaterally ?Rales, rhonchi, wheeze: none ?  ?CARDIOVASCULAR ?Auscultation: regular rhythm, normal rate ?Murmurs: none ?Pulses: radial pulse 2+ palpable ?Lower extremity edema: none ?  ?ABDOMEN ?Soft without distention ?Nontender ?  ?GENITOURINARY ?Well-healed left inguinal incision.  Palpation in the left inguinal canal with cough and Valsalva shows no sign of recurrence. ?There is a visible bulge on the right side.  On palpation this represents a direct inguinal hernia.  It augments with cough and Valsalva.  With manipulation it is mostly reducible even in a standing position. ?  ?MUSCULOSKELETAL ?Station and gait: normal ?Digits and nails: no clubbing or cyanosis ?Muscle strength: grossly normal all extremities ?Range of motion: grossly normal all extremities ?Deformity: none ?  ?LYMPHATIC ?Cervical: none palpable ?Supraclavicular: none palpable ?  ?PSYCHIATRIC ?Oriented to person, place, and time: yes ?Mood and affect: normal for situation ?Judgment and insight: appropriate for situation ?  ?  ?  ?Assessment and Plan:  ?Diagnoses and all orders for this visit: ?  ?Non-recurrent unilateral inguinal hernia without obstruction or gangrene ?   ?  ?Patient presents today on referral from his primary care physician for surgical repair of right inguinal hernia.  Patient is known to my practice having undergone previous left inguinal hernia repair for a recurrence. ?  ?Patient is presented with written literature on hernia surgery to review at home.  Today we reviewed this material in detail.  We discussed the use of prosthetic mesh.  We discussed doing this as an outpatient procedure.  We discussed restrictions on his activities after the surgery.  We discussed the possibility of recurrence.  The patient understands and wishes to proceed with surgery in the near  future. ?  ?Patient is on anticoagulation with Eliquis for chronic atrial fibrillation.  We will request cardiac clearance from his cardiologist at [facility].  We will arrange for preoperative anesthesia evaluation. ?  ?The risks and benefits of the procedure have been discussed at length with the patient.  The patient understands the proposed procedure, potential alternative treatments, and the course of recovery to be expected.  All of the patient's questions have been answered at this time.  The patient wishes to proceed with surgery. ?  ?+++++++++++++++++++++++++++++++++++++++++++++ ?  ?You are being scheduled for surgery. ?  ?You should hear from our office's scheduling department within 3 business days about the location, date, and time of surgery. ?  ?We try to make accommodations for patient's preferences in scheduling surgery, but sometimes the Operating Room's schedule or Dr. Gala Lewandowsky schedule prevents [place] from making those accommodations. ?  ?If you have not heard from our office within 3 business days, call the office and ask for Dr. Gala Lewandowsky nurse Claiborne Billings). ?  ?[facility] OFFICE: [phone] ?  ? ?Armandina Gemma MD ?[facility] ?Office: [phone] ?  ?  ? ?

## 2021-08-23 DIAGNOSIS — N3943 Post-void dribbling: Secondary | ICD-10-CM | POA: Diagnosis not present

## 2021-08-23 DIAGNOSIS — R350 Frequency of micturition: Secondary | ICD-10-CM | POA: Diagnosis not present

## 2021-08-23 DIAGNOSIS — R3912 Poor urinary stream: Secondary | ICD-10-CM | POA: Diagnosis not present

## 2021-08-23 DIAGNOSIS — R351 Nocturia: Secondary | ICD-10-CM | POA: Diagnosis not present

## 2021-08-29 ENCOUNTER — Encounter (HOSPITAL_COMMUNITY): Payer: Self-pay

## 2021-08-29 NOTE — Patient Instructions (Addendum)
DUE TO COVID-19 ONLY TWO VISITORS  (aged 86 and older)  ARE ALLOWED TO COME WITH YOU AND STAY IN THE WAITING ROOM ONLY DURING PRE OP AND PROCEDURE.   ?**NO VISITORS ARE ALLOWED IN THE SHORT STAY AREA OR RECOVERY ROOM!!** ? ?IF YOU WILL BE ADMITTED INTO THE HOSPITAL YOU ARE ALLOWED ONLY FOUR SUPPORT PEOPLE DURING VISITATION HOURS ONLY (7 AM -8PM)   ?The support person(s) must pass our screening, gel in and out, and wear a mask at all times, including in the patient?s room. ?Patients must also wear a mask when staff or their support person are in the room. ?Visitors GUEST BADGE MUST BE WORN VISIBLY  ?One adult visitor may remain with you overnight and MUST be in the room by 8 P.M. ?  ? ? Your procedure is scheduled on: 09/12/21 ? ? Report to [facility] Main Entrance ? ?  Report to admitting at   9:15 AM ? ? Call this number if you have problems the morning of surgery [phone] ? ? Do not eat food :After Midnight. ? ? After Midnight you may have the following liquids until _8:30_AM/  DAY OF SURGERY ? ?Water ?Black Coffee (sugar ok, NO MILK/CREAM OR CREAMERS)  ?Tea (sugar ok, NO MILK/CREAM OR CREAMERS) regular and decaf                             ?Plain Jell-O (NO RED)                                           ?Fruit ices (not with fruit pulp, NO RED)                                     ?Popsicles (NO RED)                                                                  ?Juice: apple, WHITE grape, WHITE cranberry ?Sports drinks like Gatorade (NO RED) ?Clear broth(vegetable,chicken,beef) ? ?              ?       If you have questions, please contact your surgeon?s office. ? ? ?  ?  ?Oral Hygiene is also important to reduce your risk of infection.                                    ?Remember - BRUSH YOUR TEETH THE MORNING OF SURGERY WITH YOUR REGULAR TOOTHPASTE ? ? Do NOT smoke after Midnight ? ? Take these medicines the morning of surgery with A SIP OF WATER: Finasteride, Levothyroxine ? ? ?Bring CPAP mask  and tubing day of surgery. ?                  ?           You may not have any metal on your body including jewelry, and body piercing ? ?  Do not wear  lotions, powders, perfumes/cologne, or deodorant ? ? ? ?            Men may shave face and neck. ? ? Do not bring valuables to the hospital. [facility] NOT ?            RESPONSIBLE   FOR VALUABLES. ? ? Contacts, dentures or bridgework may not be worn into surgery. ? ?  ? Patients discharged on the day of surgery will not be allowed to drive home.  Someone NEEDS to stay with you for the first 24 hours after anesthesia. ? ? Special Instructions: Bring a copy of your healthcare power of attorney and living will documents the day of surgery if you haven't scanned them before. ? ?            Please read over the following fact sheets you were given: IF [place] [phone] ? ?   [facility] - Preparing for Surgery ?Before surgery, you can play an important role.  Because skin is not sterile, your skin needs to be as free of germs as possible.  You can reduce the number of germs on your skin by washing with CHG (chlorahexidine gluconate) soap before surgery.  CHG is an antiseptic cleaner which kills germs and bonds with the skin to continue killing germs even after washing. ?Please DO NOT use if you have an allergy to CHG or antibacterial soaps.  If your skin becomes reddened/irritated stop using the CHG and inform your nurse when you arrive at Short Stay.  You may shave your face/neck. ?Please follow these instructions carefully: ? 1.  Shower with CHG Soap the night before surgery and the  morning of Surgery. ? 2.  If you choose to wash your hair, wash your hair first as usual with your  normal  shampoo. ? 3.  After you shampoo, rinse your hair and body thoroughly to remove the  shampoo.                 ?           4.  Use CHG as you would any other liquid soap.  You can apply chg directly  to the skin  and wash  ?                     Gently with a scrungie or clean washcloth. ? 5.  Apply the CHG Soap to your body ONLY FROM THE NECK DOWN.   Do not use on face/ open      ?                     Wound or open sores. Avoid contact with eyes, ears mouth and genitals (private parts).  ?                     Production manager,  Genitals (private parts) with your normal soap. ?            6.  Wash thoroughly, paying special attention to the area where your surgery  will be performed. ? 7.  Thoroughly rinse your body with warm water from the neck down. ? 8.  DO NOT shower/wash with your normal soap after using and rinsing off  the CHG Soap. ?               9.  Pat yourself dry with  a clean towel. ?           10.  Wear clean pajamas. ?           11.  Place clean sheets on your bed the night of your first shower and do not  sleep with pets. ?Day of Surgery : ?Do not apply any lotions/deodorants the morning of surgery.  Please wear clean clothes to the hospital/surgery center. ? ?FAILURE TO FOLLOW THESE INSTRUCTIONS MAY RESULT IN THE CANCELLATION OF YOUR SURGERY ?PATIENT SIGNATURE_________________________________ ? ?NURSE SIGNATURE__________________________________ ? ?________________________________________________________________________  ?

## 2021-08-30 ENCOUNTER — Encounter (HOSPITAL_COMMUNITY)
Admission: RE | Admit: 2021-08-30 | Discharge: 2021-08-30 | Disposition: A | Discharge status: Home / Self Care | Payer: Medicare Other | Source: Ambulatory Visit | Attending: Surgery | Admitting: Surgery

## 2021-08-30 ENCOUNTER — Other Ambulatory Visit: Payer: Self-pay

## 2021-08-30 ENCOUNTER — Encounter (HOSPITAL_COMMUNITY): Payer: Self-pay

## 2021-08-30 DIAGNOSIS — Z01812 Encounter for preprocedural laboratory examination: Secondary | ICD-10-CM | POA: Diagnosis not present

## 2021-08-30 DIAGNOSIS — I10 Essential (primary) hypertension: Secondary | ICD-10-CM | POA: Insufficient documentation

## 2021-08-30 HISTORY — DX: Cardiac arrhythmia, unspecified: I49.9

## 2021-08-30 HISTORY — DX: Benign prostatic hyperplasia without lower urinary tract symptoms: N40.0

## 2021-08-30 LAB — CBC
HCT: 46.3 % (ref 39.0–52.0)
Hemoglobin: 15.3 g/dL (ref 13.0–17.0)
MCH: 31.7 pg (ref 26.0–34.0)
MCHC: 33 g/dL (ref 30.0–36.0)
MCV: 96.1 fL (ref 80.0–100.0)
Platelets: 195 10*3/uL (ref 150–400)
RBC: 4.82 MIL/uL (ref 4.22–5.81)
RDW: 13.4 % (ref 11.5–15.5)
WBC: 5.9 10*3/uL (ref 4.0–10.5)
nRBC: 0 % (ref 0.0–0.2)

## 2021-08-30 LAB — BASIC METABOLIC PANEL
Anion gap: 7 (ref 5–15)
BUN: 18 mg/dL (ref 8–23)
CO2: 24 mmol/L (ref 22–32)
Calcium: 9.5 mg/dL (ref 8.9–10.3)
Chloride: 108 mmol/L (ref 98–111)
Creatinine, Ser: 1.25 mg/dL — ABNORMAL HIGH (ref 0.61–1.24)
GFR, Estimated: 55 mL/min — ABNORMAL LOW (ref >60)
Glucose, Bld: 101 mg/dL — ABNORMAL HIGH (ref 70–99)
Potassium: 4.5 mmol/L (ref 3.5–5.1)
Sodium: 139 mmol/L (ref 135–145)

## 2021-08-30 NOTE — Progress Notes (Signed)
Anesthesia note: ? ?Bowel prep reminder:NA ? ?PCP - Dr. Brayton Layman ?Cardiologist -Dr. Jerilynn Mages. Cro[place] ?Other-  ? ?Chest x-ray - 05/04/21-epic ?EKG - 05/05/21-epic ?Stress Test - 2015 ?ECHO - 2015 ?Cardiac Cath - NA ? ?Pacemaker/ICD device last checked:NA ? ?Sleep Study - no ?CPAP -  ? ?Pt is pre diabetic-NA ?Fasting Blood Sugar -  ?Checks Blood Sugar _____ ? ?Blood Thinner:Eliquis foe A-fib/ Dr. Sallyanne Kuster ?Blood Thinner Instructions:stop 3 days prior to DOS/ Dr. Sallyanne Kuster. ?Aspirin Instructions: ?Last Dose:09/08/21 ? ?Anesthesia review: yes ? ?Patient denies shortness of breath, fever, cough and chest pain at PAT appointment ?Pt has no SOB gait is slightly unsteady. ? ?Patient verbalized understanding of instructions that were given to them at the PAT appointment. Patient was also instructed that they will need to review over the PAT instructions again at home before surgery. yes ?

## 2021-08-31 DIAGNOSIS — N401 Enlarged prostate with lower urinary tract symptoms: Secondary | ICD-10-CM | POA: Diagnosis not present

## 2021-08-31 DIAGNOSIS — I480 Paroxysmal atrial fibrillation: Secondary | ICD-10-CM | POA: Diagnosis not present

## 2021-08-31 DIAGNOSIS — E039 Hypothyroidism, unspecified: Secondary | ICD-10-CM | POA: Diagnosis not present

## 2021-08-31 DIAGNOSIS — E782 Mixed hyperlipidemia: Secondary | ICD-10-CM | POA: Diagnosis not present

## 2021-08-31 DIAGNOSIS — I10 Essential (primary) hypertension: Secondary | ICD-10-CM | POA: Diagnosis not present

## 2021-09-02 NOTE — Anesthesia Preprocedure Evaluation (Addendum)
Anesthesia Evaluation  Patient identified by MRN, date of birth, ID band Patient awake    Reviewed: Allergy & Precautions, NPO status , Patient's Chart, lab work & pertinent test results  Airway Mallampati: II  TM Distance: >3 FB Neck ROM: Full    Dental no notable dental hx.    Pulmonary neg pulmonary ROS,    Pulmonary exam normal breath sounds clear to auscultation       Cardiovascular hypertension, Pt. on medications Normal cardiovascular exam+ dysrhythmias Atrial Fibrillation  Rhythm:Regular Rate:Normal     Neuro/Psych negative neurological ROS  negative psych ROS   GI/Hepatic Neg liver ROS, GERD  ,  Endo/Other  Hypothyroidism   Renal/GU negative Renal ROS  negative genitourinary   Musculoskeletal negative musculoskeletal ROS (+)   Abdominal   Peds negative pediatric ROS (+)  Hematology negative hematology ROS (+)   Anesthesia Other Findings   Reproductive/Obstetrics negative OB ROS                            Anesthesia Physical Anesthesia Plan  ASA: 3  Anesthesia Plan: General   Post-op Pain Management: Tylenol PO (pre-op)*   Induction: Intravenous  PONV Risk Score and Plan: 2 and Ondansetron, Dexamethasone and Treatment may vary due to age or medical condition  Airway Management Planned: LMA and Oral ETT  Additional Equipment:   Intra-op Plan:   Post-operative Plan: Extubation in OR  Informed Consent: I have reviewed the patients History and Physical, chart, labs and discussed the procedure including the risks, benefits and alternatives for the proposed anesthesia with the patient or authorized representative who has indicated his/her understanding and acceptance.     Dental advisory given  Plan Discussed with: CRNA and Surgeon  Anesthesia Plan Comments: (See PAT note 08/30/2021  Will ask surgeon if patient needs paralysis)       Anesthesia Quick  Evaluation

## 2021-09-02 NOTE — Progress Notes (Signed)
Anesthesia Chart Review ? ? Case: [id number] Date/Time: 09/12/21 1115  ? Procedure: OPEN REPAIR RIGHT INGUINAL HERNIA WITH MESH (Right)  ? Anesthesia type: General  ? Pre-op diagnosis: RIGHT INGUINAL HERNIA, REDUCIBLE  ? Location: [facility] ROOM 01 / [facility]  ? Surgeons: Armandina Gemma, MD  ? ?  ? ? ?DISCUSSION:88 y.o. never smoker with h/o HTN, PAF (on Eliquis), right inguinal hernia scheduled for above procedure 09/12/2021 with Dr. Armandina Gemma.  ? ?Dilated aortic root. Per cardiology, "Asymptomatic.  Mild (max 44 mm).  Since is asymptomatic and is now 88 years old, we have decided not to perform routine monitoring since it is unlikely we would ever recommend surgery for an asymptomatic aneurysm." ? ?Advised to hold Eliquis 3 days prior to procedure.  ? ?Anticipate pt can proceed with planned procedure barring acute status change.   ?VS: BP (!) 143/99   Pulse 70   Temp 36.5 ?C (Oral)   Resp 16   Ht 6' (1.829 m)   Wt 90.7 kg   SpO2 95%   BMI 27.12 kg/m?  ? ?PROVIDERS: ?Josetta Huddle, MD is PCP  ? ?Cardiologist:  Sanda Klein, MD   ?LABS: Labs reviewed: Acceptable for surgery. ?(all labs ordered are listed, but only abnormal results are displayed) ? ?Labs Reviewed  ?BASIC METABOLIC PANEL - Abnormal; Notable for the following components:  ?    Result Value  ? Glucose, Bld 101 (*)   ? Creatinine, Ser 1.25 (*)   ? GFR, Estimated 55 (*)   ? All other components within normal limits  ?CBC  ? ? ? ?IMAGES: ? ? ?EKG: ?05/05/2021 ?Rate 109 bpm  ?Sinus tachycardia with frequent PVCs ?LAD ? ?CV: ?Echo 09/23/2013 ?Study Conclusions  ? ?- Left ventricle: The cavity size was normal. Wall thickness was  ?  normal. Systolic function was normal. The estimated ejection  ?  fraction was in the range of 55% to 60%.  ?- Aortic valve: There was mild regurgitation.  ?- Aorta: Ascending aortic root moderately dilated.  ?- Left atrium: The atrium was moderately dilated.  ?- Right atrium: The atrium was moderately dilated.  ?- Atrial septum: No  defect or patent foramen ovale was identified.   ?Past Medical History:  ?Diagnosis Date  ? BPH (benign prostatic hyperplasia)   ? Cardiomegaly   ? Dilated aortic root ([place])   ? a. 09/2013: 4.2cm by echo.  ? Dysrhythmia   ? Enlarged prostate without lower urinary tract symptoms (luts)   ? GERD (gastroesophageal reflux disease)   ? rare  ? History of hiatal hernia   ? Hyperlipidemia   ? Hypertension   ? Hypothyroidism   ? Insomnia   ? Junctional bradycardia   ? OA (osteoarthritis)   ? Obesity   ? Paroxysmal A-fib ([place])   ? a. 09/23/13 a-fib with RVR and hypotensive, requiring emergent cardioversion in ED  ? Peripheral neuropathy 07/17/2017  ? Vitamin D deficiency   ? ? ?Past Surgical History:  ?Procedure Laterality Date  ? CARDIOVERSION  2015  ? CARPAL TUNNEL RELEASE Left 2019  ? I & D EXTREMITY Left 02/18/2021  ? Procedure: IRRIGATION AND DEBRIDEMENT KNEE;  Surgeon: Newt Minion, MD;  Location: [facility];  Service: Orthopedics;  Laterality: Left;  ? INGUINAL HERNIA REPAIR Left 05/03/2019  ? Procedure: HERNIA REPAIR INGUINAL ADULT WITH MESH;  Surgeon: Armandina Gemma, MD;  Location: [facility];  Service: General;  Laterality: Left;  ? INGUINAL HERNIA REPAIR Left 1980  ? TOTAL KNEE ARTHROPLASTY  Left 07/20/2017  ? Procedure: LEFT TOTAL KNEE ARTHROPLASTY;  Surgeon: Sydnee Cabal, MD;  Location: [facility];  Service: Orthopedics;  Laterality: Left;  Adductor Block  ? TRANSTHORACIC ECHOCARDIOGRAM  09/23/2013  ? EF 55-60%; normal wall motion. Moderately dilated aortic root and ascending aorta; mild AI; moderate bi-atrial enlargement  ? ? ?MEDICATIONS: ? acetaminophen (TYLENOL) 500 MG tablet  ? apixaban (ELIQUIS) 5 MG TABS tablet  ? bisacodyl (DULCOLAX) 10 MG suppository  ? Cholecalciferol (VITAMIN D3) 2000 units TABS  ? CINNAMON PO  ? Coenzyme Q10 (CO Q10) 100 MG CAPS  ? cyanocobalamin 1000 MCG tablet  ? EPINEPHrine 0.3 mg/0.3 mL IJ SOAJ injection  ? eszopiclone (LUNESTA) 1 MG TABS tablet  ? finasteride (PROSCAR) 5 MG tablet  ?  levothyroxine (SYNTHROID) 50 MCG tablet  ? Multiple Vitamin (MULTIVITAMIN WITH MINERALS) TABS  ? OVER THE COUNTER MEDICATION  ? oxyCODONE-acetaminophen (PERCOCET/ROXICET) 5-325 MG tablet  ? polyethylene glycol (MIRALAX / GLYCOLAX) 17 g packet  ? Tamsulosin HCl (FLOMAX) 0.4 MG CAPS  ? ?No current facility-administered medications for this encounter.  ? ? ?Konrad Felix Ward, PA-C ?[facility] ?[phone] ? ? ? ? ? ? ?

## 2021-09-11 ENCOUNTER — Encounter (HOSPITAL_COMMUNITY): Payer: Self-pay | Admitting: Surgery

## 2021-09-11 DIAGNOSIS — K4090 Unilateral inguinal hernia, without obstruction or gangrene, not specified as recurrent: Secondary | ICD-10-CM

## 2021-09-11 NOTE — H&P (Signed)
REFERRING PHYSICIAN: Henrine Screws, MD  PROVIDER: Quantisha Marsicano Charlotta Newton, MD    Chief Complaint: New Consultation (Right inguinal hernia)   History of Present Illness:  Patient is referred by Dr. Marcellus Scott for surgical evaluation and management of right inguinal hernia. Patient is known to my practice having undergone urgent repair of a recurrent left inguinal hernia with associated bowel obstruction in January 2021. Patient did well with that procedure and has had no signs or symptoms of recurrence on the left side. Over the past several months the patient has noted a gradual bulge in the right groin. This causes some discomfort. It is always reducible when he lies recumbent at night. He has had some intermittent constipation and takes a laxative about once a week. Patient has had no prior hernia repairs on the right side. Patient was evaluated by his primary care physician and referred to general surgery for repair.  Patient has a history of atrial fibrillation. He is on chronic anticoagulation with Eliquis.  Review of Systems: A complete review of systems was obtained from the patient. I have reviewed this information and discussed as appropriate with the patient. See HPI as well for other ROS.  Review of Systems  Constitutional: Negative.  HENT: Negative.  Eyes: Negative.  Respiratory: Negative.  Cardiovascular: Negative.  Gastrointestinal: Positive for abdominal pain and constipation.  Genitourinary: Negative.  Musculoskeletal: Negative.  Skin: Negative.  Neurological: Negative.  Endo/Heme/Allergies: Negative.  Psychiatric/Behavioral: Negative.    Medical History: Past Medical History:  Diagnosis Date   Arrhythmia   Arthritis   Patient Active Problem List  Diagnosis   Non-recurrent unilateral inguinal hernia without obstruction or gangrene   Past Surgical History:  Procedure Laterality Date   HERNIA REPAIR   knee surgery    Allergies  Allergen  Reactions   Bee Venom Protein (Honey Bee) Anaphylaxis   Doxycycline Anaphylaxis and Other (See Comments)   Atorvastatin Other (See Comments)   Finasteride Other (See Comments)   Sulfamethoxazole Other (See Comments)   Sulfamethoxazole-Trimethoprim Other (See Comments)   Xarelto [Rivaroxaban] Rash   Yellow Jacket Venom Other (See Comments)   Current Outpatient Medications on File Prior to Visit  Medication Sig Dispense Refill   apixaban (ELIQUIS) 5 mg tablet Eliquis 5 mg tablet TAKE 1 TABLET BY MOUTH 2 TIMES DAILY.   cholecalciferol (VITAMIN D3) 2,000 unit tablet Take by mouth   cinnamon bark (CINNAMON) 500 mg capsule 2 capsules once daily   co-enzyme Q-10, ubiquinone, 100 mg capsule 1 capsule with a meal   cyanocobalamin (VITAMIN B12) 1,000 mcg/mL injection Inject into the muscle monthly   EPINEPHrine (EPIPEN) 0.3 mg/0.3 mL auto-injector epinephrine 0.3 mg/0.3 mL injection, auto-injector INJECT AS NEEDED INTRAMUSCULAR FOR BEE / YELLOW JACKET STINGS   eszopiclone (LUNESTA) 1 MG tablet 1 tablet immediately before bedtime   levothyroxine (SYNTHROID) 50 MCG tablet 1 tablet in the morning on an empty stomach   tamsulosin (FLOMAX) 0.4 mg capsule Take 1 capsule by mouth at bedtime   No current facility-administered medications on file prior to visit.   History reviewed. No pertinent family history.   Social History   Tobacco Use  Smoking Status Never  Smokeless Tobacco Never    Social History   Socioeconomic History   Marital status: Married  Tobacco Use   Smoking status: Never   Smokeless tobacco: Never  Vaping Use   Vaping Use: Never used  Substance and Sexual Activity   Alcohol use: Yes   Drug  use: Never   Objective:   Vitals:  BP: 122/78  Pulse: 81  Temp: 36.7 C (98.1 F)  SpO2: 98%  Weight: 91.6 kg (202 lb)  Height: 182.9 cm (6')   Body mass index is 27.4 kg/m.  Physical Exam   GENERAL APPEARANCE Comfortable, no acute issues Development: normal Gross  deformities: none  SKIN Rash, lesions, ulcers: none Induration, erythema: none Nodules: none palpable  EYES Conjunctiva and lids: normal Pupils: equal and reactive  EARS, NOSE, MOUTH, THROAT External ears: no lesion or deformity External nose: no lesion or deformity Hearing: grossly normal  NECK Symmetric: yes Trachea: midline Thyroid: no palpable nodules in the thyroid bed  CHEST Respiratory effort: normal Retraction or accessory muscle use: no Breath sounds: normal bilaterally Rales, rhonchi, wheeze: none  CARDIOVASCULAR Auscultation: regular rhythm, normal rate Murmurs: none Pulses: radial pulse 2+ palpable Lower extremity edema: none  ABDOMEN Soft without distention Nontender  GENITOURINARY Well-healed left inguinal incision. Palpation in the left inguinal canal with cough and Valsalva shows no sign of recurrence. There is a visible bulge on the right side. On palpation this represents a direct inguinal hernia. It augments with cough and Valsalva. With manipulation it is mostly reducible even in a standing position.  MUSCULOSKELETAL Station and gait: normal Digits and nails: no clubbing or cyanosis Muscle strength: grossly normal all extremities Range of motion: grossly normal all extremities Deformity: none  LYMPHATIC Cervical: none palpable Supraclavicular: none palpable  PSYCHIATRIC Oriented to person, place, and time: yes Mood and affect: normal for situation Judgment and insight: appropriate for situation   Assessment and Plan:   Non-recurrent unilateral inguinal hernia without obstruction or gangrene  Patient presents today on referral from his primary care physician for surgical repair of right inguinal hernia. Patient is known to my practice having undergone previous left inguinal hernia repair for a recurrence.  Patient is presented with written literature on hernia surgery to review at home. Today we reviewed this material in detail. We  discussed the use of prosthetic mesh. We discussed doing this as an outpatient procedure. We discussed restrictions on his activities after the surgery. We discussed the possibility of recurrence. The patient understands and wishes to proceed with surgery in the near future.  Patient is on anticoagulation with Eliquis for chronic atrial fibrillation. We will request cardiac clearance from his cardiologist at [facility]. We will arrange for preoperative anesthesia evaluation.  The risks and benefits of the procedure have been discussed at length with the patient. The patient understands the proposed procedure, potential alternative treatments, and the course of recovery to be expected. All of the patient's questions have been answered at this time. The patient wishes to proceed with surgery.   Armandina Gemma, MD [facility] [place] [facility] Office: [phone]

## 2021-09-12 ENCOUNTER — Encounter (HOSPITAL_COMMUNITY)
Admission: RE | Disposition: A | Discharge status: Home / Self Care | Payer: Self-pay | Source: Ambulatory Visit | Attending: Surgery

## 2021-09-12 ENCOUNTER — Ambulatory Visit (HOSPITAL_COMMUNITY)
Admission: RE | Admit: 2021-09-12 | Discharge: 2021-09-12 | Disposition: A | Discharge status: Home / Self Care | Payer: Medicare Other | Source: Ambulatory Visit | Attending: Surgery | Admitting: Surgery

## 2021-09-12 ENCOUNTER — Ambulatory Visit (HOSPITAL_COMMUNITY): Payer: Medicare Other | Admitting: Physician Assistant

## 2021-09-12 ENCOUNTER — Encounter (HOSPITAL_COMMUNITY): Payer: Self-pay | Admitting: Surgery

## 2021-09-12 ENCOUNTER — Other Ambulatory Visit: Payer: Self-pay

## 2021-09-12 ENCOUNTER — Ambulatory Visit (HOSPITAL_BASED_OUTPATIENT_CLINIC_OR_DEPARTMENT_OTHER): Payer: Medicare Other | Admitting: Anesthesiology

## 2021-09-12 DIAGNOSIS — I4891 Unspecified atrial fibrillation: Secondary | ICD-10-CM

## 2021-09-12 DIAGNOSIS — E039 Hypothyroidism, unspecified: Secondary | ICD-10-CM | POA: Diagnosis not present

## 2021-09-12 DIAGNOSIS — K4090 Unilateral inguinal hernia, without obstruction or gangrene, not specified as recurrent: Secondary | ICD-10-CM

## 2021-09-12 DIAGNOSIS — I10 Essential (primary) hypertension: Secondary | ICD-10-CM | POA: Diagnosis not present

## 2021-09-12 DIAGNOSIS — Z7901 Long term (current) use of anticoagulants: Secondary | ICD-10-CM | POA: Diagnosis not present

## 2021-09-12 DIAGNOSIS — K5900 Constipation, unspecified: Secondary | ICD-10-CM | POA: Insufficient documentation

## 2021-09-12 HISTORY — PX: INGUINAL HERNIA REPAIR: SHX194

## 2021-09-12 SURGERY — REPAIR, HERNIA, INGUINAL, ADULT
Anesthesia: General | Laterality: Right

## 2021-09-12 MED ORDER — ACETAMINOPHEN 500 MG PO TABS
1000.0000 mg | ORAL_TABLET | Freq: Once | ORAL | Status: AC
  Administered 2021-09-12: 1000 mg via ORAL
  Filled 2021-09-12: qty 2

## 2021-09-12 MED ORDER — OXYCODONE HCL 5 MG PO TABS: 5.0000 mg | ORAL_TABLET | Freq: Once | ORAL | Status: DC | PRN

## 2021-09-12 MED ORDER — DEXAMETHASONE SODIUM PHOSPHATE 4 MG/ML IJ SOLN
INTRAMUSCULAR | Status: DC | PRN
Start: 2021-09-12 — End: 2021-09-12
  Administered 2021-09-12: 8 mg via INTRAVENOUS

## 2021-09-12 MED ORDER — BUPIVACAINE LIPOSOME 1.3 % IJ SUSP
INTRAMUSCULAR | Status: AC
  Filled 2021-09-12: qty 20

## 2021-09-12 MED ORDER — ROCURONIUM BROMIDE 10 MG/ML (PF) SYRINGE
PREFILLED_SYRINGE | INTRAVENOUS | Status: DC | PRN
  Administered 2021-09-12: 100 mg via INTRAVENOUS

## 2021-09-12 MED ORDER — ORAL CARE MOUTH RINSE: 15.0000 mL | Freq: Once | OROMUCOSAL | Status: AC

## 2021-09-12 MED ORDER — ONDANSETRON HCL 4 MG/2ML IJ SOLN
INTRAMUSCULAR | Status: AC
  Filled 2021-09-12: qty 2

## 2021-09-12 MED ORDER — PHENYLEPHRINE 80 MCG/ML (10ML) SYRINGE FOR IV PUSH (FOR BLOOD PRESSURE SUPPORT)
PREFILLED_SYRINGE | INTRAVENOUS | Status: DC | PRN
  Administered 2021-09-12: 240 ug via INTRAVENOUS

## 2021-09-12 MED ORDER — FENTANYL CITRATE PF 50 MCG/ML IJ SOSY: 25.0000 ug | PREFILLED_SYRINGE | INTRAMUSCULAR | Status: DC | PRN

## 2021-09-12 MED ORDER — BUPIVACAINE HCL 0.25 % IJ SOLN
INTRAMUSCULAR | Status: DC | PRN
  Administered 2021-09-12: 20 mL

## 2021-09-12 MED ORDER — FENTANYL CITRATE (PF) 100 MCG/2ML IJ SOLN
INTRAMUSCULAR | Status: AC
  Filled 2021-09-12: qty 2

## 2021-09-12 MED ORDER — FENTANYL CITRATE (PF) 100 MCG/2ML IJ SOLN
INTRAMUSCULAR | Status: DC | PRN
  Administered 2021-09-12 (×2): 25 ug via INTRAVENOUS

## 2021-09-12 MED ORDER — PROPOFOL 10 MG/ML IV BOLUS
INTRAVENOUS | Status: DC | PRN
  Administered 2021-09-12: 80 mg via INTRAVENOUS

## 2021-09-12 MED ORDER — CHLORHEXIDINE GLUCONATE CLOTH 2 % EX PADS: 6.0000 | MEDICATED_PAD | Freq: Once | CUTANEOUS | Status: DC

## 2021-09-12 MED ORDER — CHLORHEXIDINE GLUCONATE 0.12 % MT SOLN
15.0000 mL | Freq: Once | OROMUCOSAL | Status: AC
  Administered 2021-09-12: 15 mL via OROMUCOSAL

## 2021-09-12 MED ORDER — 0.9 % SODIUM CHLORIDE (POUR BTL) OPTIME
TOPICAL | Status: DC | PRN
  Administered 2021-09-12: 1000 mL

## 2021-09-12 MED ORDER — BUPIVACAINE HCL 0.25 % IJ SOLN
INTRAMUSCULAR | Status: AC
  Filled 2021-09-12: qty 1

## 2021-09-12 MED ORDER — LIDOCAINE HCL (PF) 2 % IJ SOLN
INTRAMUSCULAR | Status: AC
  Filled 2021-09-12: qty 5

## 2021-09-12 MED ORDER — BUPIVACAINE LIPOSOME 1.3 % IJ SUSP: 20.0000 mL | Freq: Once | INTRAMUSCULAR | Status: DC

## 2021-09-12 MED ORDER — OXYCODONE HCL 5 MG/5ML PO SOLN: 5.0000 mg | Freq: Once | ORAL | Status: DC | PRN

## 2021-09-12 MED ORDER — SUGAMMADEX SODIUM 500 MG/5ML IV SOLN
INTRAVENOUS | Status: AC
  Filled 2021-09-12: qty 5

## 2021-09-12 MED ORDER — ONDANSETRON HCL 4 MG/2ML IJ SOLN
INTRAMUSCULAR | Status: DC | PRN
  Administered 2021-09-12: 4 mg via INTRAVENOUS

## 2021-09-12 MED ORDER — PROPOFOL 10 MG/ML IV BOLUS
INTRAVENOUS | Status: AC
  Filled 2021-09-12: qty 20

## 2021-09-12 MED ORDER — DEXAMETHASONE SODIUM PHOSPHATE 10 MG/ML IJ SOLN
INTRAMUSCULAR | Status: AC
  Filled 2021-09-12: qty 1

## 2021-09-12 MED ORDER — TRAMADOL HCL 50 MG PO TABS: 50.0000 mg | ORAL_TABLET | Freq: Four times a day (QID) | ORAL | 0 refills | Status: DC | PRN

## 2021-09-12 MED ORDER — ONDANSETRON HCL 4 MG/2ML IJ SOLN: 4.0000 mg | Freq: Once | INTRAMUSCULAR | Status: DC | PRN

## 2021-09-12 MED ORDER — CEFAZOLIN SODIUM-DEXTROSE 2-4 GM/100ML-% IV SOLN
2.0000 g | INTRAVENOUS | Status: AC
  Administered 2021-09-12: 2 g via INTRAVENOUS
  Filled 2021-09-12: qty 100

## 2021-09-12 MED ORDER — LIDOCAINE 2% (20 MG/ML) 5 ML SYRINGE
INTRAMUSCULAR | Status: DC | PRN
Start: 2021-09-12 — End: 2021-09-12
  Administered 2021-09-12: 60 mg via INTRAVENOUS

## 2021-09-12 MED ORDER — BUPIVACAINE LIPOSOME 1.3 % IJ SUSP
INTRAMUSCULAR | Status: DC | PRN
  Administered 2021-09-12: 20 mL

## 2021-09-12 MED ORDER — ROCURONIUM BROMIDE 10 MG/ML (PF) SYRINGE
PREFILLED_SYRINGE | INTRAVENOUS | Status: AC
  Filled 2021-09-12: qty 10

## 2021-09-12 MED ORDER — SUGAMMADEX SODIUM 500 MG/5ML IV SOLN
INTRAVENOUS | Status: DC | PRN
Start: 2021-09-12 — End: 2021-09-12
  Administered 2021-09-12: 400 mg via INTRAVENOUS

## 2021-09-12 MED ORDER — KETOROLAC TROMETHAMINE 30 MG/ML IJ SOLN: 15.0000 mg | Freq: Once | INTRAMUSCULAR | Status: DC | PRN

## 2021-09-12 MED ORDER — LACTATED RINGERS IV SOLN: INTRAVENOUS | Status: DC

## 2021-09-12 SURGICAL SUPPLY — 39 items
BAG COUNTER SPONGE SURGICOUNT (BAG) ×2 IMPLANT
BLADE SURG 15 STRL LF DISP TIS (BLADE) ×1 IMPLANT
BLADE SURG 15 STRL SS (BLADE) ×2
CHLORAPREP W/TINT 26 (MISCELLANEOUS) ×4 IMPLANT
COVER SURGICAL LIGHT HANDLE (MISCELLANEOUS) ×2 IMPLANT
DERMABOND ADVANCED (GAUZE/BANDAGES/DRESSINGS) ×1
DERMABOND ADVANCED .7 DNX12 (GAUZE/BANDAGES/DRESSINGS) IMPLANT
DRAIN PENROSE 0.5X18 (DRAIN) ×2 IMPLANT
DRAPE LAPAROTOMY TRNSV 102X78 (DRAPES) ×2 IMPLANT
DRAPE UTILITY XL STRL (DRAPES) ×2 IMPLANT
ELECT REM PT RETURN 15FT ADLT (MISCELLANEOUS) ×2 IMPLANT
GLOVE BIOGEL PI IND STRL 7.0 (GLOVE) ×1 IMPLANT
GLOVE BIOGEL PI INDICATOR 7.0 (GLOVE) ×1
GLOVE SURG ORTHO 8.0 STRL STRW (GLOVE) ×2 IMPLANT
GLOVE SURG SYN 7.5  E (GLOVE) ×2
GLOVE SURG SYN 7.5 E (GLOVE) ×1 IMPLANT
GLOVE SURG SYN 7.5 PF PI (GLOVE) ×1 IMPLANT
GOWN STRL REUS W/ TWL XL LVL3 (GOWN DISPOSABLE) ×2 IMPLANT
GOWN STRL REUS W/TWL XL LVL3 (GOWN DISPOSABLE) ×4
KIT BASIN OR (CUSTOM PROCEDURE TRAY) ×2 IMPLANT
KIT TURNOVER KIT A (KITS) IMPLANT
MARKER SKIN DUAL TIP RULER LAB (MISCELLANEOUS) ×2 IMPLANT
MESH ULTRAPRO 3X6 7.6X15CM (Mesh General) ×1 IMPLANT
NDL HYPO 25X1 1.5 SAFETY (NEEDLE) ×1 IMPLANT
NEEDLE HYPO 25X1 1.5 SAFETY (NEEDLE) ×2 IMPLANT
NS IRRIG 1000ML POUR BTL (IV SOLUTION) ×2 IMPLANT
PACK BASIC VI WITH GOWN DISP (CUSTOM PROCEDURE TRAY) ×2 IMPLANT
PENCIL SMOKE EVACUATOR (MISCELLANEOUS) IMPLANT
SPIKE FLUID TRANSFER (MISCELLANEOUS) ×2 IMPLANT
SPONGE T-LAP 4X18 ~~LOC~~+RFID (SPONGE) ×6 IMPLANT
STRIP CLOSURE SKIN 1/2X4 (GAUZE/BANDAGES/DRESSINGS) ×2 IMPLANT
SUT MNCRL AB 4-0 PS2 18 (SUTURE) ×2 IMPLANT
SUT NOVA NAB GS-21 0 18 T12 DT (SUTURE) IMPLANT
SUT NOVA NAB GS-22 2 0 T19 (SUTURE) ×4 IMPLANT
SUT SILK 2 0 SH (SUTURE) ×2 IMPLANT
SUT VIC AB 3-0 SH 18 (SUTURE) ×2 IMPLANT
SYR BULB IRRIG 60ML STRL (SYRINGE) ×2 IMPLANT
SYR CONTROL 10ML LL (SYRINGE) ×2 IMPLANT
TOWEL OR 17X26 10 PK STRL BLUE (TOWEL DISPOSABLE) ×2 IMPLANT

## 2021-09-12 NOTE — Interval H&P Note (Signed)
History and Physical Interval Note:  09/12/2021 11:03 AM  Tom Norton  has presented today for surgery, with the diagnosis of RIGHT INGUINAL HERNIA, REDUCIBLE.  The various methods of treatment have been discussed with the patient and family. After consideration of risks, benefits and other options for treatment, the patient has consented to    Procedure(s): OPEN REPAIR RIGHT INGUINAL HERNIA WITH MESH (Right) as a surgical intervention.    The patient's history has been reviewed, patient examined, no change in status, stable for surgery.  I have reviewed the patient's chart and labs.  Questions were answered to the patient's satisfaction.    Armandina Gemma, [facility] [place] practice Office: [place]

## 2021-09-12 NOTE — Discharge Instructions (Signed)
[facility]  HERNIA REPAIR POST OP INSTRUCTIONS  Always review your discharge instruction sheet given to you by the facility where your surgery was performed.  A  prescription for pain medication may be sent to your pharmacy on discharge.  Take your pain medication as prescribed.  If narcotic pain medicine is not needed, then you may take acetaminophen (Tylenol) or ibuprofen (Advil) as needed.  Take your usually prescribed medications unless otherwise directed.  If you need a refill on your pain medication, please contact your pharmacy.  They will contact our office to request authorization. Prescriptions will not be filled after 5:00 PM daily or on weekends.  You should follow a light diet the first 24 hours after arrival home, such as soup and crackers or toast.  Be sure to include plenty of fluids daily.  Resume your normal diet the day after surgery.  Most patients will experience some swelling and bruising around the surgical site.  Ice packs and reclining will help.  Swelling and bruising can take several days to resolve.   It is common to experience some constipation if taking pain medication after surgery.  Increasing fluid intake and taking a stool softener (such as Colace) will usually help or prevent this problem from occurring.  A mild laxative (Milk of Magnesia or Miralax) should be taken according to package directions if there is no bowel movement after 48 hours.  You will likely have Dermabond (topical glue) over your incisions.  This seals the incisions and allows you to bathe and shower at any time after your surgery.  Glue should remain in place for up to 10 days.  It may be removed after 10 days by pealing off the Dermabond material or using Vaseline or naval jelly to remove.  ACTIVITIES:  You may resume regular (light) daily activities beginning the next day - such as daily self-care, walking, climbing stairs - gradually increasing activities as tolerated.  You  may have sexual intercourse when it is comfortable.  Refrain from any heavy lifting or straining until approved by your doctor.  You may drive when you are no longer taking prescription pain medication, when you can comfortably wear a seatbelt, and when you can safely maneuver your car and apply the brakes.  You should see your doctor in the office for a follow-up appointment approximately 2-3 weeks after your surgery.  Make sure that you call for this appointment within a day or two after you arrive home to insure a convenient appointment time.  WHEN TO CALL YOUR DOCTOR: Fever greater than 101.0 Inability to urinate Persistent nausea and/or vomiting Extreme swelling or bruising Continued bleeding from incision Increased pain, redness, or drainage from the incision  The clinic staff is available to answer your questions during regular business hours.  Please don't hesitate to call and ask to speak to one of the nurses for clinical concerns.  If you have a medical emergency, go to the nearest emergency room or call 911.  A surgeon from [facility] is always on call for the hospital.   [facility] [address], [address], [place], [place]  [address]  [phone] ? [phone] ? FAX [phone] 

## 2021-09-12 NOTE — Op Note (Signed)
Procedure Note  Pre-operative Diagnosis:  right inguinal hernia, reducible  Post-operative Diagnosis: same  Procedure:  Open right inguinal hernia repair with mesh  Surgeon:  Armandina Gemma, MD  Anesthesia:  General  Preparation:  Chlora-prep  Estimated Blood Loss: minimal  Complications:  none  Indications: The patient presented with a right, reducible hernia.    Patient is referred by Dr. Marcellus Scott for surgical evaluation and management of right inguinal hernia. Patient is known to my practice having undergone urgent repair of a recurrent left inguinal hernia with associated bowel obstruction in January 2021. Patient did well with that procedure and has had no signs or symptoms of recurrence on the left side. Over the past several months the patient has noted a gradual bulge in the right groin. This causes some discomfort. It is always reducible when he lies recumbent at night. He has had some intermittent constipation and takes a laxative about once a week. Patient has had no prior hernia repairs on the right side. Patient was evaluated by his primary care physician and referred to general surgery for repair.  Procedure Details  The patient was evaluated in the holding area. All of the patient's questions were answered and the proposed procedure was confirmed. The site of the procedure was properly marked. The patient was taken to the Operating Room, identified by name, and the procedure verified as inguinal hernia repair.  The patient was placed in the supine position and underwent induction of anesthesia. A "Time Out" was performed per routine. The lower abdomen and groin were prepped and draped in the usual aseptic fashion.  After ascertaining that an adequate level of anesthesia had been obtained, an incision was made in the groin with a #10 blade.  Dissection was carried through the subcutaneous tissues and hemostasis obtained with the electrocautery.  A Gelpi retractor was placed  for exposure.  The external oblique fascia was incised in line with it's fibers and extended through the external inguinal ring.  The cord structures were dissected out of the inguinal canal and encircled with a Penrose drain.  The floor of the inguinal canal was dissected out.  There was no direct defect present.  The cord was explored and a moderate sized sac was dissected away from the cord structures.  A high ligation was performed with a 2-0 silk suture ligature and the sac excised and discarded.  The lateral aspect of the internal ring was closed with interrupted 0-Novofil sutures.  The floor of the inguinal canal was reconstructed with Ethicon Ultrapro mesh cut to the appropriate dimensions.  It was secured to the pubic tubercle with a 2-0 Novafil suture and along the inguinal ligament with a running 2-0 Novafil suture.  Mesh was split to accommodate the cord structures.  The superior margin of the mesh was secured to the transversalis and internal oblique musculature with interrupted 2-0 Novafil sutures.  The tails of the mesh were overlapped lateral to the cord structures and secured to the inguinal ligament with interrupted 2-0 Novafil sutures to recreate the internal inguinal ring.  Cord structures were returned to the inguinal canal.  Local anesthetic with Exparel was infiltrated throughout the field.  External oblique fascia was closed with interrupted 3-0 Vicryl sutures.  Subcutaneous tissues were closed with interrupted 3-0 Vicryl sutures.  Skin was anesthetized with local anesthetic with Exparel, and the skin edges were re-approximated with a running 4-0 Monocryl suture.  Wound was washed and dried and Dermabond was applied.  Instrument, sponge, and  needle counts were correct prior to closure and at the conclusion of the case.  The patient tolerated the procedure well.  The patient was awakened from anesthesia and brought to the recovery room in stable condition.  Armandina Gemma, MD [facility] Office: [phone]

## 2021-09-12 NOTE — Transfer of Care (Signed)
Immediate Anesthesia Transfer of Care Note  Patient: Tom Norton  Procedure(s) Performed: Procedure(s): OPEN REPAIR RIGHT INGUINAL HERNIA WITH MESH (Right)  Patient Location: PACU  Anesthesia Type:General  Level of Consciousness: Patient easily awoken, sedated, comfortable, cooperative, following commands, responds to stimulation.   Airway & Oxygen Therapy: Patient spontaneously breathing, ventilating well, oxygen via simple oxygen mask.  Post-op Assessment: Report given to PACU RN, vital signs reviewed and stable, moving all extremities.   Post vital signs: Reviewed and stable.  Complications: No apparent anesthesia complications Last Vitals:  Vitals Value Taken Time  BP 150/90 09/12/21 1254  Temp    Pulse 66 09/12/21 1256  Resp 17 09/12/21 1256  SpO2 100 % 09/12/21 1256  Vitals shown include unvalidated device data.  Last Pain:  Vitals:   09/12/21 0950  TempSrc:   PainSc: 0-No pain         Complications: No notable events documented.

## 2021-09-12 NOTE — Progress Notes (Signed)
82 Dr. Kalman Shan in to see Mr. Vanleeuwen pre op ordered Tylenol 1GM given pre med-given at 1049.

## 2021-09-12 NOTE — Anesthesia Procedure Notes (Signed)
Procedure Name: Intubation Date/Time: 09/12/2021 11:38 AM Performed by: Deliah Boston, CRNA Pre-anesthesia Checklist: Patient identified, Emergency Drugs available, Suction available and Patient being monitored Patient Re-evaluated:Patient Re-evaluated prior to induction Oxygen Delivery Method: Circle system utilized Preoxygenation: Pre-oxygenation with 100% oxygen Induction Type: IV induction Ventilation: Mask ventilation without difficulty Laryngoscope Size: Mac and 4 Grade View: Grade I Tube type: Oral Tube size: 7.5 mm Number of attempts: 1 Airway Equipment and Method: Stylet and Oral airway Placement Confirmation: ETT inserted through vocal cords under direct vision, positive ETCO2 and breath sounds checked- equal and bilateral Secured at: 21 cm Tube secured with: Tape Dental Injury: Teeth and Oropharynx as per pre-operative assessment

## 2021-09-13 ENCOUNTER — Encounter (HOSPITAL_COMMUNITY): Payer: Self-pay | Admitting: Surgery

## 2021-09-13 NOTE — Anesthesia Postprocedure Evaluation (Signed)
Anesthesia Post Note  Patient: Tom Norton  Procedure(s) Performed: OPEN REPAIR RIGHT INGUINAL HERNIA WITH MESH (Right)     Patient location during evaluation: PACU Anesthesia Type: General Level of consciousness: awake and alert Pain management: pain level controlled Vital Signs Assessment: post-procedure vital signs reviewed and stable Respiratory status: spontaneous breathing, nonlabored ventilation, respiratory function stable and patient connected to nasal cannula oxygen Cardiovascular status: blood pressure returned to baseline and stable Postop Assessment: no apparent nausea or vomiting Anesthetic complications: no   No notable events documented.  Last Vitals:  Vitals:   09/12/21 1330 09/12/21 1338  BP: (!) 141/88 (!) 138/93  Pulse: 63 63  Resp: 15   Temp: (!) 36.4 C   SpO2: 94% 92%    Last Pain:  Vitals:   09/12/21 1338  TempSrc:   PainSc: 0-No pain   Pain Goal:                   Joel Cowin L Deetra Booton

## 2021-10-05 IMAGING — DX DG ABD PORTABLE 1V
2 series · 2 of 2 positions shown · non-contrast
Comparison: CT abdomen and pelvis May 02, 2019 obtained earlier
in the day

CLINICAL DATA: Progressive abdominal pain with nausea and vomiting

EXAM:
PORTABLE ABDOMEN - 1 VIEW

[abdomen kub (1 of 2)]
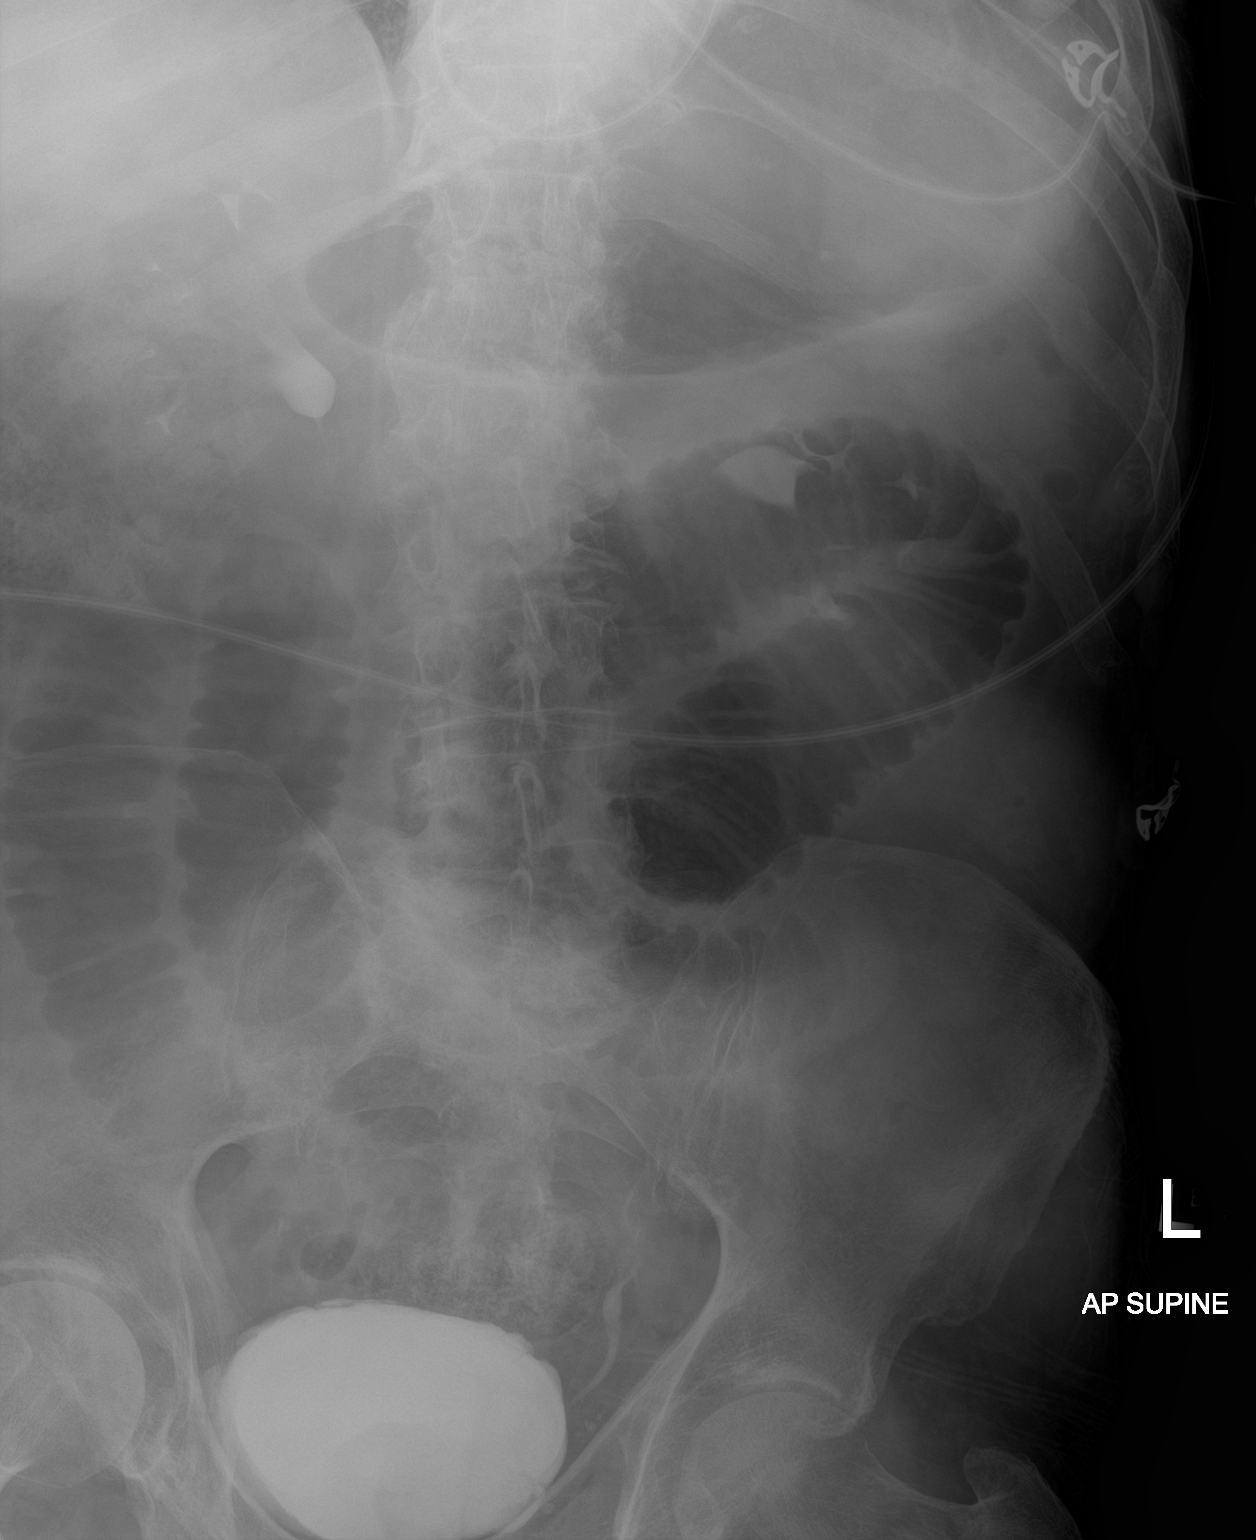

[abdomen kub (2 of 2)]
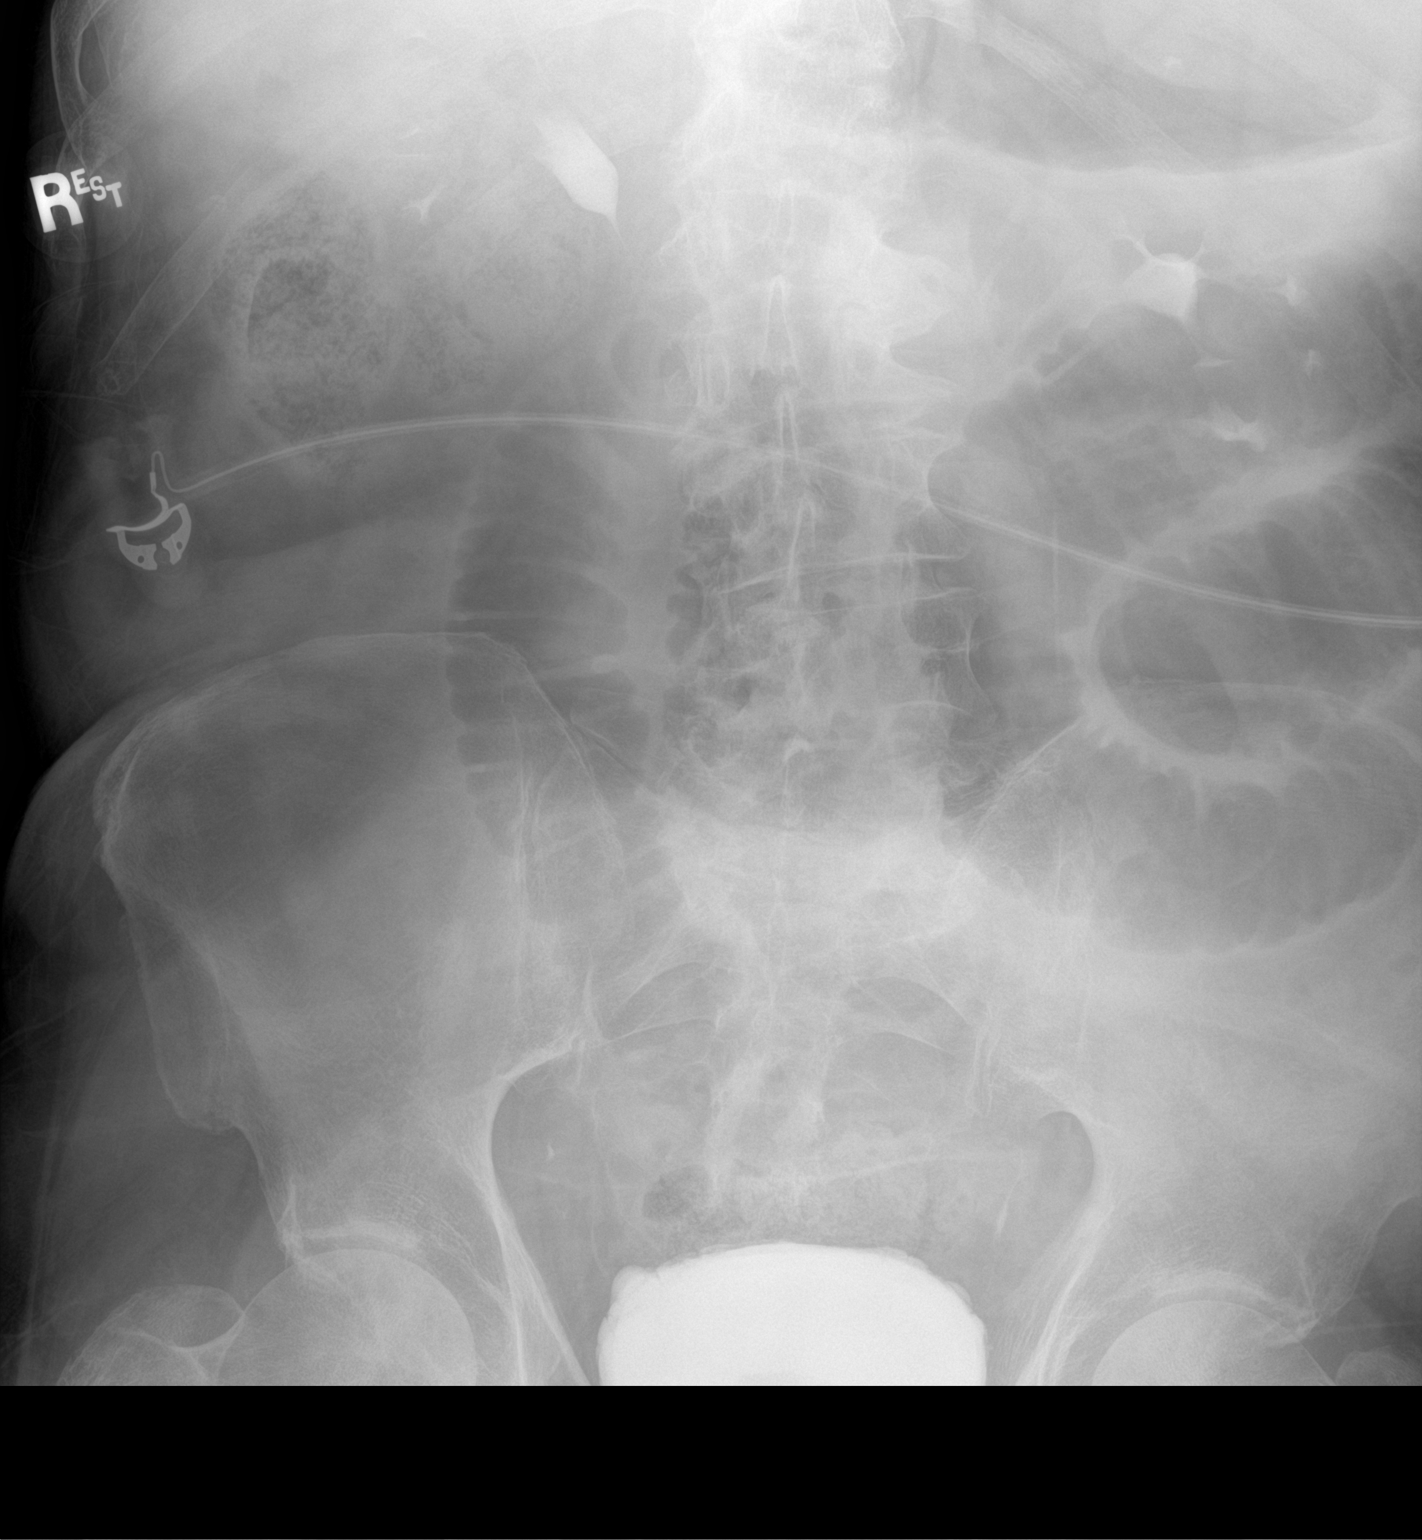

[2 of 2 positions shown; findings below may reference images not displayed]

FINDINGS: Multiple loops of bowel remain dilated with borderline thickening of
multiple areas of bowel wall consistent with the bowel obstruction
noted on recent CT. No free air evident. Contrast is seen in the
collecting systems, ureters, and urinary bladder.
IMPRESSION: Persistent bowel dilatation with borderline bowel wall thickening at
multiple sites. Findings indicative of bowel obstruction. No free
air appreciable on supine examination.

## 2021-10-05 IMAGING — CT CT ABD-PELV W/ CM
2 of 5 series · 17 of 46 positions shown, 19 images · IV contrast (omnipaque)
Comparison: None.

CLINICAL DATA: Bowel obstruction suspected. Left lower quadrant
pain for 2 weeks

EXAM:
CT ABDOMEN AND PELVIS WITH CONTRAST
TECHNIQUE: Multidetector CT imaging of the abdomen and pelvis was performed
using the standard protocol following bolus administration of
intravenous contrast.
CONTRAST:  100mL OMNIPAQUE IOHEXOL 300 MG/ML  SOLN

[Series 2: axial st · axial · 0.83mm/px · z∈[-442,-47]mm · 14 of 91 slices shown, 16 images]
[im 6/91  soft-tissue]
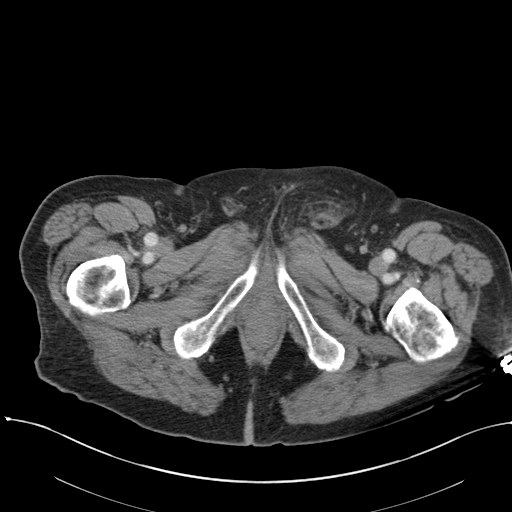
[im 6/91  bone]
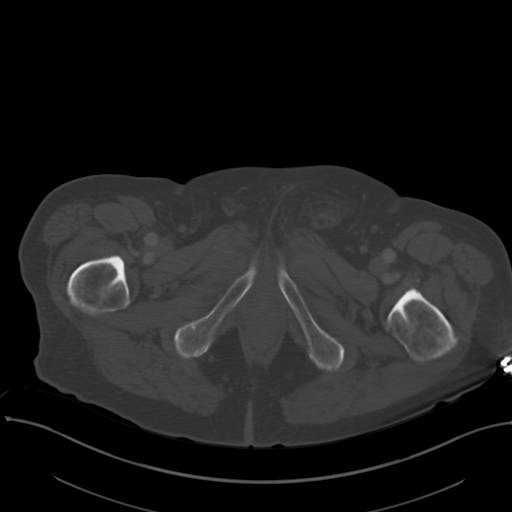
[im 12/91  soft-tissue]
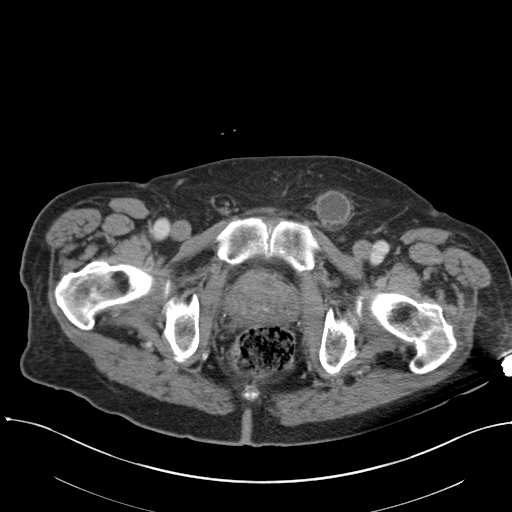
[im 17/91  soft-tissue]
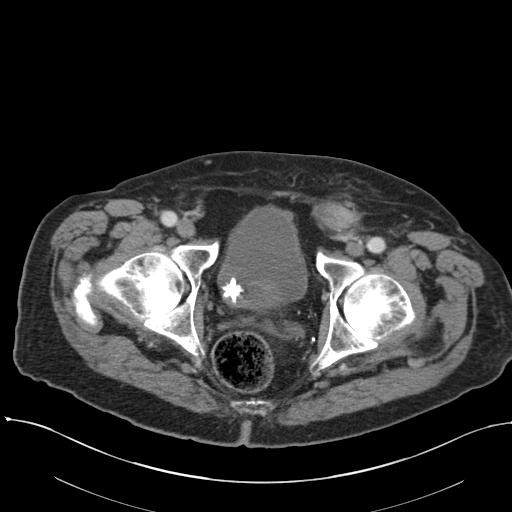
[im 23/91  soft-tissue]
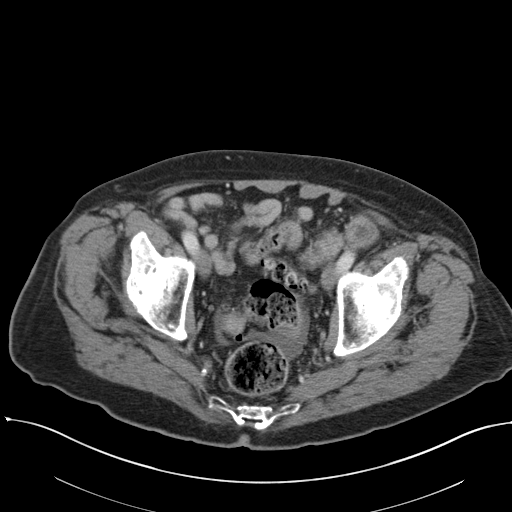
[im 29/91  soft-tissue]
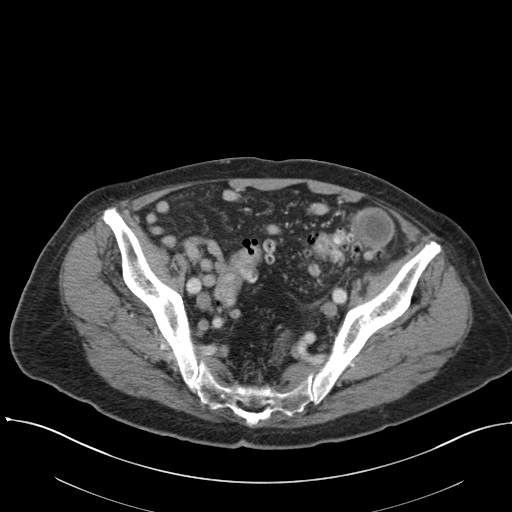
[im 34/91  soft-tissue]
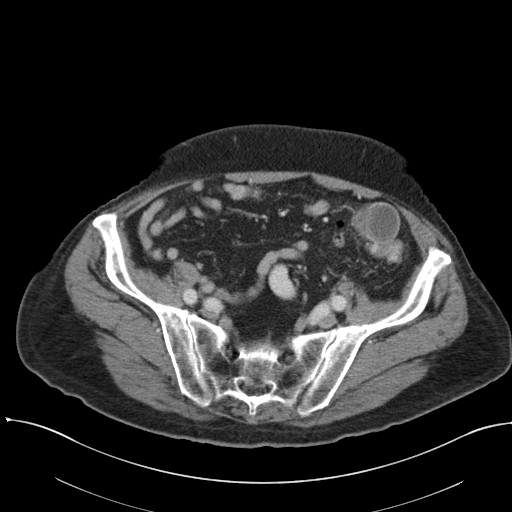
[im 40/91  soft-tissue]
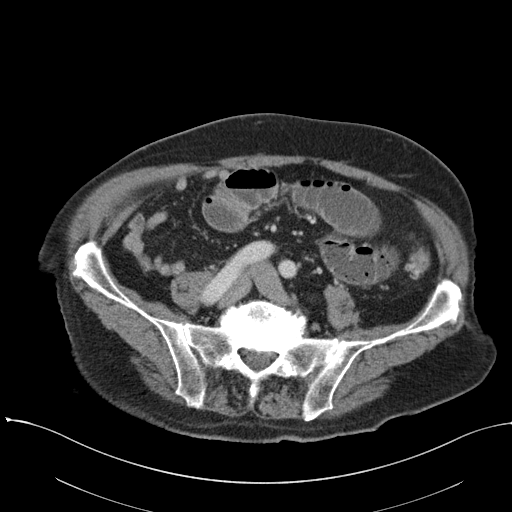
[im 51/91  soft-tissue]
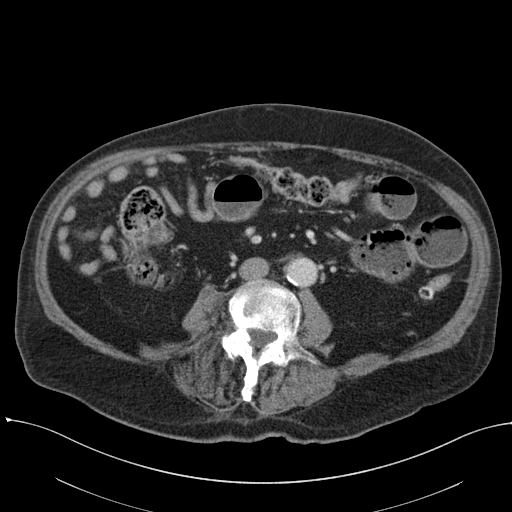
[im 57/91  soft-tissue]
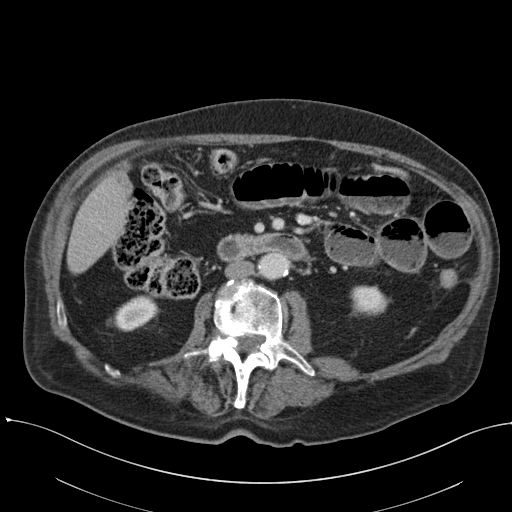
[im 57/91  bone]
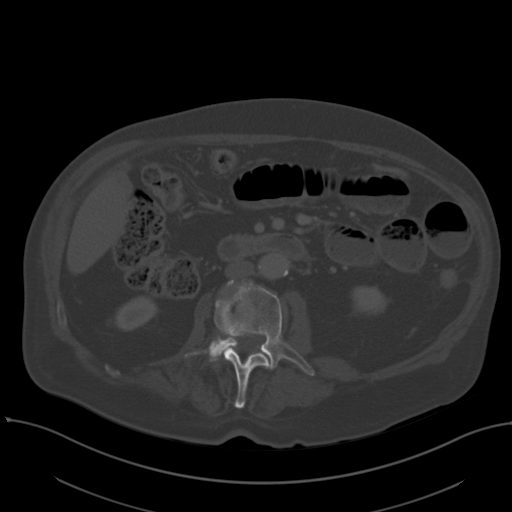
[im 62/91  soft-tissue]
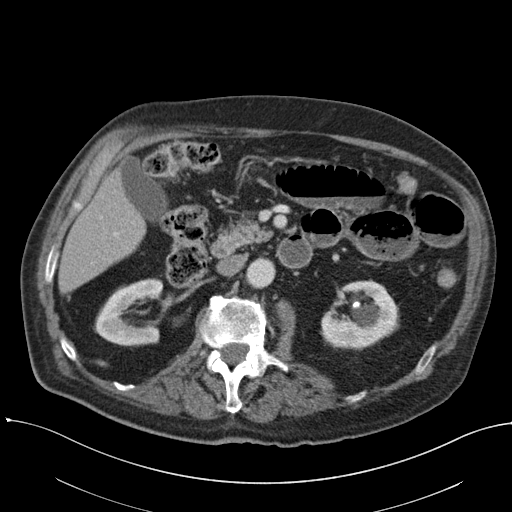
[im 68/91  soft-tissue]
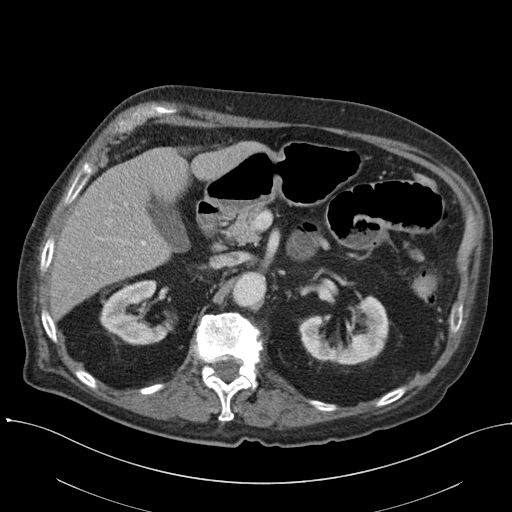
[im 74/91  soft-tissue]
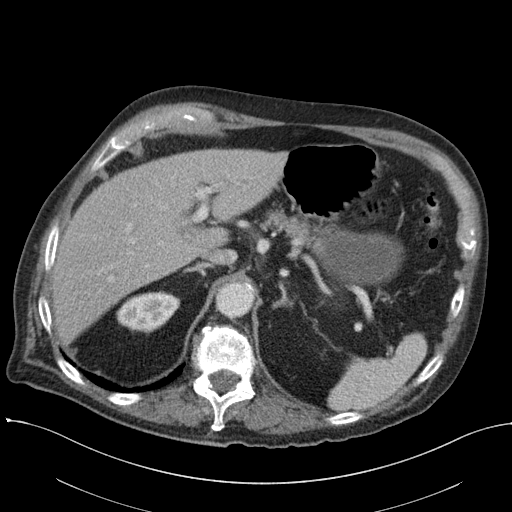
[im 79/91  soft-tissue]
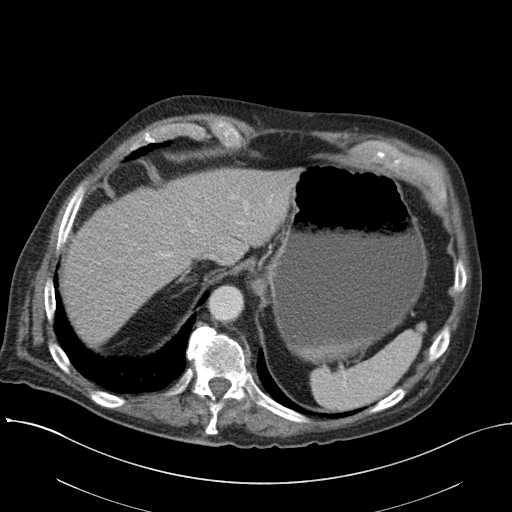
[im 85/91  soft-tissue]
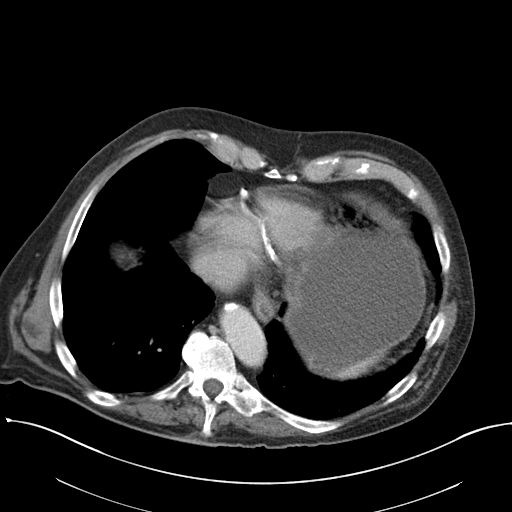

[Series 4: coronal st · coronal · 0.79mm/px · 3 of 135 slices shown]
[im 45/135  soft-tissue]
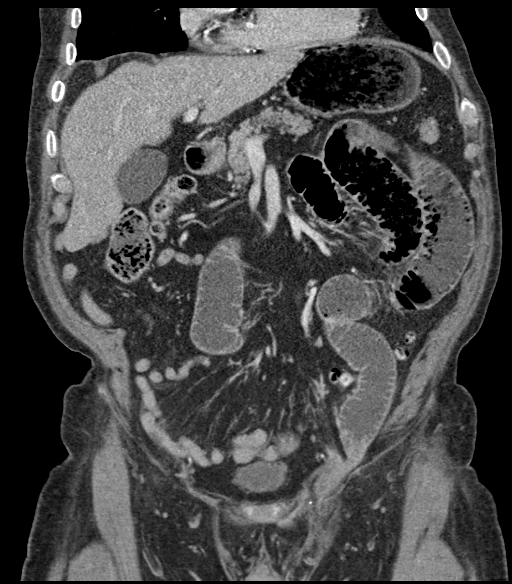
[im 60/135  soft-tissue]
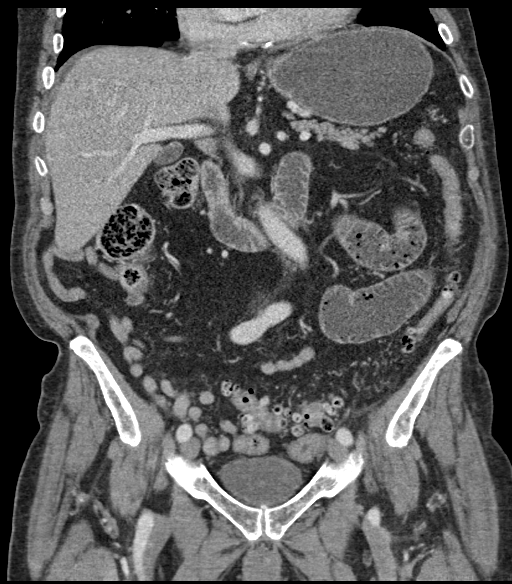
[im 75/135  soft-tissue]
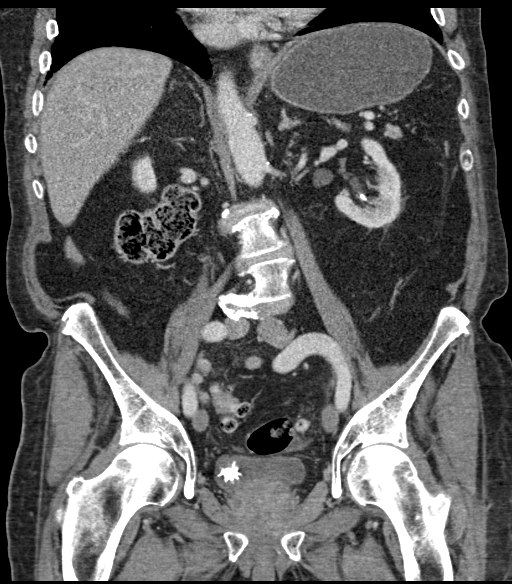

[17 of 46 positions shown; findings below may reference images not displayed]

FINDINGS: Lower chest:  Coronary atherosclerosis.  Lung bases are clear.

Hepatobiliary: No focal liver abnormality.No evidence of biliary
obstruction or stone.

Pancreas: Unremarkable.

Spleen: Unremarkable.

Adrenals/Urinary Tract: Negative adrenals. No hydronephrosis or
ureteral stone. There are 2 left renal calculi measuring up to 1 cm
at the lower pole. Left renal cortical and hilar cysts. [place]
calculus in the bladder measuring 2 cm. Mild bladder wall thickening
and lobulation likely from small cellules.

Stomach/Bowel: Dilated bowel above a bowel containing left inguinal
hernia. No evident perforation or bowel necrosis. There is extensive
left colonic diverticulosis.

Vascular/Lymphatic: No acute vascular abnormality. Atherosclerotic
calcification. No mass or adenopathy.

Reproductive:Enlarged prostate.

Other: Left inguinal hernia as noted above. Small fatty umbilical
hernia. Trace pelvic fluid considered reactive.

Musculoskeletal: Advanced spinal degeneration with lumbar
levoscoliosis. No acute or aggressive finding
IMPRESSION: 1. High-grade small bowel obstruction due to left inguinal hernia.
2. Left renal and bladder calculi.

## 2021-10-10 DIAGNOSIS — F068 Other specified mental disorders due to known physiological condition: Secondary | ICD-10-CM | POA: Diagnosis not present

## 2021-10-10 DIAGNOSIS — I10 Essential (primary) hypertension: Secondary | ICD-10-CM | POA: Diagnosis not present

## 2021-10-10 DIAGNOSIS — I480 Paroxysmal atrial fibrillation: Secondary | ICD-10-CM | POA: Diagnosis not present

## 2021-11-09 DIAGNOSIS — I10 Essential (primary) hypertension: Secondary | ICD-10-CM | POA: Diagnosis not present

## 2021-11-09 DIAGNOSIS — I480 Paroxysmal atrial fibrillation: Secondary | ICD-10-CM | POA: Diagnosis not present

## 2021-11-09 DIAGNOSIS — E039 Hypothyroidism, unspecified: Secondary | ICD-10-CM | POA: Diagnosis not present

## 2021-11-09 DIAGNOSIS — N401 Enlarged prostate with lower urinary tract symptoms: Secondary | ICD-10-CM | POA: Diagnosis not present

## 2021-11-09 DIAGNOSIS — E782 Mixed hyperlipidemia: Secondary | ICD-10-CM | POA: Diagnosis not present

## 2021-11-09 DIAGNOSIS — M170 Bilateral primary osteoarthritis of knee: Secondary | ICD-10-CM | POA: Diagnosis not present

## 2021-12-12 DIAGNOSIS — D3617 Benign neoplasm of peripheral nerves and autonomic nervous system of trunk, unspecified: Secondary | ICD-10-CM | POA: Diagnosis not present

## 2021-12-12 DIAGNOSIS — D1801 Hemangioma of skin and subcutaneous tissue: Secondary | ICD-10-CM | POA: Diagnosis not present

## 2021-12-12 DIAGNOSIS — Z85828 Personal history of other malignant neoplasm of skin: Secondary | ICD-10-CM | POA: Diagnosis not present

## 2021-12-12 DIAGNOSIS — L821 Other seborrheic keratosis: Secondary | ICD-10-CM | POA: Diagnosis not present

## 2021-12-12 DIAGNOSIS — L570 Actinic keratosis: Secondary | ICD-10-CM | POA: Diagnosis not present

## 2022-01-02 DIAGNOSIS — N401 Enlarged prostate with lower urinary tract symptoms: Secondary | ICD-10-CM | POA: Diagnosis not present

## 2022-01-02 DIAGNOSIS — Z23 Encounter for immunization: Secondary | ICD-10-CM | POA: Diagnosis not present

## 2022-01-02 DIAGNOSIS — Z5181 Encounter for therapeutic drug level monitoring: Secondary | ICD-10-CM | POA: Diagnosis not present

## 2022-01-02 DIAGNOSIS — I480 Paroxysmal atrial fibrillation: Secondary | ICD-10-CM | POA: Diagnosis not present

## 2022-01-02 DIAGNOSIS — I10 Essential (primary) hypertension: Secondary | ICD-10-CM | POA: Diagnosis not present

## 2022-01-02 DIAGNOSIS — E039 Hypothyroidism, unspecified: Secondary | ICD-10-CM | POA: Diagnosis not present

## 2022-01-02 DIAGNOSIS — Z0000 Encounter for general adult medical examination without abnormal findings: Secondary | ICD-10-CM | POA: Diagnosis not present

## 2022-01-02 DIAGNOSIS — E782 Mixed hyperlipidemia: Secondary | ICD-10-CM | POA: Diagnosis not present

## 2022-01-02 DIAGNOSIS — Z1331 Encounter for screening for depression: Secondary | ICD-10-CM | POA: Diagnosis not present

## 2022-01-02 DIAGNOSIS — G4700 Insomnia, unspecified: Secondary | ICD-10-CM | POA: Diagnosis not present

## 2022-01-02 DIAGNOSIS — Z8639 Personal history of other endocrine, nutritional and metabolic disease: Secondary | ICD-10-CM | POA: Diagnosis not present

## 2022-01-02 DIAGNOSIS — R2689 Other abnormalities of gait and mobility: Secondary | ICD-10-CM | POA: Diagnosis not present

## 2022-01-02 DIAGNOSIS — Z91030 Bee allergy status: Secondary | ICD-10-CM | POA: Diagnosis not present

## 2022-02-06 ENCOUNTER — Other Ambulatory Visit: Payer: Self-pay | Admitting: Cardiovascular Disease

## 2022-02-06 NOTE — Telephone Encounter (Signed)
Prescription refill request for Eliquis received. Indication: PAF Last office visit: 03/25/21  Jerilynn Mages Croitoru MD Scr: 1.25 on 08/30/21 Age: 88 Weight: 93.1kg  Based on above findings Eliquis '5mg'$  twice daily is the appropriate dose.  Refill approved.

## 2022-05-31 ENCOUNTER — Ambulatory Visit: Payer: Medicare Other | Attending: Cardiovascular Disease | Admitting: Cardiovascular Disease

## 2022-05-31 ENCOUNTER — Encounter: Payer: Self-pay | Admitting: Cardiovascular Disease

## 2022-05-31 VITALS — BP 128/90 | HR 80 | Ht 72.0 in | Wt 207.2 lb

## 2022-05-31 DIAGNOSIS — D6869 Other thrombophilia: Secondary | ICD-10-CM | POA: Diagnosis not present

## 2022-05-31 DIAGNOSIS — I480 Paroxysmal atrial fibrillation: Secondary | ICD-10-CM | POA: Diagnosis not present

## 2022-05-31 DIAGNOSIS — I77810 Thoracic aortic ectasia: Secondary | ICD-10-CM | POA: Diagnosis not present
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
## 2022-05-31 NOTE — Patient Instructions (Signed)
Medication Instructions:  Your physician recommends that you continue on your current medications as directed. Please refer to the Current Medication list given to you today.  *If you need a refill on your cardiac medications before your next appointment, please call your pharmacy*   Lab Work: NONE If you have labs (blood work) drawn today and your tests are completely normal, you will receive your results only by: [place] (if you have MyChart) OR A paper copy in the mail If you have any lab test that is abnormal or we need to change your treatment, we will call you to review the results.   Testing/Procedures: NONE   Follow-Up: At [facility], you and your health needs are our priority.  As part of our continuing mission to provide you with exceptional heart care, we have created designated Provider Care Teams.  These Care Teams include your primary Cardiologist (physician) and Advanced Practice Providers (APPs -  Physician Assistants and Nurse Practitioners) who all work together to provide you with the care you need, when you need it.  We recommend signing up for the patient portal called "MyChart".  Sign up information is provided on this After Visit Summary.  MyChart is used to connect with patients for Virtual Visits (Telemedicine).  Patients are able to view lab/test results, encounter notes, upcoming appointments, etc.  Non-urgent messages can be sent to your provider as well.   To learn more about what you can do with MyChart, go to [email or web address].    Your next appointment:   1 year(s)  Provider:   Sanda Klein, MD

## 2022-07-03 DIAGNOSIS — E782 Mixed hyperlipidemia: Secondary | ICD-10-CM | POA: Diagnosis not present

## 2022-07-03 DIAGNOSIS — I10 Essential (primary) hypertension: Secondary | ICD-10-CM | POA: Diagnosis not present

## 2022-07-03 DIAGNOSIS — N401 Enlarged prostate with lower urinary tract symptoms: Secondary | ICD-10-CM | POA: Diagnosis not present

## 2022-07-03 DIAGNOSIS — E039 Hypothyroidism, unspecified: Secondary | ICD-10-CM | POA: Diagnosis not present

## 2022-07-03 DIAGNOSIS — D7589 Other specified diseases of blood and blood-forming organs: Secondary | ICD-10-CM | POA: Diagnosis not present

## 2022-07-03 DIAGNOSIS — R2689 Other abnormalities of gait and mobility: Secondary | ICD-10-CM | POA: Diagnosis not present

## 2022-07-03 DIAGNOSIS — R5383 Other fatigue: Secondary | ICD-10-CM | POA: Diagnosis not present

## 2022-07-03 DIAGNOSIS — Z8639 Personal history of other endocrine, nutritional and metabolic disease: Secondary | ICD-10-CM | POA: Diagnosis not present

## 2022-07-03 DIAGNOSIS — I480 Paroxysmal atrial fibrillation: Secondary | ICD-10-CM | POA: Diagnosis not present

## 2022-08-09 ENCOUNTER — Emergency Department (HOSPITAL_BASED_OUTPATIENT_CLINIC_OR_DEPARTMENT_OTHER): Payer: Medicare Other

## 2022-08-09 ENCOUNTER — Other Ambulatory Visit: Payer: Self-pay

## 2022-08-09 ENCOUNTER — Emergency Department (HOSPITAL_BASED_OUTPATIENT_CLINIC_OR_DEPARTMENT_OTHER)
Admission: EM | Admit: 2022-08-09 | Discharge: 2022-08-09 | Disposition: A | Discharge status: Home / Self Care | Payer: Medicare Other | Attending: Emergency Medicine | Admitting: Emergency Medicine

## 2022-08-09 ENCOUNTER — Encounter (HOSPITAL_BASED_OUTPATIENT_CLINIC_OR_DEPARTMENT_OTHER): Payer: Self-pay | Admitting: Emergency Medicine

## 2022-08-09 DIAGNOSIS — S0990XA Unspecified injury of head, initial encounter: Secondary | ICD-10-CM | POA: Diagnosis not present

## 2022-08-09 DIAGNOSIS — W1830XA Fall on same level, unspecified, initial encounter: Secondary | ICD-10-CM | POA: Insufficient documentation

## 2022-08-09 DIAGNOSIS — I10 Essential (primary) hypertension: Secondary | ICD-10-CM | POA: Insufficient documentation

## 2022-08-09 DIAGNOSIS — E039 Hypothyroidism, unspecified: Secondary | ICD-10-CM | POA: Insufficient documentation

## 2022-08-09 DIAGNOSIS — Z7901 Long term (current) use of anticoagulants: Secondary | ICD-10-CM | POA: Diagnosis not present

## 2022-08-09 DIAGNOSIS — S00501A Unspecified superficial injury of lip, initial encounter: Secondary | ICD-10-CM | POA: Diagnosis present

## 2022-08-09 DIAGNOSIS — R0789 Other chest pain: Secondary | ICD-10-CM | POA: Insufficient documentation

## 2022-08-09 DIAGNOSIS — W19XXXA Unspecified fall, initial encounter: Secondary | ICD-10-CM

## 2022-08-09 DIAGNOSIS — Z79899 Other long term (current) drug therapy: Secondary | ICD-10-CM | POA: Insufficient documentation

## 2022-08-09 DIAGNOSIS — S00531A Contusion of lip, initial encounter: Secondary | ICD-10-CM | POA: Diagnosis not present

## 2022-08-09 DIAGNOSIS — S299XXA Unspecified injury of thorax, initial encounter: Secondary | ICD-10-CM | POA: Diagnosis not present

## 2022-08-09 DIAGNOSIS — M4312 Spondylolisthesis, cervical region: Secondary | ICD-10-CM | POA: Diagnosis not present

## 2022-08-09 NOTE — ED Provider Notes (Signed)
[place] EMERGENCY DEPARTMENT AT [facility] Provider Note   CSN: [id number] Arrival date & time: 08/09/22  1710     History  Chief Complaint  Patient presents with   Tom Norton is a 89 y.o. male.  Pt is a 89 yo male with pmhx significant for htn, paroxysmal afib (on Eliquis), hypothyroidism, CM, HLD, OA, GERD, and BPH.  Pt fell on 4/14 and hit his face.  He has been having some left chest wall pain, so he went to UC today.  They did a CXR, but told him to come to the ED for a CT of his head since he's on Xarelto.         Home Medications Prior to Admission medications   Medication Sig Start Date End Date Taking? Authorizing Provider  bisacodyl (DULCOLAX) 10 MG suppository Place 1 suppository (10 mg total) rectally daily as needed for moderate constipation. Patient not taking: Reported on 05/31/2022 02/22/21   Leroy Sea, MD  Cholecalciferol (VITAMIN D3) 2000 units TABS Take 2,000 Units by mouth daily.    [provider]  CINNAMON PO Take 1,000 mg by mouth daily.     [provider]  Coenzyme Q10 (CO Q10) 100 MG CAPS Take 100 mg by mouth daily.    [provider]  cyanocobalamin 1000 MCG tablet Take 1,000 mcg by mouth daily.    [provider]  ELIQUIS 5 MG TABS tablet TAKE 1 TABLET BY MOUTH 2 TIMES DAILY. 02/06/22   Croitoru, Mihai, MD  EPINEPHrine 0.3 mg/0.3 mL IJ SOAJ injection Inject 0.3 mg into the muscle daily as needed (allergic reaction). Patient not taking: Reported on 05/31/2022    [provider]  eszopiclone (LUNESTA) 1 MG TABS tablet Take 1 tablet (1 mg total) by mouth at bedtime. Take immediately before bedtime 02/22/21   Leroy Sea, MD  finasteride (PROSCAR) 5 MG tablet Take 5 mg by mouth daily. 08/23/21   [provider]  levothyroxine (SYNTHROID) 50 MCG tablet Take 50 mcg by mouth daily before breakfast.    [provider]  Multiple Vitamin (MULTIVITAMIN WITH MINERALS)  TABS Take 1 tablet by mouth daily.    [provider]  OVER THE COUNTER MEDICATION Take 1-2 capsules by mouth at bedtime as needed (sleep). Relaxium sleep aid Patient not taking: Reported on 05/31/2022    [provider]  polyethylene glycol (MIRALAX / GLYCOLAX) 17 g packet Take 17 g by mouth daily. Patient not taking: Reported on 05/31/2022 02/23/21   Leroy Sea, MD  tamsulosin (FLOMAX) 0.4 MG CAPS capsule Take 0.4 mg by mouth 2 (two) times daily.    [provider]      Allergies    Bee venom, Doxycycline hyclate, and Bactrim [sulfamethoxazole-trimethoprim]    Review of Systems   Review of Systems  Cardiovascular:  Positive for chest pain.  All other systems reviewed and are negative.   Physical Exam Updated Vital Signs BP (!) 166/103 (BP Location: Right Arm)   Pulse 85   Temp 98 F (36.7 C) (Oral)   Resp 18   Wt 90.7 kg   SpO2 99%   BMI 27.12 kg/m  Physical Exam Vitals and nursing note reviewed.  Constitutional:      Appearance: Normal appearance.  HENT:     Head: Normocephalic.     Comments: Bruise to upper lip.  No dental injury.    Right Ear: External ear normal.  Left Ear: External ear normal.     Nose: Nose normal.     Mouth/Throat:     Mouth: Mucous membranes are moist.     Pharynx: Oropharynx is clear.  Eyes:     Extraocular Movements: Extraocular movements intact.     Conjunctiva/sclera: Conjunctivae normal.     Pupils: Pupils are equal, round, and reactive to light.  Cardiovascular:     Rate and Rhythm: Normal rate and regular rhythm.     Pulses: Normal pulses.     Heart sounds: Normal heart sounds.  Pulmonary:     Effort: Pulmonary effort is normal.     Breath sounds: Normal breath sounds.  Abdominal:     General: Abdomen is flat. Bowel sounds are normal.     Palpations: Abdomen is soft.  Musculoskeletal:     Cervical back: Normal range of motion and neck supple.     Comments: Mild bruising to left knee.  Good rom.   Skin:    General: Skin is warm.     Capillary Refill: Capillary refill takes less than 2 seconds.  Neurological:     General: No focal deficit present.     Mental Status: He is alert and oriented to person, place, and time.  Psychiatric:        Mood and Affect: Mood normal.        Behavior: Behavior normal.     ED Results / Procedures / Treatments   Labs (all labs ordered are listed, but only abnormal results are displayed) Labs Reviewed - No data to display  EKG None  Radiology CT Head Wo Contrast  Result Date: 08/09/2022 CLINICAL DATA:  Head trauma, minor (Age >= 65y); Neck trauma (Age >= 65y) . Pt arrives pov, slow gait with cane with c/o mechanical fall x 3 days pta, reports falling onto face. EXAM: CT HEAD WITHOUT CONTRAST CT CERVICAL SPINE WITHOUT CONTRAST TECHNIQUE: Multidetector CT imaging of the head and cervical spine was performed following the standard protocol without intravenous contrast. Multiplanar CT image reconstructions of the cervical spine were also generated. RADIATION DOSE REDUCTION: This exam was performed according to the departmental dose-optimization program which includes automated exposure control, adjustment of the mA and/or kV according to patient size and/or use of iterative reconstruction technique. COMPARISON:  CT head 05/04/2021 FINDINGS: CT HEAD FINDINGS Brain: Cerebral ventricle sizes are concordant with the degree of cerebral volume loss. Cerebral ventricle sizes are concordant with the degree of cerebral volume loss. No evidence of large-territorial acute infarction. No parenchymal hemorrhage. No mass lesion. No extra-axial collection. No mass effect or midline shift. No hydrocephalus. Basilar cisterns are patent. Vascular: No hyperdense vessel. Skull: No acute fracture or focal lesion. Sinuses/Orbits: Paranasal sinuses and mastoid air cells are clear. Bilateral lens replacement. Otherwise the orbits are unremarkable. Other: None. CT CERVICAL SPINE  FINDINGS Alignment: Grade 1 anterolisthesis of C2 on C3, C3 on C4, C4 on C5. Skull base and vertebrae: Multilevel moderate degenerative changes of the spine. No associated severe osseous neural foraminal or central canal stenosis. No acute fracture. No aggressive appearing focal osseous lesion or focal pathologic process. Soft tissues and spinal canal: No prevertebral fluid or swelling. No visible canal hematoma. Upper chest: Unremarkable. Other: None. IMPRESSION: 1. No acute intracranial abnormality. 2. No acute displaced fracture or traumatic listhesis of the cervical spine. Electronically Signed   By: Tish Frederickson M.D.   On: 08/09/2022 19:04   CT Cervical Spine Wo Contrast  Result Date: 08/09/2022 CLINICAL DATA:  Head trauma, minor (Age >= 65y); Neck trauma (Age >= 65y) . Pt arrives pov, slow gait with cane with c/o mechanical fall x 3 days pta, reports falling onto face. EXAM: CT HEAD WITHOUT CONTRAST CT CERVICAL SPINE WITHOUT CONTRAST TECHNIQUE: Multidetector CT imaging of the head and cervical spine was performed following the standard protocol without intravenous contrast. Multiplanar CT image reconstructions of the cervical spine were also generated. RADIATION DOSE REDUCTION: This exam was performed according to the departmental dose-optimization program which includes automated exposure control, adjustment of the mA and/or kV according to patient size and/or use of iterative reconstruction technique. COMPARISON:  CT head 05/04/2021 FINDINGS: CT HEAD FINDINGS Brain: Cerebral ventricle sizes are concordant with the degree of cerebral volume loss. Cerebral ventricle sizes are concordant with the degree of cerebral volume loss. No evidence of large-territorial acute infarction. No parenchymal hemorrhage. No mass lesion. No extra-axial collection. No mass effect or midline shift. No hydrocephalus. Basilar cisterns are patent. Vascular: No hyperdense vessel. Skull: No acute fracture or focal lesion.  Sinuses/Orbits: Paranasal sinuses and mastoid air cells are clear. Bilateral lens replacement. Otherwise the orbits are unremarkable. Other: None. CT CERVICAL SPINE FINDINGS Alignment: Grade 1 anterolisthesis of C2 on C3, C3 on C4, C4 on C5. Skull base and vertebrae: Multilevel moderate degenerative changes of the spine. No associated severe osseous neural foraminal or central canal stenosis. No acute fracture. No aggressive appearing focal osseous lesion or focal pathologic process. Soft tissues and spinal canal: No prevertebral fluid or swelling. No visible canal hematoma. Upper chest: Unremarkable. Other: None. IMPRESSION: 1. No acute intracranial abnormality. 2. No acute displaced fracture or traumatic listhesis of the cervical spine. Electronically Signed   By: Tish Frederickson M.D.   On: 08/09/2022 19:04    Procedures Procedures    Medications Ordered in ED Medications - No data to display  ED Course/ Medical Decision Making/ A&P                             Medical Decision Making Amount and/or Complexity of Data Reviewed Radiology: ordered.   This patient presents to the ED for concern of fall, this involves an extensive number of treatment options, and is a complaint that carries with it a high risk of complications and morbidity.  The differential diagnosis includes multiple trauma   Co morbidities that complicate the patient evaluation  htn, paroxysmal afib (on Eliquis), hypothyroidism, CM, HLD, OA, GERD, and BPH   Additional history obtained:  Additional history obtained from epic chart review External records from outside source obtained and reviewed including wife  Imaging Studies ordered:  I ordered imaging studies including ct head/neck  I independently visualized and interpreted imaging which showed   No acute intracranial abnormality.  2. No acute displaced fracture or traumatic listhesis of the  cervical spine.   I agree with the radiologist  interpretation   Cardiac Monitoring:  The patient was maintained on a cardiac monitor.  I personally viewed and interpreted the cardiac monitored which showed an underlying rhythm of: nsr   Medicines ordered and prescription drug management:  I have reviewed the patients home medicines and have made adjustments as needed   Test Considered:  ct   Critical Interventions:  ct   Problem List / ED Course:  Fall:     Reevaluation:  After the interventions noted above, I reevaluated the patient and found that they have :improved   Social Determinants of Health:  Lives at home with wife   Dispostion:  After consideration of the diagnostic results and the patients response to treatment, I feel that the patent would benefit from discharge with outpatient f/u.          Final Clinical Impression(s) / ED Diagnoses Final diagnoses:  Fall, initial encounter  On rivaroxaban therapy  Contusion of lip, initial encounter    Rx / DC Orders ED Discharge Orders     None         Jacalyn Lefevre, MD 08/09/22 [phone]

## 2022-08-09 NOTE — ED Triage Notes (Signed)
Pt arrives pov, slow gait with cane with c/o mechanical fall x 3 days pta, reports falling onto face. Pt denies loc, denies n/v, endorses thinners. Was tx at [facility] today received rib XR, endorses possible rib fx. Pt AOx4

## 2022-08-11 ENCOUNTER — Other Ambulatory Visit: Payer: Self-pay | Admitting: Cardiovascular Disease

## 2022-08-11 NOTE — Telephone Encounter (Signed)
Pt last saw Dr Royann Shivers 05/31/22, last labs 08/30/21 Creat 1.25, age 89, weight 90.7kg, based on specified criteria pt is on appropriate dosage of Eliquis  BID for afib.  Will refill rx.

## 2022-12-13 DIAGNOSIS — Z85828 Personal history of other malignant neoplasm of skin: Secondary | ICD-10-CM | POA: Diagnosis not present

## 2022-12-13 DIAGNOSIS — L821 Other seborrheic keratosis: Secondary | ICD-10-CM | POA: Diagnosis not present

## 2022-12-13 DIAGNOSIS — L570 Actinic keratosis: Secondary | ICD-10-CM | POA: Diagnosis not present

## 2022-12-13 DIAGNOSIS — D485 Neoplasm of uncertain behavior of skin: Secondary | ICD-10-CM | POA: Diagnosis not present

## 2022-12-13 DIAGNOSIS — D0471 Carcinoma in situ of skin of right lower limb, including hip: Secondary | ICD-10-CM | POA: Diagnosis not present

## 2022-12-13 DIAGNOSIS — D1801 Hemangioma of skin and subcutaneous tissue: Secondary | ICD-10-CM | POA: Diagnosis not present

## 2022-12-21 DIAGNOSIS — D0471 Carcinoma in situ of skin of right lower limb, including hip: Secondary | ICD-10-CM | POA: Diagnosis not present

## 2022-12-21 DIAGNOSIS — Z85828 Personal history of other malignant neoplasm of skin: Secondary | ICD-10-CM | POA: Diagnosis not present

## 2023-01-29 DIAGNOSIS — K5900 Constipation, unspecified: Secondary | ICD-10-CM | POA: Diagnosis not present

## 2023-01-29 DIAGNOSIS — N401 Enlarged prostate with lower urinary tract symptoms: Secondary | ICD-10-CM | POA: Diagnosis not present

## 2023-01-29 DIAGNOSIS — E782 Mixed hyperlipidemia: Secondary | ICD-10-CM | POA: Diagnosis not present

## 2023-01-29 DIAGNOSIS — R2689 Other abnormalities of gait and mobility: Secondary | ICD-10-CM | POA: Diagnosis not present

## 2023-01-29 DIAGNOSIS — G4700 Insomnia, unspecified: Secondary | ICD-10-CM | POA: Diagnosis not present

## 2023-01-29 DIAGNOSIS — I10 Essential (primary) hypertension: Secondary | ICD-10-CM | POA: Diagnosis not present

## 2023-01-29 DIAGNOSIS — Z23 Encounter for immunization: Secondary | ICD-10-CM | POA: Diagnosis not present

## 2023-01-29 DIAGNOSIS — Z8639 Personal history of other endocrine, nutritional and metabolic disease: Secondary | ICD-10-CM | POA: Diagnosis not present

## 2023-01-29 DIAGNOSIS — I480 Paroxysmal atrial fibrillation: Secondary | ICD-10-CM | POA: Diagnosis not present

## 2023-01-29 DIAGNOSIS — E039 Hypothyroidism, unspecified: Secondary | ICD-10-CM | POA: Diagnosis not present

## 2023-01-29 DIAGNOSIS — Z0000 Encounter for general adult medical examination without abnormal findings: Secondary | ICD-10-CM | POA: Diagnosis not present

## 2023-01-29 DIAGNOSIS — Z79899 Other long term (current) drug therapy: Secondary | ICD-10-CM | POA: Diagnosis not present

## 2023-01-29 DIAGNOSIS — Z1331 Encounter for screening for depression: Secondary | ICD-10-CM | POA: Diagnosis not present

## 2023-02-19 ENCOUNTER — Other Ambulatory Visit: Payer: Self-pay | Admitting: Cardiovascular Disease

## 2023-02-19 NOTE — Telephone Encounter (Signed)
Prescription refill request for Eliquis received. Indication: PAF Last office visit: 05/31/22  Judie Petit Croitoru MD Scr: 1.25 on 08/30/21 Age: 89 Weight: 94kg  Based on above findings Eliquis 5mg  twice daily is the appropriate dose.  Refill approved.  Pt is due for labs.  Requested they be done at upcoming appt with Dr C.

## 2023-03-14 DIAGNOSIS — Z85828 Personal history of other malignant neoplasm of skin: Secondary | ICD-10-CM | POA: Diagnosis not present

## 2023-03-14 DIAGNOSIS — L570 Actinic keratosis: Secondary | ICD-10-CM | POA: Diagnosis not present

## 2023-03-14 DIAGNOSIS — L821 Other seborrheic keratosis: Secondary | ICD-10-CM | POA: Diagnosis not present

## 2023-05-11 ENCOUNTER — Telehealth: Payer: Self-pay

## 2023-05-11 ENCOUNTER — Ambulatory Visit (INDEPENDENT_AMBULATORY_CARE_PROVIDER_SITE_OTHER): Payer: Medicare Other

## 2023-05-11 ENCOUNTER — Ambulatory Visit
Admission: EM | Admit: 2023-05-11 | Discharge: 2023-05-11 | Disposition: A | Discharge status: Home / Self Care | Payer: Medicare Other | Attending: Internal Medicine | Admitting: Internal Medicine

## 2023-05-11 DIAGNOSIS — N210 Calculus in bladder: Secondary | ICD-10-CM | POA: Diagnosis not present

## 2023-05-11 DIAGNOSIS — M160 Bilateral primary osteoarthritis of hip: Secondary | ICD-10-CM | POA: Diagnosis not present

## 2023-05-11 DIAGNOSIS — S300XXA Contusion of lower back and pelvis, initial encounter: Secondary | ICD-10-CM | POA: Diagnosis not present

## 2023-05-11 DIAGNOSIS — I878 Other specified disorders of veins: Secondary | ICD-10-CM | POA: Diagnosis not present

## 2023-05-11 DIAGNOSIS — M25551 Pain in right hip: Secondary | ICD-10-CM

## 2023-05-11 MED ORDER — LIDOCAINE 5 % EX PTCH: 1.0000 | MEDICATED_PATCH | CUTANEOUS | 0 refills | Status: DC

## 2023-05-11 NOTE — Discharge Instructions (Addendum)
Use heat on the brushing area for 20 mintes 2-3 times a day which will help the blood accumulation dissolve Follow up with your PCP next week  Restart the Eloquis  Saturday 1/18

## 2023-05-11 NOTE — Telephone Encounter (Signed)
LM (per provider) for patient/spouse to call for the following:  While using Lidocaine patch do not use heating pad on area.  Per Maryjo Rochester. PA-C  Will retry later in evening.  Yancey Flemings CMA

## 2023-05-11 NOTE — ED Provider Notes (Signed)
[facility]    CSN: [id number] Arrival date & time: 05/11/23  1217      History   Chief Complaint Chief Complaint  Patient presents with   Fall    HPI Tom Norton is a 89 y.o. male presents with his wife due to slipping on ice when he was unloading the groceries 4 days ago and slipped and his L buttocks hit the edge of the step, and his his R side of head and ear on the bumper. He did have have LOC and this does not hurt him like the L buttocks does. Has been icing the area, but is very painful to sit.     Past Medical History:  Diagnosis Date   BPH (benign prostatic hyperplasia)    Cardiomegaly    Dilated aortic root (HCC)    a. 09/2013: 4.2cm by echo.   Dysrhythmia    Enlarged prostate without lower urinary tract symptoms (luts)    GERD (gastroesophageal reflux disease)    rare   History of hiatal hernia    Hyperlipidemia    Hypertension    Hypothyroidism    Insomnia    Junctional bradycardia    OA (osteoarthritis)    Obesity    Paroxysmal A-fib (HCC)    a. 09/23/13 a-fib with RVR and hypotensive, requiring emergent cardioversion in ED   Peripheral neuropathy 07/17/2017   Vitamin D deficiency     Patient Active Problem List   Diagnosis Date Noted   Right inguinal hernia 09/11/2021   Fall    Open knee wound, left, initial encounter 02/17/2021   Rib fracture 02/17/2021   Intestinal obstruction without gangrene due to recurrent left inguinal hernia 05/02/2019   Dehydration 05/02/2019   Essential hypertension 05/02/2019   Dyslipidemia 05/02/2019   Hyperglycemia 05/02/2019   Primary osteoarthritis of left knee 07/20/2017   S/P knee replacement 07/20/2017   Peripheral neuropathy 07/17/2017   Paroxysmal atrial fibrillation (HCC) 10/31/2015   Hypothyroidism 10/31/2015   Dilated aortic root (HCC) 10/08/2014   Tick bite of right thigh 08/18/2014   Rash 12/10/2013   Hypotension 09/23/2013   Renal insufficiency 09/23/2013    Past Surgical  History:  Procedure Laterality Date   CARDIOVERSION  2015   CARPAL TUNNEL RELEASE Left 2019   I & D EXTREMITY Left 02/18/2021   Procedure: IRRIGATION AND DEBRIDEMENT KNEE;  Surgeon: Nadara Mustard, MD;  Location: [facility] OR;  Service: Orthopedics;  Laterality: Left;   INGUINAL HERNIA REPAIR Left 05/03/2019   Procedure: HERNIA REPAIR INGUINAL ADULT WITH MESH;  Surgeon: Darnell Level, MD;  Location: [facility] ORS;  Service: General;  Laterality: Left;   INGUINAL HERNIA REPAIR Left 1980   INGUINAL HERNIA REPAIR Right 09/12/2021   Procedure: OPEN REPAIR RIGHT INGUINAL HERNIA WITH MESH;  Surgeon: Darnell Level, MD;  Location: [facility] ORS;  Service: General;  Laterality: Right;   TOTAL KNEE ARTHROPLASTY Left 07/20/2017   Procedure: LEFT TOTAL KNEE ARTHROPLASTY;  Surgeon: Eugenia Mcalpine, MD;  Location: [facility] ORS;  Service: Orthopedics;  Laterality: Left;  Adductor Block   TRANSTHORACIC ECHOCARDIOGRAM  09/23/2013   EF 55-60%; normal wall motion. Moderately dilated aortic root and ascending aorta; mild AI; moderate bi-atrial enlargement       Home Medications    Prior to Admission medications   Medication Sig Start Date End Date Taking? Authorizing Provider  ELIQUIS 5 MG TABS tablet TAKE 1 TABLET BY MOUTH 2 TIMES DAILY. Patient taking differently: Take 5 mg by mouth 2 (two) times daily.  Last dose: 13th in AM, did not take PM dose. 02/19/23  Yes Croitoru, Mihai, MD  lidocaine (LIDODERM) 5 % Place 1 patch onto the skin daily. Remove & Discard patch within 12 hours or as directed by MD 05/11/23  Yes Rodriguez-Southworth, Nettie Elm, PA-C  Cholecalciferol (VITAMIN D3) 2000 units TABS Take 2,000 Units by mouth daily.    [provider]  CINNAMON PO Take 1,000 mg by mouth daily.     [provider]  Coenzyme Q10 (CO Q10) 100 MG CAPS Take 100 mg by mouth daily.    [provider]  cyanocobalamin 1000 MCG tablet Take 1,000 mcg by mouth daily.    [provider]  eszopiclone (LUNESTA) 1 MG TABS  tablet Take 1 tablet (1 mg total) by mouth at bedtime. Take immediately before bedtime 02/22/21   Leroy Sea, MD  finasteride (PROSCAR) 5 MG tablet Take 5 mg by mouth daily. 08/23/21   [provider]  levothyroxine (SYNTHROID) 50 MCG tablet Take 50 mcg by mouth daily before breakfast.    [provider]  Multiple Vitamin (MULTIVITAMIN WITH MINERALS) TABS Take 1 tablet by mouth daily.    [provider]  OVER THE COUNTER MEDICATION Take 1-2 capsules by mouth at bedtime as needed (sleep). Relaxium sleep aid Patient not taking: Reported on 05/31/2022    [provider]    Family History Family History  Problem Relation Age of Onset   Heart disease Father    Stroke Brother    Heart disease Brother     Social History Social History   Tobacco Use   Smoking status: Never    Passive exposure: Never   Smokeless tobacco: Never  Vaping Use   Vaping status: Never Used  Substance Use Topics   Alcohol use: Yes    Alcohol/week: 10.0 standard drinks of alcohol    Types: 10 Glasses of wine per week    Comment: couple of glasses of wine a day most days   Drug use: No     Allergies   Bee venom, Doxycycline hyclate, and Bactrim [sulfamethoxazole-trimethoprim]   Review of Systems Review of Systems  As noted in HPI Physical Exam Triage Vital Signs ED Triage Vitals  Encounter Vitals Group     BP 05/11/23 1251 120/76     Systolic BP Percentile --      Diastolic BP Percentile --      Pulse Rate 05/11/23 1251 94     Resp 05/11/23 1251 18     Temp 05/11/23 1251 97.7 F (36.5 C)     Temp Source 05/11/23 1251 Oral     SpO2 05/11/23 1251 99 %     Weight 05/11/23 1249 199 lb 15.3 oz (90.7 kg)     Height 05/11/23 1249 6' (1.829 m)     Head Circumference --      Peak Flow --      Pain Score 05/11/23 1243 8     Pain Loc --      Pain Education --      Exclude from Growth Chart --    No data found.  Updated Vital Signs BP 120/76 (BP Location: Left  Arm) Comment: single barrier of clothing  Pulse 94   Temp 97.7 F (36.5 C) (Oral)   Resp 18   Ht 6' (1.829 m)   Wt 199 lb 15.3 oz (90.7 kg)   SpO2 99%   BMI 27.12 kg/m   Visual Acuity Right Eye Distance:  Left Eye Distance:   Bilateral Distance:    Right Eye Near:   Left Eye Near:    Bilateral Near:     Physical Exam Vitals and nursing note reviewed.  Constitutional:      General: He is not in acute distress.    Appearance: He is normal weight. He is not toxic-appearing.  HENT:     Head: Atraumatic.     Comments: Non tender    Right Ear: External ear normal.     Left Ear: External ear normal.     Ears:     Comments: Has a small healing abrasion on the external R top ear which is healing well.  Eyes:     General: No scleral icterus.    Conjunctiva/sclera: Conjunctivae normal.  Pulmonary:     Effort: Pulmonary effort is normal.  Musculoskeletal:        General: Normal range of motion.     Cervical back: Neck supple.  Skin:    General: Skin is warm.     Findings: Bruising present.     Comments: Has large hematoma on L buttocks which is very tender. No open wounds seen.   Neurological:     Mental Status: He is alert and oriented to person, place, and time.     Gait: Gait normal.  Psychiatric:        Mood and Affect: Mood normal.        Behavior: Behavior normal.        Thought Content: Thought content normal.        Judgment: Judgment normal.      UC Treatments / Results  Labs (all labs ordered are listed, but only abnormal results are displayed) Labs Reviewed - No data to display  EKG   Radiology No results found. Preliminary read by me is negative for hip fracture.  Radiology reading pending  Procedures Procedures (including critical care time)  Medications Ordered in UC Medications - No data to display  Initial Impression / Assessment and Plan / UC Course  I have reviewed the triage vital signs and the nursing notes.  Pertinent  imaging  results that were available during my care of the patient were reviewed by me and considered in my medical decision making (see chart for details).  L buttock contusion and hematoma  Placed on Lidoderm patches for pain.  May switch to heat as instructed, but not when wearing the Lidoderm patch   Final Clinical Impressions(s) / UC Diagnoses   Final diagnoses:  Right hip pain  Contusion of buttock, initial encounter     Discharge Instructions      Use heat on the brushing area for 20 mintes 2-3 times a day which will help the blood accumulation dissolve Follow up with your PCP next week  Restart the Eloquis  Saturday 1/18    ED Prescriptions     Medication Sig Dispense Auth. Provider   lidocaine (LIDODERM) 5 % Place 1 patch onto the skin daily. Remove & Discard patch within 12 hours or as directed by MD 15 patch Rodriguez-Southworth, Nettie Elm, PA-C      PDMP not reviewed this encounter.   Garey Ham, PA-C 05/11/23 1344

## 2023-05-11 NOTE — ED Triage Notes (Signed)
Here with Spouse/Wife. "I was unloading groceries at the back deck near kitchen, was backing up/walking up with hand full of these, stepped and slipped on ice, when I slipped left buttocks hit edge of step and when I came down/fell down my right side of head hit bumper of care scratching top of ear". "Buttocks is really sore not really head". This happened on Monday, I stopped my blood thinners on Monday evening just in case". No loc. "I can't sit right due to the pain/injury to buttocks".

## 2023-05-11 NOTE — Telephone Encounter (Signed)
No Answer, VM received. LM #2. MyChart message sent.  Yancey Flemings CMA

## 2023-05-25 DIAGNOSIS — T148XXA Other injury of unspecified body region, initial encounter: Secondary | ICD-10-CM | POA: Diagnosis not present

## 2023-05-25 DIAGNOSIS — R2689 Other abnormalities of gait and mobility: Secondary | ICD-10-CM | POA: Diagnosis not present

## 2023-05-25 DIAGNOSIS — I480 Paroxysmal atrial fibrillation: Secondary | ICD-10-CM | POA: Diagnosis not present

## 2023-05-25 DIAGNOSIS — I10 Essential (primary) hypertension: Secondary | ICD-10-CM | POA: Diagnosis not present

## 2023-05-30 ENCOUNTER — Encounter: Payer: Self-pay | Admitting: Cardiovascular Disease

## 2023-05-30 ENCOUNTER — Ambulatory Visit: Payer: Medicare Other | Attending: Cardiovascular Disease | Admitting: Cardiovascular Disease

## 2023-05-30 VITALS — BP 131/82 | HR 78 | Ht 74.0 in | Wt 202.6 lb

## 2023-05-30 DIAGNOSIS — D6869 Other thrombophilia: Secondary | ICD-10-CM

## 2023-05-30 DIAGNOSIS — I480 Paroxysmal atrial fibrillation: Secondary | ICD-10-CM

## 2023-05-30 DIAGNOSIS — I77810 Thoracic aortic ectasia: Secondary | ICD-10-CM | POA: Diagnosis not present

## 2023-05-30 NOTE — Patient Instructions (Signed)
 Medication Instructions:  No changes.  *If you need a refill on your cardiac medications before your next appointment, please call your pharmacy*   Follow-Up: At [facility], you and your health needs are our priority.  As part of our continuing mission to provide you with exceptional heart care, we have created designated Provider Care Teams.  These Care Teams include your primary Cardiologist (physician) and Advanced Practice Providers (APPs -  Physician Assistants and Nurse Practitioners) who all work together to provide you with the care you need, when you need it.  We recommend signing up for the patient portal called "MyChart".  Sign up information is provided on this After Visit Summary.  MyChart is used to connect with patients for Virtual Visits (Telemedicine).  Patients are able to view lab/test results, encounter notes, upcoming appointments, etc.  Non-urgent messages can be sent to your provider as well.   To learn more about what you can do with MyChart, go to [email or web address].    Your next appointment:   1 year(s)  Provider:   Thurmon Fair, MD     Other Instructions
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
## 2023-07-09 DIAGNOSIS — D485 Neoplasm of uncertain behavior of skin: Secondary | ICD-10-CM | POA: Diagnosis not present

## 2023-07-09 DIAGNOSIS — Z85828 Personal history of other malignant neoplasm of skin: Secondary | ICD-10-CM | POA: Diagnosis not present

## 2023-07-09 DIAGNOSIS — C44629 Squamous cell carcinoma of skin of left upper limb, including shoulder: Secondary | ICD-10-CM | POA: Diagnosis not present

## 2023-08-01 DIAGNOSIS — I10 Essential (primary) hypertension: Secondary | ICD-10-CM | POA: Diagnosis not present

## 2023-08-01 DIAGNOSIS — C4490 Unspecified malignant neoplasm of skin, unspecified: Secondary | ICD-10-CM | POA: Diagnosis not present

## 2023-08-01 DIAGNOSIS — E039 Hypothyroidism, unspecified: Secondary | ICD-10-CM | POA: Diagnosis not present

## 2023-08-01 DIAGNOSIS — N401 Enlarged prostate with lower urinary tract symptoms: Secondary | ICD-10-CM | POA: Diagnosis not present

## 2023-08-01 DIAGNOSIS — I480 Paroxysmal atrial fibrillation: Secondary | ICD-10-CM | POA: Diagnosis not present

## 2023-09-10 DIAGNOSIS — I480 Paroxysmal atrial fibrillation: Secondary | ICD-10-CM | POA: Diagnosis not present

## 2023-09-10 DIAGNOSIS — I10 Essential (primary) hypertension: Secondary | ICD-10-CM | POA: Diagnosis not present

## 2023-09-20 ENCOUNTER — Other Ambulatory Visit: Payer: Self-pay | Admitting: Cardiovascular Disease

## 2023-09-20 NOTE — Telephone Encounter (Signed)
 Prescription refill request for Tom Norton  received. Indication: PAF Last office visit: 05/30/23  [name] MD Scr: 1.25 on 08/30/21 Age: 89 Weight: 91.9kg  Based on above findings Tom Norton  5mg  twice daily is the appropriate dose.  Pt is past due for labs.  Requested they be done at upcoming appt with Dr C. Refill approved.

## 2023-09-22 DIAGNOSIS — E782 Mixed hyperlipidemia: Secondary | ICD-10-CM | POA: Diagnosis not present

## 2023-09-22 DIAGNOSIS — I480 Paroxysmal atrial fibrillation: Secondary | ICD-10-CM | POA: Diagnosis not present

## 2023-09-22 DIAGNOSIS — I10 Essential (primary) hypertension: Secondary | ICD-10-CM | POA: Diagnosis not present

## 2023-09-22 DIAGNOSIS — N401 Enlarged prostate with lower urinary tract symptoms: Secondary | ICD-10-CM | POA: Diagnosis not present

## 2023-10-09 DIAGNOSIS — I480 Paroxysmal atrial fibrillation: Secondary | ICD-10-CM | POA: Diagnosis not present

## 2023-10-09 DIAGNOSIS — I10 Essential (primary) hypertension: Secondary | ICD-10-CM | POA: Diagnosis not present

## 2023-10-17 ENCOUNTER — Ambulatory Visit: Attending: Physician Assistant | Admitting: Cardiology

## 2023-10-17 VITALS — BP 132/82 | Ht 66.0 in | Wt 197.0 lb

## 2023-10-17 DIAGNOSIS — I9589 Other hypotension: Secondary | ICD-10-CM | POA: Insufficient documentation

## 2023-10-17 DIAGNOSIS — I480 Paroxysmal atrial fibrillation: Secondary | ICD-10-CM | POA: Diagnosis not present

## 2023-10-17 NOTE — Progress Notes (Signed)
 Cardiology Office Note:  .   Date:  10/17/2023  ID:  Tom Norton, DOB 04/10/1930, MRN [id number] PCP: Charlott Dorn LABOR, MD  [facility] Providers Cardiologist:  Jerel Balding, MD {  History of Present Illness: .   Tom Norton is a 89 y.o. male with history of paroxysmal atrial fibrillation status post DCCV 2015, ascending aortic dilation, HTN, mild AI.     Paroxysmal atrial fibrillation Cardioversion in 2015, no documented recurrences. Reportedly intolerant to beta-blockers due to hair loss, fatigue.  Verapamil  had fatigue.  Social history  Lives at home with his wife in a house.      Patient with history of paroxysmal atrial fibrillation status post DCCV 2015.  No documented recurrences.  He last saw Dr. Balding February 2025 and had sustained a fall on the ice, no significant bleeding, fractures, head trauma.  Today he is here for follow-up with his wife to discuss his blood pressure.  He reports that his blood pressure machine transmits the readings to his primary care provider.  He had been getting abnormal readings with blood pressures as low as 60 systolics on an automatic cuff and was recommended to follow-up with cardiology. These are transmitted to a central system for monitoring. He is not specifically checking this because of symptoms when this happens. He has not been symptomatic from these episodes and does not report any syncope, dizziness, lightheadedness.  He is only had a handful of abnormal readings but otherwise has blood pressures that he reports are in the 130s.  However he did not bring device log with him.  Does not note any episodes of palpitations, tachycardia, shortness of breath, chest pain.  He does report chronic fatigue that is been going on for several years that is relatively unchanged. There is no component of orthostasis.  Still reports being functional and still exercises on the elliptical machine with his wife.   ROS: Denies:  Chest pain, shortness of breath, orthopnea, peripheral edema, palpitations, decreased exercise intolerance, fatigue, lightheadedness.   Studies Reviewed: SABRA    EKG Interpretation Date/Time:  Wednesday October 17 2023 15:39:48 EDT Ventricular Rate:  88 PR Interval:  172 QRS Duration:  88 QT Interval:  354 QTC Calculation: 428 R Axis:   -34  Text Interpretation: Normal sinus rhythm Left axis deviation Pulmonary disease pattern When compared with ECG of 30-May-2023 10:09, No significant change was found Confirmed by Darryle Currier [phone]) on 10/17/2023 4:17:58 PM    Risk Assessment/Calculations:    CHA2DS2-VASc Score = 3  This indicates a 3.2% annual risk of stroke. The patient's score is based upon: CHF History: 0 HTN History: 1 Diabetes History: 0 Stroke History: 0 Vascular Disease History: 0 Age Score: 2 Gender Score: 0  Physical Exam:   VS:  BP 132/82   Ht 5' 6 (1.676 m)   Wt 197 lb (89.4 kg)   SpO2 95%   BMI 31.80 kg/m    Wt Readings from Last 3 Encounters:  10/17/23 197 lb (89.4 kg)  05/30/23 202 lb 9.6 oz (91.9 kg)  05/11/23 199 lb 15.3 oz (90.7 kg)    GEN: Well nourished, well developed in no acute distress NECK: No JVD; No carotid bruits CARDIAC: RRR, 2/6 systolic mumur (diffuse but best LSB) RESPIRATORY:  Clear to auscultation without rales, wheezing or rhonchi  ABDOMEN: Soft, non-tender, non-distended EXTREMITIES:  No edema; No deformity   ASSESSMENT AND PLAN: .    Hypotension Suspect readings are inaccurate.  It is possible  that he is having bouts of paroxysmal atrial fibrillation and the blood pressure cuff is not reading the pulse accurately.  He does not demonstrate any significant symptoms of dizziness, syncope, falls.  He is not on any antihypertensives.  Blood pressure today is in the 130 systolic and he is maintaining NSR. Given lack of symptoms and reluctance he to use anything like midodrine I think it is reasonable to continue to monitor for now unless  he has persistently low readings. He will notify us  if this is not the case. If he has abnormal reading advised taking blood pressure with another cuff.  If he is getting consecutive abnormal readings may consider nurse visit to compare these measurements, may need to update labs. He will also be seeing primary care as well  Paroxysmal atrial fibrillation Status post cardioversion 2015.  No documented recurrences.  Could possibly be having paroxysmal asymptomatic episodes but today's EKG shows sinus rhythm. Continue with Eliquis  5 mg twice daily.  Labs from [place] 07/2023 showed Cr 1.21, last CBC 01/2023 was normal. Does not tolerate beta-blocker or verapamil   Ascending aortic dilation Asymptomatic and at age 78 not likely to be a candidate for repair.  Through shared decision making decided to forego routine monitoring. Also had mild AI on prior echo, follow clinically.       Dispo: 1 year follow-up with Dr. Francyne.  Given advanced age and overall treatment plan is conservative.  Signed, Thom LITTIE Sluder, PA-C

## 2023-10-17 NOTE — Patient Instructions (Signed)
 Medication Instructions:  No medication changes were made during today's visit.   Continue to monitor blood pressure. If you are getting low readings, check it again with a 2nd cuff.  Call the office if your are getting persistently low blood pressure readings.  *If you need a refill on your cardiac medications before your next appointment, please call your pharmacy*   Lab Work: No labs were ordered during today's visit.  If you have labs (blood work) drawn today and your tests are completely normal, you will receive your results only by: MyChart Message (if you have MyChart) OR A paper copy in the mail If you have any lab test that is abnormal or we need to change your treatment, we will call you to review the results.   Testing/Procedures: No procedures were ordered during today's visit.   Follow-Up: At [facility], you and your health needs are our priority.  As part of our continuing mission to provide you with exceptional heart care, we have created designated Provider Care Teams.  These Care Teams include your primary Cardiologist (physician) and Advanced Practice Providers (APPs -  Physician Assistants and Nurse Practitioners) who all work together to provide you with the care you need, when you need it.  We recommend signing up for the patient portal called MyChart.  Sign up information is provided on this After Visit Summary.  MyChart is used to connect with patients for Virtual Visits (Telemedicine).  Patients are able to view lab/test results, encounter notes, upcoming appointments, etc.  Non-urgent messages can be sent to your provider as well.   To learn more about what you can do with MyChart, go to [email or web address].    Your next appointment:   February 2026. A letter will be mailed to you as a reminder to call the office for your next follow up appointment.   Provider:   Jerel Balding, MD    Other Instructions Thank you for choosing [facility]!

## 2023-10-22 DIAGNOSIS — I480 Paroxysmal atrial fibrillation: Secondary | ICD-10-CM | POA: Diagnosis not present

## 2023-10-22 DIAGNOSIS — I10 Essential (primary) hypertension: Secondary | ICD-10-CM | POA: Diagnosis not present

## 2023-10-22 DIAGNOSIS — N401 Enlarged prostate with lower urinary tract symptoms: Secondary | ICD-10-CM | POA: Diagnosis not present

## 2023-10-22 DIAGNOSIS — E782 Mixed hyperlipidemia: Secondary | ICD-10-CM | POA: Diagnosis not present

## 2023-11-08 DIAGNOSIS — I480 Paroxysmal atrial fibrillation: Secondary | ICD-10-CM | POA: Diagnosis not present

## 2023-11-08 DIAGNOSIS — I10 Essential (primary) hypertension: Secondary | ICD-10-CM | POA: Diagnosis not present

## 2023-12-13 DIAGNOSIS — L720 Epidermal cyst: Secondary | ICD-10-CM | POA: Diagnosis not present

## 2023-12-13 DIAGNOSIS — D2262 Melanocytic nevi of left upper limb, including shoulder: Secondary | ICD-10-CM | POA: Diagnosis not present

## 2023-12-13 DIAGNOSIS — Z85828 Personal history of other malignant neoplasm of skin: Secondary | ICD-10-CM | POA: Diagnosis not present

## 2023-12-13 DIAGNOSIS — D2261 Melanocytic nevi of right upper limb, including shoulder: Secondary | ICD-10-CM | POA: Diagnosis not present

## 2023-12-13 DIAGNOSIS — L905 Scar conditions and fibrosis of skin: Secondary | ICD-10-CM | POA: Diagnosis not present

## 2023-12-13 DIAGNOSIS — D0461 Carcinoma in situ of skin of right upper limb, including shoulder: Secondary | ICD-10-CM | POA: Diagnosis not present

## 2023-12-13 DIAGNOSIS — D225 Melanocytic nevi of trunk: Secondary | ICD-10-CM | POA: Diagnosis not present

## 2023-12-13 DIAGNOSIS — L821 Other seborrheic keratosis: Secondary | ICD-10-CM | POA: Diagnosis not present

## 2023-12-13 DIAGNOSIS — D485 Neoplasm of uncertain behavior of skin: Secondary | ICD-10-CM | POA: Diagnosis not present

## 2024-01-30 DIAGNOSIS — J029 Acute pharyngitis, unspecified: Secondary | ICD-10-CM | POA: Diagnosis not present

## 2024-01-30 DIAGNOSIS — Z03818 Encounter for observation for suspected exposure to other biological agents ruled out: Secondary | ICD-10-CM | POA: Diagnosis not present

## 2024-02-06 DIAGNOSIS — Z8639 Personal history of other endocrine, nutritional and metabolic disease: Secondary | ICD-10-CM | POA: Diagnosis not present

## 2024-02-06 DIAGNOSIS — Z0000 Encounter for general adult medical examination without abnormal findings: Secondary | ICD-10-CM | POA: Diagnosis not present

## 2024-02-06 DIAGNOSIS — Z23 Encounter for immunization: Secondary | ICD-10-CM | POA: Diagnosis not present

## 2024-02-06 DIAGNOSIS — D7589 Other specified diseases of blood and blood-forming organs: Secondary | ICD-10-CM | POA: Diagnosis not present

## 2024-02-06 DIAGNOSIS — E039 Hypothyroidism, unspecified: Secondary | ICD-10-CM | POA: Diagnosis not present

## 2024-02-06 DIAGNOSIS — E782 Mixed hyperlipidemia: Secondary | ICD-10-CM | POA: Diagnosis not present

## 2024-02-06 DIAGNOSIS — G4700 Insomnia, unspecified: Secondary | ICD-10-CM | POA: Diagnosis not present

## 2024-02-06 DIAGNOSIS — I480 Paroxysmal atrial fibrillation: Secondary | ICD-10-CM | POA: Diagnosis not present

## 2024-02-06 DIAGNOSIS — C4490 Unspecified malignant neoplasm of skin, unspecified: Secondary | ICD-10-CM | POA: Diagnosis not present

## 2024-02-06 DIAGNOSIS — I10 Essential (primary) hypertension: Secondary | ICD-10-CM | POA: Diagnosis not present

## 2024-02-06 DIAGNOSIS — N401 Enlarged prostate with lower urinary tract symptoms: Secondary | ICD-10-CM | POA: Diagnosis not present

## 2024-02-06 DIAGNOSIS — Z79899 Other long term (current) drug therapy: Secondary | ICD-10-CM | POA: Diagnosis not present

## 2024-03-24 ENCOUNTER — Other Ambulatory Visit: Payer: Self-pay | Admitting: Cardiovascular Disease

## 2024-03-24 NOTE — Telephone Encounter (Signed)
 Prescription refill request for Eliquis  received. Indication:AFIB Last office visit:6/25 Scr: 1.11  2025 Age:89 Weight:89.4  kg  Prescription refilled

## 2024-04-17 ENCOUNTER — Other Ambulatory Visit: Payer: Self-pay

## 2024-04-17 ENCOUNTER — Encounter (HOSPITAL_COMMUNITY): Payer: Self-pay

## 2024-04-17 ENCOUNTER — Inpatient Hospital Stay (HOSPITAL_COMMUNITY)
Admission: EM | Admit: 2024-04-17 | Discharge: 2024-05-25 | DRG: 956 | Disposition: E | Attending: General Surgery | Admitting: General Surgery

## 2024-04-17 ENCOUNTER — Emergency Department (HOSPITAL_COMMUNITY)

## 2024-04-17 DIAGNOSIS — Y95 Nosocomial condition: Secondary | ICD-10-CM | POA: Diagnosis not present

## 2024-04-17 DIAGNOSIS — Z66 Do not resuscitate: Secondary | ICD-10-CM | POA: Diagnosis not present

## 2024-04-17 DIAGNOSIS — I480 Paroxysmal atrial fibrillation: Secondary | ICD-10-CM | POA: Diagnosis not present

## 2024-04-17 DIAGNOSIS — I69391 Dysphagia following cerebral infarction: Secondary | ICD-10-CM | POA: Diagnosis not present

## 2024-04-17 DIAGNOSIS — E8770 Fluid overload, unspecified: Secondary | ICD-10-CM | POA: Diagnosis not present

## 2024-04-17 DIAGNOSIS — Z7401 Bed confinement status: Secondary | ICD-10-CM

## 2024-04-17 DIAGNOSIS — E039 Hypothyroidism, unspecified: Secondary | ICD-10-CM | POA: Diagnosis not present

## 2024-04-17 DIAGNOSIS — R570 Cardiogenic shock: Secondary | ICD-10-CM | POA: Diagnosis not present

## 2024-04-17 DIAGNOSIS — R571 Hypovolemic shock: Secondary | ICD-10-CM | POA: Diagnosis not present

## 2024-04-17 DIAGNOSIS — J9601 Acute respiratory failure with hypoxia: Secondary | ICD-10-CM | POA: Diagnosis not present

## 2024-04-17 DIAGNOSIS — S066XAA Traumatic subarachnoid hemorrhage with loss of consciousness status unknown, initial encounter: Secondary | ICD-10-CM | POA: Diagnosis present

## 2024-04-17 DIAGNOSIS — G9340 Encephalopathy, unspecified: Secondary | ICD-10-CM | POA: Diagnosis not present

## 2024-04-17 DIAGNOSIS — R579 Shock, unspecified: Secondary | ICD-10-CM | POA: Diagnosis not present

## 2024-04-17 DIAGNOSIS — S065XAA Traumatic subdural hemorrhage with loss of consciousness status unknown, initial encounter: Secondary | ICD-10-CM | POA: Diagnosis present

## 2024-04-17 DIAGNOSIS — N189 Chronic kidney disease, unspecified: Secondary | ICD-10-CM | POA: Diagnosis not present

## 2024-04-17 DIAGNOSIS — R1310 Dysphagia, unspecified: Secondary | ICD-10-CM | POA: Diagnosis present

## 2024-04-17 DIAGNOSIS — S72041A Displaced fracture of base of neck of right femur, initial encounter for closed fracture: Secondary | ICD-10-CM | POA: Diagnosis present

## 2024-04-17 DIAGNOSIS — D62 Acute posthemorrhagic anemia: Secondary | ICD-10-CM | POA: Diagnosis not present

## 2024-04-17 DIAGNOSIS — I609 Nontraumatic subarachnoid hemorrhage, unspecified: Secondary | ICD-10-CM

## 2024-04-17 DIAGNOSIS — Z882 Allergy status to sulfonamides status: Secondary | ICD-10-CM

## 2024-04-17 DIAGNOSIS — Z96652 Presence of left artificial knee joint: Secondary | ICD-10-CM | POA: Diagnosis present

## 2024-04-17 DIAGNOSIS — N179 Acute kidney failure, unspecified: Secondary | ICD-10-CM | POA: Diagnosis not present

## 2024-04-17 DIAGNOSIS — S298XXA Other specified injuries of thorax, initial encounter: Secondary | ICD-10-CM | POA: Diagnosis present

## 2024-04-17 DIAGNOSIS — S22089A Unspecified fracture of T11-T12 vertebra, initial encounter for closed fracture: Secondary | ICD-10-CM | POA: Diagnosis present

## 2024-04-17 DIAGNOSIS — J9811 Atelectasis: Secondary | ICD-10-CM | POA: Diagnosis not present

## 2024-04-17 DIAGNOSIS — M419 Scoliosis, unspecified: Secondary | ICD-10-CM | POA: Diagnosis present

## 2024-04-17 DIAGNOSIS — S062XAA Diffuse traumatic brain injury with loss of consciousness status unknown, initial encounter: Secondary | ICD-10-CM | POA: Diagnosis present

## 2024-04-17 DIAGNOSIS — Z992 Dependence on renal dialysis: Secondary | ICD-10-CM | POA: Diagnosis not present

## 2024-04-17 DIAGNOSIS — Y93H9 Activity, other involving exterior property and land maintenance, building and construction: Secondary | ICD-10-CM

## 2024-04-17 DIAGNOSIS — R402222 Coma scale, best verbal response, incomprehensible words, at arrival to emergency department: Secondary | ICD-10-CM | POA: Diagnosis present

## 2024-04-17 DIAGNOSIS — S22019A Unspecified fracture of first thoracic vertebra, initial encounter for closed fracture: Secondary | ICD-10-CM | POA: Diagnosis present

## 2024-04-17 DIAGNOSIS — A419 Sepsis, unspecified organism: Secondary | ICD-10-CM | POA: Diagnosis not present

## 2024-04-17 DIAGNOSIS — I6389 Other cerebral infarction: Secondary | ICD-10-CM | POA: Diagnosis not present

## 2024-04-17 DIAGNOSIS — D649 Anemia, unspecified: Secondary | ICD-10-CM | POA: Diagnosis not present

## 2024-04-17 DIAGNOSIS — S22039A Unspecified fracture of third thoracic vertebra, initial encounter for closed fracture: Secondary | ICD-10-CM | POA: Diagnosis present

## 2024-04-17 DIAGNOSIS — S72001A Fracture of unspecified part of neck of right femur, initial encounter for closed fracture: Secondary | ICD-10-CM | POA: Diagnosis not present

## 2024-04-17 DIAGNOSIS — S72141A Displaced intertrochanteric fracture of right femur, initial encounter for closed fracture: Secondary | ICD-10-CM | POA: Diagnosis not present

## 2024-04-17 DIAGNOSIS — R0689 Other abnormalities of breathing: Secondary | ICD-10-CM | POA: Diagnosis not present

## 2024-04-17 DIAGNOSIS — Z6831 Body mass index (BMI) 31.0-31.9, adult: Secondary | ICD-10-CM | POA: Diagnosis not present

## 2024-04-17 DIAGNOSIS — Z7901 Long term (current) use of anticoagulants: Secondary | ICD-10-CM

## 2024-04-17 DIAGNOSIS — I82431 Acute embolism and thrombosis of right popliteal vein: Secondary | ICD-10-CM | POA: Diagnosis not present

## 2024-04-17 DIAGNOSIS — W19XXXA Unspecified fall, initial encounter: Secondary | ICD-10-CM | POA: Diagnosis not present

## 2024-04-17 DIAGNOSIS — Z888 Allergy status to other drugs, medicaments and biological substances status: Secondary | ICD-10-CM

## 2024-04-17 DIAGNOSIS — Z515 Encounter for palliative care: Secondary | ICD-10-CM | POA: Diagnosis not present

## 2024-04-17 DIAGNOSIS — I82443 Acute embolism and thrombosis of tibial vein, bilateral: Secondary | ICD-10-CM | POA: Diagnosis not present

## 2024-04-17 DIAGNOSIS — G928 Other toxic encephalopathy: Secondary | ICD-10-CM | POA: Diagnosis not present

## 2024-04-17 DIAGNOSIS — S0291XA Unspecified fracture of skull, initial encounter for closed fracture: Secondary | ICD-10-CM | POA: Diagnosis not present

## 2024-04-17 DIAGNOSIS — J9600 Acute respiratory failure, unspecified whether with hypoxia or hypercapnia: Secondary | ICD-10-CM | POA: Diagnosis not present

## 2024-04-17 DIAGNOSIS — E669 Obesity, unspecified: Secondary | ICD-10-CM | POA: Diagnosis not present

## 2024-04-17 DIAGNOSIS — M7989 Other specified soft tissue disorders: Secondary | ICD-10-CM | POA: Diagnosis not present

## 2024-04-17 DIAGNOSIS — S0101XA Laceration without foreign body of scalp, initial encounter: Secondary | ICD-10-CM | POA: Diagnosis present

## 2024-04-17 DIAGNOSIS — S22049A Unspecified fracture of fourth thoracic vertebra, initial encounter for closed fracture: Secondary | ICD-10-CM | POA: Diagnosis present

## 2024-04-17 DIAGNOSIS — S22029A Unspecified fracture of second thoracic vertebra, initial encounter for closed fracture: Secondary | ICD-10-CM | POA: Diagnosis present

## 2024-04-17 DIAGNOSIS — R001 Bradycardia, unspecified: Secondary | ICD-10-CM | POA: Diagnosis not present

## 2024-04-17 DIAGNOSIS — W130XXA Fall from, out of or through balcony, initial encounter: Secondary | ICD-10-CM | POA: Diagnosis present

## 2024-04-17 DIAGNOSIS — Z79890 Hormone replacement therapy (postmenopausal): Secondary | ICD-10-CM

## 2024-04-17 DIAGNOSIS — S7224XA Nondisplaced subtrochanteric fracture of right femur, initial encounter for closed fracture: Secondary | ICD-10-CM | POA: Diagnosis not present

## 2024-04-17 DIAGNOSIS — N400 Enlarged prostate without lower urinary tract symptoms: Secondary | ICD-10-CM | POA: Diagnosis present

## 2024-04-17 DIAGNOSIS — K219 Gastro-esophageal reflux disease without esophagitis: Secondary | ICD-10-CM | POA: Diagnosis present

## 2024-04-17 DIAGNOSIS — J150 Pneumonia due to Klebsiella pneumoniae: Secondary | ICD-10-CM | POA: Diagnosis not present

## 2024-04-17 DIAGNOSIS — S270XXA Traumatic pneumothorax, initial encounter: Secondary | ICD-10-CM | POA: Diagnosis not present

## 2024-04-17 DIAGNOSIS — R54 Age-related physical debility: Secondary | ICD-10-CM | POA: Diagnosis present

## 2024-04-17 DIAGNOSIS — J189 Pneumonia, unspecified organism: Secondary | ICD-10-CM | POA: Diagnosis not present

## 2024-04-17 DIAGNOSIS — I350 Nonrheumatic aortic (valve) stenosis: Secondary | ICD-10-CM | POA: Diagnosis not present

## 2024-04-17 DIAGNOSIS — I4891 Unspecified atrial fibrillation: Secondary | ICD-10-CM | POA: Diagnosis not present

## 2024-04-17 DIAGNOSIS — W132XXA Fall from, out of or through roof, initial encounter: Secondary | ICD-10-CM | POA: Diagnosis not present

## 2024-04-17 DIAGNOSIS — S2243XA Multiple fractures of ribs, bilateral, initial encounter for closed fracture: Secondary | ICD-10-CM | POA: Diagnosis present

## 2024-04-17 DIAGNOSIS — R402342 Coma scale, best motor response, flexion withdrawal, at arrival to emergency department: Secondary | ICD-10-CM | POA: Diagnosis present

## 2024-04-17 DIAGNOSIS — I63432 Cerebral infarction due to embolism of left posterior cerebral artery: Secondary | ICD-10-CM | POA: Diagnosis not present

## 2024-04-17 DIAGNOSIS — Z91030 Bee allergy status: Secondary | ICD-10-CM

## 2024-04-17 DIAGNOSIS — R402142 Coma scale, eyes open, spontaneous, at arrival to emergency department: Secondary | ICD-10-CM | POA: Diagnosis present

## 2024-04-17 DIAGNOSIS — I82461 Acute embolism and thrombosis of right calf muscular vein: Secondary | ICD-10-CM | POA: Diagnosis not present

## 2024-04-17 DIAGNOSIS — S7221XA Displaced subtrochanteric fracture of right femur, initial encounter for closed fracture: Secondary | ICD-10-CM | POA: Diagnosis present

## 2024-04-17 DIAGNOSIS — Z823 Family history of stroke: Secondary | ICD-10-CM

## 2024-04-17 DIAGNOSIS — E559 Vitamin D deficiency, unspecified: Secondary | ICD-10-CM | POA: Diagnosis present

## 2024-04-17 DIAGNOSIS — R6521 Severe sepsis with septic shock: Secondary | ICD-10-CM | POA: Diagnosis not present

## 2024-04-17 DIAGNOSIS — Z751 Person awaiting admission to adequate facility elsewhere: Secondary | ICD-10-CM

## 2024-04-17 DIAGNOSIS — S3022XA Contusion of scrotum and testes, initial encounter: Secondary | ICD-10-CM | POA: Diagnosis present

## 2024-04-17 DIAGNOSIS — I6340 Cerebral infarction due to embolism of unspecified cerebral artery: Secondary | ICD-10-CM | POA: Diagnosis not present

## 2024-04-17 DIAGNOSIS — E785 Hyperlipidemia, unspecified: Secondary | ICD-10-CM | POA: Diagnosis present

## 2024-04-17 DIAGNOSIS — Z7189 Other specified counseling: Secondary | ICD-10-CM | POA: Diagnosis not present

## 2024-04-17 DIAGNOSIS — S7000XA Contusion of unspecified hip, initial encounter: Secondary | ICD-10-CM | POA: Diagnosis present

## 2024-04-17 DIAGNOSIS — Y92009 Unspecified place in unspecified non-institutional (private) residence as the place of occurrence of the external cause: Secondary | ICD-10-CM | POA: Diagnosis not present

## 2024-04-17 DIAGNOSIS — M6289 Other specified disorders of muscle: Secondary | ICD-10-CM | POA: Diagnosis present

## 2024-04-17 DIAGNOSIS — I129 Hypertensive chronic kidney disease with stage 1 through stage 4 chronic kidney disease, or unspecified chronic kidney disease: Secondary | ICD-10-CM | POA: Diagnosis not present

## 2024-04-17 DIAGNOSIS — S80821A Blister (nonthermal), right lower leg, initial encounter: Secondary | ICD-10-CM | POA: Diagnosis not present

## 2024-04-17 DIAGNOSIS — I130 Hypertensive heart and chronic kidney disease with heart failure and stage 1 through stage 4 chronic kidney disease, or unspecified chronic kidney disease: Secondary | ICD-10-CM | POA: Diagnosis present

## 2024-04-17 DIAGNOSIS — I82452 Acute embolism and thrombosis of left peroneal vein: Secondary | ICD-10-CM | POA: Diagnosis not present

## 2024-04-17 DIAGNOSIS — S020XXA Fracture of vault of skull, initial encounter for closed fracture: Secondary | ICD-10-CM | POA: Diagnosis present

## 2024-04-17 DIAGNOSIS — E875 Hyperkalemia: Secondary | ICD-10-CM | POA: Diagnosis not present

## 2024-04-17 DIAGNOSIS — Z8249 Family history of ischemic heart disease and other diseases of the circulatory system: Secondary | ICD-10-CM

## 2024-04-17 DIAGNOSIS — Z881 Allergy status to other antibiotic agents status: Secondary | ICD-10-CM

## 2024-04-17 DIAGNOSIS — T07XXXA Unspecified multiple injuries, initial encounter: Secondary | ICD-10-CM | POA: Diagnosis not present

## 2024-04-17 DIAGNOSIS — Z79899 Other long term (current) drug therapy: Secondary | ICD-10-CM

## 2024-04-17 DIAGNOSIS — I10 Essential (primary) hypertension: Secondary | ICD-10-CM | POA: Diagnosis not present

## 2024-04-17 LAB — ETHANOL: Alcohol, Ethyl (B): <15 mg/dL

## 2024-04-17 LAB — COMPREHENSIVE METABOLIC PANEL WITH GFR
ALT: 28 U/L (ref 0–44)
AST: 51 U/L — ABNORMAL HIGH (ref 15–41)
Albumin: 3.7 g/dL (ref 3.5–5.0)
Alkaline Phosphatase: 77 U/L (ref 38–126)
Anion gap: 12 (ref 5–15)
BUN: 22 mg/dL (ref 8–23)
CO2: 24 mmol/L (ref 22–32)
Calcium: 9.3 mg/dL (ref 8.9–10.3)
Chloride: 107 mmol/L (ref 98–111)
Creatinine, Ser: 1.32 mg/dL — ABNORMAL HIGH (ref 0.61–1.24)
GFR, Estimated: 51 mL/min — ABNORMAL LOW
Glucose, Bld: 154 mg/dL — ABNORMAL HIGH (ref 70–99)
Potassium: 4 mmol/L (ref 3.5–5.1)
Sodium: 142 mmol/L (ref 135–145)
Total Bilirubin: 0.5 mg/dL (ref 0.0–1.2)
Total Protein: 5.8 g/dL — ABNORMAL LOW (ref 6.5–8.1)

## 2024-04-17 LAB — PROTIME-INR
INR: 1.3 — ABNORMAL HIGH (ref 0.8–1.2)
Prothrombin Time: 17.1 s — ABNORMAL HIGH (ref 11.4–15.2)

## 2024-04-17 LAB — I-STAT CHEM 8, ED
BUN: 22 mg/dL (ref 8–23)
Calcium, Ion: 1.14 mmol/L — ABNORMAL LOW (ref 1.15–1.40)
Chloride: 107 mmol/L (ref 98–111)
Creatinine, Ser: 1.3 mg/dL — ABNORMAL HIGH (ref 0.61–1.24)
Glucose, Bld: 151 mg/dL — ABNORMAL HIGH (ref 70–99)
HCT: 40 % (ref 39.0–52.0)
Hemoglobin: 13.6 g/dL (ref 13.0–17.0)
Potassium: 3.9 mmol/L (ref 3.5–5.1)
Sodium: 143 mmol/L (ref 135–145)
TCO2: 22 mmol/L (ref 22–32)

## 2024-04-17 LAB — I-STAT CG4 LACTIC ACID, ED: Lactic Acid, Venous: 2.9 mmol/L (ref 0.5–1.9)

## 2024-04-17 LAB — SAMPLE TO BLOOD BANK

## 2024-04-17 LAB — CBC
HCT: 38.3 % — ABNORMAL LOW (ref 39.0–52.0)
Hemoglobin: 12.7 g/dL — ABNORMAL LOW (ref 13.0–17.0)
MCH: 32.1 pg (ref 26.0–34.0)
MCHC: 33.2 g/dL (ref 30.0–36.0)
MCV: 96.7 fL (ref 80.0–100.0)
Platelets: 184 K/uL (ref 150–400)
RBC: 3.96 MIL/uL — ABNORMAL LOW (ref 4.22–5.81)
RDW: 13.4 % (ref 11.5–15.5)
WBC: 10.4 K/uL (ref 4.0–10.5)
nRBC: 0 % (ref 0.0–0.2)

## 2024-04-17 LAB — PREPARE RBC (CROSSMATCH)

## 2024-04-17 LAB — MRSA NEXT GEN BY PCR, NASAL: MRSA by PCR Next Gen: NOT DETECTED

## 2024-04-17 MED ORDER — SODIUM CHLORIDE 0.9% FLUSH
10.0000 mL | Freq: Two times a day (BID) | INTRAVENOUS | Status: DC
  Administered 2024-04-18: 20 mL
  Administered 2024-04-18: 10 mL
  Administered 2024-04-18: 30 mL
  Administered 2024-04-19: 20 mL
  Administered 2024-04-19 – 2024-04-20 (×2): 10 mL
  Administered 2024-04-20: 20 mL
  Administered 2024-04-21 (×2): 10 mL
  Administered 2024-04-22: 20 mL
  Administered 2024-04-22 – 2024-04-24 (×4): 10 mL
  Administered 2024-04-24 – 2024-04-25 (×2): 30 mL
  Administered 2024-04-25 – 2024-04-26 (×2): 10 mL
  Administered 2024-04-26: 20 mL
  Administered 2024-04-27 – 2024-04-28 (×3): 10 mL
  Administered 2024-04-28 – 2024-04-29 (×2): 30 mL
  Administered 2024-04-30 – 2024-05-01 (×3): 10 mL
  Administered 2024-05-01: 30 mL
  Administered 2024-05-02: 20 mL
  Administered 2024-05-02: 30 mL
  Administered 2024-05-03: 10 mL
  Administered 2024-05-03: 20 mL
  Administered 2024-05-04: 10 mL
  Administered 2024-05-04: 20 mL

## 2024-04-17 MED ORDER — SODIUM CHLORIDE 0.9 % IV BOLUS
1000.0000 mL | Freq: Once | INTRAVENOUS | Status: AC
  Administered 2024-04-18: 1000 mL via INTRAVENOUS

## 2024-04-17 MED ORDER — HYDROMORPHONE HCL 1 MG/ML IJ SOLN
0.5000 mg | INTRAMUSCULAR | Status: DC | PRN
  Administered 2024-04-17 – 2024-04-27 (×18): 0.5 mg via INTRAVENOUS
  Filled 2024-04-17 (×14): qty 1

## 2024-04-17 MED ORDER — PROTHROMBIN COMPLEX CONC HUMAN 500 UNITS IV KIT
4440.0000 [IU] | PACK | Status: AC
  Administered 2024-04-17: 4440 [IU] via INTRAVENOUS
  Filled 2024-04-17: qty 4440

## 2024-04-17 MED ORDER — ACETAMINOPHEN 10 MG/ML IV SOLN
1000.0000 mg | Freq: Four times a day (QID) | INTRAVENOUS | Status: AC
  Administered 2024-04-17 – 2024-04-18 (×4): 1000 mg via INTRAVENOUS
  Filled 2024-04-17 (×4): qty 100

## 2024-04-17 MED ORDER — CHLORHEXIDINE GLUCONATE CLOTH 2 % EX PADS
6.0000 | MEDICATED_PAD | Freq: Every day | CUTANEOUS | Status: DC
  Administered 2024-04-18 – 2024-05-05 (×17): 6 via TOPICAL

## 2024-04-17 MED ORDER — ONDANSETRON 4 MG PO TBDP: 4.0000 mg | ORAL_TABLET | Freq: Four times a day (QID) | ORAL | Status: DC | PRN

## 2024-04-17 MED ORDER — CALCIUM GLUCONATE-NACL 2-0.675 GM/100ML-% IV SOLN
2.0000 g | Freq: Once | INTRAVENOUS | Status: AC
  Administered 2024-04-17: 2000 mg via INTRAVENOUS
  Filled 2024-04-17: qty 100

## 2024-04-17 MED ORDER — SODIUM CHLORIDE 0.9 % IV SOLN: INTRAVENOUS | Status: AC

## 2024-04-17 MED ORDER — HYDRALAZINE HCL 20 MG/ML IJ SOLN
10.0000 mg | INTRAMUSCULAR | Status: DC | PRN
  Administered 2024-04-18 – 2024-04-20 (×7): 10 mg via INTRAVENOUS
  Filled 2024-04-17 (×7): qty 1

## 2024-04-17 MED ORDER — POLYETHYLENE GLYCOL 3350 17 G PO PACK: 17.0000 g | PACK | Freq: Every day | ORAL | Status: DC | PRN

## 2024-04-17 MED ORDER — SODIUM CHLORIDE 0.9% FLUSH: 10.0000 mL | INTRAVENOUS | Status: DC | PRN

## 2024-04-17 MED ORDER — NOREPINEPHRINE 4 MG/250ML-% IV SOLN
0.0000 ug/min | INTRAVENOUS | Status: DC
  Administered 2024-04-17: 15 ug/min via INTRAVENOUS
  Administered 2024-04-17: 5 ug/min via INTRAVENOUS
  Administered 2024-04-18: 12 ug/min via INTRAVENOUS
  Administered 2024-04-18: 15 ug/min via INTRAVENOUS
  Administered 2024-04-21: 6 ug/min via INTRAVENOUS
  Administered 2024-04-21: 35 ug/min via INTRAVENOUS
  Filled 2024-04-17 (×4): qty 250

## 2024-04-17 MED ORDER — FENTANYL CITRATE (PF) 50 MCG/ML IJ SOSY
25.0000 ug | PREFILLED_SYRINGE | Freq: Once | INTRAMUSCULAR | Status: AC
  Administered 2024-04-17: 25 ug via INTRAVENOUS
  Filled 2024-04-17: qty 1

## 2024-04-17 MED ORDER — ORAL CARE MOUTH RINSE: 15.0000 mL | OROMUCOSAL | Status: DC | PRN

## 2024-04-17 MED ORDER — METHOCARBAMOL 1000 MG/10ML IJ SOLN
500.0000 mg | Freq: Three times a day (TID) | INTRAMUSCULAR | Status: DC
  Administered 2024-04-17 – 2024-04-19 (×5): 500 mg via INTRAVENOUS
  Filled 2024-04-17 (×5): qty 10

## 2024-04-17 MED ORDER — LACTATED RINGERS IV BOLUS
500.0000 mL | Freq: Once | INTRAVENOUS | Status: AC
  Administered 2024-04-17: 500 mL via INTRAVENOUS

## 2024-04-17 MED ORDER — DOCUSATE SODIUM 100 MG PO CAPS: 100.0000 mg | ORAL_CAPSULE | Freq: Two times a day (BID) | ORAL | Status: DC

## 2024-04-17 MED ORDER — LORAZEPAM 2 MG/ML IJ SOLN
2.0000 mg | Freq: Once | INTRAMUSCULAR | Status: AC
  Administered 2024-04-17: 2 mg via INTRAVENOUS
  Filled 2024-04-17: qty 1

## 2024-04-17 MED ORDER — ONDANSETRON HCL 4 MG/2ML IJ SOLN: 4.0000 mg | Freq: Four times a day (QID) | INTRAMUSCULAR | Status: DC | PRN

## 2024-04-17 MED ORDER — LEVETIRACETAM (KEPPRA) 500 MG/5 ML ADULT IV PUSH
500.0000 mg | Freq: Two times a day (BID) | INTRAVENOUS | Status: AC
  Administered 2024-04-17 – 2024-04-24 (×14): 500 mg via INTRAVENOUS
  Filled 2024-04-17 (×7): qty 5

## 2024-04-17 MED ORDER — IOHEXOL 350 MG/ML SOLN
75.0000 mL | Freq: Once | INTRAVENOUS | Status: AC | PRN
  Administered 2024-04-17: 75 mL via INTRAVENOUS

## 2024-04-17 MED ORDER — METHOCARBAMOL 500 MG PO TABS
500.0000 mg | ORAL_TABLET | Freq: Three times a day (TID) | ORAL | Status: DC
  Filled 2024-04-17: qty 1

## 2024-04-17 MED ORDER — SODIUM CHLORIDE 0.9% IV SOLUTION: Freq: Once | INTRAVENOUS | Status: AC
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
## 2024-04-17 NOTE — Progress Notes (Signed)
 Peripherally Inserted Central Catheter Placement  The IV Nurse has discussed with the patient and/or persons authorized to consent for the patient, the purpose of this procedure and the potential benefits and risks involved with this procedure.  The benefits include less needle sticks, lab draws from the catheter, and the patient may be discharged home with the catheter. Risks include, but not limited to, infection, bleeding, blood clot (thrombus formation), and puncture of an artery; nerve damage and irregular heartbeat and possibility to perform a PICC exchange if needed/ordered by physician.  Alternatives to this procedure were also discussed.  Bard Power PICC patient education guide, fact sheet on infection prevention and patient information card has been provided to patient /or left at bedside.  Consent signed by wife at bedside due to altered mental status.   PICC Placement Documentation  PICC Triple Lumen 04/17/24 Right Brachial 40 cm 0 cm (Active)  Indication for Insertion or Continuance of Line Vasoactive infusions 04/17/24 2326  Exposed Catheter (cm) 0 cm 04/17/24 2326  Site Assessment Clean, Dry, Intact 04/17/24 2326  Lumen #1 Status Saline locked;Flushed;Blood return noted 04/17/24 2326  Lumen #2 Status Saline locked;Flushed;Blood return noted 04/17/24 2326  Lumen #3 Status Saline locked;Flushed;Blood return noted 04/17/24 2326  Dressing Type Transparent;Securing device 04/17/24 2326  Dressing Status Antimicrobial disc/dressing in place;Clean, Dry, Intact 04/17/24 2326  Line Care Connections checked and tightened 04/17/24 2326  Line Adjustment (NICU/IV Team Only) No 04/17/24 2326  Dressing Intervention New dressing;Adhesive placed at insertion site (IV team only) 04/17/24 2326  Dressing Change Due 04/24/24 04/17/24 2326       Tom Norton 04/17/2024, 11:27 PM

## 2024-04-17 NOTE — ED Provider Notes (Signed)
 " [facility] Provider Note   HPI/ROS    History obtained from EMS.  Tom Norton is a 89 y.o. male who presents for Fall and who  has a past medical history of BPH (benign prostatic hyperplasia), Cardiomegaly, Dilated aortic root, Dysrhythmia, Enlarged prostate without lower urinary tract symptoms (luts), GERD (gastroesophageal reflux disease), History of hiatal hernia, Hyperlipidemia, Hypertension, Hypothyroidism, Insomnia, Junctional bradycardia, OA (osteoarthritis), Obesity, Paroxysmal A-fib (HCC), Peripheral neuropathy (07/17/2017), and Vitamin D  deficiency.  Patient presents today via EMS after falling off his roof while cleaning leaves.  Fell off of approximately 10 feet.  When EMS got there he had a obvious right leg deformity and was GCS 10.  They gave him 25 mics of fentanyl  and 250 cc of normal saline and route.  States that he does take Eliquis .  On arrival he is GCS 10, hemodynamically stable, but not answering questions appropriately.  MDM   I have reviewed the nursing documentation, vital signs, as well as the past medical history, surgical history, family history, and social history.  Initial Assessment:  Patient hemodynamically stable on initial evaluation, and GCS is improving to now 12 or 13.  Will hold off on airway intervention at this time given improving GCS.  Concern more so for intracranial hemorrhage at this time.  Has obvious right femur deformity but has good pulses distally.  Will obtain full trauma pan scans.  Given volume in the trauma bay with fluids given mild hypotension on repeat blood pressure check. Disposition:  {ED Dispo:29898}    This patient was staffed with Dr. PIERRETTE who supervised the visit and agreed with the plan of care.   Due to the patients current presenting symptoms, physical exam findings, and the workup stated above, it is thought that the etiology of the patients current presentation is: No  diagnosis found.   Clinical Complexity A medically appropriate history, review of systems, and physical exam was performed.  Factors that affect the complexity of this encounter: {DATA REVIEWED HX MDM AMOUNT/COMPLEXITY LIMITED:20770::assessment of correct protocol,laboratory work from this visit,notes from other physicians (***),review of echocardiogram/EKG results}  My independent interpretations of diagnostic studies are documented in the ED course above.   If decision rules were used in this patient's evaluation, they are listed below.  *** Click here for ABCD2, HEART and other calculatorsREFRESH Note before signing   Patient's presentation is most consistent with {EM COPA:27473}  MDM generated using voice dictation software and may contain dictation errors. Please contact me for any clarification or with any questions.    Physical Exam, PMH, PSH, Family History, and Social Hsitory   Vitals:   04/17/24 1653  BP: 122/66  Physical Exam Constitutional Nursing notes reviewed Vital signs reviewed  Head No obvious trauma No skull depressions or lacerations  ENT PERRL No conjunctival hemorrhage No periorbital ecchymoses, Racoon Eyes, or Battle Sign bilaterally Ears atraumatic No nasal septal deviation or hematoma Mouth and tongue atraumatic Trachea midline.   Neck No C spine stepoffs, deformities, or tenderness ***C collar in place  Chest Clavicles atraumatic Clavicles stable to anterior compression without crepitus Chest wall with symmetric expansion Chest wall stable to anterior and lateral compression without crepitus  Respiratory Effort normal ***CTAB No respiratory distress  CV ***Normal rate DP and radial pulses 2+ and equal bilaterally  Abdomen Soft Non-tender Non-distended No peritonitis No abrasions/contusions  GU Atraumatic No gross blood  MSK Atraumatic No obvious deformity ROM appropriate Pelvis stable to lateral compression  Back T spine  non-tender L spine non-tender No step offs or deformities   Skin Warm Dry  Neuro Awake and alert Moving all extremities GCS ***     Past Medical History:  Diagnosis Date   BPH (benign prostatic hyperplasia)    Cardiomegaly    Dilated aortic root    a. 09/2013: 4.2cm by echo.   Dysrhythmia    Enlarged prostate without lower urinary tract symptoms (luts)    GERD (gastroesophageal reflux disease)    rare   History of hiatal hernia    Hyperlipidemia    Hypertension    Hypothyroidism    Insomnia    Junctional bradycardia    OA (osteoarthritis)    Obesity    Paroxysmal A-fib (HCC)    a. 09/23/13 a-fib with RVR and hypotensive, requiring emergent cardioversion in ED   Peripheral neuropathy 07/17/2017   Vitamin D  deficiency      Past Surgical History:  Procedure Laterality Date   CARDIOVERSION  2015   CARPAL TUNNEL RELEASE Left 2019   I & D EXTREMITY Left 02/18/2021   Procedure: IRRIGATION AND DEBRIDEMENT KNEE;  Surgeon: Harden Jerona GAILS, MD;  Location: [facility] OR;  Service: Orthopedics;  Laterality: Left;   INGUINAL HERNIA REPAIR Left 05/03/2019   Procedure: HERNIA REPAIR INGUINAL ADULT WITH MESH;  Surgeon: Eletha Boas, MD;  Location: [facility];  Service: General;  Laterality: Left;   INGUINAL HERNIA REPAIR Left 1980   INGUINAL HERNIA REPAIR Right 09/12/2021   Procedure: OPEN REPAIR RIGHT INGUINAL HERNIA WITH MESH;  Surgeon: Eletha Boas, MD;  Location: [facility];  Service: General;  Laterality: Right;   TOTAL KNEE ARTHROPLASTY Left 07/20/2017   Procedure: LEFT TOTAL KNEE ARTHROPLASTY;  Surgeon: Gerome Charleston, MD;  Location: [facility];  Service: Orthopedics;  Laterality: Left;  Adductor Block   TRANSTHORACIC ECHOCARDIOGRAM  09/23/2013   EF 55-60%; normal wall motion. Moderately dilated aortic root and ascending aorta; mild AI; moderate bi-atrial enlargement     Family History  Problem Relation Age of Onset   Heart disease Father    Stroke Brother    Heart disease Brother     Social  History   Tobacco Use   Smoking status: Never    Passive exposure: Never   Smokeless tobacco: Never  Substance Use Topics   Alcohol use: Yes    Alcohol/week: 10.0 standard drinks of alcohol    Types: 10 Glasses of wine per week    Comment: couple of glasses of wine a day most days     Procedures   If procedures were preformed on this patient, they are listed below:  Procedures   Electronically signed by:   Glendia Carlin Ancona, M.D. PGY-2, Emergency Medicine   Please note that this documentation was produced with the assistance of voice-to-text technology and may contain errors.  "

## 2024-04-17 NOTE — Progress Notes (Addendum)
 89 y/ on Eliquis  who apparently fell off of a roof. CT head shows various TBI hemorrhages. Got Kcentra . Also with various TP fractures and possible T9-10 discoligamentous injury  Recommendations: SBP<140 Repeat CT head in AM Follow-up CT reads Hold all anticoagulation and antiplatelets Patient not a candidate for hematoma evacuation surgery if his hemorrhages were to expand MRI-Tspine w/o contrast when able Bedrest, head of bed as tolerated from my standpoint

## 2024-04-17 NOTE — ED Notes (Signed)
 PMS intact with all extremities

## 2024-04-17 NOTE — Progress Notes (Signed)
 Transition of Care [facility] - CAGE-AID Screening   Patient Details  Name: Tom Norton MRN: [id number] Date of Birth: 1930-04-14  Transition of Care [facility]) CM/SW Contact:    Bernardino Mayotte, RN Phone Number: 04/17/2024, 7:20 PM   Clinical Narrative:  Occasionally consumes alcohol, no illicit substances. Resources not given at this time.  CAGE-AID Screening:    Have You Ever Felt You Ought to Cut Down on Your Drinking or Drug Use?: No Have People Annoyed You By Critizing Your Drinking Or Drug Use?: No Have You Felt Bad Or Guilty About Your Drinking Or Drug Use?: No Have You Ever Had a Drink or Used Drugs First Thing In The Morning to Steady Your Nerves or to Get Rid of a Hangover?: No CAGE-AID Score: 0  Substance Abuse Education Offered: No

## 2024-04-17 NOTE — Progress Notes (Signed)
 Consult received from EDP.  I have reviewed the pertinent imaging which shows a right femur fracture with extension to the subtroch.  I have reached out to Dr. Kendal and he has graciously agreed to provide surgical treatment of the femur fracture tomorrow pending clearance.    GEANNIE Ozell Cummins, MD [facility] 6:41 PM

## 2024-04-17 NOTE — ED Notes (Signed)
 Trauma Response Nurse Documentation  Tom Norton is a 89 y.o. male arriving to [facility] ED via EMS  On Eliquis  (apixaban ) daily. Trauma was activated as a Level 2 based on the following trauma criteria GCS 10-14 associated with trauma or AVPU < A.  Patient cleared for CT by Dr. Dreama. Pt transported to CT with trauma response nurse present to monitor. RN remained with the patient throughout their absence from the department for clinical observation. GCS 10.  Trauma MD Arrival Time: 1750.  History   Past Medical History:  Diagnosis Date   BPH (benign prostatic hyperplasia)    Cardiomegaly    Dilated aortic root    a. 09/2013: 4.2cm by echo.   Dysrhythmia    Enlarged prostate without lower urinary tract symptoms (luts)    GERD (gastroesophageal reflux disease)    rare   History of hiatal hernia    Hyperlipidemia    Hypertension    Hypothyroidism    Insomnia    Junctional bradycardia    OA (osteoarthritis)    Obesity    Paroxysmal A-fib (HCC)    a. 09/23/13 a-fib with RVR and hypotensive, requiring emergent cardioversion in ED   Peripheral neuropathy 07/17/2017   Vitamin D  deficiency      Past Surgical History:  Procedure Laterality Date   CARDIOVERSION  2015   CARPAL TUNNEL RELEASE Left 2019   I & D EXTREMITY Left 02/18/2021   Procedure: IRRIGATION AND DEBRIDEMENT KNEE;  Surgeon: Harden Jerona GAILS, MD;  Location: [facility] OR;  Service: Orthopedics;  Laterality: Left;   INGUINAL HERNIA REPAIR Left 05/03/2019   Procedure: HERNIA REPAIR INGUINAL ADULT WITH MESH;  Surgeon: Eletha Boas, MD;  Location: [facility];  Service: General;  Laterality: Left;   INGUINAL HERNIA REPAIR Left 1980   INGUINAL HERNIA REPAIR Right 09/12/2021   Procedure: OPEN REPAIR RIGHT INGUINAL HERNIA WITH MESH;  Surgeon: Eletha Boas, MD;  Location: [facility];  Service: General;  Laterality: Right;   TOTAL KNEE ARTHROPLASTY Left 07/20/2017   Procedure: LEFT TOTAL KNEE ARTHROPLASTY;  Surgeon: Gerome Charleston, MD;   Location: [facility];  Service: Orthopedics;  Laterality: Left;  Adductor Block   TRANSTHORACIC ECHOCARDIOGRAM  09/23/2013   EF 55-60%; normal wall motion. Moderately dilated aortic root and ascending aorta; mild AI; moderate bi-atrial enlargement     Initial Focused Assessment (If applicable, or please see trauma documentation): Patient alert but disoriented, will follow commands but speech is garbled when responding, was able to say name Airway intact, bilateral breath sounds Pulses 2+ Large hematoma to back of head Obvious R femur deformity  CT's Completed:   CT Head, CT C-Spine, CT Chest w/ contrast, and CT abdomen/pelvis w/ contrast   Interventions:  IV, labs CXR/PXR CT Head/Cspine/C/A/P R femur XR 1L NS Kcentra  for reversal Keppra  2U Whole blood for continued hypotension Levophed  25mcg Fent  Plan for disposition:  Admission to ICU   Consults completed:  Orthopaedic Surgeon and Neurosurgeon at see charting, paged by EDP.  Event Summary: Patient to ED after falling from a foot approximately 10 ft while blowing leaves. Patient arrived with minimal speech, hard of hearing, c-collar in place, obvious RLE deformity. Imaging was ordered and revealed SAH/SDH, rib fxs, T/L spine fxs, R femur fx. Patients Eliquis  reversed with Kcentra , keppra  given per NSG. 1L NS bolus given for hypotension. Due to continued hypotension, 2U Whole Blood per Dr Ann, levophed  for possible spinal shock d/t spinal fxs. 10lbs [employer] traction placed by ortho tech. Plan  for ICU admission, repeat head scan overnight. Patients wife and son at bedside both updated on plan of care.   Bedside handoff with Inpatient RN Jeannette Motto.    Alan CROME Don Tiu  Trauma Response RN  Please call TRN at [phone] for further assistance.

## 2024-04-17 NOTE — ED Triage Notes (Addendum)
 Pt coming from home, pt was clearing leaves off roof and fell off 46ft. GCS 10. Angulated fx of right leg. Laceration to back of head, bleeding controlled with pressure. Pupils equal and reactive. Pt stating repetitive statements. Pt is confused on arrival. EMS gave 25mcgs of fentanyl . BOP 100 systolic. Pt arrives in c-collar. Pt on eliquis .

## 2024-04-17 NOTE — H&P (Signed)
 "   HPI  Tom Norton is an 89 y.o. male who presents as a level 2 trauma s/p fall.  Per wife, patient was on upper floor balcony cleaning leaves off of the roof when he fell and the balcony railing gave way and he fell from the second floor balcony to the first floor, approximately 10 feet.  On EMS arrival patient had an obvious right leg deformity and GCS of 10.  Patient takes Eliquis  for A-fib, however wife states that she does not think he has A-fib anymore.  Patient initially was GCS 10 per EMS, he is GCS 11 on my exam. Placed in bucks traction in ED.  10 point review of systems is negative except as listed above in HPI.  Objective  Past Medical History: Past Medical History:  Diagnosis Date   BPH (benign prostatic hyperplasia)    Cardiomegaly    Dilated aortic root    a. 09/2013: 4.2cm by echo.   Dysrhythmia    Enlarged prostate without lower urinary tract symptoms (luts)    GERD (gastroesophageal reflux disease)    rare   History of hiatal hernia    Hyperlipidemia    Hypertension    Hypothyroidism    Insomnia    Junctional bradycardia    OA (osteoarthritis)    Obesity    Paroxysmal A-fib (HCC)    a. 09/23/13 a-fib with RVR and hypotensive, requiring emergent cardioversion in ED   Peripheral neuropathy 07/17/2017   Vitamin D  deficiency     Past Surgical History: Past Surgical History:  Procedure Laterality Date   CARDIOVERSION  2015   CARPAL TUNNEL RELEASE Left 2019   I & D EXTREMITY Left 02/18/2021   Procedure: IRRIGATION AND DEBRIDEMENT KNEE;  Surgeon: Harden Jerona GAILS, MD;  Location: [facility] OR;  Service: Orthopedics;  Laterality: Left;   INGUINAL HERNIA REPAIR Left 05/03/2019   Procedure: HERNIA REPAIR INGUINAL ADULT WITH MESH;  Surgeon: Eletha Boas, MD;  Location: [facility];  Service: General;  Laterality: Left;   INGUINAL HERNIA REPAIR Left 1980   INGUINAL HERNIA REPAIR Right 09/12/2021   Procedure: OPEN REPAIR RIGHT INGUINAL HERNIA WITH MESH;  Surgeon: Eletha Boas,  MD;  Location: [facility];  Service: General;  Laterality: Right;   TOTAL KNEE ARTHROPLASTY Left 07/20/2017   Procedure: LEFT TOTAL KNEE ARTHROPLASTY;  Surgeon: Gerome Charleston, MD;  Location: [facility];  Service: Orthopedics;  Laterality: Left;  Adductor Block   TRANSTHORACIC ECHOCARDIOGRAM  09/23/2013   EF 55-60%; normal wall motion. Moderately dilated aortic root and ascending aorta; mild AI; moderate bi-atrial enlargement    Family History:  Family History  Problem Relation Age of Onset   Heart disease Father    Stroke Brother    Heart disease Brother     Social History:  reports that he has never smoked. He has never been exposed to tobacco smoke. He has never used smokeless tobacco. He reports current alcohol use of about 10.0 standard drinks of alcohol per week. He reports that he does not use drugs.  Allergies: Allergies[1]  Medications: I have reviewed the patient's current medications.  Labs: I have personally reviewed all labs for the past 24h  Imaging: I have personally reviewed and interpreted all imaging for the past 24h and agree with the radiologist's impression.  CT CHEST ABDOMEN PELVIS W CONTRAST Result Date: 04/17/2024 CLINICAL DATA:  Blunt trauma.  Fall from roof. EXAM: CT CHEST, ABDOMEN, AND PELVIS WITH CONTRAST CT THORACIC SPINE WITHOUT CONTRAST CT LUMBAR SPINE  WITHOUT CONTRAST. TECHNIQUE: Multidetector CT imaging of the chest, abdomen and pelvis was performed following the standard protocol during bolus administration of intravenous contrast. Technique: Multiplanar CT images of the THORACIC AND lumbar spine were reconstructed from contemporary CT of the Abdomen and Pelvis. RADIATION DOSE REDUCTION: This exam was performed according to the departmental dose-optimization program which includes automated exposure control, adjustment of the mA and/or kV according to patient size and/or use of iterative reconstruction technique. CONTRAST:  75mL OMNIPAQUE  IOHEXOL  350 MG/ML SOLN  COMPARISON:  Abdominopelvic CT 04/30/2019 FINDINGS: CT CHEST FINDINGS Cardiovascular: No evidence of acute aortic or vascular injury. The ascending aorta is dilated at 4.4 cm. Aortic atherosclerosis and tortuosity. The heart is enlarged. There are coronary artery calcifications. No pericardial effusion. Mediastinum/Nodes: No mediastinal hemorrhage or hematoma. No mediastinal or hilar adenopathy. Patulous esophagus. No pneumomediastinum. Lungs/Pleura: Trace left inferior pneumothorax measuring 10 mm, series 17, image 114. No right-sided pneumothorax. Hypoventilatory changes dependently without evidence of pulmonary contusion. No significant pleural effusion. The trachea and central airways are clear. 3 mm right middle lobe pulmonary nodule, series 17, image 72. Musculoskeletal: Right rib fractures 3 through 8, with the 6 and seventh fractures being displaced. There are displaced fractures of left posterior eighth through twelfth ribs. There are buckle fractures of left anterior third through sixth ribs, with a displaced anterior left seventh rib fracture. Please reference thoracic spine findings reported below. No acute fracture of the sternum, included clavicles or shoulder girdles. There is no confluent chest wall contusion or soft tissue edema CT ABDOMEN PELVIS FINDINGS Hepatobiliary: No focal liver abnormality is seen. Status post cholecystectomy. No biliary dilatation. Pancreas: No evidence of injury. No ductal dilatation or inflammation. Spleen: No splenic injury or perisplenic hematoma. Adrenals/Urinary Tract: No adrenal hemorrhage or renal injury identified. 13 mm nonobstructing stone in the lower right kidney. Left renal cysts. No further follow-up imaging is recommended. No bladder wall thickening or evidence of injury. Stellate 3 cm stone in the left aspect of the urinary bladder. Stomach/Bowel: No evidence of bowel injury or mesenteric hematoma. Diffuse colonic diverticulosis without diverticulitis.  Moderate distention of the stomach with ingested material. Vascular/Lymphatic: No evidence of aortic or vascular injury smooth contours of the abdominal aorta and IVC. No suspicious lymphadenopathy. Reproductive: Enlarged prostate causing mass effect on the bladder base. Other: No free intra-abdominal air.  No abdominal ascites. Musculoskeletal: Reference lumbar spine findings below. Right proximal femur fracture is primarily involving the femoral shaft and subtrochanteric femur. There is no convincing greater trochanteric involvement as seen on radiograph. There is no widening of the right sacroiliac joint. No acute fracture of the pelvis. CT THORACIC SPINE FINDINGS Alignment: Broad-based scoliotic curvature and straightening of normal lordosis. Slight flexion at the T9-T10 disc space with disruption of flowing anterior osteophytes in widening of the anterior aspect of the disc space. Vertebrae: Lucency through flowing anterior osteophytes at the T9-T10 disc space with associated disc space widening suspicious for ligamentous injury. Slight T3 superior endplate compression deformity has a chronic appearance. Minimally displaced fracture involving the T12 left transverse process. Paraspinal and other soft tissues: Anterior paraspinal stranding at the T9-T10 level. Anterior paraspinal stranding at the T11 level. Disc levels: Flowing anterior osteophytes throughout the mid lower thoracic spine. No high-grade canal stenosis. CT LUMBAR SPINE FINDINGS Segmentation: 5 lumbar type vertebrae. Alignment: Levoscoliosis. Grade 1 anterolisthesis of L4 on L5 is likely degenerative. Vertebrae: Displaced left transverse process fractures L1 through L4. No fracture involving the anterior or middle columns. Paraspinal and other  soft tissues: Heterogeneous enlargement of the left psoas as well as posterior paraspinal muscle adjacent to displaced transverse process fractures consistent with hemorrhage. No active extravasation. Disc  levels: Diffuse degenerative disc disease and facet hypertrophy. IMPRESSION: 1. Trace left inferior pneumothorax measuring 10 mm. 2. Right rib fractures 3 through 8, with the 6 and seventh fractures being displaced. Displaced fractures of left posterior eighth through twelfth ribs. Buckle fractures of left anterior third through sixth ribs, with a displaced anterior left seventh rib fracture. 3. Lucency through flowing anterior osteophytes at the T9-T10 disc space with associated disc space widening and anterior paraspinal stranding suspicious for ligamentous injury. Recommend further assessment with MRI. 4. Minimally displaced fracture involving the T12 left transverse process. Displaced left transverse process fractures L1 through L4. 5. Heterogeneous enlargement of the left psoas as well as posterior paraspinal muscle adjacent to displaced transverse process fractures consistent with hemorrhage. No active extravasation. 6. Right proximal femur fracture is primarily involving the femoral shaft and subtrochanteric femur. There is no convincing greater trochanteric involvement as seen on radiograph. 7. No evidence of solid organ injury in the abdomen or pelvis. Aortic Atherosclerosis (ICD10-I70.0). Critical Value/emergent results were called by telephone at the time of interpretation on 04/17/2024 at 6:16 pm to provider [facility] , who verbally acknowledged these results. Electronically Signed   By: Andrea Gasman M.D.   On: 04/17/2024 18:22   CT T-SPINE NO CHARGE Result Date: 04/17/2024 CLINICAL DATA:  Blunt trauma.  Fall from roof. EXAM: CT CHEST, ABDOMEN, AND PELVIS WITH CONTRAST CT THORACIC SPINE WITHOUT CONTRAST CT LUMBAR SPINE WITHOUT CONTRAST. TECHNIQUE: Multidetector CT imaging of the chest, abdomen and pelvis was performed following the standard protocol during bolus administration of intravenous contrast. Technique: Multiplanar CT images of the THORACIC AND lumbar spine were reconstructed from  contemporary CT of the Abdomen and Pelvis. RADIATION DOSE REDUCTION: This exam was performed according to the departmental dose-optimization program which includes automated exposure control, adjustment of the mA and/or kV according to patient size and/or use of iterative reconstruction technique. CONTRAST:  75mL OMNIPAQUE  IOHEXOL  350 MG/ML SOLN COMPARISON:  Abdominopelvic CT 04/30/2019 FINDINGS: CT CHEST FINDINGS Cardiovascular: No evidence of acute aortic or vascular injury. The ascending aorta is dilated at 4.4 cm. Aortic atherosclerosis and tortuosity. The heart is enlarged. There are coronary artery calcifications. No pericardial effusion. Mediastinum/Nodes: No mediastinal hemorrhage or hematoma. No mediastinal or hilar adenopathy. Patulous esophagus. No pneumomediastinum. Lungs/Pleura: Trace left inferior pneumothorax measuring 10 mm, series 17, image 114. No right-sided pneumothorax. Hypoventilatory changes dependently without evidence of pulmonary contusion. No significant pleural effusion. The trachea and central airways are clear. 3 mm right middle lobe pulmonary nodule, series 17, image 72. Musculoskeletal: Right rib fractures 3 through 8, with the 6 and seventh fractures being displaced. There are displaced fractures of left posterior eighth through twelfth ribs. There are buckle fractures of left anterior third through sixth ribs, with a displaced anterior left seventh rib fracture. Please reference thoracic spine findings reported below. No acute fracture of the sternum, included clavicles or shoulder girdles. There is no confluent chest wall contusion or soft tissue edema CT ABDOMEN PELVIS FINDINGS Hepatobiliary: No focal liver abnormality is seen. Status post cholecystectomy. No biliary dilatation. Pancreas: No evidence of injury. No ductal dilatation or inflammation. Spleen: No splenic injury or perisplenic hematoma. Adrenals/Urinary Tract: No adrenal hemorrhage or renal injury identified. 13 mm  nonobstructing stone in the lower right kidney. Left renal cysts. No further follow-up imaging is recommended. No bladder  wall thickening or evidence of injury. Stellate 3 cm stone in the left aspect of the urinary bladder. Stomach/Bowel: No evidence of bowel injury or mesenteric hematoma. Diffuse colonic diverticulosis without diverticulitis. Moderate distention of the stomach with ingested material. Vascular/Lymphatic: No evidence of aortic or vascular injury smooth contours of the abdominal aorta and IVC. No suspicious lymphadenopathy. Reproductive: Enlarged prostate causing mass effect on the bladder base. Other: No free intra-abdominal air.  No abdominal ascites. Musculoskeletal: Reference lumbar spine findings below. Right proximal femur fracture is primarily involving the femoral shaft and subtrochanteric femur. There is no convincing greater trochanteric involvement as seen on radiograph. There is no widening of the right sacroiliac joint. No acute fracture of the pelvis. CT THORACIC SPINE FINDINGS Alignment: Broad-based scoliotic curvature and straightening of normal lordosis. Slight flexion at the T9-T10 disc space with disruption of flowing anterior osteophytes in widening of the anterior aspect of the disc space. Vertebrae: Lucency through flowing anterior osteophytes at the T9-T10 disc space with associated disc space widening suspicious for ligamentous injury. Slight T3 superior endplate compression deformity has a chronic appearance. Minimally displaced fracture involving the T12 left transverse process. Paraspinal and other soft tissues: Anterior paraspinal stranding at the T9-T10 level. Anterior paraspinal stranding at the T11 level. Disc levels: Flowing anterior osteophytes throughout the mid lower thoracic spine. No high-grade canal stenosis. CT LUMBAR SPINE FINDINGS Segmentation: 5 lumbar type vertebrae. Alignment: Levoscoliosis. Grade 1 anterolisthesis of L4 on L5 is likely degenerative.  Vertebrae: Displaced left transverse process fractures L1 through L4. No fracture involving the anterior or middle columns. Paraspinal and other soft tissues: Heterogeneous enlargement of the left psoas as well as posterior paraspinal muscle adjacent to displaced transverse process fractures consistent with hemorrhage. No active extravasation. Disc levels: Diffuse degenerative disc disease and facet hypertrophy. IMPRESSION: 1. Trace left inferior pneumothorax measuring 10 mm. 2. Right rib fractures 3 through 8, with the 6 and seventh fractures being displaced. Displaced fractures of left posterior eighth through twelfth ribs. Buckle fractures of left anterior third through sixth ribs, with a displaced anterior left seventh rib fracture. 3. Lucency through flowing anterior osteophytes at the T9-T10 disc space with associated disc space widening and anterior paraspinal stranding suspicious for ligamentous injury. Recommend further assessment with MRI. 4. Minimally displaced fracture involving the T12 left transverse process. Displaced left transverse process fractures L1 through L4. 5. Heterogeneous enlargement of the left psoas as well as posterior paraspinal muscle adjacent to displaced transverse process fractures consistent with hemorrhage. No active extravasation. 6. Right proximal femur fracture is primarily involving the femoral shaft and subtrochanteric femur. There is no convincing greater trochanteric involvement as seen on radiograph. 7. No evidence of solid organ injury in the abdomen or pelvis. Aortic Atherosclerosis (ICD10-I70.0). Critical Value/emergent results were called by telephone at the time of interpretation on 04/17/2024 at 6:16 pm to provider [facility] , who verbally acknowledged these results. Electronically Signed   By: Andrea Gasman M.D.   On: 04/17/2024 18:22   CT L-SPINE NO CHARGE Result Date: 04/17/2024 CLINICAL DATA:  Blunt trauma.  Fall from roof. EXAM: CT CHEST, ABDOMEN,  AND PELVIS WITH CONTRAST CT THORACIC SPINE WITHOUT CONTRAST CT LUMBAR SPINE WITHOUT CONTRAST. TECHNIQUE: Multidetector CT imaging of the chest, abdomen and pelvis was performed following the standard protocol during bolus administration of intravenous contrast. Technique: Multiplanar CT images of the THORACIC AND lumbar spine were reconstructed from contemporary CT of the Abdomen and Pelvis. RADIATION DOSE REDUCTION: This exam was performed according to  the departmental dose-optimization program which includes automated exposure control, adjustment of the mA and/or kV according to patient size and/or use of iterative reconstruction technique. CONTRAST:  75mL OMNIPAQUE  IOHEXOL  350 MG/ML SOLN COMPARISON:  Abdominopelvic CT 04/30/2019 FINDINGS: CT CHEST FINDINGS Cardiovascular: No evidence of acute aortic or vascular injury. The ascending aorta is dilated at 4.4 cm. Aortic atherosclerosis and tortuosity. The heart is enlarged. There are coronary artery calcifications. No pericardial effusion. Mediastinum/Nodes: No mediastinal hemorrhage or hematoma. No mediastinal or hilar adenopathy. Patulous esophagus. No pneumomediastinum. Lungs/Pleura: Trace left inferior pneumothorax measuring 10 mm, series 17, image 114. No right-sided pneumothorax. Hypoventilatory changes dependently without evidence of pulmonary contusion. No significant pleural effusion. The trachea and central airways are clear. 3 mm right middle lobe pulmonary nodule, series 17, image 72. Musculoskeletal: Right rib fractures 3 through 8, with the 6 and seventh fractures being displaced. There are displaced fractures of left posterior eighth through twelfth ribs. There are buckle fractures of left anterior third through sixth ribs, with a displaced anterior left seventh rib fracture. Please reference thoracic spine findings reported below. No acute fracture of the sternum, included clavicles or shoulder girdles. There is no confluent chest wall contusion or  soft tissue edema CT ABDOMEN PELVIS FINDINGS Hepatobiliary: No focal liver abnormality is seen. Status post cholecystectomy. No biliary dilatation. Pancreas: No evidence of injury. No ductal dilatation or inflammation. Spleen: No splenic injury or perisplenic hematoma. Adrenals/Urinary Tract: No adrenal hemorrhage or renal injury identified. 13 mm nonobstructing stone in the lower right kidney. Left renal cysts. No further follow-up imaging is recommended. No bladder wall thickening or evidence of injury. Stellate 3 cm stone in the left aspect of the urinary bladder. Stomach/Bowel: No evidence of bowel injury or mesenteric hematoma. Diffuse colonic diverticulosis without diverticulitis. Moderate distention of the stomach with ingested material. Vascular/Lymphatic: No evidence of aortic or vascular injury smooth contours of the abdominal aorta and IVC. No suspicious lymphadenopathy. Reproductive: Enlarged prostate causing mass effect on the bladder base. Other: No free intra-abdominal air.  No abdominal ascites. Musculoskeletal: Reference lumbar spine findings below. Right proximal femur fracture is primarily involving the femoral shaft and subtrochanteric femur. There is no convincing greater trochanteric involvement as seen on radiograph. There is no widening of the right sacroiliac joint. No acute fracture of the pelvis. CT THORACIC SPINE FINDINGS Alignment: Broad-based scoliotic curvature and straightening of normal lordosis. Slight flexion at the T9-T10 disc space with disruption of flowing anterior osteophytes in widening of the anterior aspect of the disc space. Vertebrae: Lucency through flowing anterior osteophytes at the T9-T10 disc space with associated disc space widening suspicious for ligamentous injury. Slight T3 superior endplate compression deformity has a chronic appearance. Minimally displaced fracture involving the T12 left transverse process. Paraspinal and other soft tissues: Anterior paraspinal  stranding at the T9-T10 level. Anterior paraspinal stranding at the T11 level. Disc levels: Flowing anterior osteophytes throughout the mid lower thoracic spine. No high-grade canal stenosis. CT LUMBAR SPINE FINDINGS Segmentation: 5 lumbar type vertebrae. Alignment: Levoscoliosis. Grade 1 anterolisthesis of L4 on L5 is likely degenerative. Vertebrae: Displaced left transverse process fractures L1 through L4. No fracture involving the anterior or middle columns. Paraspinal and other soft tissues: Heterogeneous enlargement of the left psoas as well as posterior paraspinal muscle adjacent to displaced transverse process fractures consistent with hemorrhage. No active extravasation. Disc levels: Diffuse degenerative disc disease and facet hypertrophy. IMPRESSION: 1. Trace left inferior pneumothorax measuring 10 mm. 2. Right rib fractures 3 through 8, with the  6 and seventh fractures being displaced. Displaced fractures of left posterior eighth through twelfth ribs. Buckle fractures of left anterior third through sixth ribs, with a displaced anterior left seventh rib fracture. 3. Lucency through flowing anterior osteophytes at the T9-T10 disc space with associated disc space widening and anterior paraspinal stranding suspicious for ligamentous injury. Recommend further assessment with MRI. 4. Minimally displaced fracture involving the T12 left transverse process. Displaced left transverse process fractures L1 through L4. 5. Heterogeneous enlargement of the left psoas as well as posterior paraspinal muscle adjacent to displaced transverse process fractures consistent with hemorrhage. No active extravasation. 6. Right proximal femur fracture is primarily involving the femoral shaft and subtrochanteric femur. There is no convincing greater trochanteric involvement as seen on radiograph. 7. No evidence of solid organ injury in the abdomen or pelvis. Aortic Atherosclerosis (ICD10-I70.0). Critical Value/emergent results were  called by telephone at the time of interpretation on 04/17/2024 at 6:16 pm to provider [facility] , who verbally acknowledged these results. Electronically Signed   By: Andrea Gasman M.D.   On: 04/17/2024 18:22   CT HEAD WO CONTRAST Result Date: 04/17/2024 CLINICAL DATA:  Poly trauma EXAM: CT HEAD WITHOUT CONTRAST CT CERVICAL SPINE WITHOUT CONTRAST TECHNIQUE: Multidetector CT imaging of the head and cervical spine was performed following the standard protocol without intravenous contrast. Multiplanar CT image reconstructions of the cervical spine were also generated. RADIATION DOSE REDUCTION: This exam was performed according to the departmental dose-optimization program which includes automated exposure control, adjustment of the mA and/or kV according to patient size and/or use of iterative reconstruction technique. COMPARISON:  CT brain and cervical spine 08/09/2022 FINDINGS: CT HEAD FINDINGS Brain: No acute territorial infarction or intracranial mass. Multifocal bilateral hemorrhage is noted. Left posterior convexity acute subdural hematoma measuring up to 9 mm maximum thickness, axial series 4, image 25 and coronal series 8, image 55. Small volume left posterior parafalcine subdural hematoma, measures up to 5 mm maximum thickness series 4, image 25. Focal extra-axial right parafalcine hematoma, series 4, image 26, possibly subarachnoid. Trace volume subarachnoid hemorrhage anterior to this. Additional small scattered foci of subarachnoid hemorrhage at the right vertex, left posterior parietal lobe and at the left temporal lobe. Small amount of subarachnoid hemorrhage at the left temporal occipital junction, series 4, image 13. Suspect also trace volume of left tentorial subdural blood, sagittal series 10, image 47 and coronal series 8, image 45, measuring up to 3 mm thickness. Atrophy and mild chronic small vessel ischemic changes of the white matter. The ventricles are nonenlarged. Vascular: No  hyperdense vessels.  Carotid vascular calcification. Skull: Acute nondepressed linear skull fracture involving the left parietooccipital bone, extends to the posterior aspect of the sagittal suture. Sinuses/Orbits: No acute finding. Other: Moderate left greater than right posterior scalp hematoma Traumatic Brain Injury Risk Stratification Skull Fracture: Nondisplaced - Moderate/mBIG 2 Subdural Hematoma (SDH): 8mm plus - High/mBIG 3 Subarachnoid Hemorrhage [facility]): multifocal, bilateral - High/mBIG 3 Epidural Hematoma (EDH): No - Low/mBIG 1 Cerebral contusion, intra-axial, intraparenchymal Hemorrhage (IPH): No Intraventricular Hemorrhage (IVH): No - Low/mBIG 1 Midline Shift > 1mm or Edema/effacement of sulci/vents: No - Low/mBIG 1 ---------------------------------------------------- CT CERVICAL SPINE FINDINGS Alignment: Stable cervical alignment with trace anterolisthesis C2 on C3, C3 on C4, C4 on C5. Facet alignment is maintained. Skull base and vertebrae: No acute fracture. No primary bone lesion or focal pathologic process. Soft tissues and spinal canal: No prevertebral fluid or swelling. No visible canal hematoma. Disc levels: Multilevel degenerative change. Advanced disc  space narrowing C5-C6 and C6-C7. No high-grade bony canal stenosis. Multilevel facet degenerative change with foraminal narrowing Upper chest: See separately dictated chest CT. Suspicion of the trace left apical pneumothorax. Other: None IMPRESSION: 1. Acute left posterior convexity subdural hematoma measuring up to 9 mm maximum thickness. Negative for midline shift. Small volume left posterior parafalcine subdural hematoma measuring up to 5 mm maximum thickness and suspect also trace volume of left tentorial subdural blood measuring up to 3 mm thickness. 2. Multifocal bilateral subarachnoid hemorrhage as detailed above. 3. Acute nondepressed linear skull fracture involving the left parietooccipital bone. Moderate left greater than right posterior  scalp hematoma. 4. No CT evidence for acute osseous abnormality of the cervical spine. 5. Suspicion of the trace left apical pneumothorax. Critical Value/emergent results were called by telephone at the time of interpretation on 04/17/2024 at 6:00 pm to provider [facility] , who verbally acknowledged these results. Electronically Signed   By: Luke Bun M.D.   On: 04/17/2024 18:01   CT CERVICAL SPINE WO CONTRAST Result Date: 04/17/2024 CLINICAL DATA:  Poly trauma EXAM: CT HEAD WITHOUT CONTRAST CT CERVICAL SPINE WITHOUT CONTRAST TECHNIQUE: Multidetector CT imaging of the head and cervical spine was performed following the standard protocol without intravenous contrast. Multiplanar CT image reconstructions of the cervical spine were also generated. RADIATION DOSE REDUCTION: This exam was performed according to the departmental dose-optimization program which includes automated exposure control, adjustment of the mA and/or kV according to patient size and/or use of iterative reconstruction technique. COMPARISON:  CT brain and cervical spine 08/09/2022 FINDINGS: CT HEAD FINDINGS Brain: No acute territorial infarction or intracranial mass. Multifocal bilateral hemorrhage is noted. Left posterior convexity acute subdural hematoma measuring up to 9 mm maximum thickness, axial series 4, image 25 and coronal series 8, image 55. Small volume left posterior parafalcine subdural hematoma, measures up to 5 mm maximum thickness series 4, image 25. Focal extra-axial right parafalcine hematoma, series 4, image 26, possibly subarachnoid. Trace volume subarachnoid hemorrhage anterior to this. Additional small scattered foci of subarachnoid hemorrhage at the right vertex, left posterior parietal lobe and at the left temporal lobe. Small amount of subarachnoid hemorrhage at the left temporal occipital junction, series 4, image 13. Suspect also trace volume of left tentorial subdural blood, sagittal series 10, image 47 and  coronal series 8, image 45, measuring up to 3 mm thickness. Atrophy and mild chronic small vessel ischemic changes of the white matter. The ventricles are nonenlarged. Vascular: No hyperdense vessels.  Carotid vascular calcification. Skull: Acute nondepressed linear skull fracture involving the left parietooccipital bone, extends to the posterior aspect of the sagittal suture. Sinuses/Orbits: No acute finding. Other: Moderate left greater than right posterior scalp hematoma Traumatic Brain Injury Risk Stratification Skull Fracture: Nondisplaced - Moderate/mBIG 2 Subdural Hematoma (SDH): 8mm plus - High/mBIG 3 Subarachnoid Hemorrhage [facility]): multifocal, bilateral - High/mBIG 3 Epidural Hematoma (EDH): No - Low/mBIG 1 Cerebral contusion, intra-axial, intraparenchymal Hemorrhage (IPH): No Intraventricular Hemorrhage (IVH): No - Low/mBIG 1 Midline Shift > 1mm or Edema/effacement of sulci/vents: No - Low/mBIG 1 ---------------------------------------------------- CT CERVICAL SPINE FINDINGS Alignment: Stable cervical alignment with trace anterolisthesis C2 on C3, C3 on C4, C4 on C5. Facet alignment is maintained. Skull base and vertebrae: No acute fracture. No primary bone lesion or focal pathologic process. Soft tissues and spinal canal: No prevertebral fluid or swelling. No visible canal hematoma. Disc levels: Multilevel degenerative change. Advanced disc space narrowing C5-C6 and C6-C7. No high-grade bony canal stenosis. Multilevel facet degenerative change  with foraminal narrowing Upper chest: See separately dictated chest CT. Suspicion of the trace left apical pneumothorax. Other: None IMPRESSION: 1. Acute left posterior convexity subdural hematoma measuring up to 9 mm maximum thickness. Negative for midline shift. Small volume left posterior parafalcine subdural hematoma measuring up to 5 mm maximum thickness and suspect also trace volume of left tentorial subdural blood measuring up to 3 mm thickness. 2. Multifocal  bilateral subarachnoid hemorrhage as detailed above. 3. Acute nondepressed linear skull fracture involving the left parietooccipital bone. Moderate left greater than right posterior scalp hematoma. 4. No CT evidence for acute osseous abnormality of the cervical spine. 5. Suspicion of the trace left apical pneumothorax. Critical Value/emergent results were called by telephone at the time of interpretation on 04/17/2024 at 6:00 pm to provider [facility] , who verbally acknowledged these results. Electronically Signed   By: Luke Bun M.D.   On: 04/17/2024 18:01   DG Pelvis Portable Result Date: 04/17/2024 CLINICAL DATA:  Trauma, fall from roof. EXAM: RIGHT FEMUR PORTABLE 1 VIEW; PORTABLE PELVIS 1-2 VIEWS COMPARISON:  Pelvis radiograph 05/11/2023 FINDINGS: Pelvis: Questionable widening of the right sacroiliac joint. There are lucencies projecting over the right iliac bone, external artifact versus nondisplaced fractures. Pubic rami are intact. Pubic symphysis is congruent. Bilateral hip joint space narrowing without hip dislocation. Stellate stone in the pelvis typical of bladder stone is again seen. Right femur: Frontal views of the right femur obtained. Displaced proximal femoral shaft fracture. There is a nondisplaced fracture involving the greater trochanter. Distal femur is intact. No knee dislocation. IMPRESSION: 1. Displaced proximal femoral shaft fracture. A nondisplaced fracture component extends proximally and involves the greater trochanter. 2. Questionable widening of the right sacroiliac joint. 3. Lucencies projecting over the right iliac bone, external artifact versus nondisplaced fractures. Electronically Signed   By: Andrea Gasman M.D.   On: 04/17/2024 17:20   DG FEMUR PORT, 1V RIGHT Result Date: 04/17/2024 CLINICAL DATA:  Trauma, fall from roof. EXAM: RIGHT FEMUR PORTABLE 1 VIEW; PORTABLE PELVIS 1-2 VIEWS COMPARISON:  Pelvis radiograph 05/11/2023 FINDINGS: Pelvis: Questionable  widening of the right sacroiliac joint. There are lucencies projecting over the right iliac bone, external artifact versus nondisplaced fractures. Pubic rami are intact. Pubic symphysis is congruent. Bilateral hip joint space narrowing without hip dislocation. Stellate stone in the pelvis typical of bladder stone is again seen. Right femur: Frontal views of the right femur obtained. Displaced proximal femoral shaft fracture. There is a nondisplaced fracture involving the greater trochanter. Distal femur is intact. No knee dislocation. IMPRESSION: 1. Displaced proximal femoral shaft fracture. A nondisplaced fracture component extends proximally and involves the greater trochanter. 2. Questionable widening of the right sacroiliac joint. 3. Lucencies projecting over the right iliac bone, external artifact versus nondisplaced fractures. Electronically Signed   By: Andrea Gasman M.D.   On: 04/17/2024 17:20   DG Chest Port 1 View Result Date: 04/17/2024 CLINICAL DATA:  Fall from roof. EXAM: PORTABLE CHEST 1 VIEW COMPARISON:  05/04/2021 FINDINGS: Cardiomegaly is stable. Unchanged mediastinal contours with diffuse aortic atherosclerosis. Right rib fractures 4 through 7 are mildly displaced, but no pneumothorax. No significant pleural effusion. Bronchovascular crowding related to low lung volumes. IMPRESSION: 1. Right rib fractures 4 through 7 are mildly displaced, but no pneumothorax or significant pleural effusion. 2. Chronic cardiomegaly. Electronically Signed   By: Andrea Gasman M.D.   On: 04/17/2024 17:18     Physical Exam Blood pressure 110/76, pulse (!) 110, temperature 97.6 F (36.4 C), temperature source  Oral, resp. rate 20, weight 89.4 kg, SpO2 99%. General: Agitated due to discomfort HEENT: pupils equal, round, reactive to light, moist conjunctiva, external inspection of ears and nose normal, hearing diminished, no wearing hearing aids that he wears at baseline, superficial abrasion/laceration to  posterior scalp Oropharynx: normal oropharyngeal mucosa, normal dentition, superficial abrasion to lower lip Neck: no thyromegaly, trachea midline, no midline cervical tenderness to palpation, no cervical spine step offs, C-collar removed personally CV: Sinus tachycardia, hypotensive, responsive to fluid Chest: breath sounds equal bilaterally, normal respiratory effort, no midline chest wall tenderness to palpation, + right and left lateral chest wall tenderness to palpation Abdomen: soft, nontender, and nondistended, no bruising  GU: normal external male genitalia Back: no wounds, no thoracic spine tenderness to palpation, no lumbar spine tenderness to palpation, no thoracic spine stepoffs, no lumbar spine stepoffs Extremities: 2+ radial and pedal pulses bilateral, RLE in traction, full right thigh compartment and firmer than left but still compressible Skin: warm and dry Psych: unable to assess  Neuro: GCS11 ([id number])    Assessment   Tom Norton is an 89 y.o. male s/p fall  Known Injuries: - Right femur fracture - SDH/SAH - Nondepressed parietoocipital bone fracture - Trace left basilar pneumothorax - Bilateral rib fractures - Thoracic TP fractures - Possible T9-10 discoligamentous injury - Left psoas hemorrhage without extravasation  Plan  - Admit to trauma service - Appreciate neurosurgery recommendations, Dr. Garst  - MRI thoracic spine when able  - Repeat CTH at 0400 - Appreciate orthopaedics recommendations, Dr. Jerri, Dr. Kendal  - Traction  - Tentative OR tomorrow AM if stable from hemodynamic/respiratory/neurologic perspective - FEN - strict NPO, once patient more alert can have RN perform bedside swallow to evaluate safety for pills - DVT - SCDs, hold chemical ppx due to bleeding concerns - Dispo - ICU   - EDP discussed with wife and brother at bedside about whether they would want patient intubated for respiratory distress or cardiac event and they said yes--I was  present for this conversation. I then discussed with brother and wife the scenario of cardiac arrest and that if full code he would receive chest compressions, medications and possible defibrillation. They voiced they they would want all of the above. Full Code.  Discussed entirety of plan of care with patient's wife and brother at bedside. All questions answered.  I reviewed ED provider notes, Consultant orthopaedic and neurosurgery notes, last 24 h vitals and pain scores, last 48 h intake and output, last 24 h labs and trends, and last 24 h imaging results.  Critical Care Time: 120 Minutes  Orie Silversmith, MD General Surgery, Surgical Critical Care and Trauma       [1]  Allergies Allergen Reactions   Bee Venom Anaphylaxis   Doxycycline  Hyclate Anaphylaxis   Yellow Jacket Venom Anaphylaxis   Atorvastatin Other (See Comments)   Doxycycline  Hyclate     Other Reaction(s): tachycardia/afib   Finasteride     Other Reaction(s): weak urinary stream   Sulfamethoxazole     Other Reaction(s): rash/tachycardia/hypotension   Bactrim [Sulfamethoxazole-Trimethoprim] Hives and Rash   Rivaroxaban  Rash   "

## 2024-04-17 NOTE — ED Notes (Signed)
 C-collar removed at this time by trauma MD

## 2024-04-18 ENCOUNTER — Inpatient Hospital Stay (HOSPITAL_COMMUNITY)

## 2024-04-18 ENCOUNTER — Other Ambulatory Visit: Payer: Self-pay

## 2024-04-18 ENCOUNTER — Encounter (HOSPITAL_COMMUNITY): Payer: Self-pay | Admitting: Anesthesiology

## 2024-04-18 ENCOUNTER — Encounter (HOSPITAL_COMMUNITY): Admission: EM | Disposition: E | Payer: Self-pay | Source: Home / Self Care

## 2024-04-18 DIAGNOSIS — Z515 Encounter for palliative care: Secondary | ICD-10-CM | POA: Diagnosis not present

## 2024-04-18 DIAGNOSIS — Z7189 Other specified counseling: Secondary | ICD-10-CM | POA: Diagnosis not present

## 2024-04-18 DIAGNOSIS — W19XXXA Unspecified fall, initial encounter: Secondary | ICD-10-CM

## 2024-04-18 DIAGNOSIS — S72141A Displaced intertrochanteric fracture of right femur, initial encounter for closed fracture: Secondary | ICD-10-CM

## 2024-04-18 LAB — CBC
HCT: 23.9 % — ABNORMAL LOW (ref 39.0–52.0)
HCT: 24.6 % — ABNORMAL LOW (ref 39.0–52.0)
HCT: 25.5 % — ABNORMAL LOW (ref 39.0–52.0)
HCT: 25.5 % — ABNORMAL LOW (ref 39.0–52.0)
HCT: 26.8 % — ABNORMAL LOW (ref 39.0–52.0)
HCT: 26.9 % — ABNORMAL LOW (ref 39.0–52.0)
HCT: 26.9 % — ABNORMAL LOW (ref 39.0–52.0)
Hemoglobin: 8.5 g/dL — ABNORMAL LOW (ref 13.0–17.0)
Hemoglobin: 8.7 g/dL — ABNORMAL LOW (ref 13.0–17.0)
Hemoglobin: 8.7 g/dL — ABNORMAL LOW (ref 13.0–17.0)
Hemoglobin: 8.7 g/dL — ABNORMAL LOW (ref 13.0–17.0)
Hemoglobin: 9 g/dL — ABNORMAL LOW (ref 13.0–17.0)
Hemoglobin: 9 g/dL — ABNORMAL LOW (ref 13.0–17.0)
Hemoglobin: 9.3 g/dL — ABNORMAL LOW (ref 13.0–17.0)
MCH: 31.1 pg (ref 26.0–34.0)
MCH: 31.3 pg (ref 26.0–34.0)
MCH: 31.5 pg (ref 26.0–34.0)
MCH: 31.8 pg (ref 26.0–34.0)
MCH: 31.8 pg (ref 26.0–34.0)
MCH: 32.1 pg (ref 26.0–34.0)
MCH: 32.1 pg (ref 26.0–34.0)
MCHC: 33.5 g/dL (ref 30.0–36.0)
MCHC: 33.5 g/dL (ref 30.0–36.0)
MCHC: 34.1 g/dL (ref 30.0–36.0)
MCHC: 34.1 g/dL (ref 30.0–36.0)
MCHC: 34.7 g/dL (ref 30.0–36.0)
MCHC: 35.4 g/dL (ref 30.0–36.0)
MCHC: 35.6 g/dL (ref 30.0–36.0)
MCV: 89.5 fL (ref 80.0–100.0)
MCV: 90.8 fL (ref 80.0–100.0)
MCV: 90.8 fL (ref 80.0–100.0)
MCV: 91.1 fL (ref 80.0–100.0)
MCV: 93.1 fL (ref 80.0–100.0)
MCV: 93.4 fL (ref 80.0–100.0)
MCV: 96.1 fL (ref 80.0–100.0)
Platelets: 100 K/uL — ABNORMAL LOW (ref 150–400)
Platelets: 107 K/uL — ABNORMAL LOW (ref 150–400)
Platelets: 118 K/uL — ABNORMAL LOW (ref 150–400)
Platelets: 87 K/uL — ABNORMAL LOW (ref 150–400)
Platelets: 90 K/uL — ABNORMAL LOW (ref 150–400)
Platelets: 92 K/uL — ABNORMAL LOW (ref 150–400)
Platelets: 96 K/uL — ABNORMAL LOW (ref 150–400)
RBC: 2.67 MIL/uL — ABNORMAL LOW (ref 4.22–5.81)
RBC: 2.71 MIL/uL — ABNORMAL LOW (ref 4.22–5.81)
RBC: 2.74 MIL/uL — ABNORMAL LOW (ref 4.22–5.81)
RBC: 2.8 MIL/uL — ABNORMAL LOW (ref 4.22–5.81)
RBC: 2.8 MIL/uL — ABNORMAL LOW (ref 4.22–5.81)
RBC: 2.88 MIL/uL — ABNORMAL LOW (ref 4.22–5.81)
RBC: 2.95 MIL/uL — ABNORMAL LOW (ref 4.22–5.81)
RDW: 14.8 % (ref 11.5–15.5)
RDW: 15.5 % (ref 11.5–15.5)
RDW: 15.7 % — ABNORMAL HIGH (ref 11.5–15.5)
RDW: 15.9 % — ABNORMAL HIGH (ref 11.5–15.5)
RDW: 16 % — ABNORMAL HIGH (ref 11.5–15.5)
RDW: 16.2 % — ABNORMAL HIGH (ref 11.5–15.5)
RDW: 16.2 % — ABNORMAL HIGH (ref 11.5–15.5)
WBC: 10.2 K/uL (ref 4.0–10.5)
WBC: 10.3 K/uL (ref 4.0–10.5)
WBC: 11.3 K/uL — ABNORMAL HIGH (ref 4.0–10.5)
WBC: 8.5 K/uL (ref 4.0–10.5)
WBC: 8.7 K/uL (ref 4.0–10.5)
WBC: 8.7 K/uL (ref 4.0–10.5)
WBC: 9.9 K/uL (ref 4.0–10.5)
nRBC: 0 % (ref 0.0–0.2)
nRBC: 0 % (ref 0.0–0.2)
nRBC: 0 % (ref 0.0–0.2)
nRBC: 0 % (ref 0.0–0.2)
nRBC: 0 % (ref 0.0–0.2)
nRBC: 0 % (ref 0.0–0.2)
nRBC: 0 % (ref 0.0–0.2)

## 2024-04-18 LAB — BPAM FFP
Blood Product Expiration Date: 202512282359
Unit Type and Rh: 6200

## 2024-04-18 LAB — URINALYSIS, ROUTINE W REFLEX MICROSCOPIC
Bilirubin Urine: NEGATIVE
Glucose, UA: NEGATIVE mg/dL
Ketones, ur: 5 mg/dL — AB
Leukocytes,Ua: NEGATIVE
Nitrite: NEGATIVE
Protein, ur: NEGATIVE mg/dL
Specific Gravity, Urine: >1.06 — ABNORMAL HIGH (ref 1.005–1.030)
pH: 5 (ref 5.0–8.0)

## 2024-04-18 LAB — BASIC METABOLIC PANEL WITH GFR
Anion gap: 11 (ref 5–15)
BUN: 20 mg/dL (ref 8–23)
CO2: 17 mmol/L — ABNORMAL LOW (ref 22–32)
Calcium: 7.7 mg/dL — ABNORMAL LOW (ref 8.9–10.3)
Chloride: 107 mmol/L (ref 98–111)
Creatinine, Ser: 1.06 mg/dL (ref 0.61–1.24)
GFR, Estimated: >60 mL/min
Glucose, Bld: 402 mg/dL — ABNORMAL HIGH (ref 70–99)
Potassium: 3.7 mmol/L (ref 3.5–5.1)
Sodium: 135 mmol/L (ref 135–145)

## 2024-04-18 LAB — PREPARE RBC (CROSSMATCH)

## 2024-04-18 LAB — TRAUMA TEG PANEL
CFF Max Amplitude: 10.5 mm — ABNORMAL LOW (ref 15–32)
Citrated Kaolin (R): 6.9 min (ref 4.6–9.1)
Citrated Rapid TEG (MA): 46.3 mm — ABNORMAL LOW (ref 52–70)
Lysis at 30 Minutes: 0 % (ref 0.0–2.6)

## 2024-04-18 LAB — MAGNESIUM: Magnesium: 1.6 mg/dL — ABNORMAL LOW (ref 1.7–2.4)

## 2024-04-18 LAB — HEMOGLOBIN A1C
Hgb A1c MFr Bld: 5.4 % (ref 4.8–5.6)
Mean Plasma Glucose: 108.28 mg/dL

## 2024-04-18 LAB — PREPARE FRESH FROZEN PLASMA

## 2024-04-18 LAB — GLUCOSE, CAPILLARY
Glucose-Capillary: 174 mg/dL — ABNORMAL HIGH (ref 70–99)
Glucose-Capillary: 129 mg/dL — ABNORMAL HIGH (ref 70–99)
Glucose-Capillary: 134 mg/dL — ABNORMAL HIGH (ref 70–99)
Glucose-Capillary: 124 mg/dL — ABNORMAL HIGH (ref 70–99)

## 2024-04-18 LAB — PHOSPHORUS: Phosphorus: 3.3 mg/dL (ref 2.5–4.6)

## 2024-04-18 SURGERY — FIXATION, FRACTURE, INTERTROCHANTERIC, WITH INTRAMEDULLARY ROD
Anesthesia: Choice | Laterality: Right

## 2024-04-18 MED ORDER — SODIUM CHLORIDE 0.9% IV SOLUTION: Freq: Once | INTRAVENOUS | Status: AC

## 2024-04-18 MED ORDER — TAMSULOSIN HCL 0.4 MG PO CAPS
0.8000 mg | ORAL_CAPSULE | Freq: Every day | ORAL | Status: DC
  Filled 2024-04-18 (×2): qty 2

## 2024-04-18 MED ORDER — METOPROLOL TARTRATE 25 MG PO TABS
25.0000 mg | ORAL_TABLET | Freq: Two times a day (BID) | ORAL | Status: DC
  Filled 2024-04-18: qty 1

## 2024-04-18 MED ORDER — SODIUM CHLORIDE 0.9 % IV BOLUS
500.0000 mL | Freq: Once | INTRAVENOUS | Status: AC
  Administered 2024-04-18: 500 mL via INTRAVENOUS

## 2024-04-18 MED ORDER — LEVOTHYROXINE SODIUM 50 MCG PO TABS
50.0000 ug | ORAL_TABLET | Freq: Every day | ORAL | Status: DC
  Administered 2024-04-19: 50 ug via ORAL
  Filled 2024-04-18: qty 1

## 2024-04-18 MED ORDER — METOPROLOL TARTRATE 25 MG PO TABS
25.0000 mg | ORAL_TABLET | Freq: Two times a day (BID) | ORAL | Status: DC
  Administered 2024-04-18 – 2024-04-21 (×5): 25 mg
  Filled 2024-04-18 (×4): qty 1

## 2024-04-18 MED ORDER — CALCIUM GLUCONATE-NACL 2-0.675 GM/100ML-% IV SOLN
2.0000 g | Freq: Once | INTRAVENOUS | Status: AC
  Administered 2024-04-18: 2000 mg via INTRAVENOUS
  Filled 2024-04-18: qty 100

## 2024-04-18 MED ORDER — NICARDIPINE HCL IN NACL 20-0.86 MG/200ML-% IV SOLN
INTRAVENOUS | Status: AC
  Filled 2024-04-18: qty 200

## 2024-04-18 MED ORDER — SODIUM CHLORIDE 0.9 % IV SOLN: 4.0000 g | Freq: Once | INTRAVENOUS | Status: DC

## 2024-04-18 MED ORDER — ALBUMIN HUMAN 5 % IV SOLN
12.5000 g | Freq: Once | INTRAVENOUS | Status: AC
  Administered 2024-04-18: 12.5 g via INTRAVENOUS
  Filled 2024-04-18: qty 250

## 2024-04-18 MED ORDER — SODIUM CHLORIDE 0.9 % IV SOLN: INTRAVENOUS | Status: AC | PRN

## 2024-04-18 MED ORDER — INSULIN ASPART 100 UNIT/ML IJ SOLN
0.0000 [IU] | INTRAMUSCULAR | Status: DC
  Administered 2024-04-18 – 2024-04-20 (×12): 1 [IU] via SUBCUTANEOUS
  Administered 2024-04-21 (×2): 3 [IU] via SUBCUTANEOUS
  Administered 2024-04-22 (×4): 2 [IU] via SUBCUTANEOUS
  Administered 2024-04-22: 3 [IU] via SUBCUTANEOUS
  Administered 2024-04-22: 2 [IU] via SUBCUTANEOUS
  Administered 2024-04-23: 3 [IU] via SUBCUTANEOUS
  Administered 2024-04-23 (×2): 2 [IU] via SUBCUTANEOUS
  Filled 2024-04-18 (×12): qty 1

## 2024-04-18 MED ORDER — NICARDIPINE HCL IN NACL 20-0.86 MG/200ML-% IV SOLN
3.0000 mg/h | INTRAVENOUS | Status: DC
  Administered 2024-04-18: 3 mg/h via INTRAVENOUS
  Administered 2024-04-18: 5 mg/h via INTRAVENOUS
  Administered 2024-04-19: 7.5 mg/h via INTRAVENOUS
  Administered 2024-04-19: 10 mg/h via INTRAVENOUS
  Administered 2024-04-19: 7.5 mg/h via INTRAVENOUS
  Administered 2024-04-19: 5 mg/h via INTRAVENOUS
  Administered 2024-04-20: 11 mg/h via INTRAVENOUS
  Administered 2024-04-20: 13 mg/h via INTRAVENOUS
  Administered 2024-04-20: 8 mg/h via INTRAVENOUS
  Filled 2024-04-18 (×3): qty 200
  Filled 2024-04-18: qty 400
  Filled 2024-04-18 (×3): qty 200

## 2024-04-18 MED ORDER — SODIUM CHLORIDE 0.9 % IV SOLN: INTRAVENOUS | Status: AC

## 2024-04-18 MED ORDER — MAGNESIUM SULFATE 4 GM/100ML IV SOLN
4.0000 g | Freq: Once | INTRAVENOUS | Status: AC
  Administered 2024-04-18: 4 g via INTRAVENOUS
  Filled 2024-04-18: qty 100

## 2024-04-18 NOTE — Progress Notes (Signed)
 Patient is tentatively posted for the OR for fixation of the right femur Saturday 12/27, will follow up head CT and neurosurgery recs in the morning before proceeding.

## 2024-04-18 NOTE — Progress Notes (Signed)
 Initial Nutrition Assessment  DOCUMENTATION CODES:   Not applicable  INTERVENTION:   Recommend initiate enteral nutrition support   Tube feeding via Cortrak tube: Pivot 1.5 at 25 ml/h and increase by 10 ml every 8 hours to goal rate of 55 ml/hr (1320 ml per day)  Provides 1980 kcal, 123 gm protein, 1003 ml free water  daily    NUTRITION DIAGNOSIS:   Increased nutrient needs related to  (trauma) as evidenced by estimated needs.  GOAL:   Patient will meet greater than or equal to 90% of their needs  MONITOR:   I & O's  REASON FOR ASSESSMENT:    (cortrak placement)    ASSESSMENT:   Pt with PMH of Afib on Eliquis , cardiomegaly, GERD, hiatal hernia, HLD, HTN, OA, and vitamin D  deficiency who was admitted after falling off the roof with R femur fx, SDH/SAH, nondepressed parietoccipital bone fxs, bilateral rib fxs, thoracic TP fxs, possible T9-10 injury, and L psoas hemorrhage.   Per trauma plan for OR 12/27. Pt remains NPO, cortrak tube placed. Palliative care consulted, per neurosurgery if hemorrhages expand pt is not a candidate for surgery. Per Palliative continue full scope of care and GOC discussions.   12/26 - s/p cortrak placement, xray pending   Medications reviewed and include: colace, SSI every 4 hours, synthroid  Mag sulfate x 1   Labs reviewed:  K 3.7 Phos 3.3 Mag 1.6 CBG 174      Diet Order:   Diet Order             Diet NPO time specified  Diet effective now                   EDUCATION NEEDS:   Not appropriate for education at this time  Skin:     Last BM:     Height:   Ht Readings from Last 1 Encounters:  10/17/23 5' 6 (1.676 m)    Weight:   Wt Readings from Last 1 Encounters:  04/17/24 89.4 kg    BMI:  Body mass index is 31.8 kg/m.  Estimated Nutritional Needs:   Kcal:  1800-2000  Protein:  115-130 grams  Fluid:  > 1.8 L/day  Powell SQUIBB., RD, LDN, CNSC See AMiON for contact information

## 2024-04-18 NOTE — Consult Note (Addendum)
 "                                                  Palliative Care Consult Note                                  Date: 04/18/2024   Patient Name: Tom Norton  DOB:07-Apr-1930  FMW:[id number]  Age / Sex:89 y.o., male  PCP: Charlott Dorn LABOR, MD Referring Physician: Md, Trauma, MD  Reason for Consultation: Establishing goals of care  Past Medical History:  Diagnosis Date   BPH (benign prostatic hyperplasia)    Cardiomegaly    Dilated aortic root    a. 09/2013: 4.2cm by echo.   Dysrhythmia    Enlarged prostate without lower urinary tract symptoms (luts)    GERD (gastroesophageal reflux disease)    rare   History of hiatal hernia    Hyperlipidemia    Hypertension    Hypothyroidism    Insomnia    Junctional bradycardia    OA (osteoarthritis)    Obesity    Paroxysmal A-fib (HCC)    a. 09/23/13 a-fib with RVR and hypotensive, requiring emergent cardioversion in ED   Peripheral neuropathy 07/17/2017   Vitamin D  deficiency     Assessment & Plan:   HPI/Patient Profile: 89 y.o. male  with past medical history of HLD, HTN, osteoarthritis, paroxysmal atrial fibrillation on eliquis  admitted on 04/17/2024  with SDH, SAH, and right femur fracture 2/2 fall from balcony. CT head 04/17/2024 w/o contrast demonstrated left posterior SDH up to 9 mm thickness, multifocal bilateral SAH, linear skull fracture of left parietooccipital bone, right posterior scalp hematoma. Repeat CT head on 04/18/2024 demonstrate increase in multifocal hemorrhage, now bilateral SDH, increased SAH volume, minimal midline shift has increased. Per neurosurgery note by Darnella MD on 04/17/2024 patient not a candidate for hematoma evacuation surgery if his hemorrhages were to expand.   Palliative medicine consulted for goals of care conversation.   SUMMARY OF RECOMMENDATIONS   Full code, full scope, will continue conversations Spiritual Care consult for family PMT will continue to follow for further GOCs Patient's  Advance Directives states  If 2 physicians, one of which is a Dispensing Optician, determine that I lack capacity to make or communicate health care decisions and: I have an incurable or irreversible condition that will result in my death within a relatively short period of time I become unconscious and my health care providers determine that, to a high degree of medical certainty, I will never regain my consciousness I suffer from advanced dementia or any other condition which results in the substantial loss of my cognitive ability and my healthcare providers determine that, to a high degree of medical certainty, this loss is not reversible In those situations I have stated in section 1, I direct that my health care providers: Shall withhold or withdraw life-prolonging measures Exceptions - Artificial Nutrition or Hydration No nutrition or hydration is desired if artificial life support is withheld or withdrawn I wish to be made as comfortable as possible I direct that my health care providers take reasonable steps to keep me as clean, comfortable, and free of pain as possible so that my dignity is maintained, even though this care may hasten my death If I have appointed  a health care agent by executing a health care power of attorney or similar instrument, and that health care agent is acting and available and gives instructions that differ from this Advance Directive, then I direct that: This Advance Directive will OVERRIDE instructions my health care agent gives about prolonging my life Documented Health Care Agents Tom Norton Tom Norton Tom Norton  Symptom Management:  Per primary team  Code Status: Full Code  Prognosis:  Unable to determine  Discharge Planning:  To Be Determined   Discussed with: Lovick MD 04/18/2024 about the family's wishes to continue full code and full scope interventions at this point but aware that the overall prognosis is  poor.   Subjective:   Reviewed medical records, received report from team, assessed the patient and then meet at the patient's bedside to discuss diagnosis, prognosis, GOC, EOL wishes disposition and options.  I met with patient, [name]), and son Tom Norton) at the bedside. Patient was somnolent after receiving analgesic. Wife and son are documented HCPOA.    We meet to discuss diagnosis prognosis, GOC, EOL wishes, disposition and options. Concept of Palliative Care was introduced as specialized medical care for people and their families living with serious illness.  If focuses on providing relief from the symptoms and stress of a serious illness.  The goal is to improve quality of life for both the patient and the family. Values and goals of care important to patient and family were attempted to be elicited.  Created space and opportunity for patient  and family to explore thoughts and feelings regarding current medical situation   Natural trajectory and current clinical status were discussed. Questions and concerns addressed. Patient encouraged to call with questions or concerns.    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 as they live out in [place], [place] with a good surrounding community, but he is still worried about their overall health and gradual decline in their later years  Today's Discussion: - Discussed with family about the patient's overall health and life prior to hospitalization, current admission, and trajectory - Family shares that they are hopeful for a full recovery to the same level of function prior to his fall - Shared with family that the medical team hopes for that as well but that we also have to be realistic and the chances of that full recovery is very unlikely - Discussed best case scenario with the family and shared that in the most optimistic view he may be able have some meaningful neurological recovery enough to participate in rehab, but the length of time for him  would be prolonged and the journey will be very difficult - Spoke frankly with family and that the outlook is very grim - Shared with family that neurosurgery has deemed patient not a candidate for evacuation of hemorrhages and with the expansion of the bleed, this will most likely continue to cause mass effect on the brain leading to poor neurological outcomes - Shared with family the patient's advanced directives that patient would not want to prolong his life if there is high certainty that he will not regain consciousness - Family reiterates that they want all available interventions offered at this time and that want more time for outcomes as this is still early on - Discussed code status with the family and shared that given the age of the patient as well as co-morbid conditions, the chances of successful resuscitation is low, CPR and full resuscitation attempts would most likely cause more harm than good for the patient, but family have opted to continue full code, full scope at this time  Review of Systems  Unable to perform  ROS   Objective:   Primary Diagnoses: Present on Admission:  Critical polytrauma   Vital Signs:  BP (!) 153/76   Pulse (!) 103   Temp 98.4 F (36.9 C) (Axillary)   Resp 20   Wt 89.4 kg   SpO2 96%   BMI 31.80 kg/m   Physical Exam Constitutional:      Appearance: He is ill-appearing.     Comments: Obtunded.  HENT:     Nose: Nose normal.     Mouth/Throat:     Mouth: Mucous membranes are dry.  Cardiovascular:     Rate and Rhythm: Tachycardia present.     Pulses: Normal pulses.  Pulmonary:     Effort: Pulmonary effort is normal.  Abdominal:     Palpations: Abdomen is soft.  Musculoskeletal:     Right lower leg: No edema.     Left lower leg: No edema.  Skin:    General: Skin is warm and dry.    Palliative Assessment/Data: 10%    Thank you for allowing us  to participate in the care of Tom Norton PMT will continue to support holistically.  I personally spent a total of 55 minutes in the care of the patient today including preparing to see the patient, getting/reviewing separately obtained history, performing a medically appropriate exam/evaluation, counseling and educating, referring and communicating with other health care professionals, and documenting clinical information in the EHR.  Signed by: [name] [name] Palliative Medicine Team  Team Phone # [phone] (Nights/Weekends)  04/18/2024, 9:52 AM   "

## 2024-04-18 NOTE — Procedures (Signed)
 Cortrak  Person Inserting Tube:  Mady Dolly, RD Tube Type:  Cortrak - 43 inches Tube Size:  10 Tube Location:  Left nare Secured by: Bridle Technique Used to Measure Tube Placement:  Marking at nare/corner of mouth Cortrak Secured At:  61 cm Initial Placement Verification:  Xray  Cortrak Tube Team Note:  Consult received to place a Cortrak feeding tube.   X-ray is required. RN may begin using tube post confirmation of appropriate positioning.   If the tube becomes dislodged please keep the tube and contact the Cortrak team at [email or web address] for replacement.  If after hours and replacement cannot be delayed, place a NG tube and confirm placement with an abdominal x-ray.    Dolly Mady MS, RD, LDN Registered Dietitian I Clinical Nutrition RD Inpatient Contact Info in Amion

## 2024-04-18 NOTE — Consult Note (Signed)
 Neurosurgery Consult Note  Assessment:  89 y/o M w/ hx Afib on Eliquis  who fell off a roof and sustained multiple injuries including multiple scattered intracranial hemorrhages (worse on repeat imaging), nondisplaced L parietal skull fx, T9-10 possible discoligamentous injury, femur fracture, rib fractures  Plan:  Patient is not a candidate for emergent cranial surgery if his hemorrhages were to expand SBP<140 Repeat CT in AM to look for stability Nothing to do for skull fx Bedrest, HOB as tolerated. Will ultimately require MRI-Tspine when able. B Hold all anticoagulation and antiplatelets Will require stable CT head prior to clearance for orthopedic surgery  CC: fall  HPI:     Patient is a 89 y.o. male w/ hx Afib on Eliquis  who fell 10 feet from a roof yesterday.  CT head shows various scattered traumatic hemorrhages.  Kcentra  given for reversal. Repeat CT scan shows that these hemorrhages had all slightly blossomed compared to yesterday.  There are also multiple TP fractures and some concern for a discal ligamentous injury at T9-10 due to slight fishmouthing of the disc space.  He also has a right femur fracture and rib fractures  I had a long discussion with the family at the bedside regarding his guarded prognosis and lack of candidacy for brain surgery. Family wants to be fully aggressive   Patient Active Problem List   Diagnosis Date Noted   Critical polytrauma 04/17/2024   Right inguinal hernia 09/11/2021   Fall    Open knee wound, left, initial encounter 02/17/2021   Rib fracture 02/17/2021   Intestinal obstruction without gangrene due to recurrent left inguinal hernia 05/02/2019   Dehydration 05/02/2019   Essential hypertension 05/02/2019   Dyslipidemia 05/02/2019   Hyperglycemia 05/02/2019   Primary osteoarthritis of left knee 07/20/2017   S/P knee replacement 07/20/2017   Peripheral neuropathy 07/17/2017   Paroxysmal atrial fibrillation (HCC) 10/31/2015    Hypothyroidism 10/31/2015   Dilated aortic root 10/08/2014   Tick bite of right thigh 08/18/2014   Rash 12/10/2013   Hypotension 09/23/2013   Renal insufficiency 09/23/2013   Past Medical History:  Diagnosis Date   BPH (benign prostatic hyperplasia)    Cardiomegaly    Dilated aortic root    a. 09/2013: 4.2cm by echo.   Dysrhythmia    Enlarged prostate without lower urinary tract symptoms (luts)    GERD (gastroesophageal reflux disease)    rare   History of hiatal hernia    Hyperlipidemia    Hypertension    Hypothyroidism    Insomnia    Junctional bradycardia    OA (osteoarthritis)    Obesity    Paroxysmal A-fib (HCC)    a. 09/23/13 a-fib with RVR and hypotensive, requiring emergent cardioversion in ED   Peripheral neuropathy 07/17/2017   Vitamin D  deficiency     Past Surgical History:  Procedure Laterality Date   CARDIOVERSION  2015   CARPAL TUNNEL RELEASE Left 2019   I & D EXTREMITY Left 02/18/2021   Procedure: IRRIGATION AND DEBRIDEMENT KNEE;  Surgeon: Harden Jerona GAILS, MD;  Location: [facility] OR;  Service: Orthopedics;  Laterality: Left;   INGUINAL HERNIA REPAIR Left 05/03/2019   Procedure: HERNIA REPAIR INGUINAL ADULT WITH MESH;  Surgeon: Eletha Boas, MD;  Location: [facility]S;  Service: General;  Laterality: Left;   INGUINAL HERNIA REPAIR Left 1980   INGUINAL HERNIA REPAIR Right 09/12/2021   Procedure: OPEN REPAIR RIGHT INGUINAL HERNIA WITH MESH;  Surgeon: Eletha Boas, MD;  Location: [facility];  Service: General;  Laterality:  Right;   TOTAL KNEE ARTHROPLASTY Left 07/20/2017   Procedure: LEFT TOTAL KNEE ARTHROPLASTY;  Surgeon: Gerome Charleston, MD;  Location: [facility];  Service: Orthopedics;  Laterality: Left;  Adductor Block   TRANSTHORACIC ECHOCARDIOGRAM  09/23/2013   EF 55-60%; normal wall motion. Moderately dilated aortic root and ascending aorta; mild AI; moderate bi-atrial enlargement    Medications Prior to Admission  Medication Sig Dispense Refill Last Dose/Taking   AREXVY 120  MCG/0.5ML injection Inject 0.5 mLs into the muscle once.   03/28/2024   CINNAMON PO Take 2,000 mg by mouth daily.   04/16/2024   Coenzyme Q10 (CO Q10) 100 MG CAPS Take 100 mg by mouth daily.   04/16/2024   cyanocobalamin  1000 MCG tablet Take 1,000 mcg by mouth daily.   04/16/2024   ELIQUIS  5 MG TABS tablet TAKE 1 TABLET BY MOUTH 2 TIMES DAILY. 60 tablet 5 04/17/2024 at  9:00 AM   eszopiclone  (LUNESTA ) 1 MG TABS tablet Take 1 tablet (1 mg total) by mouth at bedtime. Take immediately before bedtime 5 tablet 0 04/16/2024   levothyroxine  (SYNTHROID ) 50 MCG tablet Take 50 mcg by mouth daily before breakfast.   04/16/2024   Multiple Vitamin (MULTIVITAMIN WITH MINERALS) TABS Take 1 tablet by mouth daily.   04/16/2024   OVER THE COUNTER MEDICATION Take 2 capsules by mouth daily. Quietum Plus Memory supplement   04/16/2024   pyridOXINE (VITAMIN B6) 100 MG tablet Take 100 mg by mouth daily.   04/16/2024   tamsulosin  (FLOMAX ) 0.4 MG CAPS capsule Take 0.8 mg by mouth daily.   04/16/2024   Allergies[1]  Social History   Tobacco Use   Smoking status: Never    Passive exposure: Never   Smokeless tobacco: Never  Substance Use Topics   Alcohol use: Yes    Alcohol/week: 10.0 standard drinks of alcohol    Types: 10 Glasses of wine per week    Comment: couple of glasses of wine a day most days    Family History  Problem Relation Age of Onset   Heart disease Father    Stroke Brother    Heart disease Brother      Review of Systems Pertinent items are noted in HPI.  Objective:   Patient Vitals for the past 8 hrs:  BP Temp Temp src Pulse Resp SpO2  04/18/24 0800 -- 98.4 F (36.9 C) Axillary -- -- --  04/18/24 0700 (!) 153/76 -- -- (!) 103 20 96 %  04/18/24 0645 119/70 -- -- 99 19 96 %  04/18/24 0630 136/86 -- -- (!) 103 (!) 22 93 %  04/18/24 0615 120/64 -- -- 99 (!) 23 95 %  04/18/24 0600 119/81 -- -- (!) 109 (!) 26 94 %  04/18/24 0545 115/74 -- -- 100 (!) 24 96 %  04/18/24 0530 121/69 -- --  (!) 105 (!) 24 95 %  04/18/24 0515 -- -- -- (!) 108 (!) 25 95 %  04/18/24 0500 -- -- -- (!) 109 (!) 24 94 %  04/18/24 0445 114/81 -- -- (!) 105 (!) 24 96 %  04/18/24 0430 (!) 125/93 -- -- -- -- --  04/18/24 0415 -- -- -- (!) 103 (!) 25 95 %  04/18/24 0405 99/70 98.6 F (37 C) Axillary (!) 103 (!) 24 95 %  04/18/24 0400 99/70 97.9 F (36.6 C) Axillary (!) 106 (!) 26 95 %  04/18/24 0345 -- -- -- (!) 113 19 93 %  04/18/24 0330 109/77 -- -- (!) 106 ROLLEN)  24 97 %  04/18/24 0315 101/69 -- -- (!) 103 (!) 26 95 %   I/O last 3 completed shifts: In: 54 [I.V.:1675.9; Blood:785.6; IV Piggyback:2180.5] Out: 467 [Urine:467] Total I/O In: -  Out: 75 [Urine:75]    Exam: GCS 2E 1V 73M PERRL Conjugate gaze Withdraws BUE and LLE to stimulation RLE in traction    Data ReviewCBC:  Lab Results  Component Value Date   WBC 9.9 04/18/2024   RBC 2.95 (L) 04/18/2024   BMP:  Lab Results  Component Value Date   GLUCOSE 402 (H) 04/18/2024   CO2 17 (L) 04/18/2024   BUN 20 04/18/2024   CREATININE 1.06 04/18/2024   CALCIUM  7.7 (L) 04/18/2024         [1]  Allergies Allergen Reactions   Bee Venom Anaphylaxis   Doxycycline  Hyclate Anaphylaxis   Yellow Jacket Venom Anaphylaxis   Atorvastatin Other (See Comments)   Doxycycline  Hyclate     Other Reaction(s): tachycardia/afib   Finasteride     Other Reaction(s): weak urinary stream   Sulfamethoxazole     Other Reaction(s): rash/tachycardia/hypotension   Bactrim [Sulfamethoxazole-Trimethoprim] Hives and Rash   Rivaroxaban  Rash

## 2024-04-18 NOTE — Procedures (Signed)
 Arterial Catheter Insertion Procedure Note  Tom Norton  [id number]  [birth year]  Date:04/18/2024  Time:5:21 AM    Provider Performing: Darold LITTIE Sharps, BS, RRT-ACCS, RCP   Procedure: Insertion of Arterial Line (63379) without US  guidance  Indication(s) Blood pressure monitoring and/or need for frequent ABGs  Consent Unable to obtain consent due to emergent nature of procedure.  Anesthesia None   Time Out Verified patient identification, verified procedure, site/side was marked, verified correct patient position, special equipment/implants available, medications/allergies/relevant history reviewed, required imaging and test results available.   Sterile Technique Maximal sterile technique including full sterile barrier drape, hand hygiene, sterile gown, sterile gloves, mask, hair covering, sterile ultrasound probe cover (if used).   Procedure Description Area of catheter insertion was cleaned with chlorhexidine  and draped in sterile fashion. Without real-time ultrasound guidance an arterial catheter was placed into the left radial artery.  Appropriate arterial tracings confirmed on monitor.     Complications/Tolerance None; patient tolerated the procedure well.   EBL Minimal

## 2024-04-18 NOTE — TOC Initial Note (Signed)
 Transition of Care [facility]) - Initial/Assessment Note    Patient Details  Name: Tom Norton MRN: [id number] Date of Birth: 29-Aug-1929  [facility]) CM/SW Contact:    Yaileen Hofferber E Petula Rotolo, LCSW Phone Number: 04/18/2024, 12:14 PM  Clinical Narrative:                 Patient was admitted after falling from a roof which led to intracranial hemorrhages and multiple fractures. Patient reviewed in rounds - family is involved and supportive. Palliative, Trauma, and Neuro to follow up with family for treatment plan. ICM will continue to follow.    Barriers to Discharge: Continued Medical Work up   Patient Goals and CMS Choice            Expected Discharge Plan and Services       Living arrangements for the past 2 months: Single Family Home                                      Prior Living Arrangements/Services Living arrangements for the past 2 months: Single Family Home Lives with:: Spouse Patient language and need for interpreter reviewed:: Yes        Need for Family Participation in Patient Care: Yes (Comment) Care giver support system in place?: Yes (comment)   Criminal Activity/Legal Involvement Pertinent to Current Situation/Hospitalization: No - Comment as needed  Activities of Daily Living      Permission Sought/Granted                  Emotional Assessment         Alcohol / Substance Use: Not Applicable Psych Involvement: No (comment)  Admission diagnosis:  Critical polytrauma [T07.XXXA] Patient Active Problem List   Diagnosis Date Noted   Critical polytrauma 04/17/2024   Right inguinal hernia 09/11/2021   Fall    Open knee wound, left, initial encounter 02/17/2021   Rib fracture 02/17/2021   Intestinal obstruction without gangrene due to recurrent left inguinal hernia 05/02/2019   Dehydration 05/02/2019   Essential hypertension 05/02/2019   Dyslipidemia 05/02/2019   Hyperglycemia 05/02/2019   Primary osteoarthritis of left  knee 07/20/2017   S/P knee replacement 07/20/2017   Peripheral neuropathy 07/17/2017   Paroxysmal atrial fibrillation (HCC) 10/31/2015   Hypothyroidism 10/31/2015   Dilated aortic root 10/08/2014   Tick bite of right thigh 08/18/2014   Rash 12/10/2013   Hypotension 09/23/2013   Renal insufficiency 09/23/2013   PCP:  Charlott Dorn LABOR, MD Pharmacy:   [facility] - [place], [place] - [address] [place] [address] Phone: [phone] Fax: [phone]     Social Drivers of Health (SDOH) Social History: SDOH Screenings   Tobacco Use: Low Risk (04/17/2024)   SDOH Interventions:     Readmission Risk Interventions     No data to display

## 2024-04-18 NOTE — Progress Notes (Signed)
 Alerted MD Lovick that patient's systolic bp was in 150s despite administration of prn hydralazine .  MD placed orders for cortrak as well as bp meds.

## 2024-04-18 NOTE — Anesthesia Preprocedure Evaluation (Addendum)
"                                    Anesthesia Evaluation    Reviewed: Allergy & Precautions, Patient's Chart, lab work & pertinent test results, Unable to perform ROS - Chart review only  Airway        Dental   Pulmonary neg pulmonary ROS          Cardiovascular hypertension, Pt. on medications + dysrhythmias Atrial Fibrillation      Neuro/Psych negative neurological ROS  negative psych ROS   GI/Hepatic Neg liver ROS, hiatal hernia,GERD  ,,  Endo/Other  Hypothyroidism    Renal/GU      Musculoskeletal  (+) Arthritis ,    Abdominal   Peds  Hematology negative hematology ROS (+)   Anesthesia Other Findings   Reproductive/Obstetrics                              Anesthesia Physical Anesthesia Plan  ASA: 3  Anesthesia Plan: General   Post-op Pain Management: Tylenol  PO (pre-op)*   Induction: Intravenous  PONV Risk Score and Plan: 2 and Ondansetron , Dexamethasone  and Treatment may vary due to age or medical condition  Airway Management Planned: Oral ETT  Additional Equipment:   Intra-op Plan:   Post-operative Plan: Possible Post-op intubation/ventilation  Informed Consent:   Plan Discussed with:   Anesthesia Plan Comments:          Anesthesia Quick Evaluation  "

## 2024-04-18 NOTE — Progress Notes (Addendum)
 "  Trauma/Critical Care Follow Up Note  Subjective:    Overnight Issues:   Objective:  Vital signs for last 24 hours: Temp:  [97.9 F (36.6 C)-99.6 F (37.6 C)] 98.9 F (37.2 C) (12/26 1200) Pulse Rate:  [68-119] 75 (12/26 1800) Resp:  [13-43] 18 (12/26 1800) BP: (69-162)/(52-93) 139/75 (12/26 1800) SpO2:  [92 %-99 %] 98 % (12/26 1800) Arterial Line BP: (108-153)/(46-68) 134/57 (12/26 1800)  Intake/Output from previous day: 12/25 0701 - 12/26 0700 In: 4642 [I.V.:1675.9; Blood:785.6; IV Piggyback:2180.5] Out: 467 [Urine:467]  Intake/Output this shift: No intake/output data recorded.  Vent settings for last 24 hours:    Physical Exam:  Gen: comfortable, no distress Neuro: follows commands, alert, communicative HEENT: PERRL Neck: supple CV: RRR and hypertensive Pulm: unlabored breathing on [place] Abd: soft, NT  , +BM GU: urine clear and yellow, +spontaneous voids Extr: wwp, no edema  Results for orders placed or performed during the hospital encounter of 04/17/24 (from the past 24 hours)  MRSA Next Gen by PCR, Nasal     Status: None   Collection Time: 04/17/24  8:06 PM   Specimen: Nasal Mucosa; Nasal Swab  Result Value Ref Range   MRSA by PCR Next Gen NOT DETECTED NOT DETECTED  Prepare RBC (crossmatch)     Status: None   Collection Time: 04/17/24  8:50 PM  Result Value Ref Range   Order Confirmation      ORDER PROCESSED BY BLOOD BANK Performed at [facility], [address] [place], [place] [address]   CBC     Status: Abnormal   Collection Time: 04/17/24 11:47 PM  Result Value Ref Range   WBC 11.3 (H) 4.0 - 10.5 K/uL   RBC 2.80 (L) 4.22 - 5.81 MIL/uL   Hemoglobin 9.0 (L) 13.0 - 17.0 g/dL   HCT 73.0 (L) 60.9 - 47.9 %   MCV 96.1 80.0 - 100.0 fL   MCH 32.1 26.0 - 34.0 pg   MCHC 33.5 30.0 - 36.0 g/dL   RDW 85.1 88.4 - 84.4 %   Platelets 118 (L) 150 - 400 K/uL   nRBC 0.0 0.0 - 0.2 %  Urinalysis, Routine w reflex microscopic -Urine, Clean Catch     Status:  Abnormal   Collection Time: 04/17/24 11:59 PM  Result Value Ref Range   Color, Urine YELLOW YELLOW   APPearance HAZY (A) CLEAR   Specific Gravity, Urine >1.060 (H) 1.005 - 1.030   pH 5.0 5.0 - 8.0   Glucose, UA NEGATIVE NEGATIVE mg/dL   Hgb urine dipstick LARGE (A) NEGATIVE   Bilirubin Urine NEGATIVE NEGATIVE   Ketones, ur 5 (A) NEGATIVE mg/dL   Protein, ur NEGATIVE NEGATIVE mg/dL   Nitrite NEGATIVE NEGATIVE   Leukocytes,Ua NEGATIVE NEGATIVE   RBC / HPF 11-20 0 - 5 RBC/hpf   WBC, UA 0-5 0 - 5 WBC/hpf   Bacteria, UA RARE (A) NONE SEEN   Squamous Epithelial / HPF 0-5 0 - 5 /HPF   Mucus PRESENT   Prepare RBC (crossmatch)     Status: None   Collection Time: 04/18/24 12:52 AM  Result Value Ref Range   Order Confirmation      ORDER PROCESSED BY BLOOD BANK Performed at [facility], [address] [place], [place] [address]   Prepare fresh frozen plasma     Status: None   Collection Time: 04/18/24 12:52 AM  Result Value Ref Range   Unit Number [id number]    Blood Component Type  THW PLS APHR    Unit division B0    Status of Unit REL FROM [facility]    Transfusion Status      OK TO TRANSFUSE Performed at [facility], [address], [place], [place] [address]   CBC     Status: Abnormal   Collection Time: 04/18/24  3:27 AM  Result Value Ref Range   WBC 8.7 4.0 - 10.5 K/uL   RBC 2.74 (L) 4.22 - 5.81 MIL/uL   Hemoglobin 8.7 (L) 13.0 - 17.0 g/dL   HCT 74.4 (L) 60.9 - 47.9 %   MCV 93.1 80.0 - 100.0 fL   MCH 31.8 26.0 - 34.0 pg   MCHC 34.1 30.0 - 36.0 g/dL   RDW 84.4 88.4 - 84.4 %   Platelets 96 (L) 150 - 400 K/uL   nRBC 0.0 0.0 - 0.2 %  Basic metabolic panel     Status: Abnormal   Collection Time: 04/18/24  3:27 AM  Result Value Ref Range   Sodium 135 135 - 145 mmol/L   Potassium 3.7 3.5 - 5.1 mmol/L   Chloride 107 98 - 111 mmol/L   CO2 17 (L) 22 - 32 mmol/L   Glucose, Bld 402 (H) 70 - 99 mg/dL   BUN 20 8 - 23 mg/dL   Creatinine, Ser 8.93 0.61 - 1.24 mg/dL    Calcium  7.7 (L) 8.9 - 10.3 mg/dL   GFR, Estimated >39 >39 mL/min   Anion gap 11 5 - 15  Magnesium      Status: Abnormal   Collection Time: 04/18/24  3:27 AM  Result Value Ref Range   Magnesium  1.6 (L) 1.7 - 2.4 mg/dL  Phosphorus     Status: None   Collection Time: 04/18/24  3:27 AM  Result Value Ref Range   Phosphorus 3.3 2.5 - 4.6 mg/dL  Trauma TEG Panel     Status: Abnormal   Collection Time: 04/18/24  5:03 AM  Result Value Ref Range   Citrated Kaolin (R) 6.9 4.6 - 9.1 min   Citrated Rapid TEG (MA) 46.3 (L) 52 - 70 mm   CFF Max Amplitude 10.5 (L) 15 - 32 mm   Lysis at 30 Minutes 0 0.0 - 2.6 %  CBC     Status: Abnormal   Collection Time: 04/18/24  5:03 AM  Result Value Ref Range   WBC 8.7 4.0 - 10.5 K/uL   RBC 2.88 (L) 4.22 - 5.81 MIL/uL   Hemoglobin 9.0 (L) 13.0 - 17.0 g/dL   HCT 73.0 (L) 60.9 - 47.9 %   MCV 93.4 80.0 - 100.0 fL   MCH 31.3 26.0 - 34.0 pg   MCHC 33.5 30.0 - 36.0 g/dL   RDW 84.2 (H) 88.4 - 84.4 %   Platelets 100 (L) 150 - 400 K/uL   nRBC 0.0 0.0 - 0.2 %  Glucose, capillary     Status: Abnormal   Collection Time: 04/18/24  5:03 AM  Result Value Ref Range   Glucose-Capillary 174 (H) 70 - 99 mg/dL  CBC     Status: Abnormal   Collection Time: 04/18/24  8:03 AM  Result Value Ref Range   WBC 9.9 4.0 - 10.5 K/uL   RBC 2.95 (L) 4.22 - 5.81 MIL/uL   Hemoglobin 9.3 (L) 13.0 - 17.0 g/dL   HCT 73.1 (L) 60.9 - 47.9 %   MCV 90.8 80.0 - 100.0 fL   MCH 31.5 26.0 - 34.0 pg   MCHC 34.7 30.0 - 36.0  g/dL   RDW 84.0 (H) 88.4 - 84.4 %   Platelets 107 (L) 150 - 400 K/uL   nRBC 0.0 0.0 - 0.2 %  CBC     Status: Abnormal   Collection Time: 04/18/24 12:20 PM  Result Value Ref Range   WBC 8.5 4.0 - 10.5 K/uL   RBC 2.80 (L) 4.22 - 5.81 MIL/uL   Hemoglobin 8.7 (L) 13.0 - 17.0 g/dL   HCT 74.4 (L) 60.9 - 47.9 %   MCV 91.1 80.0 - 100.0 fL   MCH 31.1 26.0 - 34.0 pg   MCHC 34.1 30.0 - 36.0 g/dL   RDW 83.7 (H) 88.4 - 84.4 %   Platelets 87 (L) 150 - 400 K/uL   nRBC 0.0 0.0 -  0.2 %  Hemoglobin A1c     Status: None   Collection Time: 04/18/24  2:13 PM  Result Value Ref Range   Hgb A1c MFr Bld 5.4 4.8 - 5.6 %   Mean Plasma Glucose 108.28 mg/dL  Glucose, capillary     Status: Abnormal   Collection Time: 04/18/24  4:28 PM  Result Value Ref Range   Glucose-Capillary 129 (H) 70 - 99 mg/dL  CBC     Status: Abnormal   Collection Time: 04/18/24  7:20 PM  Result Value Ref Range   WBC 10.2 4.0 - 10.5 K/uL   RBC 2.71 (L) 4.22 - 5.81 MIL/uL   Hemoglobin 8.7 (L) 13.0 - 17.0 g/dL   HCT 75.3 (L) 60.9 - 47.9 %   MCV 90.8 80.0 - 100.0 fL   MCH 32.1 26.0 - 34.0 pg   MCHC 35.4 30.0 - 36.0 g/dL   RDW 83.7 (H) 88.4 - 84.4 %   Platelets 90 (L) 150 - 400 K/uL   nRBC 0.0 0.0 - 0.2 %    Assessment & Plan:  LOS: 1 day   Additional comments:I reviewed the patient's new clinical lab test results.   and I reviewed the patients new imaging test results.    89 y.o. male s/p fall   Known Injuries: - Right femur fracture - SDH/SAH - Nondepressed parietoocipital bone fracture - Trace left basilar pneumothorax - Bilateral rib fractures - Thoracic TP fractures - Possible T9-10 discoligamentous injury - Left psoas hemorrhage without extravasation   Plan  - Admit to trauma service - Appreciate neurosurgery recommendations, Dr. Garst              - MRI thoracic spine when able              - Repeat CTH at 0600 tomorrow   - await repeat head CT for decision re: OR with ortho - q1h neuro check - Appreciate orthopaedics recommendations, Dr. Jerri, Dr. Kendal              - Traction              - Tentative OR tomorrow AM if stable from neurologic perspective - FEN - strict NPO, place cortrak, SLP eval - DVT - SCDs, hold chemical ppx due to bleeding concerns - Dispo - ICU, clinical update provided to family at bedside - son and granddaughter-in-law. They are requesting transfer to [facility], which I initiated   Dreama GEANNIE Hanger, MD Trauma & General Surgery Please use [email or web address] to  contact on call provider  04/18/2024  *Care during the described time interval was provided by me. I have reviewed this patient's available data, including medical history, events of note, physical examination and test results  as part of my evaluation.    "

## 2024-04-18 NOTE — Progress Notes (Signed)
 Request for transfer to [facility] by family. Call placed to transfer center. Return call at 1724 from [facility] transfer center and spoke with Dr. CANDIE Fairly with neurocritical care who accepted the patient for transfer. All imaging pushed electronically via powershare. Advised to await confirmation of bed availability before arranging transport. Call received from transfer center at 1810 advising that [facility] is rescinding transfer. When inquiring about reason for rescinding acceptance, was told that patient had received clinical acceptance, but had not received administrative acceptance. Communicated the change in plan to the patient's son via phone at 1826.

## 2024-04-18 NOTE — Consult Note (Signed)
 "  ORTHOPAEDIC CONSULTATION  REQUESTING PHYSICIAN: Richerd Silversmith, MD  Chief Complaint: Right femur fx  HPI: Tom Norton is a 89 y.o. male who presents with right femur fx s/p falling 10 ft from balcony while cleaning leaves.  On eliquis  for afib.  Suffered head bleed, Kcentra  administered for reversal.  Admitted to trauma ICU.  Repeat CT head shows slight worsening with new SDHs.  Ortho consulted for right femur fx.  Past Medical History:  Diagnosis Date   BPH (benign prostatic hyperplasia)    Cardiomegaly    Dilated aortic root    a. 09/2013: 4.2cm by echo.   Dysrhythmia    Enlarged prostate without lower urinary tract symptoms (luts)    GERD (gastroesophageal reflux disease)    rare   History of hiatal hernia    Hyperlipidemia    Hypertension    Hypothyroidism    Insomnia    Junctional bradycardia    OA (osteoarthritis)    Obesity    Paroxysmal A-fib (HCC)    a. 09/23/13 a-fib with RVR and hypotensive, requiring emergent cardioversion in ED   Peripheral neuropathy 07/17/2017   Vitamin D  deficiency    Past Surgical History:  Procedure Laterality Date   CARDIOVERSION  2015   CARPAL TUNNEL RELEASE Left 2019   I & D EXTREMITY Left 02/18/2021   Procedure: IRRIGATION AND DEBRIDEMENT KNEE;  Surgeon: Harden Jerona GAILS, MD;  Location: [facility];  Service: Orthopedics;  Laterality: Left;   INGUINAL HERNIA REPAIR Left 05/03/2019   Procedure: HERNIA REPAIR INGUINAL ADULT WITH MESH;  Surgeon: Eletha Boas, MD;  Location: [facility];  Service: General;  Laterality: Left;   INGUINAL HERNIA REPAIR Left 1980   INGUINAL HERNIA REPAIR Right 09/12/2021   Procedure: OPEN REPAIR RIGHT INGUINAL HERNIA WITH MESH;  Surgeon: Eletha Boas, MD;  Location: [facility];  Service: General;  Laterality: Right;   TOTAL KNEE ARTHROPLASTY Left 07/20/2017   Procedure: LEFT TOTAL KNEE ARTHROPLASTY;  Surgeon: Gerome Charleston, MD;  Location: [facility];  Service: Orthopedics;  Laterality: Left;  Adductor Block    TRANSTHORACIC ECHOCARDIOGRAM  09/23/2013   EF 55-60%; normal wall motion. Moderately dilated aortic root and ascending aorta; mild AI; moderate bi-atrial enlargement   Social History   Socioeconomic History   Marital status: Married    Spouse name: Not on file   Number of children: Not on file   Years of education: Not on file   Highest education level: Not on file  Occupational History   Occupation: retired  Tobacco Use   Smoking status: Never    Passive exposure: Never   Smokeless tobacco: Never  Vaping Use   Vaping status: Never Used  Substance and Sexual Activity   Alcohol use: Yes    Alcohol/week: 10.0 standard drinks of alcohol    Types: 10 Glasses of wine per week    Comment: couple of glasses of wine a day most days   Drug use: No   Sexual activity: Not Currently  Other Topics Concern   Not on file  Social History Narrative   Married. Wife is a patient of Dr. Shlomo.   At least one son.   Never smoked. Up to 10 glasses of wine a week   Social Drivers of Health   Tobacco Use: Low Risk (04/17/2024)   Patient History    Smoking Tobacco Use: Never    Smokeless Tobacco Use: Never    Passive Exposure: Never  Financial Resource Strain: Not on file  Food  Insecurity: Not on file  Transportation Needs: Not on file  Physical Activity: Not on file  Stress: Not on file  Social Connections: Not on file  Depression (EYV7-0): Not on file  Alcohol Screen: Not on file  Housing: Not on file  Utilities: Not on file  Health Literacy: Not on file   Family History  Problem Relation Age of Onset   Heart disease Father    Stroke Brother    Heart disease Brother    - negative except otherwise stated in the family history section Allergies[1] Prior to Admission medications  Medication Sig Start Date End Date Taking? Authorizing Provider  AREXVY 120 MCG/0.5ML injection Inject 0.5 mLs into the muscle once. 02/27/24  Yes [provider]  CINNAMON PO Take 2,000 mg by  mouth daily.   Yes [provider]  Coenzyme Q10 (CO Q10) 100 MG CAPS Take 100 mg by mouth daily.   Yes [provider]  cyanocobalamin  1000 MCG tablet Take 1,000 mcg by mouth daily.   Yes [provider]  ELIQUIS  5 MG TABS tablet TAKE 1 TABLET BY MOUTH 2 TIMES DAILY. 03/24/24  Yes Croitoru, Mihai, MD  eszopiclone  (LUNESTA ) 1 MG TABS tablet Take 1 tablet (1 mg total) by mouth at bedtime. Take immediately before bedtime 02/22/21  Yes Singh, Prashant K, MD  levothyroxine  (SYNTHROID ) 50 MCG tablet Take 50 mcg by mouth daily before breakfast.   Yes [provider]  Multiple Vitamin (MULTIVITAMIN WITH MINERALS) TABS Take 1 tablet by mouth daily.   Yes [provider]  OVER THE COUNTER MEDICATION Take 2 capsules by mouth daily. Quietum Plus Memory supplement   Yes [provider]  pyridOXINE (VITAMIN B6) 100 MG tablet Take 100 mg by mouth daily.   Yes [provider]  tamsulosin  (FLOMAX ) 0.4 MG CAPS capsule Take 0.8 mg by mouth daily.   Yes [provider]    - pertinent xrays, CT, MRI studies were reviewed and independently interpreted  Positive ROS: All other systems have been reviewed and were otherwise negative with the exception of those mentioned in the HPI and as above.  Physical Exam: General: No acute distress Cardiovascular: No pedal edema Respiratory: No cyanosis, no use of accessory musculature GI: No organomegaly, abdomen is soft and non-tender Skin: No lesions in the area of chief complaint Neurologic: Sensation intact distally Psychiatric: Patient is at baseline mood and affect Lymphatic: No axillary or cervical lymphadenopathy  MUSCULOSKELETAL:  RLE - strong femoral and DP pulses - 10 lb buck's traction - unable to assess motor and sensory function due to sedation  Assessment: Right femur fx  Plan: - maintain buck's traction for now - prognosis is guarded - ortho will continue to follow  Thank you  for the consult and the opportunity to see Mr. Tom Norton. Ozell Cummins, MD [facility] 10:13 AM        [1]  Allergies Allergen Reactions   Bee Venom Anaphylaxis   Doxycycline  Hyclate Anaphylaxis   Yellow Jacket Venom Anaphylaxis   Atorvastatin Other (See Comments)   Doxycycline  Hyclate     Other Reaction(s): tachycardia/afib   Finasteride     Other Reaction(s): weak urinary stream   Sulfamethoxazole     Other Reaction(s): rash/tachycardia/hypotension   Bactrim [Sulfamethoxazole-Trimethoprim] Hives and Rash   Rivaroxaban  Rash   "

## 2024-04-19 ENCOUNTER — Inpatient Hospital Stay (HOSPITAL_COMMUNITY)

## 2024-04-19 DIAGNOSIS — S0291XA Unspecified fracture of skull, initial encounter for closed fracture: Secondary | ICD-10-CM | POA: Diagnosis not present

## 2024-04-19 DIAGNOSIS — W19XXXA Unspecified fall, initial encounter: Secondary | ICD-10-CM | POA: Diagnosis not present

## 2024-04-19 DIAGNOSIS — S066XAA Traumatic subarachnoid hemorrhage with loss of consciousness status unknown, initial encounter: Secondary | ICD-10-CM

## 2024-04-19 DIAGNOSIS — S065XAA Traumatic subdural hemorrhage with loss of consciousness status unknown, initial encounter: Secondary | ICD-10-CM

## 2024-04-19 DIAGNOSIS — I6340 Cerebral infarction due to embolism of unspecified cerebral artery: Secondary | ICD-10-CM

## 2024-04-19 LAB — CBC
HCT: 23.6 % — ABNORMAL LOW (ref 39.0–52.0)
HCT: 23.2 % — ABNORMAL LOW (ref 39.0–52.0)
Hemoglobin: 8.2 g/dL — ABNORMAL LOW (ref 13.0–17.0)
Hemoglobin: 8.2 g/dL — ABNORMAL LOW (ref 13.0–17.0)
MCH: 31.7 pg (ref 26.0–34.0)
MCH: 32 pg (ref 26.0–34.0)
MCHC: 34.7 g/dL (ref 30.0–36.0)
MCHC: 35.3 g/dL (ref 30.0–36.0)
MCV: 91.1 fL (ref 80.0–100.0)
MCV: 90.6 fL (ref 80.0–100.0)
Platelets: 92 K/uL — ABNORMAL LOW (ref 150–400)
Platelets: 100 K/uL — ABNORMAL LOW (ref 150–400)
RBC: 2.59 MIL/uL — ABNORMAL LOW (ref 4.22–5.81)
RBC: 2.56 MIL/uL — ABNORMAL LOW (ref 4.22–5.81)
RDW: 16.3 % — ABNORMAL HIGH (ref 11.5–15.5)
RDW: 16.2 % — ABNORMAL HIGH (ref 11.5–15.5)
WBC: 9.7 K/uL (ref 4.0–10.5)
WBC: 9.9 K/uL (ref 4.0–10.5)
nRBC: 0 % (ref 0.0–0.2)
nRBC: 0 % (ref 0.0–0.2)

## 2024-04-19 LAB — GLUCOSE, CAPILLARY
Glucose-Capillary: 110 mg/dL — ABNORMAL HIGH (ref 70–99)
Glucose-Capillary: 123 mg/dL — ABNORMAL HIGH (ref 70–99)
Glucose-Capillary: 125 mg/dL — ABNORMAL HIGH (ref 70–99)
Glucose-Capillary: 132 mg/dL — ABNORMAL HIGH (ref 70–99)
Glucose-Capillary: 132 mg/dL — ABNORMAL HIGH (ref 70–99)
Glucose-Capillary: 142 mg/dL — ABNORMAL HIGH (ref 70–99)

## 2024-04-19 LAB — BASIC METABOLIC PANEL WITH GFR
Anion gap: 8 (ref 5–15)
BUN: 26 mg/dL — ABNORMAL HIGH (ref 8–23)
CO2: 20 mmol/L — ABNORMAL LOW (ref 22–32)
Calcium: 8.6 mg/dL — ABNORMAL LOW (ref 8.9–10.3)
Chloride: 114 mmol/L — ABNORMAL HIGH (ref 98–111)
Creatinine, Ser: 1.04 mg/dL (ref 0.61–1.24)
GFR, Estimated: >60 mL/min
Glucose, Bld: 133 mg/dL — ABNORMAL HIGH (ref 70–99)
Potassium: 3.8 mmol/L (ref 3.5–5.1)
Sodium: 142 mmol/L (ref 135–145)

## 2024-04-19 LAB — MAGNESIUM: Magnesium: 2.5 mg/dL — ABNORMAL HIGH (ref 1.7–2.4)

## 2024-04-19 LAB — PHOSPHORUS: Phosphorus: 2.5 mg/dL (ref 2.5–4.6)

## 2024-04-19 MED ORDER — DOCUSATE SODIUM 50 MG/5ML PO LIQD
100.0000 mg | Freq: Two times a day (BID) | ORAL | Status: DC
  Administered 2024-04-19 – 2024-04-29 (×17): 100 mg
  Filled 2024-04-19 (×6): qty 10

## 2024-04-19 MED ORDER — OXYCODONE HCL 5 MG PO TABS
2.5000 mg | ORAL_TABLET | ORAL | Status: DC | PRN
  Filled 2024-04-19: qty 1

## 2024-04-19 MED ORDER — TAMSULOSIN HCL 0.4 MG PO CAPS: 0.8000 mg | ORAL_CAPSULE | Freq: Every day | ORAL | Status: DC

## 2024-04-19 MED ORDER — OXYCODONE HCL 5 MG PO TABS
2.5000 mg | ORAL_TABLET | ORAL | Status: DC | PRN
  Administered 2024-04-19 – 2024-04-20 (×4): 5 mg
  Administered 2024-04-20: 2.5 mg
  Administered 2024-04-20: 5 mg
  Administered 2024-04-21: 2.5 mg
  Administered 2024-04-21 – 2024-04-23 (×3): 5 mg
  Filled 2024-04-19 (×5): qty 1

## 2024-04-19 MED ORDER — METHOCARBAMOL 500 MG PO TABS
500.0000 mg | ORAL_TABLET | Freq: Three times a day (TID) | ORAL | Status: AC
  Administered 2024-04-19 – 2024-04-20 (×3): 500 mg
  Filled 2024-04-19 (×2): qty 1

## 2024-04-19 MED ORDER — LEVOTHYROXINE SODIUM 50 MCG PO TABS
50.0000 ug | ORAL_TABLET | Freq: Every day | ORAL | Status: DC
  Administered 2024-04-20 – 2024-05-05 (×16): 50 ug
  Filled 2024-04-19 (×9): qty 1

## 2024-04-19 MED ORDER — POTASSIUM CHLORIDE 20 MEQ PO PACK
40.0000 meq | PACK | Freq: Once | ORAL | Status: AC
  Administered 2024-04-19: 40 meq
  Filled 2024-04-19: qty 2

## 2024-04-19 MED ORDER — DOXAZOSIN MESYLATE 1 MG PO TABS
1.0000 mg | ORAL_TABLET | Freq: Every day | ORAL | Status: DC
  Administered 2024-04-19: 1 mg
  Filled 2024-04-19 (×2): qty 1

## 2024-04-19 MED ORDER — METHOCARBAMOL 1000 MG/10ML IJ SOLN
500.0000 mg | Freq: Three times a day (TID) | INTRAMUSCULAR | Status: AC
  Administered 2024-04-20: 500 mg via INTRAVENOUS
  Filled 2024-04-19: qty 10

## 2024-04-19 NOTE — Progress Notes (Signed)
 Concern for new stroke on CT scan this morning. Awaiting input from neurosurgery. Will hold off on surgery today. Rescheduled for tomorrow pending neuro clearance.

## 2024-04-19 NOTE — Consult Note (Addendum)
 NEUROLOGY CONSULT NOTE   Date of service: April 19, 2024 Patient Name: Tom Norton MRN:  [id number] DOB:  [birth year] Reason for Consultation: New stroke seen on CT Requesting Provider: Md, Trauma, MD  History of Present Illness  Tom Norton is a 89 y.o. male with a PMHx of BPH, Cardiomegaly, Dilated aortic root, Dysrhythmia, GERD, hiatal hernia, Hyperlipidemia, Hypertension, Hypothyroidism, Insomnia, Junctional bradycardia, OA, Obesity, Paroxysmal A-fib (on Eliquis ), Peripheral neuropathy and Vitamin D  deficiency.who presented to the ED on Thursday evening after a 10 foot fall off of his roof, where he had been cleaning leaves. GCS was 10 on arrival. On initial assessment, he had an angulated fx of right leg and laceration to the back of  his head. Pupils were equal and reactive, but the patient was confused, making repetitive statements. EMS had administered 25mcgs of fentanyl . SBP was 100 systolic. During his trauma evaluation, multiple additional injuries were revealed. Per note from the Trauma team, he has the following injuries: Right femur fracture, SDH/SAH, nondepressed parietoocipital bone fracture, trace left basilar pneumothorax, bilateral rib fractures, thoracic TP fractures, possible T9-10 discoligamentous injury and left psoas hemorrhage without extravasation.   Initial CT head revealed bilateral subdural hemorrhages, multifocal SAH, minimal rightward midline shift and a nondisplaced long segment left skull fracture with an overlying scalp hematoma.  Repeat CT head performed this morning revealed evidence of an acute infarct in the inferior left occipital lobe (4 cm, Left PCA), new relative to the prior scan from the day before. Neurology has been consulted for stroke evaluation.     ROS  Unable to obtain due to lethargy.   Past History   Past Medical History:  Diagnosis Date   BPH (benign prostatic hyperplasia)    Cardiomegaly    Dilated aortic root    a. 09/2013:  4.2cm by echo.   Dysrhythmia    Enlarged prostate without lower urinary tract symptoms (luts)    GERD (gastroesophageal reflux disease)    rare   History of hiatal hernia    Hyperlipidemia    Hypertension    Hypothyroidism    Insomnia    Junctional bradycardia    OA (osteoarthritis)    Obesity    Paroxysmal A-fib (HCC)    a. 09/23/13 a-fib with RVR and hypotensive, requiring emergent cardioversion in ED   Peripheral neuropathy 07/17/2017   Vitamin D  deficiency     Past Surgical History:  Procedure Laterality Date   CARDIOVERSION  2015   CARPAL TUNNEL RELEASE Left 2019   I & D EXTREMITY Left 02/18/2021   Procedure: IRRIGATION AND DEBRIDEMENT KNEE;  Surgeon: Harden Jerona GAILS, MD;  Location: [facility];  Service: Orthopedics;  Laterality: Left;   INGUINAL HERNIA REPAIR Left 05/03/2019   Procedure: HERNIA REPAIR INGUINAL ADULT WITH MESH;  Surgeon: Eletha Boas, MD;  Location: [facility];  Service: General;  Laterality: Left;   INGUINAL HERNIA REPAIR Left 1980   INGUINAL HERNIA REPAIR Right 09/12/2021   Procedure: OPEN REPAIR RIGHT INGUINAL HERNIA WITH MESH;  Surgeon: Eletha Boas, MD;  Location: [facility];  Service: General;  Laterality: Right;   TOTAL KNEE ARTHROPLASTY Left 07/20/2017   Procedure: LEFT TOTAL KNEE ARTHROPLASTY;  Surgeon: Gerome Charleston, MD;  Location: [facility];  Service: Orthopedics;  Laterality: Left;  Adductor Block   TRANSTHORACIC ECHOCARDIOGRAM  09/23/2013   EF 55-60%; normal wall motion. Moderately dilated aortic root and ascending aorta; mild AI; moderate bi-atrial enlargement    Family History: Family History  Problem Relation Age of  Onset   Heart disease Father    Stroke Brother    Heart disease Brother     Social History  reports that he has never smoked. He has never been exposed to tobacco smoke. He has never used smokeless tobacco. He reports current alcohol use of about 10.0 standard drinks of alcohol per week. He reports that he does not use  drugs.  Allergies[1]  Medications  Current Medications[2]  Vitals   Vitals:   11-May-2024 0615 05/11/2024 0630 2024/05/11 0645 May 11, 2024 0700  BP:    (!) 131/58  Pulse: 84 80 78 79  Resp: 13 15 15 18   Temp:      TempSrc:      SpO2: 96% 97% 97% 96%  Weight:        Body mass index is 31.8 kg/m.   Physical Exam   Constitutional: Appears well-developed and well-nourished.  Psych: Pained affect  Eyes: No scleral injection.  HENT: No OP obstruction.  Head: Normocephalic. Laceration to back of head.  Respiratory: Effort normal, non-labored breathing.  Skin: No rash to face or distal BUE.  Ext: RLE in traction  Neurologic Examination   Mental Status: Lethargic. Able to nod head yes when told that he fell off of his roof. He moans softly and has a pained affect, but is not providing verbal answers to questions and is not following commands. Does make brief eye contact with examiner. Opens eyes more widely and becomes more alert when wife calls his name.  Cranial Nerves: II: PERRL. Glances briefly at examiner and at wife.   III,IV, VI: No ptosis. Eyes are conjugate. Directs gaze to the right and left in response to voice.  V: Reacts to touch bilaterally VII: Grimace symmetric VIII: Hearing intact to voice IX,X: Gag reflex deferred XI: Symmetric Motor/Sensory: Deferred detailed strength testing due to polytrauma. Moves right and left lower extremity slightly to thigh-pinch. Moves BUE to touch. No gross asymmetry noted, but right leg fracture in traction limits assessment.  Deep Tendon Reflexes: Deferred due to polytrauma Cerebellar: Deferred Gait: Unable to assess  Labs/Imaging/Neurodiagnostic studies   CBC:  Recent Labs  Lab May 11, 2024 0114 2024-05-11 0527  WBC 9.7 9.9  HGB 8.2* 8.2*  HCT 23.6* 23.2*  MCV 91.1 90.6  PLT 92* 100*   Basic Metabolic Panel:  Lab Results  Component Value Date   NA 142 05-11-24   K 3.8 05-11-2024   CO2 20 (L) 05/11/2024   GLUCOSE 133 (H)  11-May-2024   BUN 26 (H) 2024/05/11   CREATININE 1.04 May 11, 2024   CALCIUM  8.6 (L) May 11, 2024   GFRNONAA >60 05/11/2024   GFRAA >60 05/04/2019   Lipid Panel: No results found for: LDLCALC HgbA1c:  Lab Results  Component Value Date   HGBA1C 5.4 04/18/2024   Urine Drug Screen: No results found for: LABOPIA, COCAINSCRNUR, LABBENZ, AMPHETMU, THCU, LABBARB  Alcohol Level     Component Value Date/Time   [facility] <15 04/17/2024 1648   INR  Lab Results  Component Value Date   INR 1.3 (H) 04/17/2024   APTT  Lab Results  Component Value Date   APTT 32 07/20/2017     ASSESSMENT  Tom Norton is a 89 y.o. male with an acute left occipital lobe ischemic stroke in the setting of polytrauma, including multiple intracranial hemorrhages. - Exam reveals a frail-appearing elderly male who is lethargic. Able to nod head yes when told that he fell off of his roof. He moans softly and has a pained affect,  but is not providing verbal answers to questions and is not following commands. Does make brief eye contact with examiner. Opens eyes more widely and becomes more alert when wife calls his name. Able to glance briefly at wife and examiner. Moves all 4 extremities slightly to stimuli, with no gross asymmetry noted.  - CT head: Evidence now of acute infarct in the inferior left occipital lobe (4 cm, Left PCA). No associated hemorrhage there. No regional mass effect. Superimposed multi-spatial bilateral posttraumatic intracranial hemorrhage is stable to slightly regressed: Bilateral SDH (left side 5 to 6 mm, right side 4 to 5 mm), SAH, and perhaps minimal stable hemorrhagic contusion (left temporal lobe). Trace rightward midline shift has decreased. No new intracranial mass effect. Stable nondisplaced long-segment left skull fracture. - Impression:  - Status-post fall from a height of 10 feet onto a wooden surface with multiple traumatic injuries including multifocal SAH and bilateral SDH.  CT reveals a new subacute ischemic stroke in the inferior left occipital lobe.  - DDx for the mechanism of the subacute stroke includes vasospasm secondary to [facility], fat embolism due to traumatic bone fractures and cardioembolic or atherothrombotic. There is also some possibility of a traumatic contusion as the etiology.  - Given his stroke, there is some increased risk for perioperative stroke regarding planned orthopedic procedure for his RLE fracture. However, the benefits of immediate surgical intervention for long term mobility and prevention of complications due to pain and immobility most likely outweigh the risks.   RECOMMENDATIONS  - On Keppra  500 mg BID for seizure prophylaxis in the setting of subdural hematomas and scattered SAH.  - Consider starting nimodipine for prevention of vasospasm, but would discuss with Neurosurgery first.  - TTE - CTA of head and neck - MRI brain when able - Cardiac telemetry - Hold off on antiplatelets for now due to [facility], SDH and planned orthopedic procedure - BP management per standard protocol for SAH - HgbA1c, fasting lipid panel - PT consult, OT consult, Speech consult - Frequent neuro checks - NPO until passes stroke swallow screen - Stroke Team to follow tomorrow morning  ______________________________________________________________________    Bonney SHARK, Simcha Speir, MD [facility]     [1]  Allergies Allergen Reactions   Bee Venom Anaphylaxis   Doxycycline  Hyclate Anaphylaxis   Yellow Jacket Venom Anaphylaxis   Atorvastatin Other (See Comments)   Doxycycline  Hyclate     Other Reaction(s): tachycardia/afib   Finasteride     Other Reaction(s): weak urinary stream   Sulfamethoxazole     Other Reaction(s): rash/tachycardia/hypotension   Bactrim [Sulfamethoxazole-Trimethoprim] Hives and Rash   Rivaroxaban  Rash  [2]  Current Facility-Administered Medications:    0.9 %  sodium chloride  infusion (Manually program via  Guardrails IV Fluids), , Intravenous, Once, Schlossman, Erin, MD   0.9 %  sodium chloride  infusion, , Intravenous, Continuous, Lovick, Dreama SAILOR, MD, Last Rate: 75 mL/hr at 04/19/24 0700, Infusion Verify at 04/19/24 0700   Chlorhexidine  Gluconate Cloth 2 % PADS 6 each, 6 each, Topical, Q0600, Ann Fine, MD, 6 each at 04/18/24 1243   docusate sodium  (COLACE) capsule 100 mg, 100 mg, Oral, BID, Ann Fine, MD   hydrALAZINE  (APRESOLINE ) injection 10 mg, 10 mg, Intravenous, Q1H PRN, Darnella Dorn SAUNDERS, MD, 10 mg at 04/19/24 0153   HYDROmorphone  (DILAUDID ) injection 0.5 mg, 0.5 mg, Intravenous, Q1H PRN, Ann Fine, MD, 0.5 mg at 04/19/24 0525   insulin  aspart (novoLOG ) injection 0-9 Units, 0-9 Units, Subcutaneous, Q4H, Lovick, Dreama SAILOR, MD, 1 Units  at 04/19/24 0350   levETIRAcetam  (KEPPRA ) undiluted injection 500 mg, 500 mg, Intravenous, Q12H, Guillermina Hamilton, MD, 500 mg at 04/19/24 9389   levothyroxine  (SYNTHROID ) tablet 50 mcg, 50 mcg, Oral, QAC breakfast, Paola Dreama SAILOR, MD, 50 mcg at 04/19/24 9389   methocarbamol  (ROBAXIN ) tablet 500 mg, 500 mg, Oral, Q8H **OR** methocarbamol  (ROBAXIN ) injection 500 mg, 500 mg, Intravenous, Q8H, Ann Fine, MD, 500 mg at 04/19/24 9650   metoprolol  tartrate (LOPRESSOR ) tablet 25 mg, 25 mg, Per Tube, BID, Paola Dreama SAILOR, MD, 25 mg at 04/18/24 2141   nicardipine  (CARDENE ) 20mg  in 0.86% saline IV infusion (0.1 mg/ml), 3-15 mg/hr, Intravenous, Continuous, Lovick, Dreama SAILOR, MD, Stopped at 04/19/24 0355   norepinephrine  (LEVOPHED ) 4mg  in (0.016 mg/mL) premix infusion, 0-40 mcg/min, Intravenous, Continuous, Dreama Longs, MD, Stopped at 04/18/24 1025   ondansetron  (ZOFRAN -ODT) disintegrating tablet 4 mg, 4 mg, Oral, Q6H PRN **OR** ondansetron  (ZOFRAN ) injection 4 mg, 4 mg, Intravenous, Q6H PRN, Ann Fine, MD   Oral care mouth rinse, 15 mL, Mouth Rinse, PRN, Ann Fine, MD   oxyCODONE  (Oxy IR/ROXICODONE ) immediate release  tablet 2.5-5 mg, 2.5-5 mg, Oral, Q4H PRN, Paola Dreama SAILOR, MD   polyethylene glycol (MIRALAX  / GLYCOLAX ) packet 17 g, 17 g, Oral, Daily PRN, Ann Fine, MD   sodium chloride  flush (NS) 0.9 % injection 10-40 mL, 10-40 mL, Intracatheter, Q12H, Ann Fine, MD, 20 mL at 04/18/24 2142   sodium chloride  flush (NS) 0.9 % injection 10-40 mL, 10-40 mL, Intracatheter, PRN, Ann Fine, MD   tamsulosin  (FLOMAX ) capsule 0.8 mg, 0.8 mg, Oral, Daily, Lovick, Dreama SAILOR, MD

## 2024-04-19 NOTE — Progress Notes (Signed)
 SLP Cancellation Note  Patient Details Name: JUWON SCRIPTER MRN: [id number] DOB: 03/21/1930   Cancelled treatment:       Reason Eval/Treat Not Completed: Patient's level of consciousness Per RN, he has not been consistently alert and is getting medications for pain. SLP will follow for readiness.  [name] IVAR Blase, MA, CCC-SLP Speech Therapy  04/19/2024, 2:07 PM

## 2024-04-19 NOTE — Plan of Care (Signed)
" °  Problem: Clinical Measurements: Goal: Will remain free from infection Outcome: Progressing Goal: Diagnostic test results will improve Outcome: Progressing Goal: Respiratory complications will improve Outcome: Progressing Goal: Cardiovascular complication will be avoided Outcome: Progressing   Problem: Activity: Goal: Risk for activity intolerance will decrease Outcome: Progressing   Problem: Elimination: Goal: Will not experience complications related to urinary retention Outcome: Progressing   Problem: Pain Managment: Goal: General experience of comfort will improve and/or be controlled Outcome: Progressing   Problem: Safety: Goal: Ability to remain free from injury will improve Outcome: Progressing   Problem: Skin Integrity: Goal: Risk for impaired skin integrity will decrease Outcome: Progressing   Problem: Fluid Volume: Goal: Ability to maintain a balanced intake and output will improve Outcome: Progressing   Problem: Skin Integrity: Goal: Risk for impaired skin integrity will decrease Outcome: Progressing   Problem: Tissue Perfusion: Goal: Adequacy of tissue perfusion will improve Outcome: Progressing   Problem: Education: Goal: Knowledge of General Education information will improve Description: Including pain rating scale, medication(s)/side effects and non-pharmacologic comfort measures Outcome: Not Progressing   Problem: Health Behavior/Discharge Planning: Goal: Ability to manage health-related needs will improve Outcome: Not Progressing   Problem: Clinical Measurements: Goal: Ability to maintain clinical measurements within normal limits will improve Outcome: Not Progressing   Problem: Nutrition: Goal: Adequate nutrition will be maintained Outcome: Not Progressing   Problem: Coping: Goal: Level of anxiety will decrease Outcome: Not Progressing   Problem: Elimination: Goal: Will not experience complications related to bowel motility Outcome:  Not Progressing   Problem: Safety: Goal: Non-violent Restraint(s) Outcome: Not Progressing   Problem: Education: Goal: Ability to describe self-care measures that may prevent or decrease complications (Diabetes Survival Skills Education) will improve Outcome: Not Progressing Goal: Individualized Educational Video(s) Outcome: Not Progressing   Problem: Coping: Goal: Ability to adjust to condition or change in health will improve Outcome: Not Progressing   Problem: Health Behavior/Discharge Planning: Goal: Ability to identify and utilize available resources and services will improve Outcome: Not Progressing Goal: Ability to manage health-related needs will improve Outcome: Not Progressing   Problem: Metabolic: Goal: Ability to maintain appropriate glucose levels will improve Outcome: Not Progressing   Problem: Nutritional: Goal: Maintenance of adequate nutrition will improve Outcome: Not Progressing Goal: Progress toward achieving an optimal weight will improve Outcome: Not Progressing   "

## 2024-04-19 NOTE — Progress Notes (Signed)
 "  Trauma/Critical Care Follow Up Note  Subjective:    Overnight Issues:   Objective:  Vital signs for last 24 hours: Temp:  [98.4 F (36.9 C)-99.7 F (37.6 C)] 99.6 F (37.6 C) (12/27 0400) Pulse Rate:  [68-98] 83 (12/27 0800) Resp:  [13-27] 18 (12/27 0800) BP: (126-162)/(57-87) 130/64 (12/27 0800) SpO2:  [92 %-98 %] 96 % (12/27 0800) Arterial Line BP: (108-255)/(43-193) 143/52 (12/27 0800)  Intake/Output from previous day: 12/26 0701 - 12/27 0700 In: 3152.3 [I.V.:2511.5; IV Piggyback:640.7] Out: 978 [Urine:978]  Intake/Output this shift: Total I/O In: -  Out: 150 [Urine:150]  Vent settings for last 24 hours:    Physical Exam:  Gen: comfortable, no distress Neuro: follows commands for nursing HEENT: PERRL Neck: supple CV: RRR Pulm: unlabored breathing on [place] Abd: soft, NT  , no recent BM GU: urine clear and yellow, +spontaneous voids Extr: wwp, no edema  Results for orders placed or performed during the hospital encounter of 04/17/24 (from the past 24 hours)  CBC     Status: Abnormal   Collection Time: 04/18/24 12:20 PM  Result Value Ref Range   WBC 8.5 4.0 - 10.5 K/uL   RBC 2.80 (L) 4.22 - 5.81 MIL/uL   Hemoglobin 8.7 (L) 13.0 - 17.0 g/dL   HCT 74.4 (L) 60.9 - 47.9 %   MCV 91.1 80.0 - 100.0 fL   MCH 31.1 26.0 - 34.0 pg   MCHC 34.1 30.0 - 36.0 g/dL   RDW 83.7 (H) 88.4 - 84.4 %   Platelets 87 (L) 150 - 400 K/uL   nRBC 0.0 0.0 - 0.2 %  Hemoglobin A1c     Status: None   Collection Time: 04/18/24  2:13 PM  Result Value Ref Range   Hgb A1c MFr Bld 5.4 4.8 - 5.6 %   Mean Plasma Glucose 108.28 mg/dL  Glucose, capillary     Status: Abnormal   Collection Time: 04/18/24  4:28 PM  Result Value Ref Range   Glucose-Capillary 129 (H) 70 - 99 mg/dL  CBC     Status: Abnormal   Collection Time: 04/18/24  7:20 PM  Result Value Ref Range   WBC 10.2 4.0 - 10.5 K/uL   RBC 2.71 (L) 4.22 - 5.81 MIL/uL   Hemoglobin 8.7 (L) 13.0 - 17.0 g/dL   HCT 75.3 (L) 60.9 - 47.9 %    MCV 90.8 80.0 - 100.0 fL   MCH 32.1 26.0 - 34.0 pg   MCHC 35.4 30.0 - 36.0 g/dL   RDW 83.7 (H) 88.4 - 84.4 %   Platelets 90 (L) 150 - 400 K/uL   nRBC 0.0 0.0 - 0.2 %  Glucose, capillary     Status: Abnormal   Collection Time: 04/18/24  7:47 PM  Result Value Ref Range   Glucose-Capillary 134 (H) 70 - 99 mg/dL  CBC     Status: Abnormal   Collection Time: 04/18/24  9:43 PM  Result Value Ref Range   WBC 10.3 4.0 - 10.5 K/uL   RBC 2.67 (L) 4.22 - 5.81 MIL/uL   Hemoglobin 8.5 (L) 13.0 - 17.0 g/dL   HCT 76.0 (L) 60.9 - 47.9 %   MCV 89.5 80.0 - 100.0 fL   MCH 31.8 26.0 - 34.0 pg   MCHC 35.6 30.0 - 36.0 g/dL   RDW 83.9 (H) 88.4 - 84.4 %   Platelets 92 (L) 150 - 400 K/uL   nRBC 0.0 0.0 - 0.2 %  Glucose, capillary  Status: Abnormal   Collection Time: 04/18/24 11:18 PM  Result Value Ref Range   Glucose-Capillary 124 (H) 70 - 99 mg/dL  CBC     Status: Abnormal   Collection Time: 04/19/24  1:14 AM  Result Value Ref Range   WBC 9.7 4.0 - 10.5 K/uL   RBC 2.59 (L) 4.22 - 5.81 MIL/uL   Hemoglobin 8.2 (L) 13.0 - 17.0 g/dL   HCT 76.3 (L) 60.9 - 47.9 %   MCV 91.1 80.0 - 100.0 fL   MCH 31.7 26.0 - 34.0 pg   MCHC 34.7 30.0 - 36.0 g/dL   RDW 83.6 (H) 88.4 - 84.4 %   Platelets 92 (L) 150 - 400 K/uL   nRBC 0.0 0.0 - 0.2 %  Glucose, capillary     Status: Abnormal   Collection Time: 04/19/24  3:16 AM  Result Value Ref Range   Glucose-Capillary 132 (H) 70 - 99 mg/dL  CBC     Status: Abnormal   Collection Time: 04/19/24  5:27 AM  Result Value Ref Range   WBC 9.9 4.0 - 10.5 K/uL   RBC 2.56 (L) 4.22 - 5.81 MIL/uL   Hemoglobin 8.2 (L) 13.0 - 17.0 g/dL   HCT 76.7 (L) 60.9 - 47.9 %   MCV 90.6 80.0 - 100.0 fL   MCH 32.0 26.0 - 34.0 pg   MCHC 35.3 30.0 - 36.0 g/dL   RDW 83.7 (H) 88.4 - 84.4 %   Platelets 100 (L) 150 - 400 K/uL   nRBC 0.0 0.0 - 0.2 %  Basic metabolic panel     Status: Abnormal   Collection Time: 04/19/24  5:27 AM  Result Value Ref Range   Sodium 142 135 - 145 mmol/L    Potassium 3.8 3.5 - 5.1 mmol/L   Chloride 114 (H) 98 - 111 mmol/L   CO2 20 (L) 22 - 32 mmol/L   Glucose, Bld 133 (H) 70 - 99 mg/dL   BUN 26 (H) 8 - 23 mg/dL   Creatinine, Ser 8.95 0.61 - 1.24 mg/dL   Calcium  8.6 (L) 8.9 - 10.3 mg/dL   GFR, Estimated >39 >39 mL/min   Anion gap 8 5 - 15  Magnesium      Status: Abnormal   Collection Time: 04/19/24  5:27 AM  Result Value Ref Range   Magnesium  2.5 (H) 1.7 - 2.4 mg/dL  Phosphorus     Status: None   Collection Time: 04/19/24  5:27 AM  Result Value Ref Range   Phosphorus 2.5 2.5 - 4.6 mg/dL  Glucose, capillary     Status: Abnormal   Collection Time: 04/19/24  7:55 AM  Result Value Ref Range   Glucose-Capillary 110 (H) 70 - 99 mg/dL    Assessment & Plan:  LOS: 2 days   Additional comments:I reviewed the patient's new clinical lab test results.   and I reviewed the patients new imaging test results.    89 y.o. male s/p fall   Known Injuries: - Right femur fracture - SDH/SAH - Nondepressed parietoocipital bone fracture - Trace left basilar pneumothorax - Bilateral rib fractures - Thoracic TP fractures - Possible T9-10 discoligamentous injury - Left psoas hemorrhage without extravasation   Plan  - Admit to trauma service - Appreciate neurosurgery recommendations, Dr. Garst              - MRI thoracic spine when able              - Repeat CTH at 0600 with stable  ICH, but new occipital stroke, Dr. Merrianne consulted  - [phone] neuro check - Appreciate orthopaedics recommendations, Dr. Jerri, Dr. Kendal              - Traction              - okay for OR today from my perspective, will need to get input from NSGY as well - FEN - strict NPO, place cortrak, SLP eval - DVT - SCDs, hold chemical ppx due to bleeding concerns - Dispo - ICU, clinical update provided to wife at bedside this AM. Family requested transfer to [facility] yesterday, which I initiated, and he was initially accepted and then rescinded. Continue current care.   Critical Care  Total Time: 35 minutes  Dreama GEANNIE Hanger, MD Trauma & General Surgery Please use [email or web address] to contact on call provider  04/19/2024  *Care during the described time interval was provided by me. I have reviewed this patient's available data, including medical history, events of note, physical examination and test results as part of my evaluation.    "

## 2024-04-19 NOTE — Progress Notes (Signed)
 Per Dr. Colon and Dr. Merrianne, okay to delay MRI until pt has undergone surgery w orthopedics and is no longer in traction.   Pharmacy notified that tamsulosin  cannot be given per tube. Per Dr. Polly, okay to hold medication for now.

## 2024-04-19 NOTE — Progress Notes (Signed)
 Patient ID: Tom Norton, male   DOB: 12/06/1929, 89 y.o.   MRN: [id number] Vital signs are stable Patient opening eyes and responding CT scan from this morning shows good stability with intracranial contusions without much shift or mass effect From my perspective he is okay to undergo general anesthesia to repair his femur.  MRI of the thoracic [place] spine can be performed after he is easily movable and out of traction.  Supportive care for now.  I would defer decision regarding orthopedic surgery to the orthopedists in their schedule inconveniences.

## 2024-04-20 ENCOUNTER — Inpatient Hospital Stay (HOSPITAL_COMMUNITY)

## 2024-04-20 DIAGNOSIS — I63432 Cerebral infarction due to embolism of left posterior cerebral artery: Secondary | ICD-10-CM

## 2024-04-20 DIAGNOSIS — S0291XA Unspecified fracture of skull, initial encounter for closed fracture: Secondary | ICD-10-CM | POA: Diagnosis not present

## 2024-04-20 DIAGNOSIS — R0689 Other abnormalities of breathing: Secondary | ICD-10-CM

## 2024-04-20 DIAGNOSIS — W132XXA Fall from, out of or through roof, initial encounter: Secondary | ICD-10-CM | POA: Diagnosis not present

## 2024-04-20 DIAGNOSIS — E669 Obesity, unspecified: Secondary | ICD-10-CM

## 2024-04-20 DIAGNOSIS — I69391 Dysphagia following cerebral infarction: Secondary | ICD-10-CM

## 2024-04-20 DIAGNOSIS — Z6831 Body mass index (BMI) 31.0-31.9, adult: Secondary | ICD-10-CM

## 2024-04-20 DIAGNOSIS — S065XAA Traumatic subdural hemorrhage with loss of consciousness status unknown, initial encounter: Secondary | ICD-10-CM | POA: Diagnosis not present

## 2024-04-20 DIAGNOSIS — S066XAA Traumatic subarachnoid hemorrhage with loss of consciousness status unknown, initial encounter: Secondary | ICD-10-CM | POA: Diagnosis not present

## 2024-04-20 DIAGNOSIS — I10 Essential (primary) hypertension: Secondary | ICD-10-CM | POA: Diagnosis not present

## 2024-04-20 DIAGNOSIS — I4891 Unspecified atrial fibrillation: Secondary | ICD-10-CM | POA: Diagnosis not present

## 2024-04-20 LAB — POCT I-STAT 7, (LYTES, BLD GAS, ICA,H+H)
Acid-base deficit: 6 mmol/L — ABNORMAL HIGH (ref 0.0–2.0)
Acid-base deficit: 5 mmol/L — ABNORMAL HIGH (ref 0.0–2.0)
Acid-base deficit: 4 mmol/L — ABNORMAL HIGH (ref 0.0–2.0)
Bicarbonate: 18.7 mmol/L — ABNORMAL LOW (ref 20.0–28.0)
Bicarbonate: 18.9 mmol/L — ABNORMAL LOW (ref 20.0–28.0)
Bicarbonate: 18.8 mmol/L — ABNORMAL LOW (ref 20.0–28.0)
Calcium, Ion: 1.29 mmol/L (ref 1.15–1.40)
Calcium, Ion: 1.27 mmol/L (ref 1.15–1.40)
Calcium, Ion: 1.27 mmol/L (ref 1.15–1.40)
HCT: 18 % — ABNORMAL LOW (ref 39.0–52.0)
HCT: 17 % — ABNORMAL LOW (ref 39.0–52.0)
HCT: 24 % — ABNORMAL LOW (ref 39.0–52.0)
Hemoglobin: 6.1 g/dL — CL (ref 13.0–17.0)
Hemoglobin: 5.8 g/dL — CL (ref 13.0–17.0)
Hemoglobin: 8.2 g/dL — ABNORMAL LOW (ref 13.0–17.0)
O2 Saturation: 90 %
O2 Saturation: 94 %
O2 Saturation: 95 %
Patient temperature: 98.7
Patient temperature: 98
Patient temperature: 37.1
Potassium: 3.9 mmol/L (ref 3.5–5.1)
Potassium: 3.6 mmol/L (ref 3.5–5.1)
Potassium: 3.2 mmol/L — ABNORMAL LOW (ref 3.5–5.1)
Sodium: 145 mmol/L (ref 135–145)
Sodium: 146 mmol/L — ABNORMAL HIGH (ref 135–145)
Sodium: 148 mmol/L — ABNORMAL HIGH (ref 135–145)
TCO2: 20 mmol/L — ABNORMAL LOW (ref 22–32)
TCO2: 20 mmol/L — ABNORMAL LOW (ref 22–32)
TCO2: 20 mmol/L — ABNORMAL LOW (ref 22–32)
pCO2 arterial: 31.3 mmHg — ABNORMAL LOW (ref 32–48)
pCO2 arterial: 29.3 mmHg — ABNORMAL LOW (ref 32–48)
pCO2 arterial: 26.9 mmHg — ABNORMAL LOW (ref 32–48)
pH, Arterial: 7.385 (ref 7.35–7.45)
pH, Arterial: 7.416 (ref 7.35–7.45)
pH, Arterial: 7.454 — ABNORMAL HIGH (ref 7.35–7.45)
pO2, Arterial: 58 mmHg — ABNORMAL LOW (ref 83–108)
pO2, Arterial: 66 mmHg — ABNORMAL LOW (ref 83–108)
pO2, Arterial: 69 mmHg — ABNORMAL LOW (ref 83–108)

## 2024-04-20 LAB — PREPARE RBC (CROSSMATCH)

## 2024-04-20 LAB — CBC
HCT: 21.2 % — ABNORMAL LOW (ref 39.0–52.0)
Hemoglobin: 7.2 g/dL — ABNORMAL LOW (ref 13.0–17.0)
MCH: 32.3 pg (ref 26.0–34.0)
MCHC: 34 g/dL (ref 30.0–36.0)
MCV: 95.1 fL (ref 80.0–100.0)
Platelets: 96 K/uL — ABNORMAL LOW (ref 150–400)
RBC: 2.23 MIL/uL — ABNORMAL LOW (ref 4.22–5.81)
RDW: 16.3 % — ABNORMAL HIGH (ref 11.5–15.5)
WBC: 8.4 K/uL (ref 4.0–10.5)
nRBC: 0.2 % (ref 0.0–0.2)

## 2024-04-20 LAB — BASIC METABOLIC PANEL WITH GFR
Anion gap: 7 (ref 5–15)
BUN: 31 mg/dL — ABNORMAL HIGH (ref 8–23)
CO2: 20 mmol/L — ABNORMAL LOW (ref 22–32)
Calcium: 8.2 mg/dL — ABNORMAL LOW (ref 8.9–10.3)
Chloride: 118 mmol/L — ABNORMAL HIGH (ref 98–111)
Creatinine, Ser: 0.99 mg/dL (ref 0.61–1.24)
GFR, Estimated: >60 mL/min
Glucose, Bld: 133 mg/dL — ABNORMAL HIGH (ref 70–99)
Potassium: 3.9 mmol/L (ref 3.5–5.1)
Sodium: 145 mmol/L (ref 135–145)

## 2024-04-20 LAB — PHOSPHORUS: Phosphorus: 2.1 mg/dL — ABNORMAL LOW (ref 2.5–4.6)

## 2024-04-20 LAB — LIPID PANEL
Cholesterol: 104 mg/dL (ref 0–200)
HDL: 40 mg/dL — ABNORMAL LOW
LDL Cholesterol: 49 mg/dL (ref 0–99)
Total CHOL/HDL Ratio: 2.6 ratio
Triglycerides: 76 mg/dL
VLDL: 15 mg/dL (ref 0–40)

## 2024-04-20 LAB — GLUCOSE, CAPILLARY
Glucose-Capillary: 133 mg/dL — ABNORMAL HIGH (ref 70–99)
Glucose-Capillary: 116 mg/dL — ABNORMAL HIGH (ref 70–99)
Glucose-Capillary: 143 mg/dL — ABNORMAL HIGH (ref 70–99)
Glucose-Capillary: 122 mg/dL — ABNORMAL HIGH (ref 70–99)
Glucose-Capillary: 124 mg/dL — ABNORMAL HIGH (ref 70–99)

## 2024-04-20 LAB — HEMOGLOBIN AND HEMATOCRIT, BLOOD
HCT: 21.3 % — ABNORMAL LOW (ref 39.0–52.0)
HCT: 25.2 % — ABNORMAL LOW (ref 39.0–52.0)
Hemoglobin: 7 g/dL — ABNORMAL LOW (ref 13.0–17.0)
Hemoglobin: 8.5 g/dL — ABNORMAL LOW (ref 13.0–17.0)

## 2024-04-20 LAB — MAGNESIUM: Magnesium: 2.4 mg/dL (ref 1.7–2.4)

## 2024-04-20 MED ORDER — DOXAZOSIN MESYLATE 2 MG PO TABS
2.0000 mg | ORAL_TABLET | Freq: Every day | ORAL | Status: DC
  Administered 2024-04-21: 2 mg

## 2024-04-20 MED ORDER — DOXAZOSIN MESYLATE 4 MG PO TABS
4.0000 mg | ORAL_TABLET | Freq: Every day | ORAL | Status: DC
  Administered 2024-04-20: 4 mg

## 2024-04-20 MED ORDER — DOXAZOSIN MESYLATE 4 MG PO TABS
4.0000 mg | ORAL_TABLET | Freq: Every day | ORAL | Status: DC
  Filled 2024-04-20: qty 1

## 2024-04-20 MED ORDER — SODIUM CHLORIDE 0.9 % IV SOLN: INTRAVENOUS | Status: AC

## 2024-04-20 MED ORDER — FUROSEMIDE 10 MG/ML IJ SOLN
40.0000 mg | Freq: Once | INTRAMUSCULAR | Status: AC
  Administered 2024-04-20: 40 mg via INTRAVENOUS
  Filled 2024-04-20: qty 4

## 2024-04-20 MED ORDER — CEFAZOLIN SODIUM-DEXTROSE 2-4 GM/100ML-% IV SOLN
2.0000 g | Freq: Once | INTRAVENOUS | Status: AC
  Administered 2024-04-20: 2 g via INTRAVENOUS
  Filled 2024-04-20: qty 100

## 2024-04-20 MED ORDER — ACETAMINOPHEN 160 MG/5ML PO SOLN
650.0000 mg | Freq: Four times a day (QID) | ORAL | Status: DC
  Administered 2024-04-20 – 2024-04-21 (×4): 650 mg via ORAL
  Filled 2024-04-20 (×2): qty 20.3

## 2024-04-20 MED ORDER — POTASSIUM CHLORIDE 10 MEQ/100ML IV SOLN
10.0000 meq | INTRAVENOUS | Status: AC
  Administered 2024-04-20 – 2024-04-21 (×4): 10 meq via INTRAVENOUS
  Filled 2024-04-20: qty 100

## 2024-04-20 MED ORDER — SODIUM CHLORIDE 0.9% IV SOLUTION: Freq: Once | INTRAVENOUS | Status: AC

## 2024-04-20 MED ORDER — POTASSIUM CHLORIDE 20 MEQ PO PACK
40.0000 meq | PACK | Freq: Once | ORAL | Status: DC
  Filled 2024-04-20: qty 2

## 2024-04-20 NOTE — Progress Notes (Signed)
 Patient ID: Tom Norton, male   DOB: 26-Mar-1930, 89 y.o.   MRN: [id number] Patient with declining exam primarily medically related increased work of breathing question aspiration versus atelectasis.  Nonfocal exam but minimally responsive.  CT scan stable with small contusions but no mass effect do not feel this is primarily intracranially based.  Patient I think is probably not a candidate for surgery but will defer to critical care and orthopedics.

## 2024-04-20 NOTE — Progress Notes (Addendum)
 Per family request, plan for OR with Dr. Jerri on Monday for ORIF of the R femur fracture if medically clear. Risks and benefits were thoroughly discussed with the patient's son, Levander Katzenstein.

## 2024-04-20 NOTE — Progress Notes (Signed)
 Trauma Event Note  Pt had significant decline in neuro and respiratory status this morning after pt was taken down for CT and had to lie flat. 100% NRB was placed on pt and has since not been able to be removed.   Dr Signe came to bedside at 0805 to assess pt and speak with pt's wife about plan of care.  CXR performed revealing atelectasis vs infiltrate.  40mg  Lasix  given IV.  1015: Desat to 86 and increased WOB. Bipap initiated at this time.  O2 saturation much better at 100%.   1040: Pt's wife and son at bedside and have been updated on plan of care.   Last imported Vital Signs BP 134/69   Pulse 88   Temp 98.7 F (37.1 C) (Axillary)   Resp 20   Wt 197 lb (89.4 kg)   SpO2 94%   BMI 31.80 kg/m   Trending CBC Recent Labs    04/19/24 0114 04/19/24 0527 04/20/24 0551 04/20/24 0814 04/20/24 0904  WBC 9.7 9.9 8.4  --   --   HGB 8.2* 8.2* 7.2* 6.1* 7.0*  HCT 23.6* 23.2* 21.2* 18.0* 21.3*  PLT 92* 100* 96*  --   --     Trending Coag's Recent Labs    04/17/24 1648  INR 1.3*    Trending BMET Recent Labs    04/18/24 0327 04/19/24 0527 04/20/24 0551 04/20/24 0814  NA 135 142 145 145  K 3.7 3.8 3.9 3.9  CL 107 114* 118*  --   CO2 17* 20* 20*  --   BUN 20 26* 31*  --   CREATININE 1.06 1.04 0.99  --   GLUCOSE 402* 133* 133*  --       Jamarious Febo W  Trauma Response RN  Please call TRN at [phone] for further assistance.

## 2024-04-20 NOTE — Progress Notes (Signed)
 Pt desat when he was in CT, had to be placed on non-rebreather,  his Sats improved, D, Metzger notified and oncoming shift made aware.

## 2024-04-20 NOTE — Progress Notes (Addendum)
 "  Trauma/Critical Care Follow Up Note  Subjective:    Overnight Issues: Decreased O2 sats after going down for repeat head CT  Objective:  Vital signs for last 24 hours: Temp:  [99 F (37.2 C)-99.8 F (37.7 C)] 99.2 F (37.3 C) (12/28 0400) Pulse Rate:  [58-108] 71 (12/28 0800) Resp:  [14-30] 23 (12/28 0800) BP: (118-165)/(53-116) 128/65 (12/28 0800) SpO2:  [90 %-97 %] 95 % (12/28 0800) Arterial Line BP: (98-168)/(43-63) 124/43 (12/28 0800)  Intake/Output from previous day: 12/27 0701 - 12/28 0700 In: 2837.8 [I.V.:2827.8; IV Piggyback:10] Out: 1000 [Urine:1000]  Intake/Output this shift: Total I/O In: 75 [I.V.:75] Out: -   Vent settings for last 24 hours:    Physical Exam:  Gen: Appears comfortable, minimally responsive, currently on nonrebreather satting 96 to 97% Neuro: Intermittently following commands, HEENT: PERRL Neck: supple CV: RRR Pulm: unlabored breathing on nonrebreather, currently satting mid 90s Abd: soft, NT  , no recent BM GU: urine clear and yellow, +spontaneous voids Extr: wwp, no edema  Results for orders placed or performed during the hospital encounter of 04/17/24 (from the past 24 hours)  Glucose, capillary     Status: Abnormal   Collection Time: 04/19/24 11:36 AM  Result Value Ref Range   Glucose-Capillary 123 (H) 70 - 99 mg/dL  Glucose, capillary     Status: Abnormal   Collection Time: 04/19/24  4:01 PM  Result Value Ref Range   Glucose-Capillary 125 (H) 70 - 99 mg/dL  Glucose, capillary     Status: Abnormal   Collection Time: 04/19/24  7:45 PM  Result Value Ref Range   Glucose-Capillary 142 (H) 70 - 99 mg/dL  Glucose, capillary     Status: Abnormal   Collection Time: 04/19/24 11:34 PM  Result Value Ref Range   Glucose-Capillary 132 (H) 70 - 99 mg/dL  Glucose, capillary     Status: Abnormal   Collection Time: 04/20/24  3:31 AM  Result Value Ref Range   Glucose-Capillary 133 (H) 70 - 99 mg/dL  CBC     Status: Abnormal   Collection  Time: 04/20/24  5:51 AM  Result Value Ref Range   WBC 8.4 4.0 - 10.5 K/uL   RBC 2.23 (L) 4.22 - 5.81 MIL/uL   Hemoglobin 7.2 (L) 13.0 - 17.0 g/dL   HCT 78.7 (L) 60.9 - 47.9 %   MCV 95.1 80.0 - 100.0 fL   MCH 32.3 26.0 - 34.0 pg   MCHC 34.0 30.0 - 36.0 g/dL   RDW 83.6 (H) 88.4 - 84.4 %   Platelets 96 (L) 150 - 400 K/uL   nRBC 0.2 0.0 - 0.2 %  Basic metabolic panel     Status: Abnormal   Collection Time: 04/20/24  5:51 AM  Result Value Ref Range   Sodium 145 135 - 145 mmol/L   Potassium 3.9 3.5 - 5.1 mmol/L   Chloride 118 (H) 98 - 111 mmol/L   CO2 20 (L) 22 - 32 mmol/L   Glucose, Bld 133 (H) 70 - 99 mg/dL   BUN 31 (H) 8 - 23 mg/dL   Creatinine, Ser 9.00 0.61 - 1.24 mg/dL   Calcium  8.2 (L) 8.9 - 10.3 mg/dL   GFR, Estimated >39 >39 mL/min   Anion gap 7 5 - 15  Magnesium      Status: None   Collection Time: 04/20/24  5:51 AM  Result Value Ref Range   Magnesium  2.4 1.7 - 2.4 mg/dL  Phosphorus     Status: Abnormal  Collection Time: 04/20/24  5:51 AM  Result Value Ref Range   Phosphorus 2.1 (L) 2.5 - 4.6 mg/dL  Glucose, capillary     Status: Abnormal   Collection Time: 04/20/24  7:53 AM  Result Value Ref Range   Glucose-Capillary 116 (H) 70 - 99 mg/dL    Assessment & Plan:  LOS: 3 days   Additional comments:I reviewed the patient's new clinical lab test results.   and I reviewed the patients new imaging test results.    89 y.o. male s/p fall   Known Injuries: - Right femur fracture - SDH/SAH - Nondepressed parietoocipital bone fracture - Trace left basilar pneumothorax - Bilateral rib fractures - Thoracic TP fractures - Possible T9-10 discoligamentous injury - Left psoas hemorrhage without extravasation   Plan  - Admit to trauma service - Appreciate neurosurgery recommendations, Dr. Garst              - MRI thoracic spine when able              - Repeat CTH at 0600 with stable ICH, but new occipital stroke, Dr. Merrianne consulted.  Repeat head CT this morning at  0600 is also stable. - v8y neuro check - Appreciate orthopaedics recommendations, Dr. Jerri, Dr. Kendal              - Traction              - Current plan is for OR tomorrow with Dr. Jerri -Increased oxygen requirement: Chest x-ray this morning preliminarily reviewed, fairly edematous per my review.  ABG 7.38, pCO2 31.2, pO2 57.  Pulmonary toilet as able, check resp cultures, Lasix  40 now, continue close observation.  Per wife okay for endotracheal intubation if necessary. - FEN - strict NPO, place cortrak, SLP eval - DVT - SCDs, hold chemical ppx due to bleeding concerns - Dispo - ICU, clinical update provided to wife at bedside this AM. Family requested transfer to [facility] yesterday, which Dr. Paola initiated, and he was initially accepted and then rescinded. Continue current care.   Critical Care Total Time: 40 minutes  Mitzie DELENA Freund MD Trauma & General Surgery Please use [email or web address] to contact on call provider  04/20/2024  *Care during the described time interval was provided by me. I have reviewed this patient's available data, including medical history, events of note, physical examination and test results as part of my evaluation.    "

## 2024-04-20 NOTE — Progress Notes (Addendum)
.. ° ° °  PROCEDURAL EXPEDITER PROGRESS NOTE  Patient Name: Tom Norton  DOB:29-Jul-1929 Date of Admission: 04/17/2024  Date of Assessment:04/20/2024   -------------------------------------------------------------------------------------------------------------------   Brief clinical summary: Pt to OR tomorrow for Intertrochanteric fracture with intramedullary rod  Orders in place:  No   Communication with surgical team if no orders: IB Dr. Jerri  Labs, test, and orders reviewed: Y  Requires surgical clearance:  Yes  What type of clearance: Neuro and Trauma  Clearance received: Y  Barriers noted: N/A   -------------------------------------------------------------------------------------------------------------------  [facility], [place], [place] Please contact us  directly via secure chat (search for [facility]) or by calling us  at [phone] [facility]).

## 2024-04-20 NOTE — Progress Notes (Signed)
 SLP Cancellation Note  Patient Details Name: PELHAM HENNICK MRN: [id number] DOB: 1929-06-12   Cancelled treatment:       Reason Eval/Treat Not Completed: Medical issues which prohibited therapy. Per RN, patient's WOB has increased and his mental status has declined. SLP will follow for readiness.  [name] IVAR Blase, MA, CCC-SLP Speech Therapy  04/20/2024, 8:41 AM

## 2024-04-20 NOTE — Progress Notes (Signed)
 STROKE TEAM PROGRESS NOTE    INTERIM HISTORY/SUBJECTIVE Patient presented on 12/25 as a trauma he fell off his balcony.  Repeat CT head revealed a new acute infarct in the left occipital lobe. This morning patient's wife is at the bedside.  Patient is laying in bed on a nonrebreather mask.  His eyes are open but he is unresponsive to noxious stimuli and voice.  He does not answer orientation questions he does moan to noxious stimuli.  He does not follow commands, no blink to threat bilaterally, does have corneal reflex intact bilaterally.  He does move bilateral arms nicely, wiggles toes bilaterally and withdraws on the left leg right leg is in traction.  He was continuing to desat so they were in the process of putting him on BiPAP.  He does remain on 8 mg of Cardene  Recommend getting vessel imaging and MRI when patient is stable  CBC    Component Value Date/Time   WBC 8.4 04/20/2024 0551   RBC 2.23 (L) 04/20/2024 0551   HGB 7.0 (L) 04/20/2024 0904   HCT 21.3 (L) 04/20/2024 0904   PLT 96 (L) 04/20/2024 0551   MCV 95.1 04/20/2024 0551   MCH 32.3 04/20/2024 0551   MCHC 34.0 04/20/2024 0551   RDW 16.3 (H) 04/20/2024 0551   LYMPHSABS 0.4 (L) 05/04/2021 1354   MONOABS 0.4 05/04/2021 1354   EOSABS 0.7 (H) 05/04/2021 1354   BASOSABS 0.0 05/04/2021 1354    BMET    Component Value Date/Time   NA 145 04/20/2024 0814   K 3.9 04/20/2024 0814   CL 118 (H) 04/20/2024 0551   CO2 20 (L) 04/20/2024 0551   GLUCOSE 133 (H) 04/20/2024 0551   BUN 31 (H) 04/20/2024 0551   CREATININE 0.99 04/20/2024 0551   CALCIUM  8.2 (L) 04/20/2024 0551   GFRNONAA >60 04/20/2024 0551    IMAGING past 24 hours DG CHEST PORT 1 VIEW Result Date: 04/20/2024 CLINICAL DATA:  Hypoxia. EXAM: PORTABLE CHEST 1 VIEW COMPARISON:  04/18/2024 FINDINGS: Stable cardiomegaly. Feeding tube is seen entering the stomach although distal tip is not visualized on this exam. New right lower lobe opacity is seen with silhouetting of  the right hemidiaphragm which may be due to atelectasis or infiltrate. IMPRESSION: New right lower lobe atelectasis versus infiltrate. Stable cardiomegaly. Electronically Signed   By: [name] DELENA Kil M.D.   On: 04/20/2024 08:43   CT HEAD WO CONTRAST ( ) Result Date: 04/20/2024 EXAM: CT HEAD WITHOUT CONTRAST 04/20/2024 06:34:56 AM TECHNIQUE: CT of the head was performed without the administration of intravenous contrast. Automated exposure control, iterative reconstruction, and/or weight based adjustment of the mA/kV was utilized to reduce the radiation dose to as low as reasonably achievable. COMPARISON: Head CT yesterday and earlier. CLINICAL HISTORY: 89 year old male with multifocal posttraumatic intracranial hemorrhage after fall from roof. FINDINGS: BRAIN AND VENTRICLES: Stable 4 mm mostly low density right sided subdural hematoma. Stable 6 mm mixed density left sided subdural hematoma. Mild additional redistribution of parafalcine and tentorial hyperdense subdural hematoma blood, but not a significant change. Superimposed scattered bilateral subarachnoid hemorrhage and small volume posterior fossa subarachnoid hemorrhage not significantly changed. Difficult to exclude occasional small hemorrhagic contusions including in the left lateral temporal lobe on coronal image 40. Confluent roughly 4 cm area of gray and white matter edema at the inferior left occipital lobe is stable (sagittal image 51). This could be a small infarct or nonhemorrhagic contusion (series 2 image 15). Gray white differentiation elsewhere remains stable and within  normal limits. No intraventricular blood or ventriculomegaly. No midline shift. Normal basilar cistern patency. Calcified atherosclerosis at the skull base. No suspicious intracranial vascular hyperdensity. ORBITS: No acute abnormality. SINUSES: Paranasal sinuses, tympanic cavities and mastoids remain well aerated. SOFT TISSUES AND SKULL: Left nasoenteric tube redemonstrated.  Long segment nondisplaced skull fracture from the midline vertex tracking along the left lambdoid suture is stable. Scalp hematoma has regressed. IMPRESSION: 1. Stable since yesterday multifocal posttraumatic intracranial hemorrhage, including left (6 mm) > right (4 mm) mixed density SDH. 2. Unchanged left occipital lobe edema (4 cm) which might be infarct or a non-hemorrhagic contusion. 3. No increased or significant intracranial mass effect. Stable nondisplaced skull fracture. 4. No new intracranial abnormality. Electronically signed by: Helayne Hurst MD 04/20/2024 06:48 AM EST RP Workstation: [id number]    Vitals:   04/20/24 0800 04/20/24 0900 04/20/24 0954 04/20/24 1000  BP: 128/65 136/63 (!) 123/49 134/69  Pulse: 71 91  88  Resp: (!) 23 20  20   Temp: 98.7 F (37.1 C)     TempSrc: Axillary     SpO2: 95% 93%  94%  Weight:         PHYSICAL EXAM General: Critically ill Psych:  Mood and affect appropriate for situation CV: Irregular/A-fib on monitor Respiratory:  Regular, unlabored respirations on nonrebreather mask GI: Abdomen soft and nontender   NEURO:  Mental Status: Eyes are open, does not answer orientation questions, does moan to noxious stimuli, does not follow commands  Cranial Nerves:  II: PERRL. Visual fields no blink to threat bilaterally III, IV, VI: EOMI. Eyelids elevate symmetrically.  V: Sensation is intact to light touch and symmetrical to face.  VII: Face is symmetrical resting and smiling VIII: hearing intact to voice. IX, X: Unable to assess XI: Unable to assess XII: tongue is midline without fasciculations. Motor: Moves bilateral upper extremities spontaneously and has mitts on, left leg withdraws, right leg in traction, wiggles toes bilaterally Tone: is normal and bulk is normal Sensation- Intact to light touch bilaterally. Extinction absent to light touch to DSS.   Coordination: Unable to assess Gait- deferred   ASSESSMENT/PLAN  Tom Norton  is a 89 y.o. male with history of BPH, Cardiomegaly, Dilated aortic root, Dysrhythmia, GERD, hiatal hernia, Hyperlipidemia, Hypertension, Hypothyroidism, Insomnia, Junctional bradycardia, OA, Obesity, Paroxysmal A-fib (on Eliquis ), Peripheral neuropathy and Vitamin D  deficiency.who presented to the ED on Thursday evening after a 10 foot fall off of his roof, where he had been cleaning leaves. GCS was 10 on arrival.  Initial CT head revealed bilateral subdural hemorrhages, multifocal SAH, minimal rightward midline shift and a nondisplaced long segment left skull fracture with an overlying scalp hematoma.   Repeat CT head performed this morning revealed evidence of an acute infarct in the inferior left occipital lobe (4 cm, Left PCA), new relative to the prior scan from the day before. Neurology has been consulted for stroke evaluation.   Acute Ischemic Infarct:  left occipital  Etiology: Cardioembolic in the setting of A-fib with anticoagulation on hold since fall CT head  acute infarct in the inferior left occipital lobe (4 cm, Left PCA), CTA head & neck ordered, obtained when patient stable MRI ordered obtained when patient stable LDL 49 HgbA1c 5.4 VTE prophylaxis -SCDs Eliquis  prior to admission, now on No antithrombotic due to traumatic brain bleeds.  Will need to hold off on restarting Eliquis  for at least 4 weeks and consider getting a CT scan before restarting Eliquis  Therapy recommendations:  Pending Disposition:  Pending  Atrial fibrillation Home Meds: Eliquis  Continue telemetry monitoring Hold anticoagulation in the setting of recent head trauma.  Consider obtaining brain imaging before restarting Eliquis , hold at least 4 weeks  Hypertensive Home meds: None Stable Requiring Cardene  drip currently at 8 mg Blood Pressure Goal: SBP less than 160   Hyperlipidemia Home meds: Coenzyme Q 10, not resumed in hospital LDL 49, goal < 70 Has noted allergy to statins however LDL way below  goal Continue statin at discharge  Acute hypoxic respiratory insufficiency On nonrebreather, currently desatting Primary team ordered BiPAP Management per primary team  Dysphagia Patient has post-stroke dysphagia, SLP consulted    Diet   Diet NPO time specified   Advance diet as tolerated  Other Stroke Risk Factors Obesity, Body mass index is 31.8 kg/m., BMI >/= 30 associated with increased stroke risk, recommend weight loss, diet and exercise as appropriate    Other Active Problems GERD Vitamin D  deficiency BPH  Hospital day # 3   Karna Geralds DNP, ACNPC-AG  [facility]    To contact Stroke Continuity provider, please refer to [email or web address]. After hours, contact General Neurology

## 2024-04-21 ENCOUNTER — Inpatient Hospital Stay (HOSPITAL_COMMUNITY)

## 2024-04-21 ENCOUNTER — Inpatient Hospital Stay (HOSPITAL_COMMUNITY): Admitting: Anesthesiology

## 2024-04-21 ENCOUNTER — Encounter (HOSPITAL_COMMUNITY): Admission: EM | Disposition: E | Payer: Self-pay | Source: Home / Self Care

## 2024-04-21 DIAGNOSIS — I480 Paroxysmal atrial fibrillation: Secondary | ICD-10-CM | POA: Diagnosis not present

## 2024-04-21 DIAGNOSIS — E039 Hypothyroidism, unspecified: Secondary | ICD-10-CM | POA: Diagnosis not present

## 2024-04-21 DIAGNOSIS — S72001A Fracture of unspecified part of neck of right femur, initial encounter for closed fracture: Secondary | ICD-10-CM

## 2024-04-21 DIAGNOSIS — I10 Essential (primary) hypertension: Secondary | ICD-10-CM

## 2024-04-21 DIAGNOSIS — S7224XA Nondisplaced subtrochanteric fracture of right femur, initial encounter for closed fracture: Secondary | ICD-10-CM

## 2024-04-21 DIAGNOSIS — Z7189 Other specified counseling: Secondary | ICD-10-CM | POA: Diagnosis not present

## 2024-04-21 DIAGNOSIS — S7221XA Displaced subtrochanteric fracture of right femur, initial encounter for closed fracture: Secondary | ICD-10-CM

## 2024-04-21 DIAGNOSIS — T07XXXA Unspecified multiple injuries, initial encounter: Secondary | ICD-10-CM

## 2024-04-21 DIAGNOSIS — Z515 Encounter for palliative care: Secondary | ICD-10-CM | POA: Diagnosis not present

## 2024-04-21 HISTORY — PX: INTRAMEDULLARY (IM) NAIL INTERTROCHANTERIC: SHX5875

## 2024-04-21 LAB — BASIC METABOLIC PANEL WITH GFR
Anion gap: 8 (ref 5–15)
BUN: 28 mg/dL — ABNORMAL HIGH (ref 8–23)
CO2: 20 mmol/L — ABNORMAL LOW (ref 22–32)
Calcium: 8.1 mg/dL — ABNORMAL LOW (ref 8.9–10.3)
Chloride: 119 mmol/L — ABNORMAL HIGH (ref 98–111)
Creatinine, Ser: 0.98 mg/dL (ref 0.61–1.24)
GFR, Estimated: >60 mL/min
Glucose, Bld: 112 mg/dL — ABNORMAL HIGH (ref 70–99)
Potassium: 3.7 mmol/L (ref 3.5–5.1)
Sodium: 147 mmol/L — ABNORMAL HIGH (ref 135–145)

## 2024-04-21 LAB — BPAM RBC
Blood Product Expiration Date: 202512272359
Blood Product Expiration Date: 202601012359
Blood Product Expiration Date: 202601022359
Blood Product Expiration Date: 202601032359
Blood Product Expiration Date: 202601072359
Blood Product Expiration Date: 202601142359
ISSUE DATE / TIME: 202512252046
ISSUE DATE / TIME: 202512252046
ISSUE DATE / TIME: 202512260158
ISSUE DATE / TIME: 202512281514
ISSUE DATE / TIME: 202512281759
Unit Type and Rh: 5100
Unit Type and Rh: 5100
Unit Type and Rh: 9500
Unit Type and Rh: 9500
Unit Type and Rh: 9500
Unit Type and Rh: 9500

## 2024-04-21 LAB — POCT I-STAT 7, (LYTES, BLD GAS, ICA,H+H)
Acid-base deficit: 6 mmol/L — ABNORMAL HIGH (ref 0.0–2.0)
Bicarbonate: 18.3 mmol/L — ABNORMAL LOW (ref 20.0–28.0)
Calcium, Ion: 1.21 mmol/L (ref 1.15–1.40)
HCT: 17 % — ABNORMAL LOW (ref 39.0–52.0)
Hemoglobin: 5.8 g/dL — CL (ref 13.0–17.0)
O2 Saturation: 97 %
Patient temperature: 36.5
Potassium: 3.7 mmol/L (ref 3.5–5.1)
Sodium: 146 mmol/L — ABNORMAL HIGH (ref 135–145)
TCO2: 19 mmol/L — ABNORMAL LOW (ref 22–32)
pCO2 arterial: 28.8 mmHg — ABNORMAL LOW (ref 32–48)
pH, Arterial: 7.41 (ref 7.35–7.45)
pO2, Arterial: 85 mmHg (ref 83–108)

## 2024-04-21 LAB — TYPE AND SCREEN
ABO/RH(D): O NEG
Antibody Screen: NEGATIVE
Unit division: 0
Unit division: 0
Unit division: 0
Unit division: 0
Unit division: 0

## 2024-04-21 LAB — PHOSPHORUS: Phosphorus: 1.9 mg/dL — ABNORMAL LOW (ref 2.5–4.6)

## 2024-04-21 LAB — CBC
HCT: 24.7 % — ABNORMAL LOW (ref 39.0–52.0)
Hemoglobin: 8.3 g/dL — ABNORMAL LOW (ref 13.0–17.0)
MCH: 31.8 pg (ref 26.0–34.0)
MCHC: 33.6 g/dL (ref 30.0–36.0)
MCV: 94.6 fL (ref 80.0–100.0)
Platelets: 101 K/uL — ABNORMAL LOW (ref 150–400)
RBC: 2.61 MIL/uL — ABNORMAL LOW (ref 4.22–5.81)
RDW: 16.7 % — ABNORMAL HIGH (ref 11.5–15.5)
WBC: 6.7 K/uL (ref 4.0–10.5)
nRBC: 0.7 % — ABNORMAL HIGH (ref 0.0–0.2)

## 2024-04-21 LAB — GLUCOSE, CAPILLARY
Glucose-Capillary: 108 mg/dL — ABNORMAL HIGH (ref 70–99)
Glucose-Capillary: 108 mg/dL — ABNORMAL HIGH (ref 70–99)
Glucose-Capillary: 111 mg/dL — ABNORMAL HIGH (ref 70–99)
Glucose-Capillary: 173 mg/dL — ABNORMAL HIGH (ref 70–99)
Glucose-Capillary: 201 mg/dL — ABNORMAL HIGH (ref 70–99)
Glucose-Capillary: 218 mg/dL — ABNORMAL HIGH (ref 70–99)

## 2024-04-21 LAB — MAGNESIUM: Magnesium: 2.3 mg/dL (ref 1.7–2.4)

## 2024-04-21 LAB — HEMOGLOBIN AND HEMATOCRIT, BLOOD
HCT: 26.3 % — ABNORMAL LOW (ref 39.0–52.0)
Hemoglobin: 8.8 g/dL — ABNORMAL LOW (ref 13.0–17.0)

## 2024-04-21 LAB — PREPARE RBC (CROSSMATCH)

## 2024-04-21 MED ORDER — FENTANYL CITRATE (PF) 50 MCG/ML IJ SOSY
25.0000 ug | PREFILLED_SYRINGE | Freq: Once | INTRAMUSCULAR | Status: AC
  Administered 2024-04-21: 50 ug via INTRAVENOUS
  Filled 2024-04-21: qty 1

## 2024-04-21 MED ORDER — PROPOFOL 10 MG/ML IV BOLUS
INTRAVENOUS | Status: DC | PRN
  Administered 2024-04-21: 40 ug/kg/min via INTRAVENOUS
  Administered 2024-04-21: 50 mg via INTRAVENOUS

## 2024-04-21 MED ORDER — VASOPRESSIN 20 UNITS/100 ML INFUSION FOR SHOCK
INTRAVENOUS | Status: AC
  Filled 2024-04-21: qty 100

## 2024-04-21 MED ORDER — ALBUMIN HUMAN 5 % IV SOLN: INTRAVENOUS | Status: DC | PRN

## 2024-04-21 MED ORDER — SODIUM CHLORIDE 0.9% IV SOLUTION: Freq: Once | INTRAVENOUS | Status: AC

## 2024-04-21 MED ORDER — POLYETHYLENE GLYCOL 3350 17 G PO PACK
17.0000 g | PACK | Freq: Every day | ORAL | Status: DC
  Administered 2024-04-21 – 2024-04-29 (×6): 17 g
  Filled 2024-04-21 (×8): qty 1

## 2024-04-21 MED ORDER — FENTANYL 2500MCG IN NS 250ML (10MCG/ML) PREMIX INFUSION: 0.0000 ug/h | INTRAVENOUS | Status: DC

## 2024-04-21 MED ORDER — FUROSEMIDE 10 MG/ML IJ SOLN
40.0000 mg | Freq: Once | INTRAMUSCULAR | Status: AC
  Administered 2024-04-21: 40 mg via INTRAVENOUS
  Filled 2024-04-21: qty 4

## 2024-04-21 MED ORDER — ROCURONIUM BROMIDE 10 MG/ML (PF) SYRINGE
PREFILLED_SYRINGE | INTRAVENOUS | Status: AC
  Filled 2024-04-21: qty 10

## 2024-04-21 MED ORDER — EPHEDRINE SULFATE-NACL 50-0.9 MG/10ML-% IV SOSY
PREFILLED_SYRINGE | INTRAVENOUS | Status: DC | PRN
  Administered 2024-04-21: 5 mg via INTRAVENOUS

## 2024-04-21 MED ORDER — SODIUM CHLORIDE 0.9 % IV SOLN: INTRAVENOUS | Status: AC

## 2024-04-21 MED ORDER — PROPOFOL 10 MG/ML IV BOLUS
INTRAVENOUS | Status: AC
  Filled 2024-04-21: qty 20

## 2024-04-21 MED ORDER — FENTANYL CITRATE (PF) 50 MCG/ML IJ SOSY: 50.0000 ug | PREFILLED_SYRINGE | INTRAMUSCULAR | Status: DC | PRN

## 2024-04-21 MED ORDER — LIDOCAINE 2% (20 MG/ML) 5 ML SYRINGE
INTRAMUSCULAR | Status: DC | PRN
  Administered 2024-04-21: 100 mg via INTRAVENOUS

## 2024-04-21 MED ORDER — POTASSIUM PHOSPHATES 15 MMOLE/5ML IV SOLN
15.0000 mmol | Freq: Once | INTRAVENOUS | Status: DC
  Filled 2024-04-21: qty 5

## 2024-04-21 MED ORDER — DEXMEDETOMIDINE HCL IN NACL 400 MCG/100ML IV SOLN
0.0000 ug/kg/h | INTRAVENOUS | Status: DC
  Administered 2024-04-21: 0.5 ug/kg/h via INTRAVENOUS
  Administered 2024-04-21: 0.4 ug/kg/h via INTRAVENOUS
  Administered 2024-04-22 (×2): 0.3 ug/kg/h via INTRAVENOUS
  Administered 2024-04-23: 0.2 ug/kg/h via INTRAVENOUS
  Administered 2024-04-24: 0.4 ug/kg/h via INTRAVENOUS
  Administered 2024-04-24 – 2024-04-25 (×2): 0.6 ug/kg/h via INTRAVENOUS
  Administered 2024-04-25: 0.7 ug/kg/h via INTRAVENOUS
  Filled 2024-04-21 (×9): qty 100

## 2024-04-21 MED ORDER — FENTANYL CITRATE (PF) 250 MCG/5ML IJ SOLN
INTRAMUSCULAR | Status: DC | PRN
  Administered 2024-04-21: 100 ug via INTRAVENOUS

## 2024-04-21 MED ORDER — VASOPRESSIN 20 UNITS/100 ML INFUSION FOR SHOCK
0.0000 [IU]/min | INTRAVENOUS | Status: DC
  Administered 2024-04-21 – 2024-04-24 (×6): 0.03 [IU]/min via INTRAVENOUS
  Filled 2024-04-21 (×6): qty 100

## 2024-04-21 MED ORDER — SUGAMMADEX SODIUM 200 MG/2ML IV SOLN
INTRAVENOUS | Status: DC | PRN
  Administered 2024-04-21: 200 mg via INTRAVENOUS

## 2024-04-21 MED ORDER — PHENYLEPHRINE HCL-NACL 20-0.9 MG/250ML-% IV SOLN
INTRAVENOUS | Status: DC | PRN
  Administered 2024-04-21: 30 ug/min via INTRAVENOUS

## 2024-04-21 MED ORDER — ACETAMINOPHEN 160 MG/5ML PO SOLN
650.0000 mg | Freq: Four times a day (QID) | ORAL | Status: DC
  Administered 2024-04-21 – 2024-05-04 (×51): 650 mg
  Filled 2024-04-21 (×51): qty 20.3

## 2024-04-21 MED ORDER — DOCUSATE SODIUM 50 MG/5ML PO LIQD: 100.0000 mg | Freq: Two times a day (BID) | ORAL | Status: DC

## 2024-04-21 MED ORDER — LACTATED RINGERS IV SOLN: INTRAVENOUS | Status: DC | PRN

## 2024-04-21 MED ORDER — SODIUM CHLORIDE 0.9 % IV SOLN
15.0000 mmol | Freq: Once | INTRAVENOUS | Status: DC
  Filled 2024-04-21: qty 5

## 2024-04-21 MED ORDER — LIDOCAINE 2% (20 MG/ML) 5 ML SYRINGE
INTRAMUSCULAR | Status: AC
  Filled 2024-04-21: qty 5

## 2024-04-21 MED ORDER — ROCURONIUM BROMIDE 10 MG/ML (PF) SYRINGE
PREFILLED_SYRINGE | INTRAVENOUS | Status: DC | PRN
  Administered 2024-04-21 (×3): 10 mg via INTRAVENOUS
  Administered 2024-04-21: 70 mg via INTRAVENOUS

## 2024-04-21 MED ORDER — CEFAZOLIN SODIUM 1 G IJ SOLR
INTRAMUSCULAR | Status: AC
  Filled 2024-04-21: qty 20

## 2024-04-21 MED ORDER — K PHOS MONO-SOD PHOS DI & MONO 155-852-130 MG PO TABS
500.0000 mg | ORAL_TABLET | Freq: Once | ORAL | Status: AC
  Administered 2024-04-21: 500 mg via ORAL
  Filled 2024-04-21: qty 2

## 2024-04-21 MED ORDER — ONDANSETRON HCL 4 MG/2ML IJ SOLN
INTRAMUSCULAR | Status: DC | PRN
  Administered 2024-04-21: 4 mg via INTRAVENOUS

## 2024-04-21 MED ORDER — PHENYLEPHRINE 80 MCG/ML (10ML) SYRINGE FOR IV PUSH (FOR BLOOD PRESSURE SUPPORT)
PREFILLED_SYRINGE | INTRAVENOUS | Status: DC | PRN
  Administered 2024-04-21: 80 ug via INTRAVENOUS
  Administered 2024-04-21: 40 ug via INTRAVENOUS
  Administered 2024-04-21 (×2): 80 ug via INTRAVENOUS
  Administered 2024-04-21 (×2): 40 ug via INTRAVENOUS
  Administered 2024-04-21 (×3): 80 ug via INTRAVENOUS
  Administered 2024-04-21: 200 ug via INTRAVENOUS

## 2024-04-21 MED ORDER — FENTANYL CITRATE (PF) 250 MCG/5ML IJ SOLN
INTRAMUSCULAR | Status: AC
  Filled 2024-04-21: qty 5

## 2024-04-21 MED ORDER — PROPOFOL 1000 MG/100ML IV EMUL: 0.0000 ug/kg/min | INTRAVENOUS | Status: DC

## 2024-04-21 MED ORDER — ONDANSETRON HCL 4 MG/2ML IJ SOLN
INTRAMUSCULAR | Status: AC
  Filled 2024-04-21: qty 2

## 2024-04-21 MED ORDER — PIVOT 1.5 CAL PO LIQD
1000.0000 mL | ORAL | Status: DC
  Administered 2024-04-24 – 2024-04-28 (×7): 1000 mL
  Filled 2024-04-21: qty 1000

## 2024-04-21 MED ORDER — CEFAZOLIN SODIUM-DEXTROSE 2-3 GM-%(50ML) IV SOLR
INTRAVENOUS | Status: DC | PRN
  Administered 2024-04-21: 2 g via INTRAVENOUS

## 2024-04-21 MED ORDER — THIAMINE MONONITRATE 100 MG PO TABS
100.0000 mg | ORAL_TABLET | Freq: Every day | ORAL | Status: AC
  Administered 2024-04-21 – 2024-04-25 (×5): 100 mg
  Filled 2024-04-21 (×5): qty 1

## 2024-04-21 MED ORDER — FENTANYL BOLUS VIA INFUSION: 25.0000 ug | INTRAVENOUS | Status: DC | PRN

## 2024-04-21 MED ORDER — PHENYLEPHRINE HCL-NACL 20-0.9 MG/250ML-% IV SOLN
0.0000 ug/min | INTRAVENOUS | Status: DC
  Administered 2024-04-21: 50 ug/min via INTRAVENOUS
  Administered 2024-04-22 (×2): 80 ug/min via INTRAVENOUS
  Administered 2024-04-22: 170 ug/min via INTRAVENOUS
  Administered 2024-04-22: 190 ug/min via INTRAVENOUS
  Administered 2024-04-22: 140 ug/min via INTRAVENOUS
  Administered 2024-04-23: 50 ug/min via INTRAVENOUS
  Administered 2024-04-23: 90 ug/min via INTRAVENOUS
  Administered 2024-04-23: 190 ug/min via INTRAVENOUS
  Administered 2024-04-23: 90 ug/min via INTRAVENOUS
  Administered 2024-04-23: 350 ug/min via INTRAVENOUS
  Administered 2024-04-23: 200 ug/min via INTRAVENOUS
  Administered 2024-04-23: 340 ug/min via INTRAVENOUS
  Administered 2024-04-23: 140 ug/min via INTRAVENOUS
  Administered 2024-04-24 (×2): 50 ug/min via INTRAVENOUS
  Administered 2024-04-24: 30 ug/min via INTRAVENOUS
  Filled 2024-04-21 (×2): qty 250
  Filled 2024-04-21: qty 500
  Filled 2024-04-21: qty 250
  Filled 2024-04-21: qty 500
  Filled 2024-04-21 (×12): qty 250

## 2024-04-21 MED ORDER — DEXAMETHASONE SOD PHOSPHATE PF 10 MG/ML IJ SOLN
INTRAMUSCULAR | Status: DC | PRN
  Administered 2024-04-21: 4 mg via INTRAVENOUS

## 2024-04-21 MED ORDER — TRANEXAMIC ACID 1000 MG/10ML IV SOLN
2000.0000 mg | Freq: Once | INTRAVENOUS | Status: AC
  Administered 2024-04-21: 2000 mg via TOPICAL
  Filled 2024-04-21: qty 20

## 2024-04-21 MED ORDER — NOREPINEPHRINE 16 MG/250ML-% IV SOLN
0.0000 ug/min | INTRAVENOUS | Status: DC
  Administered 2024-04-21 – 2024-04-22 (×5): 40 ug/min via INTRAVENOUS
  Administered 2024-04-23 (×2): 10 ug/min via INTRAVENOUS
  Administered 2024-04-25: 14 ug/min via INTRAVENOUS
  Administered 2024-04-27: 2 ug/min via INTRAVENOUS
  Administered 2024-04-29: 15 ug/min via INTRAVENOUS
  Administered 2024-05-01: 7 ug/min via INTRAVENOUS
  Administered 2024-05-02: 10 ug/min via INTRAVENOUS
  Administered 2024-05-03: 5 ug/min via INTRAVENOUS
  Administered 2024-05-04: 36 ug/min via INTRAVENOUS
  Administered 2024-05-05: 18 ug/min via INTRAVENOUS
  Filled 2024-04-21 (×17): qty 250

## 2024-04-21 MED ORDER — PHENYLEPHRINE 80 MCG/ML (10ML) SYRINGE FOR IV PUSH (FOR BLOOD PRESSURE SUPPORT)
PREFILLED_SYRINGE | INTRAVENOUS | Status: AC
  Filled 2024-04-21: qty 20

## 2024-04-21 NOTE — Progress Notes (Signed)
 " Daily Progress Note   Date: 04/21/2024   Patient Name: Tom Norton  DOB: 03/21/1930  MRN: [id number]  Age / Sex: 89 y.o., male  Attending Physician: Rolla Seltzer, MD Primary Care Physician: Charlott Dorn LABOR, MD Admit Date: 04/17/2024 Length of Stay: 4 days  Reason for Follow-up: Establishing goals of care  Past Medical History:  Diagnosis Date   BPH (benign prostatic hyperplasia)    Cardiomegaly    Dilated aortic root    a. 09/2013: 4.2cm by echo.   Dysrhythmia    Enlarged prostate without lower urinary tract symptoms (luts)    GERD (gastroesophageal reflux disease)    rare   History of hiatal hernia    Hyperlipidemia    Hypertension    Hypothyroidism    Insomnia    Junctional bradycardia    OA (osteoarthritis)    Obesity    Paroxysmal A-fib (HCC)    a. 09/23/13 a-fib with RVR and hypotensive, requiring emergent cardioversion in ED   Peripheral neuropathy 07/17/2017   Vitamin D  deficiency     Assessment & Plan:   HPI/Patient Profile:   89 y.o. male  with past medical history of HLD, HTN, osteoarthritis, paroxysmal atrial fibrillation on eliquis  admitted on 04/17/2024  with SDH, SAH, and right femur fracture 2/2 fall from balcony. CT head 04/17/2024 w/o contrast demonstrated left posterior SDH up to 9 mm thickness, multifocal bilateral SAH, linear skull fracture of left parietooccipital bone, right posterior scalp hematoma. Repeat CT head on 04/18/2024 demonstrate increase in multifocal hemorrhage, now bilateral SDH, increased SAH volume, minimal midline shift has increased. Per neurosurgery note by Darnella MD on 04/17/2024 patient not a candidate for hematoma evacuation surgery if his hemorrhages were to expand.    Palliative medicine consulted for goals of care conversation.   SUMMARY OF RECOMMENDATIONS Full code, full scope PMT will continue to follow for outcomes and further GOC HCPOA in order Theresa [name], [name], Corean [name] Bores)  Symptom  Management:  Per primary team  Code Status: Full Code  Prognosis: Unable to determine  Discharge Planning: To Be Determined  Subjective:   Subjective: Chart Reviewed. Updates received. Patient Assessed. Created space and opportunity for patient  and family to explore thoughts and feelings regarding current medical situation.  Today's Discussion:  Met with the patient, wife, son, daughter, and son in law at the bedside. Family shared that the patient's mentation is much improved in the last 12-24 hours compared to the last 2-3 days. Family shares that they are hopeful that the patient will improve after surgery today for right femur ORIF.  Updated family medically that his scans showed stability in the bleed without progression.  Family shares that the patient required BiPAP yesterday due to desaturations while sleeping.  Family also shared that patient required some blood transfusion in order to help with his oxygen saturation.   Family remains hopeful that the patient will improvement up to the point where he can attend rehab with a goal of eventually returning home.  Family's overall outlook is much more hopeful now compared to the prior days as the patient's mentation is much improved.    The patient was very active even though he had severe scoliosis which worsened after his knee replacement.  Was still able to drive and perform activities of daily living with minimal help.  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  Family shares that  patient is a big [employer] as went to school there.  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  He wished that he had played golf for but which is unable to due to work.   Family inquired about possible placement when patient is able to participate in rehab and asked about [facility] up in [place]  versus inpatient rehab here at [facility].  Shared with them that rehab and [facility] is much more intense compared to rehab at a skilled nursing facility.  Family is hopeful that patient may qualify for inpatient rehab at [facility] since they are hopeful that he will participate in more intensive therapy so that he can return to his independent lifestyle.  Aware that we are not there yet and will have to await the outcome of the surgery and the patient's mentation in the next coming days as well as recommendations of the physical therapy team.  Son shares that patient after his knee replacement surgery had home health physical therapy and family partook in the bulk of the regimen, which is why they are hesitant to return to home health with physical therapy as they want him to have the most intense therapy that he can handle in order for the patient have the best outcome.  Review of Systems  Unable to perform ROS   Objective:   Primary Diagnoses: Present on Admission:  Critical polytrauma   Vital Signs:  BP 107/63   Pulse 75   Temp 99.3 F (37.4 C) (Axillary)   Resp (!) 25   Wt 89.4 kg   SpO2 92%   BMI 31.80 kg/m   Physical Exam Constitutional:      Appearance: He is ill-appearing.  HENT:     Head: Normocephalic.     Nose: Nose normal.     Mouth/Throat:     Mouth: Mucous membranes are dry.  Eyes:     Extraocular Movements: Extraocular movements intact.  Cardiovascular:     Rate and Rhythm: Tachycardia present.     Pulses: Normal pulses.  Pulmonary:     Comments: Tachypneic, on 10 L/min [place] and desaturation when sleeping Skin:    General: Skin is warm and dry.  Neurological:     Mental Status: He is alert. He is disoriented.  Psychiatric:        Mood and Affect: Mood normal.    Palliative Assessment/Data: 40%   Existing Vynca/ACP Documentation: Patient's Advance Directives states  If 2 physicians, one of which is a Dispensing Optician, determine that I  lack capacity to make or communicate health care decisions and: I have an incurable or irreversible condition that will result in my death within a relatively short period of time I become unconscious and my health care providers determine that, to a high degree of medical certainty, I will never regain my consciousness I suffer from advanced dementia or any other condition which results in the substantial loss of my cognitive ability and my healthcare providers determine that, to a high degree of medical certainty, this loss is not reversible In those situations I have stated in section 1, I direct that my health care providers: Shall withhold or withdraw life-prolonging measures Exceptions - Artificial Nutrition or Hydration No nutrition or hydration is desired if artificial life support is withheld or withdrawn I wish to be made as comfortable as possible I  direct that my health care providers take reasonable steps to keep me as clean, comfortable, and free of pain as possible so that my dignity is maintained, even though this care may hasten my death If I have appointed a health care agent by executing a health care power of attorney or similar instrument, and that health care agent is acting and available and gives instructions that differ from this Advance Directive, then I direct that: This Advance Directive will OVERRIDE instructions my health care agent gives about prolonging my life Documented Health Care Agents [facility] [place]  Thank you for allowing us  to participate in the care of Tom Norton PMT will continue to support holistically.  I personally spent a total of 50 minutes in the care of the patient today including preparing to see the patient, getting/reviewing separately obtained history, performing a medically appropriate exam/evaluation, counseling and educating, and documenting clinical information in the EHR.   [name] [name]  Palliative Medicine Team  Team Phone # [phone] (Nights/Weekends) 04/21/2024 11:09 AM  "

## 2024-04-21 NOTE — TOC Progression Note (Signed)
 Transition of Care [facility]) - Progression Note    Patient Details  Name: Tom Norton MRN: [id number] Date of Birth: [birth year]  [facility] CM/SW Contact  Kahlia Lagunes E Lolly Glaus, LCSW Phone Number: 04/21/2024, 11:45 AM  Clinical Narrative:    ICM continues to follow for needs, noted plan for OR today.      Barriers to Discharge: Continued Medical Work up               Expected Discharge Plan and Services       Living arrangements for the past 2 months: Single Family Home                                       Social Drivers of Health (SDOH) Interventions SDOH Screenings   Food Insecurity: Unknown (04/18/2024)  Housing: Unknown (04/18/2024)  Transportation Needs: Unknown (04/18/2024)  Utilities: Patient Unable To Answer (04/18/2024)  Social Connections: Unknown (04/18/2024)  Tobacco Use: Low Risk (04/17/2024)    Readmission Risk Interventions     No data to display

## 2024-04-21 NOTE — Transfer of Care (Signed)
 Immediate Anesthesia Transfer of Care Note  Patient: Tom Norton  Procedure(s) Performed: FIXATION, FRACTURE, INTERTROCHANTERIC, WITH INTRAMEDULLARY ROD (Right)  Patient Location: ICU  Anesthesia Type:General  Level of Consciousness: Patient remains intubated per anesthesia plan  Airway & Oxygen Therapy: Patient placed on Ventilator (see vital sign flow sheet for setting)  Post-op Assessment: Report given to RN and Post -op Vital signs reviewed and stable  Post vital signs: Reviewed and stable  Last Vitals:  Vitals Value Taken Time  BP 86/56 (aline 130/49) 04/21/24 14:25  Temp    Pulse 76 04/21/24 14:24  Resp 15 04/21/24 14:24  SpO2 97 % 04/21/24 14:24  Vitals shown include unfiled device data.  Last Pain:  Vitals:   04/21/24 0800  TempSrc: Axillary  PainSc:          Complications: No notable events documented.

## 2024-04-21 NOTE — Progress Notes (Signed)
 "  Trauma/Critical Care Follow Up Note  Subjective:    Overnight Issues: Saturations improved overnight.  Good response to diuresis  Objective:  Vital signs for last 24 hours: Temp:  [97.9 F (36.6 C)-99.3 F (37.4 C)] 99.3 F (37.4 C) (12/29 0800) Pulse Rate:  [62-95] 83 (12/29 0700) Resp:  [5-25] 18 (12/29 0700) BP: (91-134)/(46-69) 106/64 (12/29 0700) SpO2:  [94 %-99 %] 96 % (12/29 0700) Arterial Line BP: (98-149)/(39-73) 148/56 (12/29 0700) FiO2 (%):  [40 %-80 %] 40 % (12/28 1950)  Intake/Output from previous day: 12/28 0701 - 12/29 0700 In: 3347.2 [I.V.:2207.2; Blood:630; IV Piggyback:510] Out: 1950 [Urine:1950]  Intake/Output this shift: No intake/output data recorded.  Vent settings for last 24 hours: Vent Mode: PCV;BIPAP FiO2 (%):  [40 %-80 %] 40 % Set Rate:  [16 bmp] 16 bmp PEEP:  [5 cmH20] 5 cmH20 Pressure Support:  [5 cmH20-10 cmH20] 10 cmH20  Physical Exam:  Gen: Appears comfortable, minimally responsive, currently on nonrebreather satting 96 to 97% Neuro: Intermittently following commands, HEENT: PERRL Neck: supple CV: RRR Pulm: unlabored breathing on nonrebreather, currently satting mid 90s Abd: soft, NT  , no recent BM GU: urine clear and yellow, +spontaneous voids Extr: wwp, no edema  Results for orders placed or performed during the hospital encounter of 04/17/24 (from the past 24 hours)  Hemoglobin and hematocrit, blood     Status: Abnormal   Collection Time: 04/20/24  9:04 AM  Result Value Ref Range   Hemoglobin 7.0 (L) 13.0 - 17.0 g/dL   HCT 78.6 (L) 60.9 - 47.9 %  Glucose, capillary     Status: Abnormal   Collection Time: 04/20/24 12:08 PM  Result Value Ref Range   Glucose-Capillary 143 (H) 70 - 99 mg/dL  Prepare RBC (crossmatch)     Status: None   Collection Time: 04/20/24  2:38 PM  Result Value Ref Range   Order Confirmation      ORDER PROCESSED BY BLOOD BANK Performed at [facility], [address], [place], [place]  [address]   I-STAT 7, (LYTES, BLD GAS, ICA, H+H)     Status: Abnormal   Collection Time: 04/20/24  2:49 PM  Result Value Ref Range   pH, Arterial 7.416 7.35 - 7.45   pCO2 arterial 29.3 (L) 32 - 48 mmHg   pO2, Arterial 66 (L) 83 - 108 mmHg   Bicarbonate 18.9 (L) 20.0 - 28.0 mmol/L   TCO2 20 (L) 22 - 32 mmol/L   O2 Saturation 94 %   Acid-base deficit 5.0 (H) 0.0 - 2.0 mmol/L   Sodium 146 (H) 135 - 145 mmol/L   Potassium 3.6 3.5 - 5.1 mmol/L   Calcium , Ion 1.27 1.15 - 1.40 mmol/L   HCT 17.0 (L) 39.0 - 52.0 %   Hemoglobin 5.8 (LL) 13.0 - 17.0 g/dL   Patient temperature 01.9 F    Sample type ARTERIAL   Glucose, capillary     Status: Abnormal   Collection Time: 04/20/24  3:40 PM  Result Value Ref Range   Glucose-Capillary 122 (H) 70 - 99 mg/dL  Glucose, capillary     Status: Abnormal   Collection Time: 04/20/24  8:01 PM  Result Value Ref Range   Glucose-Capillary 124 (H) 70 - 99 mg/dL  Type and screen [facility]     Status: None   Collection Time: 04/20/24  9:58 PM  Result Value Ref Range   ABO/RH(D) O NEG    Antibody Screen NEG  Sample Expiration      04/23/2024,2359 Performed at [facility], [address] [place], [place] [address]   Hemoglobin and hematocrit, blood     Status: Abnormal   Collection Time: 04/20/24  9:59 PM  Result Value Ref Range   Hemoglobin 8.5 (L) 13.0 - 17.0 g/dL   HCT 74.7 (L) 60.9 - 47.9 %  I-STAT 7, (LYTES, BLD GAS, ICA, H+H)     Status: Abnormal   Collection Time: 04/20/24 10:44 PM  Result Value Ref Range   pH, Arterial 7.454 (H) 7.35 - 7.45   pCO2 arterial 26.9 (L) 32 - 48 mmHg   pO2, Arterial 69 (L) 83 - 108 mmHg   Bicarbonate 18.8 (L) 20.0 - 28.0 mmol/L   TCO2 20 (L) 22 - 32 mmol/L   O2 Saturation 95 %   Acid-base deficit 4.0 (H) 0.0 - 2.0 mmol/L   Sodium 148 (H) 135 - 145 mmol/L   Potassium 3.2 (L) 3.5 - 5.1 mmol/L   Calcium , Ion 1.27 1.15 - 1.40 mmol/L   HCT 24.0 (L) 39.0 - 52.0 %   Hemoglobin 8.2 (L) 13.0 -  17.0 g/dL   Patient temperature 62.8 C    Sample type ARTERIAL   Glucose, capillary     Status: Abnormal   Collection Time: 04/21/24 12:19 AM  Result Value Ref Range   Glucose-Capillary 111 (H) 70 - 99 mg/dL   Comment 1 Notify RN   Glucose, capillary     Status: Abnormal   Collection Time: 04/21/24  4:02 AM  Result Value Ref Range   Glucose-Capillary 108 (H) 70 - 99 mg/dL  CBC     Status: Abnormal   Collection Time: 04/21/24  5:21 AM  Result Value Ref Range   WBC 6.7 4.0 - 10.5 K/uL   RBC 2.61 (L) 4.22 - 5.81 MIL/uL   Hemoglobin 8.3 (L) 13.0 - 17.0 g/dL   HCT 75.2 (L) 60.9 - 47.9 %   MCV 94.6 80.0 - 100.0 fL   MCH 31.8 26.0 - 34.0 pg   MCHC 33.6 30.0 - 36.0 g/dL   RDW 83.2 (H) 88.4 - 84.4 %   Platelets 101 (L) 150 - 400 K/uL   nRBC 0.7 (H) 0.0 - 0.2 %  Basic metabolic panel     Status: Abnormal   Collection Time: 04/21/24  5:21 AM  Result Value Ref Range   Sodium 147 (H) 135 - 145 mmol/L   Potassium 3.7 3.5 - 5.1 mmol/L   Chloride 119 (H) 98 - 111 mmol/L   CO2 20 (L) 22 - 32 mmol/L   Glucose, Bld 112 (H) 70 - 99 mg/dL   BUN 28 (H) 8 - 23 mg/dL   Creatinine, Ser 9.01 0.61 - 1.24 mg/dL   Calcium  8.1 (L) 8.9 - 10.3 mg/dL   GFR, Estimated >39 >39 mL/min   Anion gap 8 5 - 15  Magnesium      Status: None   Collection Time: 04/21/24  5:21 AM  Result Value Ref Range   Magnesium  2.3 1.7 - 2.4 mg/dL  Phosphorus     Status: Abnormal   Collection Time: 04/21/24  5:21 AM  Result Value Ref Range   Phosphorus 1.9 (L) 2.5 - 4.6 mg/dL  Glucose, capillary     Status: Abnormal   Collection Time: 04/21/24  7:39 AM  Result Value Ref Range   Glucose-Capillary 108 (H) 70 - 99 mg/dL    Assessment & Plan:  LOS: 4 days   Additional  comments:I reviewed the patient's new clinical lab test results.   and I reviewed the patients new imaging test results.    89 y.o. male s/p fall   Known Injuries: - Right femur fracture - SDH/SAH - Nondepressed parietoocipital bone fracture - Trace  left basilar pneumothorax - Bilateral rib fractures - Thoracic TP fractures - Possible T9-10 discoligamentous injury - Left psoas hemorrhage without extravasation   Plan  - Admit to trauma service - Appreciate neurosurgery recommendations, Dr. Garst              - MRI thoracic spine when able              - Repeat CTH at 0600 with stable ICH, but new occipital stroke, Dr. Merrianne consulted.  - q1h neuro check - CTA Neck ordered to check for embolic source - Appreciate orthopaedics recommendations, Dr. Jerri, Dr. Kendal              - Traction              - Discussed risks of surgery with wife at bedside.  I feel surgery today is appropriate despite risks due to pain and immobility related to femur fracture.  Respiratory status is improving and appears to be related to atelectasis and fluid overload.  One additional dose of lasix  this morning to follow good response yesterday.  Aggressive electrolyte replacement.  Discussed with wife, patient may need to stay intubated, and may require ongoing intubation postoperatively, depending on anesthesia assessment and response to surgery.   We discussed the risks of surgery including worsening neuro status, worsening respiratory status, and possible death.  After a full discussion the patient's wife voiced understanding and agreement with proceeding for femur fixation today with orthopedic surgery.  - FEN - strict NPO, place cortrak, SLP eval - DVT - SCDs, hold chemical ppx due to bleeding concerns - Dispo - ICU, clinical update provided to wife at bedside this AM. Family requested transfer to [facility] yesterday, which Dr. Paola initiated, and he was initially accepted and then rescinded. Continue current care.   Critical Care Total Time: 38 minutes  Deward JINNY Foy, MD Trauma & General Surgery Please use [email or web address] to contact on call provider  04/21/2024  *Care during the described time interval was provided by me. I have reviewed this patient's  available data, including medical history, events of note, physical examination and test results as part of my evaluation.    "

## 2024-04-21 NOTE — H&P (Signed)

## 2024-04-21 NOTE — Anesthesia Preprocedure Evaluation (Addendum)
"                                    Anesthesia Evaluation  Patient identified by MRN, date of birth, ID band Patient awake    Reviewed: Allergy & Precautions, Patient's Chart, lab work & pertinent test results, Unable to perform ROS - Chart review only  History of Anesthesia Complications Negative for: history of anesthetic complications  Airway Mallampati: II  TM Distance: >3 FB Neck ROM: Full    Dental no notable dental hx.    Pulmonary neg COPD pneumothorax  HFNC  breath sounds clear to auscultation       Cardiovascular hypertension, Pt. on medications (-) angina (-) Past MI Normal cardiovascular exam+ dysrhythmias Atrial Fibrillation  Rhythm:Regular Rate:Normal     Neuro/Psych neg Seizures SDH  negative psych ROS   GI/Hepatic Neg liver ROS, hiatal hernia,GERD  ,,  Endo/Other  Hypothyroidism    Renal/GU      Musculoskeletal  (+) Arthritis ,  Intertroch fx right side   Abdominal   Peds  Hematology negative hematology ROS (+) Plt 101 Hg 8.3   Anesthesia Other Findings Admitted to trauma team following fall with the following injuries: - Right femur fracture - SDH/SAH - Nondepressed parietoocipital bone fracture - Trace left basilar pneumothorax - Bilateral rib fractures - Thoracic TP fractures - Possible T9-10 discoligamentous injury - Left psoas hemorrhage without extravasation   Reproductive/Obstetrics                              Anesthesia Physical Anesthesia Plan  ASA: 3  Anesthesia Plan: General   Post-op Pain Management: Tylenol  PO (pre-op)*   Induction: Intravenous  PONV Risk Score and Plan: 2 and Ondansetron , Dexamethasone  and Treatment may vary due to age or medical condition  Airway Management Planned: Oral ETT  Additional Equipment: Arterial line  Intra-op Plan:   Post-operative Plan: Possible Post-op intubation/ventilation  Informed Consent: I have reviewed the patients History and  Physical, chart, labs and discussed the procedure including the risks, benefits and alternatives for the proposed anesthesia with the patient or authorized representative who has indicated his/her understanding and acceptance.     Dental advisory given and Consent reviewed with POA  Plan Discussed with: CRNA  Anesthesia Plan Comments:          Anesthesia Quick Evaluation  "

## 2024-04-21 NOTE — Progress Notes (Signed)
 OT Cancellation Note  Patient Details Name: KAZUTO SEVEY MRN: [id number] DOB: [birth year]   Cancelled Treatment:    Reason Eval/Treat Not Completed: Patient at procedure or test/ unavailable (surgery R femur)  Lucie BIRCH Causey 04/21/2024, 12:00 PM

## 2024-04-21 NOTE — Progress Notes (Signed)
 This chaplain responded to PMT PA-Joseph consult for prayer with the Pt. and family. The Pt. wife-Tom Norton, son, daughter, and son in law are at the bedside during the visit. The Pt. is awake and requests his hearing aid to hear the chaplain's prayer.   The family is very hopeful today's surgery will be a success. The chaplain listened reflectively as Tom Norton shared the Pt. hope of many more years with his spouse. The Pt. and family are supported by their personal faith and prayers from clergy and community. The chaplain accepted the family's request for intercessory prayer and F/U spiritual care.  Chaplain Tom Norton [phone]

## 2024-04-21 NOTE — Progress Notes (Signed)
 Nutrition Follow-up  DOCUMENTATION CODES:  Not applicable  INTERVENTION:  Once back from the OR, restart tube feeding via cortrak tube: Pivot 1.5 at 55 ml/h (1320 ml per day) Start at 25 and advance by 10mL every 8 hours to reach goal Provides 1980 kcal, 123 gm protein, 1003 ml free water  daily Pt with altered electrolytes  (being replaced. Add thiamine  100mg  x 5 days while enteral nutrition is initiated  NUTRITION DIAGNOSIS:  Increased nutrient needs related to  (trauma) as evidenced by estimated needs. - remains applicable  GOAL:  Patient will meet greater than or equal to 90% of their needs - progressing  MONITOR:  I & O's  REASON FOR ASSESSMENT:   (cortrak placement)    ASSESSMENT:  Pt with PMH of Afib on Eliquis , cardiomegaly, GERD, hiatal hernia, HLD, HTN, OA, and vitamin D  deficiency who was admitted after falling off the roof with R femur fx, SDH/SAH, nondepressed parietoccipital bone fxs, bilateral rib fxs, thoracic TP fxs, possible T9-10 injury, and L psoas hemorrhage.  12/26 - s/p cortrak placement, gastric 12/28 - respiratory and neurologic decline after being laid flat for CT 12/29 - OR, repair of right femur fractures  Pt out of room in OR at this time. Over the weekend, pt with ongoing poor mentation and unable to work with SLP. Does not appear that enteral nutrition was ordered. Noted that electrolytes altered and being replaced by [facility].   Discussed with RN and MD, once back from OR will initiate TF. Will also start thiamine  in the event that pt is refeeding. Will follow-up in-person for nutrition exam and hx.   Admit / Current weight: 89.4 kg    Intake/Output Summary (Last 24 hours) at 04/21/2024 1330 Last data filed at 04/21/2024 1325 Gross per 24 hour  Intake 3369.79 ml  Output 3050 ml  Net 319.79 ml  Net IO Since Admission: 8,295.52 mL [04/21/24 1330]  Drains/Lines: PICC Triple Lumen Art Line left radial Cortrak, gastric UOP out x 24  hours  Nutritionally Relevant Medications: Scheduled Meds:  [MAR Hold] docusate  100 mg Per Tube BID   [MAR Hold] doxazosin   2 mg Per Tube Daily   [MAR Hold] insulin  aspart  0-9 Units Subcutaneous Q4H   [MAR Hold] levETIRAcetam   500 mg Intravenous Q12H   [MAR Hold] levothyroxine   50 mcg Per Tube QAC breakfast   Continuous Infusions:  sodium chloride  Stopped (04/21/24 1153)   norepinephrine  (LEVOPHED ) Adult infusion Stopped (04/18/24 1025)   PRN Meds ondansetron , polyethylene glycol,  Labs Reviewed: Sodium 147, chloride 119 BUN 28 Phosphorus 1.9 CBG ranges from 108-143 mg/dL over the last 24 hours HgbA1c 5.4%  NUTRITION - FOCUSED PHYSICAL EXAM: Defer to in-person assessment  Diet Order:   Diet Order             Diet NPO time specified  Diet effective midnight                   EDUCATION NEEDS:  Not appropriate for education at this time  Skin:  Skin Assessment: Reviewed RN Assessment  Last BM:  PTA  Height:  Ht Readings from Last 1 Encounters:  10/17/23 5' 6 (1.676 m)    Weight:  Wt Readings from Last 1 Encounters:  04/17/24 89.4 kg    Ideal Body Weight:  64.5 kg  BMI:  Body mass index is 31.8 kg/m.  Estimated Nutritional Needs:  Kcal:  1800-2000 Protein:  115-130 grams Fluid:  > 1.8 L/day  Vernell Lukes, RD, LDN, CNSC Registered Dietitian II Please reach out via secure chat

## 2024-04-21 NOTE — Anesthesia Procedure Notes (Addendum)
 Procedure Name: Intubation Date/Time: 04/21/2024 12:11 PM  Performed by: Evetta Delon BROCKS, CRNAPre-anesthesia Checklist: Patient identified, Emergency Drugs available, Suction available and Patient being monitored Patient Re-evaluated:Patient Re-evaluated prior to induction Oxygen Delivery Method: Circle system utilized Preoxygenation: Pre-oxygenation with 100% oxygen Induction Type: IV induction Ventilation: Mask ventilation without difficulty and Oral airway inserted - appropriate to patient size Laryngoscope Size: Glidescope and 3 Grade View: Grade I Tube type: Oral Tube size: 7.5 mm Number of attempts: 1 Airway Equipment and Method: Stylet and Oral airway Placement Confirmation: ETT inserted through vocal cords under direct vision, positive ETCO2 and breath sounds checked- equal and bilateral Secured at: 23 cm Tube secured with: Tape Dental Injury: Teeth and Oropharynx as per pre-operative assessment  Comments: Elective glidescope, recent trauma

## 2024-04-21 NOTE — Anesthesia Postprocedure Evaluation (Signed)
"   Anesthesia Post Note  Patient: Tom Norton  Procedure(s) Performed: FIXATION, FRACTURE, INTERTROCHANTERIC, WITH INTRAMEDULLARY ROD (Right)     Patient location during evaluation: SICU Anesthesia Type: General Level of consciousness: sedated Pain management: pain level controlled Vital Signs Assessment: post-procedure vital signs reviewed and stable Respiratory status: patient remains intubated per anesthesia plan Cardiovascular status: stable Postop Assessment: no apparent nausea or vomiting Anesthetic complications: no   No notable events documented.  Last Vitals:  Vitals:   04/21/24 1525 04/21/24 1530  BP:    Pulse: 96 94  Resp: 13 (!) 23  Temp:    SpO2: 100% 100%    Last Pain:  Vitals:   04/21/24 1445  TempSrc: Oral  PainSc:                  Thom JONELLE Peoples      "

## 2024-04-21 NOTE — Op Note (Signed)
 "  Date of Surgery: 04/21/2024  INDICATIONS: Tom Norton is a 89 y.o.-year-old male who sustained a right basicervical and subtrochanteric femur fracture. The risks and benefits of the procedure discussed with the patient's wife prior to the procedure and all questions were answered; consent was obtained.  PREOPERATIVE DIAGNOSIS: right basicervical and subtrochanteric femur fracture   POSTOPERATIVE DIAGNOSIS: Same   PROCEDURE: Open treatment of basicervical intertrochanteric and subtrochanteric fracture with intramedullary implant. CPT [phone]   SURGEON: Tom Norton, M.D.   ASSIST: Tom Norton, [place]  ANESTHESIA: general   IV FLUIDS AND URINE: See anesthesia record   ESTIMATED BLOOD LOSS: 300 cc  IMPLANTS: [employer] InterTAN 10 x 440, 100/95 lag screws, 2 distal interlocking screws  DRAINS: None.   COMPLICATIONS: see description of procedure.   DESCRIPTION OF PROCEDURE: The patient was brought to the operating room and placed supine on the operating table. The patient's leg had been signed prior to the procedure. The patient had the anesthesia placed by the anesthesiologist. The prep verification and incision time-outs were performed to confirm that this was the correct patient, site, side and location. The patient had an SCD on the opposite lower extremity. The patient did receive antibiotics prior to the incision and was re-dosed during the procedure as needed at indicated intervals. The patient was positioned on the HANA table with the leg in traction and internal rotation to reduce the intertrochanteric and subtrochanteric fracture. The well leg was placed in a scissor position and all bony prominences were well-padded. The patient had the lower extremity prepped and draped in the standard surgical fashion. The incision was made 4 finger breadths superior to the greater trochanter. A guide pin was inserted into the tip of the greater trochanter under fluoroscopic  guidance. An opening reamer was used to gain access to the femoral canal.  A reducer was inserted across the subtrochanteric fracture.  A guidewire was then passed through the reducer to the distal femoral physeal scar.  The nail length was measured to 44 cm.  The nail was inserted down the femoral canal to its proper depth.  Both fractures remained reduced.  The appropriate version of insertion for the lag screw was found under fluoroscopy. A pin was inserted up the femoral neck through the jig.  The length of the lag screw was then measured.  Sequential reamers were used.  Antirotation bar was inserted.  The lag screw was inserted as near to center-center in the head as possible. The antirotation bar was then taken out and an interdigitating compression screw was placed in its place. The leg was taken out of traction, then the interdigitating compression screw was used to compress across the intertrochanteric fracture. Compression was visualized on serial xrays.  The set screw was tightened.  The posterior subtrochanteric butterfly fragment could not be independently reduced but overall the femur was out to length and alignment.  Two distal interlocking screws were placed through the static slots using perfect circle technique.  The wounds were copiously irrigated with saline and the subcutaneous layer closed with 2.0 vicryl and the skin was reapproximated with staples. The wounds were cleaned and dried a final time and a sterile dressing was placed. The hip was taken through a range of motion at the end of the case under fluoroscopic imaging to visualize the approach-withdraw phenomenon and confirm implant length in the head. The patient was then awakened from anesthesia and taken to the recovery room in stable condition. All counts  were correct at the end of the case.   Tom Norton was necessary for opening, closing, retracting, limb positioning and overall facilitation and completion of the  surgery.  POSTOPERATIVE PLAN: The patient will be non weight bearing and will return in 2 weeks for staple removal and the patient will receive DVT prophylaxis based on other medications, activity level, and risk ratio of bleeding to thrombosis.   Tom Ozell Cummins, MD [facility] 1:29 PM   "

## 2024-04-21 NOTE — Progress Notes (Addendum)
 I saw the patient this morning and met with his wife at bedside. The patient has been stable overnight and respiratory status has improved. He does have delirium. He is as optimized as he can be in this immediate period. Plans are to move forward with surgical repair of right femur fracture later today. Remain NPO. Detailed surgical discussion was had with the wife, including risks of infection, fat embolism, DVT, and other risks. Patients wife voiced complete understanding, and all questions were answered to her satisfaction.

## 2024-04-21 NOTE — Progress Notes (Signed)
 Subjective: Patient is currently undergoing orthopedic surgery Objective: Vital signs in last 24 hours: Temp:  [97.9 F (36.6 C)-99.3 F (37.4 C)] 99.3 F (37.4 C) (12/29 0800) Pulse Rate:  [47-95] 47 (12/29 1100) Resp:  [5-25] 16 (12/29 1100) BP: (91-111)/(46-66) 94/60 (12/29 1100) SpO2:  [90 %-99 %] 90 % (12/29 1100) Arterial Line BP: (98-149)/(39-79) 134/48 (12/29 1100) FiO2 (%):  [40 %] 40 % (12/28 1950)  Intake/Output from previous day: 12/28 0701 - 12/29 0700 In: 3347.2 [I.V.:2207.2; Blood:630; IV Piggyback:510] Out: 1950 [Urine:1950] Intake/Output this shift: Total I/O In: 486.3 [I.V.:366.3; NG/GT:120] Out: 1875 [Urine:1675; Blood:200]  Exam deferred as patient in surgery  Lab Results: Recent Labs    04/20/24 0551 04/20/24 0814 04/20/24 2244 04/21/24 0521  WBC 8.4  --   --  6.7  HGB 7.2*   < > 8.2* 8.3*  HCT 21.2*   < > 24.0* 24.7*  PLT 96*  --   --  101*   < > = values in this interval not displayed.   BMET Recent Labs    04/20/24 0551 04/20/24 0814 04/20/24 2244 04/21/24 0521  NA 145   < > 148* 147*  K 3.9   < > 3.2* 3.7  CL 118*  --   --  119*  CO2 20*  --   --  20*  GLUCOSE 133*  --   --  112*  BUN 31*  --   --  28*  CREATININE 0.99  --   --  0.98  CALCIUM  8.2*  --   --  8.1*   < > = values in this interval not displayed.    Studies/Results: DG C-Arm 1-60 Min-No Report Result Date: 04/21/2024 Fluoroscopy was utilized by the requesting physician.  No radiographic interpretation.   DG C-Arm 1-60 Min-No Report Result Date: 04/21/2024 Fluoroscopy was utilized by the requesting physician.  No radiographic interpretation.   DG CHEST PORT 1 VIEW Result Date: 04/20/2024 CLINICAL DATA:  Hypoxia. EXAM: PORTABLE CHEST 1 VIEW COMPARISON:  04/18/2024 FINDINGS: Stable cardiomegaly. Feeding tube is seen entering the stomach although distal tip is not visualized on this exam. New right lower lobe opacity is seen with silhouetting of the right  hemidiaphragm which may be due to atelectasis or infiltrate. IMPRESSION: New right lower lobe atelectasis versus infiltrate. Stable cardiomegaly. Electronically Signed   By: [name] DELENA Kil M.D.   On: 04/20/2024 08:43   CT HEAD WO CONTRAST ( ) Result Date: 04/20/2024 EXAM: CT HEAD WITHOUT CONTRAST 04/20/2024 06:34:56 AM TECHNIQUE: CT of the head was performed without the administration of intravenous contrast. Automated exposure control, iterative reconstruction, and/or weight based adjustment of the mA/kV was utilized to reduce the radiation dose to as low as reasonably achievable. COMPARISON: Head CT yesterday and earlier. CLINICAL HISTORY: 89 year old male with multifocal posttraumatic intracranial hemorrhage after fall from roof. FINDINGS: BRAIN AND VENTRICLES: Stable 4 mm mostly low density right sided subdural hematoma. Stable 6 mm mixed density left sided subdural hematoma. Mild additional redistribution of parafalcine and tentorial hyperdense subdural hematoma blood, but not a significant change. Superimposed scattered bilateral subarachnoid hemorrhage and small volume posterior fossa subarachnoid hemorrhage not significantly changed. Difficult to exclude occasional small hemorrhagic contusions including in the left lateral temporal lobe on coronal image 40. Confluent roughly 4 cm area of gray and white matter edema at the inferior left occipital lobe is stable (sagittal image 51). This could be a small infarct or nonhemorrhagic contusion (series 2 image 15). Gray white differentiation elsewhere  remains stable and within normal limits. No intraventricular blood or ventriculomegaly. No midline shift. Normal basilar cistern patency. Calcified atherosclerosis at the skull base. No suspicious intracranial vascular hyperdensity. ORBITS: No acute abnormality. SINUSES: Paranasal sinuses, tympanic cavities and mastoids remain well aerated. SOFT TISSUES AND SKULL: Left nasoenteric tube redemonstrated. Long segment  nondisplaced skull fracture from the midline vertex tracking along the left lambdoid suture is stable. Scalp hematoma has regressed. IMPRESSION: 1. Stable since yesterday multifocal posttraumatic intracranial hemorrhage, including left (6 mm) > right (4 mm) mixed density SDH. 2. Unchanged left occipital lobe edema (4 cm) which might be infarct or a non-hemorrhagic contusion. 3. No increased or significant intracranial mass effect. Stable nondisplaced skull fracture. 4. No new intracranial abnormality. Electronically signed by: Helayne Hurst MD 04/20/2024 06:48 AM EST RP Workstation: [id number]    Assessment/Plan: 89 year old man critically ill with with polytrauma, numerous fractures including T9-10 disc space anterior osteophyte fracture.  Also suffered traumatic brain injury with scattered small traumatic subarachnoid hemorrhage and contusions, stable on imaging - When patient more stable, recommend MRI T-spine without contrast to rule out discal ligamentous injury and posterior ligamentous complex injury.    [name] 04/21/2024, 2:08 PM

## 2024-04-22 ENCOUNTER — Inpatient Hospital Stay (HOSPITAL_COMMUNITY)

## 2024-04-22 DIAGNOSIS — N189 Chronic kidney disease, unspecified: Secondary | ICD-10-CM | POA: Diagnosis not present

## 2024-04-22 DIAGNOSIS — I350 Nonrheumatic aortic (valve) stenosis: Secondary | ICD-10-CM | POA: Diagnosis not present

## 2024-04-22 DIAGNOSIS — D649 Anemia, unspecified: Secondary | ICD-10-CM

## 2024-04-22 DIAGNOSIS — J9601 Acute respiratory failure with hypoxia: Secondary | ICD-10-CM

## 2024-04-22 DIAGNOSIS — Z515 Encounter for palliative care: Secondary | ICD-10-CM | POA: Diagnosis not present

## 2024-04-22 DIAGNOSIS — M7989 Other specified soft tissue disorders: Secondary | ICD-10-CM | POA: Diagnosis not present

## 2024-04-22 DIAGNOSIS — R579 Shock, unspecified: Secondary | ICD-10-CM | POA: Diagnosis not present

## 2024-04-22 DIAGNOSIS — T07XXXA Unspecified multiple injuries, initial encounter: Secondary | ICD-10-CM | POA: Diagnosis not present

## 2024-04-22 DIAGNOSIS — Z7189 Other specified counseling: Secondary | ICD-10-CM | POA: Diagnosis not present

## 2024-04-22 LAB — GLUCOSE, CAPILLARY
Glucose-Capillary: 163 mg/dL — ABNORMAL HIGH (ref 70–99)
Glucose-Capillary: 179 mg/dL — ABNORMAL HIGH (ref 70–99)
Glucose-Capillary: 182 mg/dL — ABNORMAL HIGH (ref 70–99)
Glucose-Capillary: 191 mg/dL — ABNORMAL HIGH (ref 70–99)
Glucose-Capillary: 196 mg/dL — ABNORMAL HIGH (ref 70–99)
Glucose-Capillary: 213 mg/dL — ABNORMAL HIGH (ref 70–99)

## 2024-04-22 LAB — CBC
HCT: 21.4 % — ABNORMAL LOW (ref 39.0–52.0)
Hemoglobin: 7.3 g/dL — ABNORMAL LOW (ref 13.0–17.0)
MCH: 30.4 pg (ref 26.0–34.0)
MCHC: 34.1 g/dL (ref 30.0–36.0)
MCV: 89.2 fL (ref 80.0–100.0)
Platelets: 84 K/uL — ABNORMAL LOW (ref 150–400)
RBC: 2.4 MIL/uL — ABNORMAL LOW (ref 4.22–5.81)
RDW: 17.6 % — ABNORMAL HIGH (ref 11.5–15.5)
WBC: 10.5 K/uL (ref 4.0–10.5)
nRBC: 1.4 % — ABNORMAL HIGH (ref 0.0–0.2)

## 2024-04-22 LAB — PHOSPHORUS: Phosphorus: 3.6 mg/dL (ref 2.5–4.6)

## 2024-04-22 LAB — POCT I-STAT 7, (LYTES, BLD GAS, ICA,H+H)
Acid-base deficit: 5 mmol/L — ABNORMAL HIGH (ref 0.0–2.0)
Bicarbonate: 20.2 mmol/L (ref 20.0–28.0)
Calcium, Ion: 1.25 mmol/L (ref 1.15–1.40)
HCT: 20 % — ABNORMAL LOW (ref 39.0–52.0)
Hemoglobin: 6.8 g/dL — CL (ref 13.0–17.0)
O2 Saturation: 100 %
Patient temperature: 36.6
Potassium: 3.5 mmol/L (ref 3.5–5.1)
Sodium: 147 mmol/L — ABNORMAL HIGH (ref 135–145)
TCO2: 21 mmol/L — ABNORMAL LOW (ref 22–32)
pCO2 arterial: 38.7 mmHg (ref 32–48)
pH, Arterial: 7.324 — ABNORMAL LOW (ref 7.35–7.45)
pO2, Arterial: 192 mmHg — ABNORMAL HIGH (ref 83–108)

## 2024-04-22 LAB — BASIC METABOLIC PANEL WITH GFR
Anion gap: 9 (ref 5–15)
BUN: 34 mg/dL — ABNORMAL HIGH (ref 8–23)
CO2: 19 mmol/L — ABNORMAL LOW (ref 22–32)
Calcium: 7.3 mg/dL — ABNORMAL LOW (ref 8.9–10.3)
Chloride: 115 mmol/L — ABNORMAL HIGH (ref 98–111)
Creatinine, Ser: 1.27 mg/dL — ABNORMAL HIGH (ref 0.61–1.24)
GFR, Estimated: 53 mL/min — ABNORMAL LOW
Glucose, Bld: 187 mg/dL — ABNORMAL HIGH (ref 70–99)
Potassium: 4 mmol/L (ref 3.5–5.1)
Sodium: 144 mmol/L (ref 135–145)

## 2024-04-22 LAB — MAGNESIUM: Magnesium: 1.9 mg/dL (ref 1.7–2.4)

## 2024-04-22 LAB — HEMOGLOBIN AND HEMATOCRIT, BLOOD
HCT: 22.5 % — ABNORMAL LOW (ref 39.0–52.0)
HCT: 21.3 % — ABNORMAL LOW (ref 39.0–52.0)
Hemoglobin: 7.6 g/dL — ABNORMAL LOW (ref 13.0–17.0)
Hemoglobin: 7.3 g/dL — ABNORMAL LOW (ref 13.0–17.0)

## 2024-04-22 LAB — ECHOCARDIOGRAM COMPLETE
AR max vel: 2.67 cm2
AV Peak grad: 17.3 mmHg
Ao pk vel: 2.08 m/s
Height: 66 in
S' Lateral: 3 cm
Weight: 3152 [oz_av]

## 2024-04-22 LAB — PREPARE RBC (CROSSMATCH)

## 2024-04-22 LAB — TRIGLYCERIDES: Triglycerides: 79 mg/dL

## 2024-04-22 MED ORDER — MIDAZOLAM HCL (PF) 2 MG/2ML IJ SOLN
2.0000 mg | Freq: Once | INTRAMUSCULAR | Status: AC
  Administered 2024-04-22: 2 mg via INTRAVENOUS

## 2024-04-22 MED ORDER — MIDAZOLAM HCL 2 MG/2ML IJ SOLN
INTRAMUSCULAR | Status: AC
  Filled 2024-04-22: qty 2

## 2024-04-22 MED ORDER — SODIUM CHLORIDE 0.9 % IV SOLN
2.0000 g | Freq: Two times a day (BID) | INTRAVENOUS | Status: DC
  Administered 2024-04-22 – 2024-04-27 (×12): 2 g via INTRAVENOUS
  Filled 2024-04-22 (×12): qty 12.5

## 2024-04-22 MED ORDER — AMIODARONE LOAD VIA INFUSION
150.0000 mg | Freq: Once | INTRAVENOUS | Status: AC
  Administered 2024-04-22: 150 mg via INTRAVENOUS
  Filled 2024-04-22: qty 83.34

## 2024-04-22 MED ORDER — AMIODARONE HCL IN DEXTROSE 360-4.14 MG/200ML-% IV SOLN
60.0000 mg/h | INTRAVENOUS | Status: DC
  Administered 2024-04-22: 60 mg/h via INTRAVENOUS
  Filled 2024-04-22: qty 200

## 2024-04-22 MED ORDER — SODIUM CHLORIDE 0.9 % IV SOLN
4.0000 g | Freq: Once | INTRAVENOUS | Status: AC
  Administered 2024-04-22: 4 g via INTRAVENOUS
  Filled 2024-04-22: qty 40

## 2024-04-22 MED ORDER — AMIODARONE HCL IN DEXTROSE 360-4.14 MG/200ML-% IV SOLN
30.0000 mg/h | INTRAVENOUS | Status: DC
  Administered 2024-04-23: 30 mg/h via INTRAVENOUS
  Filled 2024-04-22: qty 200

## 2024-04-22 MED ORDER — SODIUM CHLORIDE 0.9 % IV SOLN: INTRAVENOUS | Status: AC

## 2024-04-22 MED ORDER — HYDROCORTISONE SOD SUC (PF) 100 MG IJ SOLR
100.0000 mg | Freq: Three times a day (TID) | INTRAMUSCULAR | Status: DC
  Administered 2024-04-22 – 2024-04-25 (×9): 100 mg via INTRAVENOUS
  Filled 2024-04-22 (×9): qty 2

## 2024-04-22 MED ORDER — PANTOPRAZOLE SODIUM 40 MG IV SOLR
40.0000 mg | INTRAVENOUS | Status: DC
  Administered 2024-04-22 – 2024-05-04 (×13): 40 mg via INTRAVENOUS
  Filled 2024-04-22 (×13): qty 10

## 2024-04-22 MED ORDER — SODIUM CHLORIDE 0.9% IV SOLUTION: Freq: Once | INTRAVENOUS | Status: AC

## 2024-04-22 MED ORDER — LACTATED RINGERS IV BOLUS
1000.0000 mL | Freq: Once | INTRAVENOUS | Status: AC
  Administered 2024-04-22: 1000 mL via INTRAVENOUS

## 2024-04-22 MED ORDER — LACTATED RINGERS IV BOLUS
500.0000 mL | Freq: Once | INTRAVENOUS | Status: AC
  Administered 2024-04-22: 500 mL via INTRAVENOUS

## 2024-04-22 MED ORDER — SODIUM CHLORIDE 0.9 % IV SOLN: INTRAVENOUS | Status: DC

## 2024-04-22 MED ORDER — AMIODARONE HCL IN DEXTROSE 360-4.14 MG/200ML-% IV SOLN
INTRAVENOUS | Status: AC
  Administered 2024-04-23: 60 mg/h via INTRAVENOUS
  Filled 2024-04-22: qty 200

## 2024-04-22 NOTE — Progress Notes (Addendum)
 eLink Physician-Brief Progress Note Patient Name: Tom Norton DOB: 09/30/1929 MRN: [id number]   Date of Service  04/22/2024  HPI/Events of Note  89 year old male initially presented as a polytrauma with multiple orthopedic injuries, CCM consulted for hypoxemia and shock.  Notified that shortly after his bath, the patient developed A-fib with rapid ventricular response into the 170s-80s with escalating vasopressor requirements.  CVP between 5-8.  On norepinephrine , phenylephrine , and vasopressin .  eICU Interventions  Will initiate amiodarone  bolus with drip  500 cc LR bolus.  Full code-May require cardioversion if he fails to respond to chemical therapy.   2346 - initially responded to amio and fluids but bp is dropping. 3x pressors near max. Referred to ground team for potential cardioversion.  2358 -successful DC cardioversion 120 J at 2358, return to sinus rhythm and improving BP.  Trauma team notified of critical illness and okay with any necessary interventions.  Will obtain ABG as well.  Intervention Category Intermediate Interventions: Arrhythmia - evaluation and management  Emerly Prak 04/22/2024, 10:36 PM

## 2024-04-22 NOTE — Discharge Instructions (Signed)
 "  Postoperative instructions:  Weightbearing instructions: non weight bearing  Keep your dressing and/or splint clean and dry at all times.  You can remove your dressing on post-operative day #3 and change with a dry/sterile dressing or Band-Aids as needed thereafter.    Incision instructions:  Do not soak your incision for 3 weeks after surgery.  If the incision gets wet, pat dry and do not scrub the incision.  Pain control:  You have been given a prescription to be taken as directed for post-operative pain control.  In addition, elevate the operative extremity above the heart at all times to prevent swelling and throbbing pain.  Take over-the-counter Colace, 100mg  by mouth twice a day while taking narcotic pain medications to help prevent constipation.  Follow up appointments: 1) 14 days for suture removal and wound check. 2) Dr. Benjiman PA, Morna Grave   -------------------------------------------------------------------------------------------------------------  After Surgery Pain Control:  After your surgery, post-surgical discomfort or pain is likely. This discomfort can last several days to a few weeks. At certain times of the day your discomfort may be more intense.  Did you receive a nerve block?  A nerve block can provide pain relief for one hour to two days after your surgery. As long as the nerve block is working, you will experience little or no sensation in the area the surgeon operated on.  As the nerve block wears off, you will begin to experience pain or discomfort. It is very important that you begin taking your prescribed pain medication before the nerve block fully wears off. Treating your pain at the first sign of the block wearing off will ensure your pain is better controlled and more tolerable when full-sensation returns. Do not wait until the pain is intolerable, as the medicine will be less effective. It is better to treat pain in advance than to try and catch up.   General Anesthesia:  If you did not receive a nerve block during your surgery, you will need to start taking your pain medication shortly after your surgery and should continue to do so as prescribed by your surgeon.  Pain Medication:  Most commonly we prescribe Vicodin and Percocet for post-operative pain. Both of these medications contain a combination of acetaminophen  (Tylenol ) and a narcotic to help control pain.   It takes between 30 and 45 minutes before pain medication starts to work. It is important to take your medication before your pain level gets too intense.   Nausea is a common side effect of many pain medications. You will want to eat something before taking your pain medicine to help prevent nausea.   If you are taking a prescription pain medication that contains acetaminophen , we recommend that you do not take additional over the counter acetaminophen  (Tylenol ).  Other pain relieving options:   Using a cold pack to ice the affected area a few times a day (15 to 20 minutes at a time) can help to relieve pain, reduce swelling and bruising.   Elevation of the affected area can also help to reduce pain and swelling.  Per [facility] clinic policy, our goal is ensure optimal postoperative pain control with a multimodal pain management strategy. For all [facility] patients, our goal is to wean post-operative narcotic medications by 6 weeks post-operatively. If this is not possible due to utilization of pain medication prior to surgery, your [facility] doctor will support your acute post-operative pain control for the first 6 weeks postoperatively, with a plan to transition you back  to your primary pain team following that. Maralee will work to ensure a therapist, occupational.  "

## 2024-04-22 NOTE — Progress Notes (Signed)
 Call received from radiology office with results from femur xray taken on 12/29 22:22. Impression relayed to [facility]. This RN then called ortho PA Morna Moles to relay impression. PA stated no concerns at this time

## 2024-04-22 NOTE — Progress Notes (Signed)
 Subjective: 1 Day Post-Op Procedures (LRB): FIXATION, FRACTURE, INTERTROCHANTERIC, WITH INTRAMEDULLARY ROD (Right) Patient on ventilator.  Son and nurse at beside.  Concern for swelling to the RLE  Objective: Vital signs in last 24 hours: Temp:  [96.8 F (36 C)-98.3 F (36.8 C)] 98.2 F (36.8 C) (12/30 0400) Pulse Rate:  [47-104] 104 (12/30 0755) Resp:  [0-29] 11 (12/30 0755) BP: (75-127)/(37-82) 87/62 (12/30 0700) SpO2:  [90 %-100 %] 100 % (12/30 0800) Arterial Line BP: (84-148)/(37-79) 110/54 (12/30 0715) FiO2 (%):  [40 %-50 %] 40 % (12/30 0800)  Intake/Output from previous day: 12/29 0701 - 12/30 0700 In: 7272.9 [I.V.:4483.2; Blood:934; NG/GT:120; IV Piggyback:1735.7] Out: 2470 [Urine:2270; Blood:200] Intake/Output this shift: Total I/O In: -  Out: 35 [Urine:35]  Recent Labs    04/20/24 2244 04/21/24 0521 04/21/24 1546 04/21/24 2224 04/22/24 0306  HGB 8.2* 8.3* 5.8* 8.8* 7.3*   Recent Labs    04/21/24 0521 04/21/24 1546 04/21/24 2224 04/22/24 0306  WBC 6.7  --   --  10.5  RBC 2.61*  --   --  2.40*  HCT 24.7*   < > 26.3* 21.4*  PLT 101*  --   --  84*   < > = values in this interval not displayed.   Recent Labs    04/21/24 0521 04/21/24 1546 04/22/24 0306  NA 147* 146* 144  K 3.7 3.7 4.0  CL 119*  --  115*  CO2 20*  --  19*  BUN 28*  --  34*  CREATININE 0.98  --  1.27*  GLUCOSE 112*  --  187*  CALCIUM  8.1*  --  7.3*   No results for input(s): LABPT, INR in the last 72 hours.  PHYSICAL EXAM Bandages- c/d/i RLE- moderate edema throughout the entire RLE.   PT/DP pulses intact     Assessment/Plan: 1 Day Post-Op Procedures (LRB): FIXATION, FRACTURE, INTERTROCHANTERIC, WITH INTRAMEDULLARY ROD (Right) NWB RLE Will order u/s RLE r/o dvt Ok from ortho standpoint to resume anticoagulant, but will defer restarting to trauma service Will need to f/u with ortho two weeks post-op for staple removal       [name] LITTIE Grave 04/22/2024, 8:12  AM

## 2024-04-22 NOTE — Progress Notes (Signed)
 "  Trauma/Critical Care Follow Up Note  Subjective:    Overnight Issues: Hgb slight drift. 3 vasopressors on board when I arrived today  Objective:  Vital signs for last 24 hours: Temp:  [96.8 F (36 C)-98.3 F (36.8 C)] 98.2 F (36.8 C) (12/30 0400) Pulse Rate:  [47-112] 108 (12/30 0815) Resp:  [0-29] 0 (12/30 0815) BP: (75-127)/(37-82) 111/72 (12/30 0800) SpO2:  [90 %-100 %] 100 % (12/30 0815) Arterial Line BP: (84-148)/(37-79) 110/60 (12/30 0815) FiO2 (%):  [40 %-50 %] 40 % (12/30 0800)  Intake/Output from previous day: 12/29 0701 - 12/30 0700 In: 7272.9 [I.V.:4483.2; Blood:934; NG/GT:120; IV Piggyback:1735.7] Out: 2470 [Urine:2270; Blood:200]  Intake/Output this shift: Total I/O In: -  Out: 35 [Urine:35]  Vent settings for last 24 hours: Vent Mode: PSV;CPAP FiO2 (%):  [40 %-50 %] 40 % Set Rate:  [16 bmp] 16 bmp Vt Set:  [510 mL] 510 mL PEEP:  [5 cmH20] 5 cmH20 Pressure Support:  [10 cmH20] 10 cmH20 Plateau Pressure:  [4 cmH20-15 cmH20] 4 cmH20  Physical Exam:  Gen: Appears comfortable, minimally responsive, currently on nonrebreather satting 96 to 97% Neuro: Intermittently following commands, HEENT: PERRL Neck: supple CV: RRR Pulm: unlabored breathing on nonrebreather, currently satting mid 90s Abd: soft, NT  , no recent BM GU: urine clear and yellow, +spontaneous voids Extr: wwp, no edema  Results for orders placed or performed during the hospital encounter of 04/17/24 (from the past 24 hours)  Prepare RBC (crossmatch)     Status: None   Collection Time: 04/21/24  1:41 PM  Result Value Ref Range   Order Confirmation      ORDER PROCESSED BY BLOOD BANK Performed at [facility], [address] [place], [place] [address]   I-STAT 7, (LYTES, BLD GAS, ICA, H+H)     Status: Abnormal   Collection Time: 04/21/24  3:46 PM  Result Value Ref Range   pH, Arterial 7.410 7.35 - 7.45   pCO2 arterial 28.8 (L) 32 - 48 mmHg   pO2, Arterial 85 83 - 108 mmHg    Bicarbonate 18.3 (L) 20.0 - 28.0 mmol/L   TCO2 19 (L) 22 - 32 mmol/L   O2 Saturation 97 %   Acid-base deficit 6.0 (H) 0.0 - 2.0 mmol/L   Sodium 146 (H) 135 - 145 mmol/L   Potassium 3.7 3.5 - 5.1 mmol/L   Calcium , Ion 1.21 1.15 - 1.40 mmol/L   HCT 17.0 (L) 39.0 - 52.0 %   Hemoglobin 5.8 (LL) 13.0 - 17.0 g/dL   Patient temperature 63.4 C    Collection site art line    Drawn by Operator    Sample type ARTERIAL    Comment NOTIFIED PHYSICIAN   Glucose, capillary     Status: Abnormal   Collection Time: 04/21/24  3:59 PM  Result Value Ref Range   Glucose-Capillary 218 (H) 70 - 99 mg/dL  Prepare RBC (crossmatch)     Status: None   Collection Time: 04/21/24  4:12 PM  Result Value Ref Range   Order Confirmation      ORDER PROCESSED BY BLOOD BANK Performed at [facility], [address] [place], [place] [address]   Glucose, capillary     Status: Abnormal   Collection Time: 04/21/24  8:05 PM  Result Value Ref Range   Glucose-Capillary 201 (H) 70 - 99 mg/dL  Hemoglobin and hematocrit, blood     Status: Abnormal   Collection Time: 04/21/24 10:24 PM  Result  Value Ref Range   Hemoglobin 8.8 (L) 13.0 - 17.0 g/dL   HCT 73.6 (L) 60.9 - 47.9 %  Glucose, capillary     Status: Abnormal   Collection Time: 04/21/24 11:33 PM  Result Value Ref Range   Glucose-Capillary 173 (H) 70 - 99 mg/dL  CBC     Status: Abnormal   Collection Time: 04/22/24  3:06 AM  Result Value Ref Range   WBC 10.5 4.0 - 10.5 K/uL   RBC 2.40 (L) 4.22 - 5.81 MIL/uL   Hemoglobin 7.3 (L) 13.0 - 17.0 g/dL   HCT 78.5 (L) 60.9 - 47.9 %   MCV 89.2 80.0 - 100.0 fL   MCH 30.4 26.0 - 34.0 pg   MCHC 34.1 30.0 - 36.0 g/dL   RDW 82.3 (H) 88.4 - 84.4 %   Platelets 84 (L) 150 - 400 K/uL   nRBC 1.4 (H) 0.0 - 0.2 %  Basic metabolic panel     Status: Abnormal   Collection Time: 04/22/24  3:06 AM  Result Value Ref Range   Sodium 144 135 - 145 mmol/L   Potassium 4.0 3.5 - 5.1 mmol/L   Chloride 115 (H) 98 - 111 mmol/L   CO2 19  (L) 22 - 32 mmol/L   Glucose, Bld 187 (H) 70 - 99 mg/dL   BUN 34 (H) 8 - 23 mg/dL   Creatinine, Ser 8.72 (H) 0.61 - 1.24 mg/dL   Calcium  7.3 (L) 8.9 - 10.3 mg/dL   GFR, Estimated 53 (L) >60 mL/min   Anion gap 9 5 - 15  Magnesium      Status: None   Collection Time: 04/22/24  3:06 AM  Result Value Ref Range   Magnesium  1.9 1.7 - 2.4 mg/dL  Phosphorus     Status: None   Collection Time: 04/22/24  3:06 AM  Result Value Ref Range   Phosphorus 3.6 2.5 - 4.6 mg/dL  Triglycerides     Status: None   Collection Time: 04/22/24  3:06 AM  Result Value Ref Range   Triglycerides 79 <150 mg/dL  Glucose, capillary     Status: Abnormal   Collection Time: 04/22/24  3:35 AM  Result Value Ref Range   Glucose-Capillary 182 (H) 70 - 99 mg/dL  Glucose, capillary     Status: Abnormal   Collection Time: 04/22/24  7:35 AM  Result Value Ref Range   Glucose-Capillary 163 (H) 70 - 99 mg/dL  Prepare RBC (crossmatch)     Status: None   Collection Time: 04/22/24  9:00 AM  Result Value Ref Range   Order Confirmation      ORDER PROCESSED BY BLOOD BANK Performed at [facility], [address], [place], [place] [address]     Assessment & Plan:  LOS: 5 days   Additional comments:I reviewed the patient's new clinical lab test results.   and I reviewed the patients new imaging test results.    89 y.o. male s/p fall   Known Injuries: - Right femur fracture - SDH/SAH - Nondepressed parietoocipital bone fracture - Trace left basilar pneumothorax - Bilateral rib fractures - Thoracic TP fractures - Possible T9-10 discoligamentous injury - Left psoas hemorrhage without extravasation   Plan  - Admit to trauma service - Appreciate neurosurgery recommendations, Dr. Garst              - MRI thoracic spine when able              - Repeat [facility] 12/28 with  stable ICH, but new occipital stroke, Dr. Merrianne consulted.  - q1h neuro check - CTA Neck pending to evaluate for embolic source - Appreciate  orthopaedics recommendations, Dr. Jerri, Dr. Kendal - ORIF right femur 12/29 (Dr. Jerri)  - Acute blood loss anemia - in setting of shock, reviewed with CCM, will plan 1U PRBC this morning - Shock - potentially volume, giving unit PRBC, monitor response, CCM also checking CVP and echo                - FEN - strict NPO, cortrak, SLP eval - DVT - SCDs, holding chemical ppx due to bleeding concerns - Dr. Annella (CCM) consulted for assistance in critical care mgmt as well - greatly appreciate assistance in his care  - Dispo - ICU, clinical update provided to family at bedside this AM. Prior prior notes Family requested transfer to [facility] previously which Dr. Paola initiated, and he was initially accepted and then rescinded. Continue current care.   Critical Care Total Time: 35 minutes  Lonni CHRISTELLA Pizza, MD Trauma & General Surgery Please use [email or web address] to contact on call provider  04/22/2024  *Care during the described time interval was provided by me. I have reviewed this patient's available data, including medical history, events of note, physical examination and test results as part of my evaluation.    "

## 2024-04-22 NOTE — Plan of Care (Signed)
  Problem: Coping: Goal: Level of anxiety will decrease Outcome: Progressing   Problem: Pain Managment: Goal: General experience of comfort will improve and/or be controlled Outcome: Progressing   Problem: Safety: Goal: Ability to remain free from injury will improve Outcome: Progressing   Problem: Skin Integrity: Goal: Risk for impaired skin integrity will decrease Outcome: Progressing   Problem: Safety: Goal: Non-violent Restraint(s) Outcome: Progressing

## 2024-04-22 NOTE — Progress Notes (Signed)
 RLE venous duplex has been completed.   Results can be found under chart review under CV PROC. 04/22/2024 5:40 PM Shemeika Starzyk RVT, RDMS

## 2024-04-22 NOTE — Progress Notes (Signed)
 Patient ID: Tom Norton, male   DOB: 04/15/1930, 89 y.o.   MRN: [id number] Patient is seen in follow-up for intramedullary nailing of a right femur fracture he is currently in the ICU sedated intubated.  Patient is developing increased swelling in the right thigh there is ecchymosis and bruising.  There is clear serosanguineous drainage from the distal interlocking screw holes.  The calf is soft with some small blisters.  No signs of compartment syndrome in the calf.  The thigh is swollen but is soft to palpation no evidence of compartment syndrome in the thigh.  Would continue transfusing blood products as needed.  No surgical intervention indicated at this time.

## 2024-04-22 NOTE — Progress Notes (Signed)
 PT Cancellation Note  Patient Details Name: Tom Norton MRN: [id number] DOB: Feb 06, 1930   Cancelled Treatment:    Reason Eval/Treat Not Completed: Patient not medically ready. Pt currently on 3 pressors, intubated, and not waking up despite minimal sedation. Acute PT to return as appropriate, as able to complete PT eval.  Norene Ames, PT, DPT Acute Rehabilitation Services Secure chat preferred Office #: [phone]    Norene CHRISTELLA Ames 04/22/2024, 8:28 AM

## 2024-04-22 NOTE — Progress Notes (Signed)
 " Daily Progress Note   Date: 04/22/2024   Patient Name: Tom Norton  DOB: 01/15/1930  MRN: [id number]  Age / Sex: 89 y.o., male  Attending Physician: Tom Seltzer, MD Primary Care Physician: Tom Norton LABOR, MD Admit Date: 04/17/2024 Length of Stay: 5 days  Reason for Follow-up: Establishing goals of care  Past Medical History:  Diagnosis Date   BPH (benign prostatic hyperplasia)    Cardiomegaly    Dilated aortic root    a. 09/2013: 4.2cm by echo.   Dysrhythmia    Enlarged prostate without lower urinary tract symptoms (luts)    GERD (gastroesophageal reflux disease)    rare   History of hiatal hernia    Hyperlipidemia    Hypertension    Hypothyroidism    Insomnia    Junctional bradycardia    OA (osteoarthritis)    Obesity    Paroxysmal A-fib (HCC)    a. 09/23/13 a-fib with RVR and hypotensive, requiring emergent cardioversion in ED   Peripheral neuropathy 07/17/2017   Vitamin D  deficiency     Assessment & Plan:   HPI/Patient Profile:   89 y.o. male  with past medical history of HLD, HTN, osteoarthritis, paroxysmal atrial fibrillation on eliquis  admitted on 04/17/2024  with SDH, SAH, and right femur fracture 2/2 fall from balcony. CT head 04/17/2024 w/o contrast demonstrated left posterior SDH up to 9 mm thickness, multifocal bilateral SAH, linear skull fracture of left parietooccipital bone, right posterior scalp hematoma. Repeat CT head on 04/18/2024 demonstrate increase in multifocal hemorrhage, now bilateral SDH, increased SAH volume, minimal midline shift has increased. Per neurosurgery note by Darnella MD on 04/17/2024 patient not a candidate for hematoma evacuation surgery if his hemorrhages were to expand.    Palliative medicine consulted for goals of care conversation.  SUMMARY OF RECOMMENDATIONS Full code, full scope Palliative medicine team will continue to follow for outcomes and further goals of care conversation Healthcare power of attorney in order Tom Norton, Tom Norton, Tom Norton)  Symptom Management:  Per primary team  Code Status: Full Code  Prognosis: Unable to determine  Discharge Planning: To Be Determined  Subjective:   Subjective: Chart Reviewed. Updates received. Patient Assessed. Created space and opportunity for patient  and family to explore thoughts and feelings regarding current medical situation.  Today's Discussion:  Met with the patient at the bedside who is not interactive today compared to prior due to being intubated and sedated.  Son and wife are at bedside.  Answered some questions for the family including vasopressor requirements and some light neck steps regarding further imaging of the patient's head.  Son and wife are well aware that the patient is critically ill at this time due to ventilator needs as well as vasopressor requirements.  Son shares that the patient is still on too many vasopressors at this time which is a barrier to him going down to get his head scan to assess his multifocal brain hemorrhages.  Wife and son remain hopeful that the patient can recover enough to pursue rehab.  They did inquire about prolonged intubation and shared with them that all the goal is always to get the breathing tube out as soon as possible as sometimes it is more prudent to keep it in place in order to pursue other imaging while the patient has a stable airway and can be sedated so that the patient can comfortably tolerate said imaging.   Review of Systems  Unable to perform ROS  Objective:   Primary Diagnoses: Present on Admission:  Critical polytrauma   Vital Signs:  BP 111/72   Pulse (!) 104   Temp 100.2 F (37.9 C) (Axillary)   Resp 20   Ht 5' 6 (1.676 m)   Wt 89.4 kg   SpO2 100%   BMI 31.80 kg/m   Physical Exam Constitutional:      Appearance: He is ill-appearing.  HENT:     Head: Normocephalic.     Nose: Nose normal.     Mouth/Throat:     Comments: ETT in  place Cardiovascular:     Rate and Rhythm: Tachycardia present.     Pulses: Normal pulses.  Pulmonary:     Comments: On PSV Skin:    General: Skin is warm and dry.  Neurological:     Comments: Intubated and sedated    Palliative Assessment/Data: 30%   Existing Vynca/ACP Documentation: AD and HCPOA in place  Thank you for allowing us  to participate in the care of Tom Norton will continue to support holistically.  I personally spent a total of 35 minutes in the care of the patient today including preparing to see the patient, getting/reviewing separately obtained history, performing a medically appropriate exam/evaluation, counseling and educating, and documenting clinical information in the EHR.   Tom Norton Tom Norton  Palliative Medicine Team  Team Phone # [phone] (Nights/Weekends) 04/22/2024 5:21 PM  "

## 2024-04-22 NOTE — Progress Notes (Signed)
 Echocardiogram 2D Echocardiogram has been performed.  Tom Norton 04/22/2024, 9:55 AM

## 2024-04-22 NOTE — Progress Notes (Signed)
 OT Cancellation Note  Patient Details Name: Tom Norton MRN: [id number] DOB: 1929-05-14   Cancelled Treatment:    Reason Eval/Treat Not Completed: Patient not medically ready (OT to follow up once medically stable.)  Dawnielle Christiana D Causey 04/22/2024, 8:54 AM

## 2024-04-22 NOTE — Consult Note (Signed)
 "  NAME:  Tom Norton, MRN:  [id number], DOB:  07/18/1929, LOS: 5 ADMISSION DATE:  04/17/2024, CONSULTATION DATE:  04/22/2024 REFERRING MD:  Trauma, CHIEF COMPLAINT:  shock   History of Present Illness:  89 year old man admitted as polytrauma after fall from deck with multiple orthopedic injuries, fracture, whom we are consulted for hypoxemia and shock.  Patient was admitted Christmas Day after fall from deck per report.  Initial images reveal left-sided rib fractures, right femur fracture, possible iliac fractures, left posterior subdural hematoma no midline shift, small left posterior parafalcine subdural hematoma, traumatic subarachnoid hemorrhage, skull fracture, trace left apical pneumothorax.  Chest x-ray 12/28 demonstrated left-sided atelectasis and small pleural effusion on my review interpretation.  He went for femur repair 12/29.  He named and remains intubated after procedure.  Chest x-ray 12/29 reveals ET tube placement, similar right-sided infiltrates and atelectasis, new rounded left-sided infiltrate on my review interpretation.  He developed shock now on maximum dose norepinephrine , vasopressin , started on phenylephrine  overnight.  He remains intubated.  Minimal vent settings.  He is on Precedex  for sedation.  Weaning Precedex .  Blood pressure get a bit better.  Review of labs reveal serial low hemoglobins.  He is getting a transfusion today.  He has had transfusions earlier this admission.  Pertinent  Medical History    Significant Hospital Events: Including procedures, antibiotic start and stop dates in addition to other pertinent events   12/25 admitted after fall 12/29 OR fro femur fracture repair   Interim History / Subjective:    Objective    Blood pressure 118/71, pulse 97, temperature 98.4 F (36.9 C), temperature source Axillary, resp. rate (!) 22, height 5' 6 (1.676 m), weight 89.4 kg, SpO2 100%. CVP:  [0 mmHg-18 mmHg] 9 mmHg  Vent Mode: PSV;CPAP FiO2 (%):  [40  %-50 %] 40 % Set Rate:  [16 bmp] 16 bmp Vt Set:  [510 mL] 510 mL PEEP:  [5 cmH20] 5 cmH20 Pressure Support:  [10 cmH20] 10 cmH20 Plateau Pressure:  [4 cmH20-15 cmH20] 4 cmH20   Intake/Output Summary (Last 24 hours) at 04/22/2024 1028 Last data filed at 04/22/2024 1015 Gross per 24 hour  Intake 7361.32 ml  Output 1770 ml  Net 5591.32 ml   Filed Weights   04/17/24 1700  Weight: 89.4 kg    Examination: General: elderly, frail appearing Lungs: coarse ventilated sounds Cardiovascular: tachy, RR Abdomen: ND Extremities: UE edema, RLE pitting edema mid shin, L leg minimal edema Neuro: Sedated, does not follow commands   Resolved problem list   Assessment and Plan   Shock: Presumed distributive or hypovolemic.  Requiring blood transfusions, recent anemia.  Postoperative state.  Critical illness for bone marrow response etc.  Also with what appears to be new infiltrate left lower lobe on chest x-ray 12/29.  Septic shock possible with pulmonary source. -- MAP goal greater than 65, currently on vasopressin , norepinephrine , phenylephrine , weaning phenylephrine , first pressor to wean off -- Tracheal aspirate culture, start cefepime  12/30 -- Start stress dose steroids -- Stop antihypertensives on MAR -- Check CVP -- TTE poor windows but nothing catastrophic  Acute hypoxemic respiratory failure: Preceding intubation, remains intubated after procedure 12/29.  Chest x-ray consistent with atelectasis.  Suspect some element of pleural effusion, volume overload, third spacing. -- PRVC, VAP bundle, stress ulcer prophylaxis -- Will check CVP, I suspect he is acting intravascularly dry but if CVP elevated would trial diuresis  CKD: Creatinine is wax and wane, worse today but near recent baseline. --  Start maintenance IV fluids with third spacing -- If CVP is low, will bolus, volume expand  Rest per primary service.  PCCM will continue to follow.  Labs   CBC: Recent Labs  Lab  04/19/24 0114 04/19/24 0527 04/20/24 0551 04/20/24 0814 04/20/24 2244 04/21/24 0521 04/21/24 1546 04/21/24 2224 04/22/24 0306  WBC 9.7 9.9 8.4  --   --  6.7  --   --  10.5  HGB 8.2* 8.2* 7.2*   < > 8.2* 8.3* 5.8* 8.8* 7.3*  HCT 23.6* 23.2* 21.2*   < > 24.0* 24.7* 17.0* 26.3* 21.4*  MCV 91.1 90.6 95.1  --   --  94.6  --   --  89.2  PLT 92* 100* 96*  --   --  101*  --   --  84*   < > = values in this interval not displayed.    Basic Metabolic Panel: Recent Labs  Lab 04/18/24 0327 04/19/24 0527 04/20/24 0551 04/20/24 0814 04/20/24 1449 04/20/24 2244 04/21/24 0521 04/21/24 1546 04/22/24 0306  NA 135 142 145   < > 146* 148* 147* 146* 144  K 3.7 3.8 3.9   < > 3.6 3.2* 3.7 3.7 4.0  CL 107 114* 118*  --   --   --  119*  --  115*  CO2 17* 20* 20*  --   --   --  20*  --  19*  GLUCOSE 402* 133* 133*  --   --   --  112*  --  187*  BUN 20 26* 31*  --   --   --  28*  --  34*  CREATININE 1.06 1.04 0.99  --   --   --  0.98  --  1.27*  CALCIUM  7.7* 8.6* 8.2*  --   --   --  8.1*  --  7.3*  MG 1.6* 2.5* 2.4  --   --   --  2.3  --  1.9  PHOS 3.3 2.5 2.1*  --   --   --  1.9*  --  3.6   < > = values in this interval not displayed.   GFR: Estimated Creatinine Clearance: 38.8 mL/min (A) (by C-G formula based on SCr of 1.27 mg/dL (H)). Recent Labs  Lab 04/17/24 1657 04/17/24 2347 04/19/24 0527 04/20/24 0551 04/21/24 0521 04/22/24 0306  WBC  --    < > 9.9 8.4 6.7 10.5  LATICACIDVEN 2.9*  --   --   --   --   --    < > = values in this interval not displayed.    Liver Function Tests: Recent Labs  Lab 04/17/24 1648  AST 51*  ALT 28  ALKPHOS 77  BILITOT 0.5  PROT 5.8*  ALBUMIN  3.7   No results for input(s): LIPASE, AMYLASE in the last 168 hours. No results for input(s): AMMONIA in the last 168 hours.  ABG    Component Value Date/Time   PHART 7.410 04/21/2024 1546   PCO2ART 28.8 (L) 04/21/2024 1546   PO2ART 85 04/21/2024 1546   HCO3 18.3 (L) 04/21/2024 1546   TCO2  19 (L) 04/21/2024 1546   ACIDBASEDEF 6.0 (H) 04/21/2024 1546   O2SAT 97 04/21/2024 1546     Coagulation Profile: Recent Labs  Lab 04/17/24 1648  INR 1.3*    Cardiac Enzymes: No results for input(s): CKTOTAL, CKMB, CKMBINDEX, TROPONINI in the last 168 hours.  HbA1C: Hgb A1c MFr Bld  Date/Time Value Ref Range Status  04/18/2024 02:13 PM 5.4 4.8 - 5.6 % Final    Comment:    (NOTE) Diagnosis of Diabetes The following HbA1c ranges recommended by the American Diabetes Association (ADA) may be used as an aid in the diagnosis of diabetes mellitus.  Hemoglobin             Suggested A1C NGSP%              Diagnosis  <5.7                   Non Diabetic  5.7-6.4                Pre-Diabetic  >6.4                   Diabetic  <7.0                   Glycemic control for                       adults with diabetes.    09/23/2013 03:00 PM 5.5 <5.7 % Final    Comment:    (NOTE)                                                                       According to the ADA Clinical Practice Recommendations for 2011, when HbA1c is used as a screening test:  >=6.5%   Diagnostic of Diabetes Mellitus           (if abnormal result is confirmed) 5.7-6.4%   Increased risk of developing Diabetes Mellitus References:Diagnosis and Classification of Diabetes Mellitus,Diabetes Care,2011,34(Suppl 1):S62-S69 and Standards of Medical Care in         Diabetes - 2011,Diabetes Care,2011,34 (Suppl 1):S11-S61.    CBG: Recent Labs  Lab 04/21/24 1559 04/21/24 2005 04/21/24 2333 04/22/24 0335 04/22/24 0735  GLUCAP 218* 201* 173* 182* 163*    Review of Systems:   Unable to obtain due to patient factors  Past Medical History:  He,  has a past medical history of BPH (benign prostatic hyperplasia), Cardiomegaly, Dilated aortic root, Dysrhythmia, Enlarged prostate without lower urinary tract symptoms (luts), GERD (gastroesophageal reflux disease), History of hiatal hernia, Hyperlipidemia,  Hypertension, Hypothyroidism, Insomnia, Junctional bradycardia, OA (osteoarthritis), Obesity, Paroxysmal A-fib (HCC), Peripheral neuropathy (07/17/2017), and Vitamin D  deficiency.   Surgical History:   Past Surgical History:  Procedure Laterality Date   CARDIOVERSION  2015   CARPAL TUNNEL RELEASE Left 2019   I & D EXTREMITY Left 02/18/2021   Procedure: IRRIGATION AND DEBRIDEMENT KNEE;  Surgeon: Harden Jerona GAILS, MD;  Location: [facility] OR;  Service: Orthopedics;  Laterality: Left;   INGUINAL HERNIA REPAIR Left 05/03/2019   Procedure: HERNIA REPAIR INGUINAL ADULT WITH MESH;  Surgeon: Eletha Boas, MD;  Location: [facility] ORS;  Service: General;  Laterality: Left;   INGUINAL HERNIA REPAIR Left 1980   INGUINAL HERNIA REPAIR Right 09/12/2021   Procedure: OPEN REPAIR RIGHT INGUINAL HERNIA WITH MESH;  Surgeon: Eletha Boas, MD;  Location: [facility] ORS;  Service: General;  Laterality: Right;   TOTAL KNEE ARTHROPLASTY Left 07/20/2017   Procedure: LEFT TOTAL KNEE ARTHROPLASTY;  Surgeon: Gerome Charleston, MD;  Location: [facility] ORS;  Service: Orthopedics;  Laterality:  Left;  Adductor Block   TRANSTHORACIC ECHOCARDIOGRAM  09/23/2013   EF 55-60%; normal wall motion. Moderately dilated aortic root and ascending aorta; mild AI; moderate bi-atrial enlargement     Social History:   reports that he has never smoked. He has never been exposed to tobacco smoke. He has never used smokeless tobacco. He reports current alcohol use of about 10.0 standard drinks of alcohol per week. He reports that he does not use drugs.   Family History:  His family history includes Heart disease in his brother and father; Stroke in his brother.   Allergies Allergies[1]   Home Medications  Prior to Admission medications  Medication Sig Start Date End Date Taking? Authorizing Provider  AREXVY 120 MCG/0.5ML injection Inject 0.5 mLs into the muscle once. 02/27/24  Yes [provider]  CINNAMON PO Take 2,000 mg by mouth daily.   Yes [provider]  Coenzyme Q10 (CO Q10) 100 MG CAPS Take 100 mg by mouth daily.   Yes [provider]  cyanocobalamin  1000 MCG tablet Take 1,000 mcg by mouth daily.   Yes [provider]  ELIQUIS  5 MG TABS tablet TAKE 1 TABLET BY MOUTH 2 TIMES DAILY. 03/24/24  Yes Croitoru, Mihai, MD  eszopiclone  (LUNESTA ) 1 MG TABS tablet Take 1 tablet (1 mg total) by mouth at bedtime. Take immediately before bedtime 02/22/21  Yes Singh, Prashant K, MD  levothyroxine  (SYNTHROID ) 50 MCG tablet Take 50 mcg by mouth daily before breakfast.   Yes [provider]  Multiple Vitamin (MULTIVITAMIN WITH MINERALS) TABS Take 1 tablet by mouth daily.   Yes [provider]  OVER THE COUNTER MEDICATION Take 2 capsules by mouth daily. Quietum Plus Memory supplement   Yes [provider]  pyridOXINE (VITAMIN B6) 100 MG tablet Take 100 mg by mouth daily.   Yes [provider]  tamsulosin  (FLOMAX ) 0.4 MG CAPS capsule Take 0.8 mg by mouth daily.   Yes [provider]     Critical care time:     CRITICAL CARE Performed by: Donnice JONELLE Beals   Total critical care time: 40 minutes  Critical care time was exclusive of separately billable procedures and treating other patients.  Critical care was necessary to treat or prevent imminent or life-threatening deterioration.  Critical care was time spent personally by me on the following activities: development of treatment plan with patient and/or surrogate as well as nursing, discussions with consultants, evaluation of patient's response to treatment, examination of patient, obtaining history from patient or surrogate, ordering and performing treatments and interventions, ordering and review of laboratory studies, ordering and review of radiographic studies, pulse oximetry and re-evaluation of patient's condition.   Donnice JONELLE Beals, MD See TRACEY         [1]  Allergies Allergen Reactions   Bee Venom  Anaphylaxis   Doxycycline  Hyclate Anaphylaxis   Yellow Jacket Venom Anaphylaxis   Atorvastatin Other (See Comments)   Doxycycline  Hyclate     Other Reaction(s): tachycardia/afib   Finasteride     Other Reaction(s): weak urinary stream   Sulfamethoxazole     Other Reaction(s): rash/tachycardia/hypotension   Bactrim [Sulfamethoxazole-Trimethoprim] Hives and Rash   Rivaroxaban  Rash   "

## 2024-04-22 NOTE — Progress Notes (Signed)
 NEUROSURGERY PROGRESS NOTE  S/p CHI and possible  T9-T10  ligamentous injury.  Temp:  [96.8 F (36 C)-99.5 F (37.5 C)] 98.4 F (36.9 C) (12/30 0920) Pulse Rate:  [47-112] 97 (12/30 0920) Resp:  [0-29] 22 (12/30 0920) BP: (75-127)/(37-82) 118/71 (12/30 0920) SpO2:  [90 %-100 %] 100 % (12/30 0920) Arterial Line BP: (84-140)/(37-79) 125/59 (12/30 0920) FiO2 (%):  [40 %-50 %] 40 % (12/30 0800)  Plan: MRI t spine when stable. No new recommendations  Suzen Chiquita Pean, NP 04/22/2024 9:49 AM

## 2024-04-23 DIAGNOSIS — J9601 Acute respiratory failure with hypoxia: Secondary | ICD-10-CM | POA: Diagnosis not present

## 2024-04-23 DIAGNOSIS — D649 Anemia, unspecified: Secondary | ICD-10-CM | POA: Diagnosis not present

## 2024-04-23 DIAGNOSIS — N189 Chronic kidney disease, unspecified: Secondary | ICD-10-CM | POA: Diagnosis not present

## 2024-04-23 DIAGNOSIS — Z515 Encounter for palliative care: Secondary | ICD-10-CM | POA: Diagnosis not present

## 2024-04-23 DIAGNOSIS — Z7189 Other specified counseling: Secondary | ICD-10-CM | POA: Diagnosis not present

## 2024-04-23 DIAGNOSIS — I4891 Unspecified atrial fibrillation: Secondary | ICD-10-CM | POA: Diagnosis not present

## 2024-04-23 DIAGNOSIS — R579 Shock, unspecified: Secondary | ICD-10-CM | POA: Diagnosis not present

## 2024-04-23 DIAGNOSIS — N179 Acute kidney failure, unspecified: Secondary | ICD-10-CM

## 2024-04-23 DIAGNOSIS — T07XXXA Unspecified multiple injuries, initial encounter: Secondary | ICD-10-CM | POA: Diagnosis not present

## 2024-04-23 LAB — CBC
HCT: 21.7 % — ABNORMAL LOW (ref 39.0–52.0)
HCT: 18.3 % — ABNORMAL LOW (ref 39.0–52.0)
HCT: 22.5 % — ABNORMAL LOW (ref 39.0–52.0)
Hemoglobin: 7.4 g/dL — ABNORMAL LOW (ref 13.0–17.0)
Hemoglobin: 6.3 g/dL — CL (ref 13.0–17.0)
Hemoglobin: 7.7 g/dL — ABNORMAL LOW (ref 13.0–17.0)
MCH: 30.6 pg (ref 26.0–34.0)
MCH: 30.9 pg (ref 26.0–34.0)
MCH: 31.8 pg (ref 26.0–34.0)
MCHC: 34.1 g/dL (ref 30.0–36.0)
MCHC: 34.4 g/dL (ref 30.0–36.0)
MCHC: 34.2 g/dL (ref 30.0–36.0)
MCV: 89.7 fL (ref 80.0–100.0)
MCV: 89.7 fL (ref 80.0–100.0)
MCV: 93 fL (ref 80.0–100.0)
Platelets: 75 K/uL — ABNORMAL LOW (ref 150–400)
Platelets: 74 K/uL — ABNORMAL LOW (ref 150–400)
Platelets: 70 K/uL — ABNORMAL LOW (ref 150–400)
RBC: 2.42 MIL/uL — ABNORMAL LOW (ref 4.22–5.81)
RBC: 2.04 MIL/uL — ABNORMAL LOW (ref 4.22–5.81)
RBC: 2.42 MIL/uL — ABNORMAL LOW (ref 4.22–5.81)
RDW: 15.9 % — ABNORMAL HIGH (ref 11.5–15.5)
RDW: 16.5 % — ABNORMAL HIGH (ref 11.5–15.5)
RDW: 15.4 % (ref 11.5–15.5)
WBC: 13.4 K/uL — ABNORMAL HIGH (ref 4.0–10.5)
WBC: 12.5 K/uL — ABNORMAL HIGH (ref 4.0–10.5)
WBC: 11 K/uL — ABNORMAL HIGH (ref 4.0–10.5)
nRBC: 15.2 % — ABNORMAL HIGH (ref 0.0–0.2)
nRBC: 19.6 % — ABNORMAL HIGH (ref 0.0–0.2)
nRBC: 17.8 % — ABNORMAL HIGH (ref 0.0–0.2)

## 2024-04-23 LAB — POCT I-STAT 7, (LYTES, BLD GAS, ICA,H+H)
Acid-base deficit: 8 mmol/L — ABNORMAL HIGH (ref 0.0–2.0)
Bicarbonate: 16.4 mmol/L — ABNORMAL LOW (ref 20.0–28.0)
Calcium, Ion: 1.22 mmol/L (ref 1.15–1.40)
HCT: 17 % — ABNORMAL LOW (ref 39.0–52.0)
Hemoglobin: 5.8 g/dL — CL (ref 13.0–17.0)
O2 Saturation: 97 %
Patient temperature: 98.8
Potassium: 3.7 mmol/L (ref 3.5–5.1)
Sodium: 144 mmol/L (ref 135–145)
TCO2: 17 mmol/L — ABNORMAL LOW (ref 22–32)
pCO2 arterial: 28.4 mmHg — ABNORMAL LOW (ref 32–48)
pH, Arterial: 7.37 (ref 7.35–7.45)
pO2, Arterial: 91 mmHg (ref 83–108)

## 2024-04-23 LAB — GLUCOSE, CAPILLARY
Glucose-Capillary: 202 mg/dL — ABNORMAL HIGH (ref 70–99)
Glucose-Capillary: 153 mg/dL — ABNORMAL HIGH (ref 70–99)
Glucose-Capillary: 139 mg/dL — ABNORMAL HIGH (ref 70–99)
Glucose-Capillary: 139 mg/dL — ABNORMAL HIGH (ref 70–99)
Glucose-Capillary: 131 mg/dL — ABNORMAL HIGH (ref 70–99)
Glucose-Capillary: 135 mg/dL — ABNORMAL HIGH (ref 70–99)

## 2024-04-23 LAB — PHOSPHORUS: Phosphorus: 3.9 mg/dL (ref 2.5–4.6)

## 2024-04-23 LAB — MAGNESIUM: Magnesium: 2 mg/dL (ref 1.7–2.4)

## 2024-04-23 LAB — BASIC METABOLIC PANEL WITH GFR
Anion gap: 9 (ref 5–15)
BUN: 42 mg/dL — ABNORMAL HIGH (ref 8–23)
CO2: 17 mmol/L — ABNORMAL LOW (ref 22–32)
Calcium: 7.3 mg/dL — ABNORMAL LOW (ref 8.9–10.3)
Chloride: 116 mmol/L — ABNORMAL HIGH (ref 98–111)
Creatinine, Ser: 1.48 mg/dL — ABNORMAL HIGH (ref 0.61–1.24)
GFR, Estimated: 44 mL/min — ABNORMAL LOW
Glucose, Bld: 194 mg/dL — ABNORMAL HIGH (ref 70–99)
Potassium: 4 mmol/L (ref 3.5–5.1)
Sodium: 142 mmol/L (ref 135–145)

## 2024-04-23 LAB — PREPARE RBC (CROSSMATCH)

## 2024-04-23 LAB — HEMOGLOBIN AND HEMATOCRIT, BLOOD
HCT: 18.1 % — ABNORMAL LOW (ref 39.0–52.0)
Hemoglobin: 6.1 g/dL — CL (ref 13.0–17.0)

## 2024-04-23 MED ORDER — ALBUMIN HUMAN 25 % IV SOLN
25.0000 g | Freq: Four times a day (QID) | INTRAVENOUS | Status: AC
  Administered 2024-04-23 (×3): 25 g via INTRAVENOUS
  Filled 2024-04-23 (×3): qty 100

## 2024-04-23 MED ORDER — ORAL CARE MOUTH RINSE: 15.0000 mL | OROMUCOSAL | Status: DC | PRN

## 2024-04-23 MED ORDER — SODIUM CHLORIDE 0.9 % IV SOLN
4.0000 g | Freq: Once | INTRAVENOUS | Status: AC
  Administered 2024-04-23: 4 g via INTRAVENOUS
  Filled 2024-04-23: qty 40

## 2024-04-23 MED ORDER — ORAL CARE MOUTH RINSE
15.0000 mL | OROMUCOSAL | Status: DC
  Administered 2024-04-23 – 2024-05-05 (×144): 15 mL via OROMUCOSAL

## 2024-04-23 MED ORDER — FENTANYL CITRATE (PF) 50 MCG/ML IJ SOSY
25.0000 ug | PREFILLED_SYRINGE | Freq: Once | INTRAMUSCULAR | Status: AC
  Administered 2024-04-23: 50 ug via INTRAVENOUS

## 2024-04-23 MED ORDER — SODIUM CHLORIDE 0.9% IV SOLUTION: Freq: Once | INTRAVENOUS | Status: AC

## 2024-04-23 MED ORDER — FENTANYL 2500MCG IN NS 250ML (10MCG/ML) PREMIX INFUSION
0.0000 ug/h | INTRAVENOUS | Status: DC
  Administered 2024-04-23: 25 ug/h via INTRAVENOUS
  Administered 2024-04-24: 75 ug/h via INTRAVENOUS
  Administered 2024-04-25 – 2024-04-26 (×3): 100 ug/h via INTRAVENOUS
  Filled 2024-04-23 (×5): qty 250

## 2024-04-23 MED ORDER — INSULIN ASPART 100 UNIT/ML IJ SOLN
0.0000 [IU] | INTRAMUSCULAR | Status: DC
  Administered 2024-04-23 – 2024-04-25 (×10): 2 [IU] via SUBCUTANEOUS
  Administered 2024-04-25: 5 [IU] via SUBCUTANEOUS
  Administered 2024-04-25: 2 [IU] via SUBCUTANEOUS
  Administered 2024-04-25: 3 [IU] via SUBCUTANEOUS
  Administered 2024-04-25: 2 [IU] via SUBCUTANEOUS
  Administered 2024-04-25: 5 [IU] via SUBCUTANEOUS
  Administered 2024-04-26: 3 [IU] via SUBCUTANEOUS
  Administered 2024-04-26: 2 [IU] via SUBCUTANEOUS
  Administered 2024-04-26 – 2024-04-27 (×5): 3 [IU] via SUBCUTANEOUS
  Administered 2024-04-27 (×2): 2 [IU] via SUBCUTANEOUS
  Administered 2024-04-27: 3 [IU] via SUBCUTANEOUS
  Administered 2024-04-27: 2 [IU] via SUBCUTANEOUS
  Administered 2024-04-28 (×2): 3 [IU] via SUBCUTANEOUS
  Administered 2024-04-28 (×2): 2 [IU] via SUBCUTANEOUS
  Administered 2024-04-28 (×2): 3 [IU] via SUBCUTANEOUS
  Administered 2024-04-29: 2 [IU] via SUBCUTANEOUS
  Administered 2024-04-29 (×2): 3 [IU] via SUBCUTANEOUS
  Administered 2024-04-29: 2 [IU] via SUBCUTANEOUS
  Administered 2024-04-29: 3 [IU] via SUBCUTANEOUS
  Administered 2024-04-30 (×2): 2 [IU] via SUBCUTANEOUS
  Administered 2024-04-30 (×2): 3 [IU] via SUBCUTANEOUS
  Administered 2024-04-30: 2 [IU] via SUBCUTANEOUS
  Administered 2024-04-30 – 2024-05-02 (×14): 3 [IU] via SUBCUTANEOUS
  Administered 2024-05-03: 2 [IU] via SUBCUTANEOUS
  Administered 2024-05-03 (×2): 3 [IU] via SUBCUTANEOUS
  Administered 2024-05-03: 2 [IU] via SUBCUTANEOUS
  Administered 2024-05-03: 5 [IU] via SUBCUTANEOUS
  Administered 2024-05-03 – 2024-05-04 (×6): 3 [IU] via SUBCUTANEOUS
  Administered 2024-05-04: 5 [IU] via SUBCUTANEOUS
  Administered 2024-05-05: 3 [IU] via SUBCUTANEOUS
  Administered 2024-05-05: 2 [IU] via SUBCUTANEOUS
  Filled 2024-04-23: qty 3
  Filled 2024-04-23: qty 2
  Filled 2024-04-23: qty 5
  Filled 2024-04-23: qty 3
  Filled 2024-04-23: qty 2
  Filled 2024-04-23 (×3): qty 3
  Filled 2024-04-23: qty 2
  Filled 2024-04-23: qty 5
  Filled 2024-04-23 (×10): qty 3
  Filled 2024-04-23: qty 2
  Filled 2024-04-23 (×2): qty 3
  Filled 2024-04-23: qty 2
  Filled 2024-04-23: qty 5
  Filled 2024-04-23: qty 3
  Filled 2024-04-23: qty 5
  Filled 2024-04-23 (×2): qty 3
  Filled 2024-04-23 (×2): qty 2
  Filled 2024-04-23: qty 3
  Filled 2024-04-23 (×2): qty 2
  Filled 2024-04-23 (×2): qty 3
  Filled 2024-04-23 (×3): qty 2
  Filled 2024-04-23 (×2): qty 3
  Filled 2024-04-23 (×5): qty 2
  Filled 2024-04-23 (×3): qty 3
  Filled 2024-04-23: qty 5
  Filled 2024-04-23 (×3): qty 2
  Filled 2024-04-23: qty 3
  Filled 2024-04-23 (×3): qty 2
  Filled 2024-04-23 (×2): qty 3
  Filled 2024-04-23 (×2): qty 2
  Filled 2024-04-23: qty 3
  Filled 2024-04-23: qty 4
  Filled 2024-04-23 (×2): qty 3

## 2024-04-23 MED ORDER — FENTANYL CITRATE (PF) 50 MCG/ML IJ SOSY
25.0000 ug | PREFILLED_SYRINGE | INTRAMUSCULAR | Status: DC | PRN
  Administered 2024-04-23: 25 ug via INTRAVENOUS
  Filled 2024-04-23: qty 1

## 2024-04-23 MED ORDER — FENTANYL BOLUS VIA INFUSION
25.0000 ug | INTRAVENOUS | Status: DC | PRN
  Administered 2024-04-23 (×4): 50 ug via INTRAVENOUS

## 2024-04-23 MED ORDER — TRANEXAMIC ACID-NACL 1000-0.7 MG/100ML-% IV SOLN
1000.0000 mg | Freq: Once | INTRAVENOUS | Status: AC
  Administered 2024-04-23: 1000 mg via INTRAVENOUS
  Filled 2024-04-23: qty 100

## 2024-04-23 MED ORDER — AMIODARONE HCL 200 MG PO TABS
200.0000 mg | ORAL_TABLET | Freq: Two times a day (BID) | ORAL | Status: DC
  Administered 2024-04-23 – 2024-04-25 (×4): 200 mg
  Filled 2024-04-23 (×4): qty 1

## 2024-04-23 MED ORDER — FENTANYL CITRATE (PF) 50 MCG/ML IJ SOSY: 50.0000 ug | PREFILLED_SYRINGE | INTRAMUSCULAR | Status: DC | PRN

## 2024-04-23 MED ORDER — AMIODARONE HCL 200 MG PO TABS: 200.0000 mg | ORAL_TABLET | Freq: Two times a day (BID) | ORAL | Status: DC

## 2024-04-23 MED ORDER — AMIODARONE IV BOLUS ONLY 150 MG/100ML
150.0000 mg | Freq: Once | INTRAVENOUS | Status: AC
  Administered 2024-04-24: 150 mg via INTRAVENOUS
  Filled 2024-04-23: qty 100

## 2024-04-23 NOTE — Progress Notes (Signed)
 "  Trauma/Critical Care Follow Up Note  Subjective:    Overnight Issues: noted overnight issues.  Continued significant pressor support in ICU  Objective:  Vital signs for last 24 hours: Temp:  [97.9 F (36.6 C)-100.2 F (37.9 C)] 97.9 F (36.6 C) (12/31 0800) Pulse Rate:  [53-157] 55 (12/31 0800) Resp:  [0-26] 19 (12/31 0800) BP: (91-140)/(51-93) 111/68 (12/31 0800) SpO2:  [89 %-100 %] 96 % (12/31 0800) Arterial Line BP: (52-156)/(46-97) 132/54 (12/31 0800) FiO2 (%):  [40 %] 40 % (12/31 0812)  Intake/Output from previous day: 12/30 0701 - 12/31 0700 In: 7281.9 [I.V.:5273.3; Blood:1093.3; IV Piggyback:915.2] Out: 570 [Urine:570]  Intake/Output this shift: Total I/O In: 239.8 [I.V.:239.8] Out: 50 [Urine:50]  Vent settings for last 24 hours: Vent Mode: CPAP;PSV FiO2 (%):  [40 %] 40 % Set Rate:  [16 bmp] 16 bmp Vt Set:  [510 mL] 510 mL PEEP:  [5 cmH20] 5 cmH20 Pressure Support:  [8 cmH20-10 cmH20] 8 cmH20 Plateau Pressure:  [12 cmH20] 12 cmH20  Physical Exam:  Gen: Appears comfortable, minimally responsive, currently intubated Neuro: Intermittently following commands, HEENT: PERRL Neck: supple CV: RRR Pulm: intubated, Abd: soft, NT  , no recent BM GU: urine clear and yellow, +spontaneous voids Extr: wwp, no edema  Right thigh with significant fullness around femur repair, scrotum full of hematoma  Results for orders placed or performed during the hospital encounter of 04/17/24 (from the past 24 hours)  Glucose, capillary     Status: Abnormal   Collection Time: 04/22/24 12:18 PM  Result Value Ref Range   Glucose-Capillary 179 (H) 70 - 99 mg/dL  Culture, Respiratory w Gram Stain     Status: None (Preliminary result)   Collection Time: 04/22/24  1:08 PM   Specimen: Tracheal Aspirate; Respiratory  Result Value Ref Range   Specimen Description TRACHEAL ASPIRATE    Special Requests NONE    Gram Stain      FEW WBC PRESENT,BOTH PMN AND MONONUCLEAR MODERATE GRAM  NEGATIVE RODS FEW GRAM POSITIVE COCCI RARE GRAM POSITIVE RODS Performed at [facility], [address], [place], [place] [address]    Culture PENDING    Report Status PENDING   Hemoglobin and hematocrit, blood     Status: Abnormal   Collection Time: 04/22/24  1:08 PM  Result Value Ref Range   Hemoglobin 7.6 (L) 13.0 - 17.0 g/dL   HCT 77.4 (L) 60.9 - 47.9 %  Glucose, capillary     Status: Abnormal   Collection Time: 04/22/24  4:10 PM  Result Value Ref Range   Glucose-Capillary 191 (H) 70 - 99 mg/dL  Hemoglobin and hematocrit, blood     Status: Abnormal   Collection Time: 04/22/24  6:24 PM  Result Value Ref Range   Hemoglobin 7.3 (L) 13.0 - 17.0 g/dL   HCT 78.6 (L) 60.9 - 47.9 %  Glucose, capillary     Status: Abnormal   Collection Time: 04/22/24  7:28 PM  Result Value Ref Range   Glucose-Capillary 213 (H) 70 - 99 mg/dL  Glucose, capillary     Status: Abnormal   Collection Time: 04/22/24 11:34 PM  Result Value Ref Range   Glucose-Capillary 196 (H) 70 - 99 mg/dL  I-STAT 7, (LYTES, BLD GAS, ICA, H+H)     Status: Abnormal   Collection Time: 04/23/24 12:18 AM  Result Value Ref Range   pH, Arterial 7.370 7.35 - 7.45   pCO2 arterial 28.4 (L) 32 - 48 mmHg   pO2, Arterial  91 83 - 108 mmHg   Bicarbonate 16.4 (L) 20.0 - 28.0 mmol/L   TCO2 17 (L) 22 - 32 mmol/L   O2 Saturation 97 %   Acid-base deficit 8.0 (H) 0.0 - 2.0 mmol/L   Sodium 144 135 - 145 mmol/L   Potassium 3.7 3.5 - 5.1 mmol/L   Calcium , Ion 1.22 1.15 - 1.40 mmol/L   HCT 17.0 (L) 39.0 - 52.0 %   Hemoglobin 5.8 (LL) 13.0 - 17.0 g/dL   Patient temperature 01.1 F    Sample type ARTERIAL   Hemoglobin and hematocrit, blood     Status: Abnormal   Collection Time: 04/23/24 12:22 AM  Result Value Ref Range   Hemoglobin 6.1 (LL) 13.0 - 17.0 g/dL   HCT 81.8 (L) 60.9 - 47.9 %  Prepare RBC (crossmatch)     Status: None   Collection Time: 04/23/24  1:03 AM  Result Value Ref Range   Order Confirmation      ORDER  PROCESSED BY BLOOD BANK Performed at [facility], [address] [place], [place] [address]   Glucose, capillary     Status: Abnormal   Collection Time: 04/23/24  3:43 AM  Result Value Ref Range   Glucose-Capillary 202 (H) 70 - 99 mg/dL  Phosphorus     Status: None   Collection Time: 04/23/24  5:22 AM  Result Value Ref Range   Phosphorus 3.9 2.5 - 4.6 mg/dL  Magnesium      Status: None   Collection Time: 04/23/24  5:22 AM  Result Value Ref Range   Magnesium  2.0 1.7 - 2.4 mg/dL  Basic metabolic panel with GFR     Status: Abnormal   Collection Time: 04/23/24  5:22 AM  Result Value Ref Range   Sodium 142 135 - 145 mmol/L   Potassium 4.0 3.5 - 5.1 mmol/L   Chloride 116 (H) 98 - 111 mmol/L   CO2 17 (L) 22 - 32 mmol/L   Glucose, Bld 194 (H) 70 - 99 mg/dL   BUN 42 (H) 8 - 23 mg/dL   Creatinine, Ser 8.51 (H) 0.61 - 1.24 mg/dL   Calcium  7.3 (L) 8.9 - 10.3 mg/dL   GFR, Estimated 44 (L) >60 mL/min   Anion gap 9 5 - 15  CBC     Status: Abnormal   Collection Time: 04/23/24  5:22 AM  Result Value Ref Range   WBC 13.4 (H) 4.0 - 10.5 K/uL   RBC 2.42 (L) 4.22 - 5.81 MIL/uL   Hemoglobin 7.4 (L) 13.0 - 17.0 g/dL   HCT 78.2 (L) 60.9 - 47.9 %   MCV 89.7 80.0 - 100.0 fL   MCH 30.6 26.0 - 34.0 pg   MCHC 34.1 30.0 - 36.0 g/dL   RDW 84.0 (H) 88.4 - 84.4 %   Platelets 75 (L) 150 - 400 K/uL   nRBC 15.2 (H) 0.0 - 0.2 %  Glucose, capillary     Status: Abnormal   Collection Time: 04/23/24  7:34 AM  Result Value Ref Range   Glucose-Capillary 153 (H) 70 - 99 mg/dL    Assessment & Plan:  LOS: 6 days   Additional comments:I reviewed the patient's new clinical lab test results.   and I reviewed the patients new imaging test results.    89 y.o. male s/p fall   Known Injuries: - Right femur fracture - SDH/SAH - Nondepressed parietoocipital bone fracture - Trace left basilar pneumothorax - Bilateral rib fractures - Thoracic TP fractures - Possible  T9-10 discoligamentous injury - Left  psoas hemorrhage without extravasation   Plan  - Appreciate neurosurgery recommendations, Dr. Garst              - MRI thoracic spine when able              - Repeat [facility] 12/28 with stable ICH, but new occipital stroke, Dr. Merrianne consulted.  - q1h neuro check - CTA Neck pending to evaluate for embolic source - Appreciate orthopaedics recommendations, Dr. Jerri, Dr. Kendal - ORIF right femur 12/29 (Dr. Jerri)  - Acute blood loss anemia - monitor and transfuse as needed - Shock - likely hemorrhagic, cardiogenic (CHF) may be contributing at age 25, intravascular volume status difficult to assess, CCM managing have been checking CVP and echo overnight               - FEN - strict NPO, cortrak, SLP eval - DVT - SCDs, holding chemical ppx due to bleeding concerns - Dr. Annella (CCM) consulted for assistance in critical care mgmt as well - greatly appreciate assistance in his care  - Dispo - ICU, clinical update provided to family at bedside this AM. Family would like to continue with full support.  I discussed possibility of DNR to avoid CPR in this fragile 89 year old but family would like to continue full code.    Deward JINNY Foy, MD Trauma & General Surgery Please use [email or web address] to contact on call provider  04/23/2024  *Care during the described time interval was provided by me. I have reviewed this patient's available data, including medical history, events of note, physical examination and test results as part of my evaluation.    "

## 2024-04-23 NOTE — Progress Notes (Signed)
 SLP Cancellation Note  Patient Details Name: Tom Norton MRN: [id number] DOB: [birth year]   Cancelled treatment:       Reason Eval/Treat Not Completed: Patient not medically ready. Remains intubated will f/u   Azra Abrell, Consuelo Fitch 04/23/2024, 9:00 AM

## 2024-04-23 NOTE — Progress Notes (Signed)
 OT Cancellation Note  Patient Details Name: Tom Norton MRN: [id number] DOB: 1929/08/04   Cancelled Treatment:    Reason Eval/Treat Not Completed: Patient not medically ready. Pt cardioverted overnight, continues to be on 3 pressors. Acute OT to follow up as pt medically ready.   [name] FREDERICK, OTR/L [facility] Acute Rehabilitation Office: [phone]   [name] 04/23/2024, 8:27 AM

## 2024-04-23 NOTE — Progress Notes (Signed)
 eLink Physician-Brief Progress Note Patient Name: Tom Norton DOB: 05-21-1929 MRN: [id number]   Date of Service  04/23/2024  HPI/Events of Note  Patient back in A-fib with RVR of the receiving a bath.  Currently on oral amiodarone .  eICU Interventions  Will give a 150 mg IV amiodarone  bolus to try to control rate.  Discussed with RN.     Intervention Category Intermediate Interventions: Arrhythmia - evaluation and management  Jerilynn Berg 04/23/2024, 11:50 PM

## 2024-04-23 NOTE — Progress Notes (Signed)
 Cross covering ICU physician  Called emergently to eval pt with h/o afib on chronic eliquis  (currently held 2/2 traumatic presentation) who entered into afib with rvr and hypotension. Pt was post operatively on fluctuating vasopressors however during this episode levo escalated to 40mcg, vaso 0.03, neo 340mcg. Discussed with wife cardioversion procedure she consents and is familiar as pt has had this performed emergently years ago.   Pt was on minimal sedation with 0.3dex. this was increased to 0.5 and given 2 mg versed  with adequate sedation pt was successfully cardioverted after sync at 120j. Pt is in sinus at this time and map in 90's.   D/w bedside RN to titrate levo down prior to other vasopressors. Once down to 20mcg will titrate neo to approximately 200mcg and then further titrate levo prior to further decrease with neo. Maintain vaso.   All questions answered to best of my ability with pt's wife at bedside as well.

## 2024-04-23 NOTE — Progress Notes (Signed)
 "  Providing Compassionate, Quality Care - Together   Subjective: Patient intubated, minimally responsive.  Objective: Vital signs in last 24 hours: Temp:  [97.9 F (36.6 C)-100.2 F (37.9 C)] 97.9 F (36.6 C) (12/31 0800) Pulse Rate:  [53-157] 55 (12/31 0800) Resp:  [0-26] 19 (12/31 0800) BP: (91-140)/(51-93) 111/68 (12/31 0800) SpO2:  [89 %-100 %] 96 % (12/31 0800) Arterial Line BP: (52-156)/(46-97) 132/54 (12/31 0800) FiO2 (%):  [40 %] 40 % (12/31 0812)  Intake/Output from previous day: 12/30 0701 - 12/31 0700 In: 7281.9 [I.V.:5273.3; Blood:1093.3; IV Piggyback:915.2] Out: 570 [Urine:570] Intake/Output this shift: Total I/O In: 239.8 [I.V.:239.8] Out: 50 [Urine:50]  Alert Intubated PERRLA 2 mm BL, sluggish Follows commands Purposeful movement BUE, strength symmetric   Lab Results: Recent Labs    04/22/24 0306 04/22/24 1308 04/23/24 0022 04/23/24 0522  WBC 10.5  --   --  13.4*  HGB 7.3*   < > 6.1* 7.4*  HCT 21.4*   < > 18.1* 21.7*  PLT 84*  --   --  75*   < > = values in this interval not displayed.   BMET Recent Labs    04/22/24 0306 04/23/24 0018 04/23/24 0522  NA 144 144 142  K 4.0 3.7 4.0  CL 115*  --  116*  CO2 19*  --  17*  GLUCOSE 187*  --  194*  BUN 34*  --  42*  CREATININE 1.27*  --  1.48*  CALCIUM  7.3*  --  7.3*    Studies/Results: VAS US  LOWER EXTREMITY VENOUS (DVT) Result Date: 04/23/2024  Lower Venous DVT Study Patient Name:  Tom Norton  Date of Exam:   04/22/2024 Medical Rec #: [id number]         Accession #:    [id number] Date of Birth: [birth year]          Patient Gender: M Patient Age:   89 years Exam Location:  [facility] Procedure:      VAS US  LOWER EXTREMITY VENOUS (DVT) Referring Phys: [name] GRAVE --------------------------------------------------------------------------------  Indications: Swelling. Other Indications: Critical polytrauma - s/p surgical repair of right femur                    fracture  (04/21/2024). Limitations: Poor ultrasound/tissue interface and body habitus. Comparison Study: No previous exams Performing Technologist: Jody Hill RVT, RDMS  Examination Guidelines: A complete evaluation includes B-mode imaging, spectral Doppler, color Doppler, and power Doppler as needed of all accessible portions of each vessel. Bilateral testing is considered an integral part of a complete examination. Limited examinations for reoccurring indications may be performed as noted. The reflux portion of the exam is performed with the patient in reverse Trendelenburg.  +--------+---------------+---------+-----------+----------+--------------------+ RIGHT   CompressibilityPhasicitySpontaneityPropertiesThrombus Aging       +--------+---------------+---------+-----------+----------+--------------------+ CFV                    Yes      Yes                  patent by                                                                 color/doppler        +--------+---------------+---------+-----------+----------+--------------------+ SFJ  unable to visualize  +--------+---------------+---------+-----------+----------+--------------------+ FV Prox Full           Yes      Yes                                       +--------+---------------+---------+-----------+----------+--------------------+ FV Mid                 Yes      Yes                  patent by                                                                 color/doppler        +--------+---------------+---------+-----------+----------+--------------------+ FV                     Yes      Yes                  patent by            Distal                                               color/doppler        +--------+---------------+---------+-----------+----------+--------------------+ PFV     Full                                                               +--------+---------------+---------+-----------+----------+--------------------+ POP     Full           Yes      No                                        +--------+---------------+---------+-----------+----------+--------------------+ PTV     Full                                         Not well visualized  +--------+---------------+---------+-----------+----------+--------------------+ PERO    Full                                         Not well visualized  +--------+---------------+---------+-----------+----------+--------------------+ VERY difficult and limited exam due to extremely edematous extremity. A large complex heterogenous collection is seen in groin/proximal thigh. Possible extrinsic compression of vessels due to amount of edema in lower extremity.  +----+---------------+---------+-----------+----------+--------------+ LEFTCompressibilityPhasicitySpontaneityPropertiesThrombus Aging +----+---------------+---------+-----------+----------+--------------+ CFV Full           Yes      Yes                                 +----+---------------+---------+-----------+----------+--------------+  Summary: RIGHT:  - No obvious evidence of DVT seen in right lower extremity in areas visualized, however exam was severely limited due to above stated limitations. There is a large complex heterogenous collection in area of groin/proximal thigh. Veins appear diminished in size, cannot rule out extrinsic compression. Recommend further imaging if clinically indicated. -No evidence of cystic structure seen in popliteal fossa. LEFT: - No evidence of common femoral vein obstruction.   *See table(s) above for measurements and observations. Electronically signed by Penne Colorado MD on 04/23/2024 at 9:30:17 AM.    Final    DG FEMUR PORT, MIN 2 VIEWS RIGHT Result Date: 04/22/2024 CLINICAL DATA:  Open reduction and internal fixation of a displaced right femoral shaft fracture. EXAM: DG  FEMUR 2+V PORT*R* COMPARISON:  04/17/2024 and C-arm images obtained earlier today. FINDINGS: Intramedullary and screw fixation of the previously demonstrated proximal right femoral shaft fracture. This is comminuted with an intertrochanteric component currently demonstrated. Right knee degenerative changes. IMPRESSION: Open reduction and internal fixation of the previously demonstrated proximal right femoral shaft fracture. This is comminuted with an interval intertrochanteric component visualized. These results will be called to the ordering clinician or representative by the Radiologist Assistant, and communication documented in the PACS or [employer]. Electronically Signed   By: Elspeth Bathe M.D.   On: 04/22/2024 15:39   ECHOCARDIOGRAM COMPLETE Result Date: 04/22/2024    ECHOCARDIOGRAM REPORT   Patient Name:   Tom Norton Date of Exam: 04/22/2024 Medical Rec #:  [id number]        Height:       66.0 in Accession #:    [id number]       Weight:       197.0 lb Date of Birth:  [birth year]         BSA:          1.987 m Patient Age:    89 years         BP:           111/72 mmHg Patient Gender: M                HR:           95 bpm. Exam Location:  Inpatient Procedure: 2D Echo, Cardiac Doppler and Color Doppler (Both Spectral and Color            Flow Doppler were utilized during procedure). STAT ECHO Indications:    Shock  History:        Patient has prior history of Echocardiogram examinations, most                 recent 09/23/2013. Arrythmias:Atrial Fibrillation; Risk                 Factors:Hypertension and Dyslipidemia.  Sonographer:    Juliene Rucks Referring Phys: [phone] MATTHEW R HUNSUCKER  Sonographer Comments: Technically challenging study due to limited acoustic windows, Technically difficult study due to poor echo windows, suboptimal parasternal window, suboptimal apical window, suboptimal subcostal window, patient is obese and echo performed with patient supine and on artificial respirator. Image  acquisition challenging due to respiratory motion. IMPRESSIONS  1. Unable to assess wall motion based on image quality. LV function appears preserved based on long axis view but severely foreshortened. . Left ventricular endocardial border not optimally defined to evaluate regional wall motion.  2. Right ventricular systolic function was not well visualized. The right ventricular size is not well visualized.  3. Moderate pleural effusion.  4. The mitral valve is grossly normal. No evidence of mitral valve regurgitation.  5. The aortic valve is tricuspid. There is moderate thickening of the aortic valve. Aortic valve regurgitation is not visualized. Mild aortic valve stenosis. Conclusion(s)/Recommendation(s): Poor windows for evaluation of left ventricular function by transthoracic echocardiography. Would recommend an alternative means of evaluation. FINDINGS  Left Ventricle: Unable to assess wall motion based on image quality. LV function appears preserved based on long axis view but severely foreshortened. Left ventricular endocardial border not optimally defined to evaluate regional wall motion. Suboptimal  image quality limits for assessment of left ventricular hypertrophy. Left ventricular diastolic function could not be evaluated due to nondiagnostic images. Right Ventricle: The right ventricular size is not well visualized. Right vetricular wall thickness was not well visualized. Right ventricular systolic function was not well visualized. Left Atrium: Left atrial size was normal in size. Right Atrium: Right atrial size was not well visualized. Pericardium: There is no evidence of pericardial effusion. Mitral Valve: The mitral valve is grossly normal. No evidence of mitral valve regurgitation. Tricuspid Valve: The tricuspid valve is not well visualized. Aortic Valve: The aortic valve is tricuspid. There is moderate thickening of the aortic valve. Aortic valve regurgitation is not visualized. Mild aortic  stenosis is present. Aortic valve peak gradient measures 17.3 mmHg. Pulmonic Valve: The pulmonic valve was not well visualized. Aorta: The aortic root is normal in size and structure. Venous: The inferior vena cava was not well visualized. IAS/Shunts: The interatrial septum was not well visualized. Additional Comments: There is a moderate pleural effusion.  LEFT VENTRICLE PLAX 2D LVIDd:         4.30 cm   Diastology LVIDs:         3.00 cm   LV e' medial:  4.68 cm/s LV PW:         1.40 cm   LV e' lateral: 9.17 cm/s LV IVS:        1.10 cm LVOT diam:     2.20 cm LV SV:         79 LV SV Index:   40 LVOT Area:     3.80 cm  AORTIC VALVE AV Area (Vmax): 2.67 cm AV Vmax:        208.00 cm/s AV Peak Grad:   17.3 mmHg LVOT Vmax:      146.00 cm/s LVOT Vmean:     91.000 cm/s LVOT VTI:       0.207 m  AORTA Ao Root diam: 3.20 cm  SHUNTS Systemic VTI:  0.21 m Systemic Diam: 2.20 cm Morene Brownie Electronically signed by Morene Brownie Signature Date/Time: 04/22/2024/10:22:32 AM    Final    DG Chest Port 1 View Result Date: 04/21/2024 EXAM: 1 VIEW(S) XRAY OF THE CHEST 04/21/2024 06:07:35 PM COMPARISON: 04/20/2024 CLINICAL HISTORY: Pneumothorax FINDINGS: LINES, TUBES AND DEVICES: Endotracheal tube in place with tip 3.8 cm superior to the carina. Enteric tube in place, courses below the diaphragm with distal tip beyond the inferior margin of the film. Right upper extremity central venous catheter in place, terminates at the level of the SVC. LUNGS AND PLEURA: Small bilateral pleural effusions, slightly enlarged on the left. Increasing left basilar opacification reflecting atelectasis or superimposed infiltrate at the left lung base. No pneumothorax. HEART AND MEDIASTINUM: Cardiomegaly. Aortic atherosclerosis. BONES AND SOFT TISSUES: Minimally displaced right fourth and fifth rib fractures laterally again noted. IMPRESSION: 1. No pneumothorax. 2. Small bilateral pleural effusions, slightly enlarged on the left. 3. Increasing  left  basilar opacification, reflecting atelectasis or superimposed infiltrate. 4. Minimally displaced right fourth and fifth rib fractures laterally. 5. Cardiomegaly. 6. Aortic atherosclerosis. Electronically signed by: Dorethia Molt MD 04/21/2024 08:38 PM EST RP Workstation: [id number]   DG FEMUR, MIN 2 VIEWS RIGHT Result Date: 04/21/2024 CLINICAL DATA:  Elective surgery. EXAM: RIGHT FEMUR 2 VIEWS COMPARISON:  04/17/2024 FINDINGS: Nine fluoroscopic spot views of the femur submitted from the operating room. Femoral intramedullary nail with trans trochanteric and distal locking screw fixation traversing proximal femur fracture. Fluoroscopy time 204.3 seconds. Dose 64.89 mGy. IMPRESSION: Intraoperative fluoroscopy during ORIF of proximal femur fracture. Electronically Signed   By: Andrea Gasman M.D.   On: 04/21/2024 14:20   DG C-Arm 1-60 Min-No Report Result Date: 04/21/2024 Fluoroscopy was utilized by the requesting physician.  No radiographic interpretation.   DG C-Arm 1-60 Min-No Report Result Date: 04/21/2024 Fluoroscopy was utilized by the requesting physician.  No radiographic interpretation.    Assessment/Plan: Patient fell approximately 10' from roof, sustaining left-side nondisplaced skull fracture, scattered intracranial hemorrhages, and T9-10 discoligamentous injury. Eliquis  was reversed. No surgical intervention recommended for ICH. Follow up head CT on 12/28 stable.   LOS: 6 days   -MRI T-spine when medically stable  I am in communication with my attending and they agree with the plan for this patient.   Gerard Beck, DNP, AGNP-C Nurse Practitioner  [facility] [address], [address], [place], [place] [address] P: [phone]    F: [phone]  04/23/2024, 11:45 AM     "

## 2024-04-23 NOTE — Plan of Care (Signed)
  Problem: Clinical Measurements: Goal: Ability to maintain clinical measurements within normal limits will improve Outcome: Progressing Goal: Will remain free from infection Outcome: Progressing Goal: Diagnostic test results will improve Outcome: Progressing   Problem: Activity: Goal: Risk for activity intolerance will decrease Outcome: Progressing   Problem: Nutrition: Goal: Adequate nutrition will be maintained Outcome: Progressing   Problem: Pain Managment: Goal: General experience of comfort will improve and/or be controlled Outcome: Progressing

## 2024-04-23 NOTE — Progress Notes (Addendum)
 Subjective: 2 Days Post-Op Procedures (LRB): FIXATION, FRACTURE, INTERTROCHANTERIC, WITH INTRAMEDULLARY ROD (Right) Patient still intubated but more alert.  Went into [place] with rvr overnight and was cardioverted.  Had RLE venous u/s yesterday, but was limited due to significant edema.  Objective: Vital signs in last 24 hours: Temp:  [98 F (36.7 C)-100.2 F (37.9 C)] 98.1 F (36.7 C) (12/31 0357) Pulse Rate:  [53-157] 55 (12/31 0800) Resp:  [0-26] 19 (12/31 0800) BP: (91-140)/(51-93) 111/68 (12/31 0800) SpO2:  [89 %-100 %] 96 % (12/31 0800) Arterial Line BP: (52-156)/(46-97) 132/54 (12/31 0800) FiO2 (%):  [40 %] 40 % (12/31 0326)  Intake/Output from previous day: 12/30 0701 - 12/31 0700 In: 7281.9 [I.V.:5273.3; Blood:1093.3; IV Piggyback:915.2] Out: 570 [Urine:570] Intake/Output this shift: No intake/output data recorded.  Recent Labs    04/22/24 1308 04/22/24 1824 04/23/24 0018 04/23/24 0022 04/23/24 0522  HGB 7.6* 7.3* 5.8* 6.1* 7.4*   Recent Labs    04/22/24 0306 04/22/24 1308 04/23/24 0022 04/23/24 0522  WBC 10.5  --   --  13.4*  RBC 2.40*  --   --  2.42*  HCT 21.4*   < > 18.1* 21.7*  PLT 84*  --   --  75*   < > = values in this interval not displayed.   Recent Labs    04/22/24 0306 04/23/24 0018 04/23/24 0522  NA 144 144 142  K 4.0 3.7 4.0  CL 115*  --  116*  CO2 19*  --  17*  BUN 34*  --  42*  CREATININE 1.27*  --  1.48*  GLUCOSE 187*  --  194*  CALCIUM  7.3*  --  7.3*   No results for input(s): LABPT, INR in the last 72 hours.  Intact pulses distally Incision: moderate drainage to proximal and most distal bandages. Thigh and calf very edematous, but still somewhat soft Medial thigh with moderate ecchymosis    Assessment/Plan: 2 Days Post-Op Procedures (LRB): FIXATION, FRACTURE, INTERTROCHANTERIC, WITH INTRAMEDULLARY ROD (Right) NWB RLE Dry dressing changes prn No signs of compartment syndrome at this time Continue to transfuse blood  products as needed per primary team        [name] LITTIE Grave 04/23/2024, 8:17 AM

## 2024-04-23 NOTE — Procedures (Signed)
 Arterial Line Insertion Start/End12/31/2025 2:40 AM, 04/23/2024 2:47 AM  Patient location: ICU. Preanesthetic checklist: patient identified and site marked Left, radial was placed Catheter size: 20 G Hand hygiene performed  and maximum sterile barriers used  Allen's test indicative of satisfactory collateral circulation Following insertion, Biopatch and dressing applied. Post procedure assessment: normal  Patient tolerated the procedure well with no immediate complications.

## 2024-04-23 NOTE — Progress Notes (Signed)
 "  NAME:  Tom Norton, MRN:  [id number], DOB:  10/12/1929, LOS: 6 ADMISSION DATE:  04/17/2024, CONSULTATION DATE:  04/23/2024 REFERRING MD:  Trauma, CHIEF COMPLAINT:  shock   History of Present Illness:  89 year old man admitted as polytrauma after fall from deck with multiple orthopedic injuries, fracture, whom we are consulted for hypoxemia and shock.  Patient was admitted Christmas Day after fall from deck per report.  Initial images reveal left-sided rib fractures, right femur fracture, possible iliac fractures, left posterior subdural hematoma no midline shift, small left posterior parafalcine subdural hematoma, traumatic subarachnoid hemorrhage, skull fracture, trace left apical pneumothorax.  Chest x-ray 12/28 demonstrated left-sided atelectasis and small pleural effusion on my review interpretation.  He went for femur repair 12/29.  He named and remains intubated after procedure.  Chest x-ray 12/29 reveals ET tube placement, similar right-sided infiltrates and atelectasis, new rounded left-sided infiltrate on my review interpretation.  He developed shock now on maximum dose norepinephrine , vasopressin , started on phenylephrine  overnight.  He remains intubated.  Minimal vent settings.  He is on Precedex  for sedation.  Weaning Precedex .  Blood pressure get a bit better.  Review of labs reveal serial low hemoglobins.  He is getting a transfusion today.  He has had transfusions earlier this admission.  Pertinent  Medical History    Significant Hospital Events: Including procedures, antibiotic start and stop dates in addition to other pertinent events   12/25 admitted after fall 12/29 OR fro femur fracture repair  12/30 PCCM consult, AFIB RVR overnight  Interim History / Subjective:  Afib RVR overnight cardioverted, still on pressors, AKI little worse, oliguric, in pain, Hgb dropping still.  Objective    Blood pressure 111/68, pulse (!) 55, temperature 97.9 F (36.6 C), temperature  source Axillary, resp. rate 19, height 5' 6 (1.676 m), weight 89.4 kg, SpO2 96%. CVP:  [3 mmHg-15 mmHg] 5 mmHg  Vent Mode: CPAP;PSV FiO2 (%):  [40 %] 40 % Set Rate:  [16 bmp] 16 bmp Vt Set:  [510 mL] 510 mL PEEP:  [5 cmH20] 5 cmH20 Pressure Support:  [8 cmH20-10 cmH20] 8 cmH20 Plateau Pressure:  [12 cmH20] 12 cmH20   Intake/Output Summary (Last 24 hours) at 04/23/2024 0855 Last data filed at 04/23/2024 0800 Gross per 24 hour  Intake 7088.21 ml  Output 585 ml  Net 6503.21 ml   Filed Weights   04/17/24 1700  Weight: 89.4 kg    Examination: General: elderly, frail appearing Lungs: coarse ventilated sounds Cardiovascular: tachy, RR Abdomen: ND Extremities: UE edema, RLE pitting edema mid shin, L leg minimal edema Neuro: Sedated, does not follow commands   Resolved problem list   Assessment and Plan   Shock: Presumed distributive or hypovolemic.  Requiring blood transfusions, recent anemia.  Postoperative blood loss.  Critical illness for bone marrow response etc.  Also with what appears to be new infiltrate left lower lobe on chest x-ray 12/29.  Septic shock possible with pulmonary source. -- MAP goal greater than 65, currently on vasopressin , norepinephrine , phenylephrine , weaning phenylephrine , first pressor to wean off -- Tracheal aspirate culture with + gram stain, started cefepime  12/30 -- Continue stress dose steroids -- Stopped antihypertensives on [facility]  12/30 -- CVP is not elevated -- TTE poor windows but nothing catastrophic  Acute hypoxemic respiratory failure: Preceding intubation, remains intubated after procedure 12/29.  Chest x-ray consistent with atelectasis.  Suspect some element of pleural effusion, volume overload, third spacing. -- PRVC, VAP bundle, stress ulcer prophylaxis  AKI on  CKD: Creatinine has wax and waned, trend is worsening, oliguric -- MIVF stopped 12/30 with anasarca -- CVP 5 12/31, likely third spacing and blood loss contributing to mild  hypovolemia, albumin  challenge 12/31 -- Not a dialysis candidate given advanced age, will need to consider lasix  in coming days if assured adequately resuscitated   AFIB w/ RVR: New 12/30-1231 AM. No bradycardic. --stop amio gtt with bradycardia, start amio 200 mg BID --No AC with concern for ongoing post surgical bleed  Rest per primary service.  PCCM will continue to follow.  Labs   CBC: Recent Labs  Lab 04/19/24 0527 04/20/24 0551 04/20/24 0814 04/21/24 0521 04/21/24 1337 04/22/24 0306 04/22/24 1308 04/22/24 1824 04/23/24 0018 04/23/24 0022 04/23/24 0522  WBC 9.9 8.4  --  6.7  --  10.5  --   --   --   --  13.4*  HGB 8.2* 7.2*   < > 8.3*   < > 7.3* 7.6* 7.3* 5.8* 6.1* 7.4*  HCT 23.2* 21.2*   < > 24.7*   < > 21.4* 22.5* 21.3* 17.0* 18.1* 21.7*  MCV 90.6 95.1  --  94.6  --  89.2  --   --   --   --  89.7  PLT 100* 96*  --  101*  --  84*  --   --   --   --  75*   < > = values in this interval not displayed.    Basic Metabolic Panel: Recent Labs  Lab 04/19/24 0527 04/20/24 0551 04/20/24 0814 04/21/24 0521 04/21/24 1337 04/21/24 1546 04/22/24 0306 04/23/24 0018 04/23/24 0522  NA 142 145   < > 147* 147* 146* 144 144 142  K 3.8 3.9   < > 3.7 3.5 3.7 4.0 3.7 4.0  CL 114* 118*  --  119*  --   --  115*  --  116*  CO2 20* 20*  --  20*  --   --  19*  --  17*  GLUCOSE 133* 133*  --  112*  --   --  187*  --  194*  BUN 26* 31*  --  28*  --   --  34*  --  42*  CREATININE 1.04 0.99  --  0.98  --   --  1.27*  --  1.48*  CALCIUM  8.6* 8.2*  --  8.1*  --   --  7.3*  --  7.3*  MG 2.5* 2.4  --  2.3  --   --  1.9  --  2.0  PHOS 2.5 2.1*  --  1.9*  --   --  3.6  --  3.9   < > = values in this interval not displayed.   GFR: Estimated Creatinine Clearance: 33.3 mL/min (A) (by C-G formula based on SCr of 1.48 mg/dL (H)). Recent Labs  Lab 04/17/24 1657 04/17/24 2347 04/20/24 0551 04/21/24 0521 04/22/24 0306 04/23/24 0522  WBC  --    < > 8.4 6.7 10.5 13.4*  LATICACIDVEN 2.9*  --    --   --   --   --    < > = values in this interval not displayed.    Liver Function Tests: Recent Labs  Lab 04/17/24 1648  AST 51*  ALT 28  ALKPHOS 77  BILITOT 0.5  PROT 5.8*  ALBUMIN  3.7   No results for input(s): LIPASE, AMYLASE in the last 168 hours. No results for input(s): AMMONIA in the last 168 hours.  ABG    Component Value Date/Time   PHART 7.370 04/23/2024 0018   PCO2ART 28.4 (L) 04/23/2024 0018   PO2ART 91 04/23/2024 0018   HCO3 16.4 (L) 04/23/2024 0018   TCO2 17 (L) 04/23/2024 0018   ACIDBASEDEF 8.0 (H) 04/23/2024 0018   O2SAT 97 04/23/2024 0018     Coagulation Profile: Recent Labs  Lab 04/17/24 1648  INR 1.3*    Cardiac Enzymes: No results for input(s): CKTOTAL, CKMB, CKMBINDEX, TROPONINI in the last 168 hours.  HbA1C: Hgb A1c MFr Bld  Date/Time Value Ref Range Status  04/18/2024 02:13 PM 5.4 4.8 - 5.6 % Final    Comment:    (NOTE) Diagnosis of Diabetes The following HbA1c ranges recommended by the American Diabetes Association (ADA) may be used as an aid in the diagnosis of diabetes mellitus.  Hemoglobin             Suggested A1C NGSP%              Diagnosis  <5.7                   Non Diabetic  5.7-6.4                Pre-Diabetic  >6.4                   Diabetic  <7.0                   Glycemic control for                       adults with diabetes.    09/23/2013 03:00 PM 5.5 <5.7 % Final    Comment:    (NOTE)                                                                       According to the ADA Clinical Practice Recommendations for 2011, when HbA1c is used as a screening test:  >=6.5%   Diagnostic of Diabetes Mellitus           (if abnormal result is confirmed) 5.7-6.4%   Increased risk of developing Diabetes Mellitus References:Diagnosis and Classification of Diabetes Mellitus,Diabetes Care,2011,34(Suppl 1):S62-S69 and Standards of Medical Care in         Diabetes - 2011,Diabetes Care,2011,34 (Suppl  1):S11-S61.    CBG: Recent Labs  Lab 04/22/24 1610 04/22/24 1928 04/22/24 2334 04/23/24 0343 04/23/24 0734  GLUCAP 191* 213* 196* 202* 153*    Review of Systems:   Unable to obtain due to patient factors  Past Medical History:  He,  has a past medical history of BPH (benign prostatic hyperplasia), Cardiomegaly, Dilated aortic root, Dysrhythmia, Enlarged prostate without lower urinary tract symptoms (luts), GERD (gastroesophageal reflux disease), History of hiatal hernia, Hyperlipidemia, Hypertension, Hypothyroidism, Insomnia, Junctional bradycardia, OA (osteoarthritis), Obesity, Paroxysmal A-fib (HCC), Peripheral neuropathy (07/17/2017), and Vitamin D  deficiency.   Surgical History:   Past Surgical History:  Procedure Laterality Date   CARDIOVERSION  2015   CARPAL TUNNEL RELEASE Left 2019   I & D EXTREMITY Left 02/18/2021   Procedure: IRRIGATION AND DEBRIDEMENT KNEE;  Surgeon: Harden Jerona GAILS, MD;  Location: [facility] OR;  Service: Orthopedics;  Laterality:  Left;   INGUINAL HERNIA REPAIR Left 05/03/2019   Procedure: HERNIA REPAIR INGUINAL ADULT WITH MESH;  Surgeon: Eletha Boas, MD;  Location: [facility] ORS;  Service: General;  Laterality: Left;   INGUINAL HERNIA REPAIR Left 1980   INGUINAL HERNIA REPAIR Right 09/12/2021   Procedure: OPEN REPAIR RIGHT INGUINAL HERNIA WITH MESH;  Surgeon: Eletha Boas, MD;  Location: [facility] ORS;  Service: General;  Laterality: Right;   TOTAL KNEE ARTHROPLASTY Left 07/20/2017   Procedure: LEFT TOTAL KNEE ARTHROPLASTY;  Surgeon: Gerome Charleston, MD;  Location: [facility] ORS;  Service: Orthopedics;  Laterality: Left;  Adductor Block   TRANSTHORACIC ECHOCARDIOGRAM  09/23/2013   EF 55-60%; normal wall motion. Moderately dilated aortic root and ascending aorta; mild AI; moderate bi-atrial enlargement     Social History:   reports that he has never smoked. He has never been exposed to tobacco smoke. He has never used smokeless tobacco. He reports current alcohol use of about  10.0 standard drinks of alcohol per week. He reports that he does not use drugs.   Family History:  His family history includes Heart disease in his brother and father; Stroke in his brother.   Allergies Allergies[1]   Home Medications  Prior to Admission medications  Medication Sig Start Date End Date Taking? Authorizing Provider  AREXVY 120 MCG/0.5ML injection Inject 0.5 mLs into the muscle once. 02/27/24  Yes [provider]  CINNAMON PO Take 2,000 mg by mouth daily.   Yes [provider]  Coenzyme Q10 (CO Q10) 100 MG CAPS Take 100 mg by mouth daily.   Yes [provider]  cyanocobalamin  1000 MCG tablet Take 1,000 mcg by mouth daily.   Yes [provider]  ELIQUIS  5 MG TABS tablet TAKE 1 TABLET BY MOUTH 2 TIMES DAILY. 03/24/24  Yes Croitoru, Mihai, MD  eszopiclone  (LUNESTA ) 1 MG TABS tablet Take 1 tablet (1 mg total) by mouth at bedtime. Take immediately before bedtime 02/22/21  Yes Singh, Prashant K, MD  levothyroxine  (SYNTHROID ) 50 MCG tablet Take 50 mcg by mouth daily before breakfast.   Yes [provider]  Multiple Vitamin (MULTIVITAMIN WITH MINERALS) TABS Take 1 tablet by mouth daily.   Yes [provider]  OVER THE COUNTER MEDICATION Take 2 capsules by mouth daily. Quietum Plus Memory supplement   Yes [provider]  pyridOXINE (VITAMIN B6) 100 MG tablet Take 100 mg by mouth daily.   Yes [provider]  tamsulosin  (FLOMAX ) 0.4 MG CAPS capsule Take 0.8 mg by mouth daily.   Yes [provider]     Critical care time:     CRITICAL CARE Performed by: Donnice JONELLE Beals   Total critical care time: 38 minutes  Critical care time was exclusive of separately billable procedures and treating other patients.  Critical care was necessary to treat or prevent imminent or life-threatening deterioration.  Critical care was time spent personally by me on the following activities: development of treatment  plan with patient and/or surrogate as well as nursing, discussions with consultants, evaluation of patient's response to treatment, examination of patient, obtaining history from patient or surrogate, ordering and performing treatments and interventions, ordering and review of laboratory studies, ordering and review of radiographic studies, pulse oximetry and re-evaluation of patient's condition.   Donnice JONELLE Beals, MD See TRACEY          [1]  Allergies Allergen Reactions   Bee Venom Anaphylaxis   Doxycycline  Hyclate Anaphylaxis   Yellow Jacket Venom Anaphylaxis  Atorvastatin Other (See Comments)   Doxycycline  Hyclate     Other Reaction(s): tachycardia/afib   Finasteride     Other Reaction(s): weak urinary stream   Sulfamethoxazole     Other Reaction(s): rash/tachycardia/hypotension   Bactrim [Sulfamethoxazole-Trimethoprim] Hives and Rash   Rivaroxaban  Rash   "

## 2024-04-23 NOTE — Progress Notes (Signed)
 PT Cancellation Note  Patient Details Name: Tom Norton MRN: [id number] DOB: March 14, 1930   Cancelled Treatment:    Reason Eval/Treat Not Completed: Patient not medically ready. Pt cardioverted overnight, pt continues to be on 3 pressors, and not appropriate for PT eval at this time. Acute PT to return as appropriate, as able to complete PT eval.  Norene Ames, PT, DPT Acute Rehabilitation Services Secure chat preferred Office #: [phone]    Norene CHRISTELLA Ames 04/23/2024, 7:48 AM

## 2024-04-23 NOTE — Progress Notes (Signed)
 " Daily Progress Note   Date: 04/23/2024   Patient Name: Tom Norton  DOB: Sep 12, 1929  MRN: [id number]  Age / Sex: 89 y.o., male  Attending Physician: Rolla Seltzer, MD Primary Care Physician: Charlott Dorn LABOR, MD Admit Date: 04/17/2024 Length of Stay: 6 days  Reason for Follow-up: Establishing goals of care  Past Medical History:  Diagnosis Date   BPH (benign prostatic hyperplasia)    Cardiomegaly    Dilated aortic root    a. 09/2013: 4.2cm by echo.   Dysrhythmia    Enlarged prostate without lower urinary tract symptoms (luts)    GERD (gastroesophageal reflux disease)    rare   History of hiatal hernia    Hyperlipidemia    Hypertension    Hypothyroidism    Insomnia    Junctional bradycardia    OA (osteoarthritis)    Obesity    Paroxysmal A-fib (HCC)    a. 09/23/13 a-fib with RVR and hypotensive, requiring emergent cardioversion in ED   Peripheral neuropathy 07/17/2017   Vitamin D  deficiency     Assessment & Plan:   HPI/Patient Profile:   89 y.o. male  with past medical history of HLD, HTN, osteoarthritis, paroxysmal atrial fibrillation on eliquis  admitted on 04/17/2024  with SDH, SAH, and right femur fracture 2/2 fall from balcony. CT head 04/17/2024 w/o contrast demonstrated left posterior SDH up to 9 mm thickness, multifocal bilateral SAH, linear skull fracture of left parietooccipital bone, right posterior scalp hematoma. Repeat CT head on 04/18/2024 demonstrate increase in multifocal hemorrhage, now bilateral SDH, increased SAH volume, minimal midline shift has increased. Per neurosurgery note by Darnella MD on 04/17/2024 patient not a candidate for hematoma evacuation surgery if his hemorrhages were to expand.    Palliative medicine consulted for goals of care conversation.   SUMMARY OF RECOMMENDATIONS Full code, full scope Palliative medicine team will continue to follow for outcomes and further goals of care conversation Healthcare power of attorney in order Yaniel Limbaugh, [name], Corean [name] Bores)  Symptom Management:  Per primary team  Code Status: Full Code  Prognosis: Unable to determine  Discharge Planning: To Be Determined  Discussed with: Hunsucker MD 04/23/2024 about the patient's overall poor prognosis despite family's continued reiteration of full code and full scope in light of overall deterioration.   Subjective:   Subjective: Chart Reviewed. Updates received. Patient Assessed. Created space and opportunity for patient  and family to explore thoughts and feelings regarding current medical situation.  Today's Discussion: Met with the patient at bedside with wife present. Reviewed overnight course with the wife including increasing vasopressor requirements, blood transfusion, and anemia. Reviewed with wife the medical team's hesitancy to extubate the patient due to vasopressor requirements at this time.  Reviewed with her that although the patient's mentation is improved in some ways that he is still very unstable at this time.  He continues to need PRBC frequently for his drop in hemoglobin.   Wife shares that she is regretful over the current events since she asked him to clean up the deck of the balcony which resulted in his fall and subsequent hospitalization.  Reassured her that she should not feel guilty as it is something she did not have any control over.  Again reviewed with her that the overall prognosis is pretty poor and the chances of meaningful recovery is pretty low despite continued aggressive interventions. Reviewed with her what her hopes are for for the patient and she shares that she wants him to  get better enough to get to the point where he was before this current hospitalization.  Shared with her that the chances of that are pretty low again due to his age and severe injuries. She shares that she at least wants his mind to still be interactive, and that she would be okay with him requiring a lot more assistance  than he has in the past, including being in a skilled nursing facility.   Can discuss CODE STATUS with wife and she shares that she would like for him to remain full code at this time despite the medical team's concern that CPR would cause more harm than good at this stage.  I think that the patient's current mentation is a barrier to the family de-escalating care at this time, as he is showing signs that some parts of them are still there to the family.  Review of Systems  Unable to perform ROS   Objective:   Primary Diagnoses: Present on Admission:  Critical polytrauma  Vent Mode: CPAP;PSV FiO2 (%):  [40 %] 40 % Set Rate:  [16 bmp] 16 bmp Vt Set:  [510 mL] 510 mL PEEP:  [5 cmH20] 5 cmH20 Pressure Support:  [8 cmH20] 8 cmH20 Plateau Pressure:  [12 cmH20] 12 cmH20  Vital Signs:  BP (!) 104/59   Pulse (!) 58   Temp 99.2 F (37.3 C) (Axillary)   Resp 12   Ht 5' 6 (1.676 m)   Wt 89.4 kg   SpO2 96%   BMI 31.80 kg/m   Physical Exam Constitutional:      Appearance: He is ill-appearing.  HENT:     Head: Normocephalic.     Nose: Nose normal.     Mouth/Throat:     Mouth: Mucous membranes are dry.     Comments: ETT in place Eyes:     Extraocular Movements: Extraocular movements intact.  Cardiovascular:     Rate and Rhythm: Normal rate.     Pulses: Normal pulses.  Pulmonary:     Comments: Pressure support ventilation Musculoskeletal:        General: Swelling present.     Right lower leg: Edema present.     Left lower leg: Edema present.  Skin:    General: Skin is warm and dry.  Neurological:     Mental Status: He is alert.    Palliative Assessment/Data: 20%   Existing Vynca/ACP Documentation: Patient's Advance Directives states  If 2 physicians, one of which is a Dispensing Optician, determine that I lack capacity to make or communicate health care decisions and: I have an incurable or irreversible condition that will result in my death within a  relatively short period of time I become unconscious and my health care providers determine that, to a high degree of medical certainty, I will never regain my consciousness I suffer from advanced dementia or any other condition which results in the substantial loss of my cognitive ability and my healthcare providers determine that, to a high degree of medical certainty, this loss is not reversible In those situations I have stated in section 1, I direct that my health care providers: Shall withhold or withdraw life-prolonging measures Exceptions - Artificial Nutrition or Hydration No nutrition or hydration is desired if artificial life support is withheld or withdrawn I wish to be made as comfortable as possible I direct that my health care providers take reasonable steps to keep me as clean, comfortable, and free of pain as possible so that my dignity  is maintained, even though this care may hasten my death If I have appointed a health care agent by executing a health care power of attorney or similar instrument, and that health care agent is acting and available and gives instructions that differ from this Advance Directive, then I direct that: This Advance Directive will OVERRIDE instructions my health care agent gives about prolonging my life Documented Health Care Agents Jane B. Val Schiavo [facility] [place]  Thank you for allowing us  to participate in the care of JURGEN GROENEVELD PMT will continue to support holistically.  I personally spent a total of 35 minutes in the care of the patient today including preparing to see the patient, getting/reviewing separately obtained history, performing a medically appropriate exam/evaluation, counseling and educating, referring and communicating with other health care professionals, and documenting clinical information in the EHR.   [name] [name]  Palliative Medicine Team  Team Phone # [phone]  (Nights/Weekends) 04/23/2024 4:45 PM  "

## 2024-04-23 NOTE — Progress Notes (Signed)
 This chaplain is present for F/U spiritual care with the Pt. wife-Tom Norton. The Pt. follows the chaplain through the room after a Pt. Greeting at the bedside. Through presence and reflective listening, the chaplain understands Tom Norton remains hopeful and is experiencing anticipatory grief as she receives updates from the medical team.   During the visit, with the medical team at the bedside,  the Pt. communicates through the movement of his hands,he wants the tube out of his mouth. The chaplain understands from Tom Norton, she supports the Pt. request for extubation, but re-intubation is not in the plan.  Tom Norton shares multiple communities of faith are praying for the Pt. healing.  The chaplain learns Tom Norton finds respite in playing word games on her phone.   This chaplain is available for F/U spiritual care as needed.  Chaplain Leeroy Hummer [phone]

## 2024-04-23 NOTE — Progress Notes (Signed)
 Date and time results received: 04/23/2024 1333   Test: Hb  Critical Value: 6.3  Name of Provider Notified: Dr Annella  Orders Received? Or Actions Taken?: see new orders

## 2024-04-24 DIAGNOSIS — J9601 Acute respiratory failure with hypoxia: Secondary | ICD-10-CM | POA: Diagnosis not present

## 2024-04-24 DIAGNOSIS — I129 Hypertensive chronic kidney disease with stage 1 through stage 4 chronic kidney disease, or unspecified chronic kidney disease: Secondary | ICD-10-CM | POA: Diagnosis not present

## 2024-04-24 DIAGNOSIS — T07XXXA Unspecified multiple injuries, initial encounter: Secondary | ICD-10-CM | POA: Diagnosis not present

## 2024-04-24 DIAGNOSIS — I4891 Unspecified atrial fibrillation: Secondary | ICD-10-CM | POA: Diagnosis not present

## 2024-04-24 DIAGNOSIS — N189 Chronic kidney disease, unspecified: Secondary | ICD-10-CM | POA: Diagnosis not present

## 2024-04-24 DIAGNOSIS — N179 Acute kidney failure, unspecified: Secondary | ICD-10-CM | POA: Diagnosis not present

## 2024-04-24 DIAGNOSIS — J189 Pneumonia, unspecified organism: Secondary | ICD-10-CM

## 2024-04-24 DIAGNOSIS — R579 Shock, unspecified: Secondary | ICD-10-CM | POA: Diagnosis not present

## 2024-04-24 DIAGNOSIS — D649 Anemia, unspecified: Secondary | ICD-10-CM | POA: Diagnosis not present

## 2024-04-24 LAB — BPAM RBC
Blood Product Expiration Date: 202601032359
Blood Product Expiration Date: 202601052359
Blood Product Expiration Date: 202601052359
Blood Product Expiration Date: 202601142359
Blood Product Expiration Date: 202601142359
Blood Product Expiration Date: 202601232359
Blood Product Expiration Date: 202601232359
Blood Product Expiration Date: 202601242359
Blood Product Expiration Date: 202601272359
Blood Product Expiration Date: 202601282359
ISSUE DATE / TIME: 202512291432
ISSUE DATE / TIME: 202512291711
ISSUE DATE / TIME: 202512291711
ISSUE DATE / TIME: 202512300859
ISSUE DATE / TIME: 202512310135
ISSUE DATE / TIME: 202512310135
ISSUE DATE / TIME: 202512311151
ISSUE DATE / TIME: 202512311211
ISSUE DATE / TIME: 202512311459
ISSUE DATE / TIME: 202512311500
Unit Type and Rh: 5100
Unit Type and Rh: 5100
Unit Type and Rh: 9500
Unit Type and Rh: 9500
Unit Type and Rh: 9500
Unit Type and Rh: 9500
Unit Type and Rh: 9500
Unit Type and Rh: 9500
Unit Type and Rh: 9500
Unit Type and Rh: 9500

## 2024-04-24 LAB — TYPE AND SCREEN
ABO/RH(D): O NEG
Antibody Screen: NEGATIVE
Unit division: 0
Unit division: 0
Unit division: 0
Unit division: 0
Unit division: 0
Unit division: 0
Unit division: 0
Unit division: 0
Unit division: 0
Unit division: 0

## 2024-04-24 LAB — GLUCOSE, CAPILLARY
Glucose-Capillary: 148 mg/dL — ABNORMAL HIGH (ref 70–99)
Glucose-Capillary: 145 mg/dL — ABNORMAL HIGH (ref 70–99)
Glucose-Capillary: 137 mg/dL — ABNORMAL HIGH (ref 70–99)
Glucose-Capillary: 123 mg/dL — ABNORMAL HIGH (ref 70–99)
Glucose-Capillary: 136 mg/dL — ABNORMAL HIGH (ref 70–99)
Glucose-Capillary: 110 mg/dL — ABNORMAL HIGH (ref 70–99)

## 2024-04-24 LAB — CULTURE, RESPIRATORY W GRAM STAIN

## 2024-04-24 LAB — POCT I-STAT 7, (LYTES, BLD GAS, ICA,H+H)
Acid-base deficit: 8 mmol/L — ABNORMAL HIGH (ref 0.0–2.0)
Bicarbonate: 17.7 mmol/L — ABNORMAL LOW (ref 20.0–28.0)
Calcium, Ion: 1.32 mmol/L (ref 1.15–1.40)
HCT: 23 % — ABNORMAL LOW (ref 39.0–52.0)
Hemoglobin: 7.8 g/dL — ABNORMAL LOW (ref 13.0–17.0)
O2 Saturation: 84 %
Patient temperature: 97.6
Potassium: 3.5 mmol/L (ref 3.5–5.1)
Sodium: 148 mmol/L — ABNORMAL HIGH (ref 135–145)
TCO2: 19 mmol/L — ABNORMAL LOW (ref 22–32)
pCO2 arterial: 36.3 mmHg (ref 32–48)
pH, Arterial: 7.293 — ABNORMAL LOW (ref 7.35–7.45)
pO2, Arterial: 52 mmHg — ABNORMAL LOW (ref 83–108)

## 2024-04-24 LAB — BASIC METABOLIC PANEL WITH GFR
Anion gap: 9 (ref 5–15)
Anion gap: 10 (ref 5–15)
BUN: 43 mg/dL — ABNORMAL HIGH (ref 8–23)
BUN: 48 mg/dL — ABNORMAL HIGH (ref 8–23)
CO2: 18 mmol/L — ABNORMAL LOW (ref 22–32)
CO2: 20 mmol/L — ABNORMAL LOW (ref 22–32)
Calcium: 8.1 mg/dL — ABNORMAL LOW (ref 8.9–10.3)
Calcium: 8.8 mg/dL — ABNORMAL LOW (ref 8.9–10.3)
Chloride: 118 mmol/L — ABNORMAL HIGH (ref 98–111)
Chloride: 117 mmol/L — ABNORMAL HIGH (ref 98–111)
Creatinine, Ser: 1.18 mg/dL (ref 0.61–1.24)
Creatinine, Ser: 1.27 mg/dL — ABNORMAL HIGH (ref 0.61–1.24)
GFR, Estimated: 58 mL/min — ABNORMAL LOW
GFR, Estimated: 53 mL/min — ABNORMAL LOW
Glucose, Bld: 149 mg/dL — ABNORMAL HIGH (ref 70–99)
Glucose, Bld: 139 mg/dL — ABNORMAL HIGH (ref 70–99)
Potassium: 3.9 mmol/L (ref 3.5–5.1)
Potassium: 3.5 mmol/L (ref 3.5–5.1)
Sodium: 144 mmol/L (ref 135–145)
Sodium: 146 mmol/L — ABNORMAL HIGH (ref 135–145)

## 2024-04-24 LAB — PREPARE RBC (CROSSMATCH)

## 2024-04-24 LAB — CBC
HCT: 21.3 % — ABNORMAL LOW (ref 39.0–52.0)
Hemoglobin: 7.4 g/dL — ABNORMAL LOW (ref 13.0–17.0)
MCH: 32 pg (ref 26.0–34.0)
MCHC: 34.7 g/dL (ref 30.0–36.0)
MCV: 92.2 fL (ref 80.0–100.0)
Platelets: 65 K/uL — ABNORMAL LOW (ref 150–400)
RBC: 2.31 MIL/uL — ABNORMAL LOW (ref 4.22–5.81)
RDW: 16.5 % — ABNORMAL HIGH (ref 11.5–15.5)
WBC: 10.3 K/uL (ref 4.0–10.5)
nRBC: 14.7 % — ABNORMAL HIGH (ref 0.0–0.2)

## 2024-04-24 LAB — PHOSPHORUS: Phosphorus: 2.8 mg/dL (ref 2.5–4.6)

## 2024-04-24 MED ORDER — CALCIUM GLUCONATE-NACL 2-0.675 GM/100ML-% IV SOLN
2.0000 g | Freq: Once | INTRAVENOUS | Status: AC
  Administered 2024-04-24: 2000 mg via INTRAVENOUS
  Filled 2024-04-24: qty 100

## 2024-04-24 MED ORDER — FUROSEMIDE 10 MG/ML IJ SOLN
80.0000 mg | Freq: Once | INTRAMUSCULAR | Status: AC
  Administered 2024-04-24: 80 mg via INTRAVENOUS
  Filled 2024-04-24: qty 8

## 2024-04-24 MED ORDER — ALBUMIN HUMAN 25 % IV SOLN
25.0000 g | Freq: Four times a day (QID) | INTRAVENOUS | Status: AC
  Administered 2024-04-24 (×3): 25 g via INTRAVENOUS
  Filled 2024-04-24 (×3): qty 100

## 2024-04-24 MED ORDER — SODIUM CHLORIDE 0.9% IV SOLUTION: Freq: Once | INTRAVENOUS | Status: DC

## 2024-04-24 NOTE — Progress Notes (Signed)
 "  Trauma/Critical Care Follow Up Note  Subjective:    Overnight Issues: Continued significant pressor support in ICU  Objective:  Vital signs for last 24 hours: Temp:  [97.1 F (36.2 C)-99.4 F (37.4 C)] 97.1 F (36.2 C) (01/01 0400) Pulse Rate:  [52-194] 122 (01/01 0900) Resp:  [8-28] 13 (01/01 0900) BP: (97-133)/(51-91) 111/81 (01/01 0900) SpO2:  [84 %-100 %] 93 % (01/01 0900) Arterial Line BP: (96-162)/(42-89) 137/65 (01/01 0900) FiO2 (%):  [40 %] 40 % (01/01 0855)  Intake/Output from previous day: 12/31 0701 - 01/01 0700 In: 4956 [I.V.:3464.7; Blood:755.8; IV Piggyback:735.4] Out: 700 [Urine:700]  Intake/Output this shift: Total I/O In: 73.9 [I.V.:59; IV Piggyback:15] Out: 125 [Urine:125]  Vent settings for last 24 hours: Vent Mode: PRVC FiO2 (%):  [40 %] 40 % Set Rate:  [16 bmp] 16 bmp Vt Set:  [510 mL] 510 mL PEEP:  [5 cmH20] 5 cmH20 Pressure Support:  [8 cmH20] 8 cmH20 Plateau Pressure:  [13 cmH20] 13 cmH20  Physical Exam:  Gen: Appears comfortable, minimally responsive, currently intubated Neuro: Intermittently following commands, HEENT: PERRL Neck: supple CV: RRR Pulm: intubated, Abd: soft, NT  , no recent BM GU: urine clear and yellow, +spontaneous voids Extr: wwp, no edema  Right thigh with significant fullness around femur repair, scrotum full of hematoma  Results for orders placed or performed during the hospital encounter of 04/17/24 (from the past 24 hours)  Glucose, capillary     Status: Abnormal   Collection Time: 04/23/24 12:30 PM  Result Value Ref Range   Glucose-Capillary 139 (H) 70 - 99 mg/dL  CBC     Status: Abnormal   Collection Time: 04/23/24 12:46 PM  Result Value Ref Range   WBC 12.5 (H) 4.0 - 10.5 K/uL   RBC 2.04 (L) 4.22 - 5.81 MIL/uL   Hemoglobin 6.3 (LL) 13.0 - 17.0 g/dL   HCT 81.6 (L) 60.9 - 47.9 %   MCV 89.7 80.0 - 100.0 fL   MCH 30.9 26.0 - 34.0 pg   MCHC 34.4 30.0 - 36.0 g/dL   RDW 83.4 (H) 88.4 - 84.4 %   Platelets  74 (L) 150 - 400 K/uL   nRBC 19.6 (H) 0.0 - 0.2 %  Prepare RBC (crossmatch)     Status: None   Collection Time: 04/23/24  1:49 PM  Result Value Ref Range   Order Confirmation      ORDER PROCESSED BY BLOOD BANK Performed at [facility], [address] [place], [place] [address]   Glucose, capillary     Status: Abnormal   Collection Time: 04/23/24  4:07 PM  Result Value Ref Range   Glucose-Capillary 139 (H) 70 - 99 mg/dL  Glucose, capillary     Status: Abnormal   Collection Time: 04/23/24  7:19 PM  Result Value Ref Range   Glucose-Capillary 131 (H) 70 - 99 mg/dL  CBC     Status: Abnormal   Collection Time: 04/23/24  8:59 PM  Result Value Ref Range   WBC 11.0 (H) 4.0 - 10.5 K/uL   RBC 2.42 (L) 4.22 - 5.81 MIL/uL   Hemoglobin 7.7 (L) 13.0 - 17.0 g/dL   HCT 77.4 (L) 60.9 - 47.9 %   MCV 93.0 80.0 - 100.0 fL   MCH 31.8 26.0 - 34.0 pg   MCHC 34.2 30.0 - 36.0 g/dL   RDW 84.5 88.4 - 84.4 %   Platelets 70 (L) 150 - 400 K/uL   nRBC 17.8 (H) 0.0 -  0.2 %  Glucose, capillary     Status: Abnormal   Collection Time: 04/23/24 11:27 PM  Result Value Ref Range   Glucose-Capillary 135 (H) 70 - 99 mg/dL  Glucose, capillary     Status: Abnormal   Collection Time: 04/24/24  3:29 AM  Result Value Ref Range   Glucose-Capillary 148 (H) 70 - 99 mg/dL  Phosphorus     Status: None   Collection Time: 04/24/24  5:49 AM  Result Value Ref Range   Phosphorus 2.8 2.5 - 4.6 mg/dL  CBC     Status: Abnormal   Collection Time: 04/24/24  5:49 AM  Result Value Ref Range   WBC 10.3 4.0 - 10.5 K/uL   RBC 2.31 (L) 4.22 - 5.81 MIL/uL   Hemoglobin 7.4 (L) 13.0 - 17.0 g/dL   HCT 78.6 (L) 60.9 - 47.9 %   MCV 92.2 80.0 - 100.0 fL   MCH 32.0 26.0 - 34.0 pg   MCHC 34.7 30.0 - 36.0 g/dL   RDW 83.4 (H) 88.4 - 84.4 %   Platelets 65 (L) 150 - 400 K/uL   nRBC 14.7 (H) 0.0 - 0.2 %  Basic metabolic panel with GFR     Status: Abnormal   Collection Time: 04/24/24  5:49 AM  Result Value Ref Range   Sodium 144  135 - 145 mmol/L   Potassium 3.9 3.5 - 5.1 mmol/L   Chloride 118 (H) 98 - 111 mmol/L   CO2 18 (L) 22 - 32 mmol/L   Glucose, Bld 149 (H) 70 - 99 mg/dL   BUN 43 (H) 8 - 23 mg/dL   Creatinine, Ser 8.81 0.61 - 1.24 mg/dL   Calcium  8.1 (L) 8.9 - 10.3 mg/dL   GFR, Estimated 58 (L) >60 mL/min   Anion gap 9 5 - 15  Glucose, capillary     Status: Abnormal   Collection Time: 04/24/24  8:33 AM  Result Value Ref Range   Glucose-Capillary 145 (H) 70 - 99 mg/dL    Assessment & Plan:  LOS: 7 days   Additional comments:I reviewed the patient's new clinical lab test results.   and I reviewed the patients new imaging test results.    89 y.o. male s/p fall   Known Injuries: - Right femur fracture - SDH/SAH - Nondepressed parietoocipital bone fracture - Trace left basilar pneumothorax - Bilateral rib fractures - Thoracic TP fractures - Possible T9-10 discoligamentous injury - Left psoas hemorrhage without extravasation   Plan  - Appreciate neurosurgery recommendations, Dr. Garst              - MRI thoracic spine when able              - Repeat [facility] 12/28 with stable ICH, but new occipital stroke, Dr. Merrianne consulted.  - q1h neuro check - CTA Neck pending to evaluate for embolic source - Appreciate orthopaedics recommendations, Dr. Jerri, Dr. Kendal - ORIF right femur 12/29 (Dr. Jerri)  - Acute blood loss anemia - monitor - transfuse 1U today as hgb <8, cardiac risk factors - Shock - likely hemorrhagic, cardiogenic (CHF, afib s/p cardioversion and now amio) may be contributing at age 48, intravascular volume status difficult to assess, CCM managing - appreciate assistance in care. Planning potential diuresis today.  - FEN - strict NPO, cortrak, SLP eval - DVT - SCDs, holding chemical ppx due to bleeding concerns - Dr. Annella (CCM) consulted for assistance in critical care mgmt as well - greatly appreciate assistance in  his care  - Dispo - ICU, clinical update provided to family at bedside  this AM. Family would like to continue with full support.   CRITICAL CARE Performed by: Lonni CHRISTELLA Pizza   Total critical care time: 30 minutes  Critical care time was exclusive of separately billable procedures and treating other patients.  Critical care was necessary to treat or prevent imminent or life-threatening deterioration.  Critical care was time spent personally by me on the following activities: development of treatment plan with patient and/or surrogate as well as nursing, discussions with consultants, evaluation of patient's response to treatment, examination of patient, obtaining history from patient or surrogate, ordering and performing treatments and interventions, ordering and review of laboratory studies, ordering and review of radiographic studies, pulse oximetry and re-evaluation of patient's condition.   Lonni CHRISTELLA Pizza, MD Trauma & General Surgery Please use [email or web address] to contact on call provider  04/24/2024  *Care during the described time interval was provided by me. I have reviewed this patient's available data, including medical history, events of note, physical examination and test results as part of my evaluation.    "

## 2024-04-24 NOTE — Progress Notes (Signed)
 Patient ID: Tom Norton, male   DOB: May 09, 1929, 89 y.o.   MRN: [id number] Examination the right lower extremity patient has decreased swelling left thigh.  There are a few more superficial blisters in the right calf but no signs of compartment syndrome the calf is soft to palpation.  The swelling of the right lower extremity continues to improve.

## 2024-04-24 NOTE — Progress Notes (Signed)
 "  NAME:  DEMTRIUS ROUNDS, MRN:  [id number], DOB:  May 06, 1929, LOS: 7 ADMISSION DATE:  04/17/2024, CONSULTATION DATE:  04/24/2024 REFERRING MD:  Trauma, CHIEF COMPLAINT:  shock   History of Present Illness:  89 year old man admitted as polytrauma after fall from deck with multiple orthopedic injuries, fracture, whom we are consulted for hypoxemia and shock.  Patient was admitted Christmas Day after fall from deck per report.  Initial images reveal left-sided rib fractures, right femur fracture, possible iliac fractures, left posterior subdural hematoma no midline shift, small left posterior parafalcine subdural hematoma, traumatic subarachnoid hemorrhage, skull fracture, trace left apical pneumothorax.  Chest x-ray 12/28 demonstrated left-sided atelectasis and small pleural effusion on my review interpretation.  He went for femur repair 12/29.  He named and remains intubated after procedure.  Chest x-ray 12/29 reveals ET tube placement, similar right-sided infiltrates and atelectasis, new rounded left-sided infiltrate on my review interpretation.  He developed shock now on maximum dose norepinephrine , vasopressin , started on phenylephrine  overnight.  He remains intubated.  Minimal vent settings.  He is on Precedex  for sedation.  Weaning Precedex .  Blood pressure get a bit better.  Review of labs reveal serial low hemoglobins.  He is getting a transfusion today.  He has had transfusions earlier this admission.  Pertinent  Medical History    Significant Hospital Events: Including procedures, antibiotic start and stop dates in addition to other pertinent events   12/25 admitted after fall 12/29 OR fro femur fracture repair  12/30 PCCM consult, AFIB RVR overnight  Interim History / Subjective:  Back in A-fib, RVR with rates 110s 120s.  Still on pressors but weaning, now low-dose.  Urine output picked up, creatinine improved.  Objective    Blood pressure 97/74, pulse (!) 118, temperature (!) 97.1 F  (36.2 C), temperature source Axillary, resp. rate 16, height 5' 5.98 (1.676 m), weight 89.4 kg, SpO2 94%. CVP:  [10 mmHg-21 mmHg] 19 mmHg  Vent Mode: PRVC FiO2 (%):  [40 %] 40 % Set Rate:  [16 bmp] 16 bmp Vt Set:  [510 mL] 510 mL PEEP:  [5 cmH20] 5 cmH20 Pressure Support:  [8 cmH20] 8 cmH20 Plateau Pressure:  [13 cmH20] 13 cmH20   Intake/Output Summary (Last 24 hours) at 04/24/2024 1145 Last data filed at 04/24/2024 1100 Gross per 24 hour  Intake 4261.36 ml  Output 850 ml  Net 3411.36 ml   Filed Weights   04/17/24 1700  Weight: 89.4 kg    Examination: General: Overly, frail, pale Lungs: Coarse sounds, diminished in the bases Cardiovascular: Irregularly irregular, tachycardic Abdomen: Nondistended Extremities: Diffuse edema noted, right leg greater than left Neuro: Did   Resolved problem list   Assessment and Plan   Shock: Presumed distributive or hypovolemic.  Requiring blood transfusions, recent anemia.  Postoperative blood loss.  Critical illness for bone marrow response etc.  Also with what appears to be new infiltrate left lower lobe on chest x-ray 12/29.  Septic shock possible with pulmonary source. -- MAP goal greater than 65, currently on 3 pressors but improving, continue to titrate, active titration of vasoactive infusions -- Continue stress dose steroids, plan to wean 1/2 -- Stopped antihypertensives on [facility]  12/30 -- TTE poor windows but nothing catastrophic  Hospital-acquired pneumonia: Klebsiella Rajan knees on culture. -- Continue cefepime , plan 7 days  Acute hypoxemic respiratory failure: Preceding intubation, remains intubated after procedure 12/29.  Chest x-ray consistent with atelectasis.  Suspect some element of pleural effusion, volume overload, third spacing. -- PRVC,  VAP bundle, stress ulcer prophylaxis  AKI on CKD: Creatinine has wax and waned, mild improvement in urine output and creatinine -- MIVF stopped 12/30 with anasarca -- With improving  vasopressor requirement, anticipate trial of Lasix  1/1 -- Not a dialysis candidate given advanced age  AFIB w/ RVR: New 12/30-1231 AM. Bradycardic on amio gtt, now back in RVR. --continue amio 200 mg BID --No AC with concern for ongoing post surgical bleed  Rest per primary service.  PCCM will continue to follow.  Labs   CBC: Recent Labs  Lab 04/22/24 0306 04/22/24 1308 04/23/24 0022 04/23/24 0522 04/23/24 1246 04/23/24 2059 04/24/24 0549  WBC 10.5  --   --  13.4* 12.5* 11.0* 10.3  HGB 7.3*   < > 6.1* 7.4* 6.3* 7.7* 7.4*  HCT 21.4*   < > 18.1* 21.7* 18.3* 22.5* 21.3*  MCV 89.2  --   --  89.7 89.7 93.0 92.2  PLT 84*  --   --  75* 74* 70* 65*   < > = values in this interval not displayed.    Basic Metabolic Panel: Recent Labs  Lab 04/19/24 0527 04/20/24 0551 04/20/24 0814 04/21/24 0521 04/21/24 1337 04/21/24 1546 04/22/24 0306 04/23/24 0018 04/23/24 0522 04/24/24 0549  NA 142 145   < > 147*   < > 146* 144 144 142 144  K 3.8 3.9   < > 3.7   < > 3.7 4.0 3.7 4.0 3.9  CL 114* 118*  --  119*  --   --  115*  --  116* 118*  CO2 20* 20*  --  20*  --   --  19*  --  17* 18*  GLUCOSE 133* 133*  --  112*  --   --  187*  --  194* 149*  BUN 26* 31*  --  28*  --   --  34*  --  42* 43*  CREATININE 1.04 0.99  --  0.98  --   --  1.27*  --  1.48* 1.18  CALCIUM  8.6* 8.2*  --  8.1*  --   --  7.3*  --  7.3* 8.1*  MG 2.5* 2.4  --  2.3  --   --  1.9  --  2.0  --   PHOS 2.5 2.1*  --  1.9*  --   --  3.6  --  3.9 2.8   < > = values in this interval not displayed.   GFR: Estimated Creatinine Clearance: 41.8 mL/min (by C-G formula based on SCr of 1.18 mg/dL). Recent Labs  Lab 04/17/24 1657 04/17/24 2347 04/23/24 0522 04/23/24 1246 04/23/24 2059 04/24/24 0549  WBC  --    < > 13.4* 12.5* 11.0* 10.3  LATICACIDVEN 2.9*  --   --   --   --   --    < > = values in this interval not displayed.    Liver Function Tests: Recent Labs  Lab 04/17/24 1648  AST 51*  ALT 28  ALKPHOS 77   BILITOT 0.5  PROT 5.8*  ALBUMIN  3.7   No results for input(s): LIPASE, AMYLASE in the last 168 hours. No results for input(s): AMMONIA in the last 168 hours.  ABG    Component Value Date/Time   PHART 7.370 04/23/2024 0018   PCO2ART 28.4 (L) 04/23/2024 0018   PO2ART 91 04/23/2024 0018   HCO3 16.4 (L) 04/23/2024 0018   TCO2 17 (L) 04/23/2024 0018   ACIDBASEDEF 8.0 (H) 04/23/2024 0018  O2SAT 97 04/23/2024 0018     Coagulation Profile: Recent Labs  Lab 04/17/24 1648  INR 1.3*    Cardiac Enzymes: No results for input(s): CKTOTAL, CKMB, CKMBINDEX, TROPONINI in the last 168 hours.  HbA1C: Hgb A1c MFr Bld  Date/Time Value Ref Range Status  04/18/2024 02:13 PM 5.4 4.8 - 5.6 % Final    Comment:    (NOTE) Diagnosis of Diabetes The following HbA1c ranges recommended by the American Diabetes Association (ADA) may be used as an aid in the diagnosis of diabetes mellitus.  Hemoglobin             Suggested A1C NGSP%              Diagnosis  <5.7                   Non Diabetic  5.7-6.4                Pre-Diabetic  >6.4                   Diabetic  <7.0                   Glycemic control for                       adults with diabetes.    09/23/2013 03:00 PM 5.5 <5.7 % Final    Comment:    (NOTE)                                                                       According to the ADA Clinical Practice Recommendations for 2011, when HbA1c is used as a screening test:  >=6.5%   Diagnostic of Diabetes Mellitus           (if abnormal result is confirmed) 5.7-6.4%   Increased risk of developing Diabetes Mellitus References:Diagnosis and Classification of Diabetes Mellitus,Diabetes Care,2011,34(Suppl 1):S62-S69 and Standards of Medical Care in         Diabetes - 2011,Diabetes Care,2011,34 (Suppl 1):S11-S61.    CBG: Recent Labs  Lab 04/23/24 1607 04/23/24 1919 04/23/24 2327 04/24/24 0329 04/24/24 0833  GLUCAP 139* 131* 135* 148* 145*    Review of  Systems:   Unable to obtain due to patient factors  Past Medical History:  He,  has a past medical history of BPH (benign prostatic hyperplasia), Cardiomegaly, Dilated aortic root, Dysrhythmia, Enlarged prostate without lower urinary tract symptoms (luts), GERD (gastroesophageal reflux disease), History of hiatal hernia, Hyperlipidemia, Hypertension, Hypothyroidism, Insomnia, Junctional bradycardia, OA (osteoarthritis), Obesity, Paroxysmal A-fib (HCC), Peripheral neuropathy (07/17/2017), and Vitamin D  deficiency.   Surgical History:   Past Surgical History:  Procedure Laterality Date   CARDIOVERSION  2015   CARPAL TUNNEL RELEASE Left 2019   I & D EXTREMITY Left 02/18/2021   Procedure: IRRIGATION AND DEBRIDEMENT KNEE;  Surgeon: Harden Jerona GAILS, MD;  Location: [facility] OR;  Service: Orthopedics;  Laterality: Left;   INGUINAL HERNIA REPAIR Left 05/03/2019   Procedure: HERNIA REPAIR INGUINAL ADULT WITH MESH;  Surgeon: Eletha Boas, MD;  Location: [facility] ORS;  Service: General;  Laterality: Left;   INGUINAL HERNIA REPAIR Left 1980   INGUINAL HERNIA REPAIR Right 09/12/2021   Procedure:  OPEN REPAIR RIGHT INGUINAL HERNIA WITH MESH;  Surgeon: Eletha Boas, MD;  Location: [facility] ORS;  Service: General;  Laterality: Right;   TOTAL KNEE ARTHROPLASTY Left 07/20/2017   Procedure: LEFT TOTAL KNEE ARTHROPLASTY;  Surgeon: Gerome Charleston, MD;  Location: [facility] ORS;  Service: Orthopedics;  Laterality: Left;  Adductor Block   TRANSTHORACIC ECHOCARDIOGRAM  09/23/2013   EF 55-60%; normal wall motion. Moderately dilated aortic root and ascending aorta; mild AI; moderate bi-atrial enlargement     Social History:   reports that he has never smoked. He has never been exposed to tobacco smoke. He has never used smokeless tobacco. He reports current alcohol use of about 10.0 standard drinks of alcohol per week. He reports that he does not use drugs.   Family History:  His family history includes Heart disease in his brother and  father; Stroke in his brother.   Allergies Allergies[1]   Home Medications  Prior to Admission medications  Medication Sig Start Date End Date Taking? Authorizing Provider  AREXVY 120 MCG/0.5ML injection Inject 0.5 mLs into the muscle once. 02/27/24  Yes [provider]  CINNAMON PO Take 2,000 mg by mouth daily.   Yes [provider]  Coenzyme Q10 (CO Q10) 100 MG CAPS Take 100 mg by mouth daily.   Yes [provider]  cyanocobalamin  1000 MCG tablet Take 1,000 mcg by mouth daily.   Yes [provider]  ELIQUIS  5 MG TABS tablet TAKE 1 TABLET BY MOUTH 2 TIMES DAILY. 03/24/24  Yes Croitoru, Mihai, MD  eszopiclone  (LUNESTA ) 1 MG TABS tablet Take 1 tablet (1 mg total) by mouth at bedtime. Take immediately before bedtime 02/22/21  Yes Singh, Prashant K, MD  levothyroxine  (SYNTHROID ) 50 MCG tablet Take 50 mcg by mouth daily before breakfast.   Yes [provider]  Multiple Vitamin (MULTIVITAMIN WITH MINERALS) TABS Take 1 tablet by mouth daily.   Yes [provider]  OVER THE COUNTER MEDICATION Take 2 capsules by mouth daily. Quietum Plus Memory supplement   Yes [provider]  pyridOXINE (VITAMIN B6) 100 MG tablet Take 100 mg by mouth daily.   Yes [provider]  tamsulosin  (FLOMAX ) 0.4 MG CAPS capsule Take 0.8 mg by mouth daily.   Yes [provider]     Critical care time:     CRITICAL CARE Performed by: Donnice JONELLE Beals   Total critical care time: 32 minutes  Critical care time was exclusive of separately billable procedures and treating other patients.  Critical care was necessary to treat or prevent imminent or life-threatening deterioration.  Critical care was time spent personally by me on the following activities: development of treatment plan with patient and/or surrogate as well as nursing, discussions with consultants, evaluation of patient's response to treatment, examination of patient, obtaining  history from patient or surrogate, ordering and performing treatments and interventions, ordering and review of laboratory studies, ordering and review of radiographic studies, pulse oximetry and re-evaluation of patient's condition.   Donnice JONELLE Beals, MD See TRACEY           [1]  Allergies Allergen Reactions   Bee Venom Anaphylaxis   Doxycycline  Hyclate Anaphylaxis   Yellow Jacket Venom Anaphylaxis   Atorvastatin Other (See Comments)   Doxycycline  Hyclate     Other Reaction(s): tachycardia/afib   Finasteride     Other Reaction(s): weak urinary stream   Sulfamethoxazole     Other Reaction(s): rash/tachycardia/hypotension   Bactrim [Sulfamethoxazole-Trimethoprim] Hives and Rash   Rivaroxaban  Rash   "

## 2024-04-24 NOTE — Progress Notes (Signed)
 Patient ID: Tom Norton, male   DOB: 03/27/1930, 89 y.o.   MRN: [id number] Patient remains intubated and sedated on blood pressure support  No new neurosurgical changes  Continue supportive care

## 2024-04-24 DEATH — deceased

## 2024-04-25 ENCOUNTER — Inpatient Hospital Stay (HOSPITAL_COMMUNITY)

## 2024-04-25 DIAGNOSIS — T07XXXA Unspecified multiple injuries, initial encounter: Secondary | ICD-10-CM | POA: Diagnosis not present

## 2024-04-25 DIAGNOSIS — I4891 Unspecified atrial fibrillation: Secondary | ICD-10-CM | POA: Diagnosis not present

## 2024-04-25 DIAGNOSIS — J189 Pneumonia, unspecified organism: Secondary | ICD-10-CM | POA: Diagnosis not present

## 2024-04-25 DIAGNOSIS — Z7189 Other specified counseling: Secondary | ICD-10-CM | POA: Diagnosis not present

## 2024-04-25 DIAGNOSIS — N189 Chronic kidney disease, unspecified: Secondary | ICD-10-CM | POA: Diagnosis not present

## 2024-04-25 DIAGNOSIS — R579 Shock, unspecified: Secondary | ICD-10-CM | POA: Diagnosis not present

## 2024-04-25 DIAGNOSIS — Z515 Encounter for palliative care: Secondary | ICD-10-CM | POA: Diagnosis not present

## 2024-04-25 DIAGNOSIS — J9601 Acute respiratory failure with hypoxia: Secondary | ICD-10-CM | POA: Diagnosis not present

## 2024-04-25 DIAGNOSIS — N179 Acute kidney failure, unspecified: Secondary | ICD-10-CM | POA: Diagnosis not present

## 2024-04-25 DIAGNOSIS — D649 Anemia, unspecified: Secondary | ICD-10-CM | POA: Diagnosis not present

## 2024-04-25 DIAGNOSIS — I129 Hypertensive chronic kidney disease with stage 1 through stage 4 chronic kidney disease, or unspecified chronic kidney disease: Secondary | ICD-10-CM | POA: Diagnosis not present

## 2024-04-25 LAB — CBC
HCT: 23.9 % — ABNORMAL LOW (ref 39.0–52.0)
Hemoglobin: 8.2 g/dL — ABNORMAL LOW (ref 13.0–17.0)
MCH: 32.4 pg (ref 26.0–34.0)
MCHC: 34.3 g/dL (ref 30.0–36.0)
MCV: 94.5 fL (ref 80.0–100.0)
Platelets: 79 K/uL — ABNORMAL LOW (ref 150–400)
RBC: 2.53 MIL/uL — ABNORMAL LOW (ref 4.22–5.81)
RDW: 17.5 % — ABNORMAL HIGH (ref 11.5–15.5)
WBC: 9.5 K/uL (ref 4.0–10.5)
nRBC: 16.2 % — ABNORMAL HIGH (ref 0.0–0.2)

## 2024-04-25 LAB — COMPREHENSIVE METABOLIC PANEL WITH GFR
ALT: 28 U/L (ref 0–44)
AST: 74 U/L — ABNORMAL HIGH (ref 15–41)
Albumin: 3.5 g/dL (ref 3.5–5.0)
Alkaline Phosphatase: 40 U/L (ref 38–126)
Anion gap: 11 (ref 5–15)
BUN: 52 mg/dL — ABNORMAL HIGH (ref 8–23)
CO2: 20 mmol/L — ABNORMAL LOW (ref 22–32)
Calcium: 8.6 mg/dL — ABNORMAL LOW (ref 8.9–10.3)
Chloride: 116 mmol/L — ABNORMAL HIGH (ref 98–111)
Creatinine, Ser: 1.36 mg/dL — ABNORMAL HIGH (ref 0.61–1.24)
GFR, Estimated: 49 mL/min — ABNORMAL LOW
Glucose, Bld: 151 mg/dL — ABNORMAL HIGH (ref 70–99)
Potassium: 3.3 mmol/L — ABNORMAL LOW (ref 3.5–5.1)
Sodium: 147 mmol/L — ABNORMAL HIGH (ref 135–145)
Total Bilirubin: 1.5 mg/dL — ABNORMAL HIGH (ref 0.0–1.2)
Total Protein: 4.9 g/dL — ABNORMAL LOW (ref 6.5–8.1)

## 2024-04-25 LAB — BASIC METABOLIC PANEL WITH GFR
Anion gap: 12 (ref 5–15)
BUN: 60 mg/dL — ABNORMAL HIGH (ref 8–23)
CO2: 20 mmol/L — ABNORMAL LOW (ref 22–32)
Calcium: 8.4 mg/dL — ABNORMAL LOW (ref 8.9–10.3)
Chloride: 115 mmol/L — ABNORMAL HIGH (ref 98–111)
Creatinine, Ser: 1.56 mg/dL — ABNORMAL HIGH (ref 0.61–1.24)
GFR, Estimated: 41 mL/min — ABNORMAL LOW
Glucose, Bld: 180 mg/dL — ABNORMAL HIGH (ref 70–99)
Potassium: 3.6 mmol/L (ref 3.5–5.1)
Sodium: 148 mmol/L — ABNORMAL HIGH (ref 135–145)

## 2024-04-25 LAB — GLUCOSE, CAPILLARY
Glucose-Capillary: 144 mg/dL — ABNORMAL HIGH (ref 70–99)
Glucose-Capillary: 172 mg/dL — ABNORMAL HIGH (ref 70–99)
Glucose-Capillary: 157 mg/dL — ABNORMAL HIGH (ref 70–99)
Glucose-Capillary: 157 mg/dL — ABNORMAL HIGH (ref 70–99)
Glucose-Capillary: 212 mg/dL — ABNORMAL HIGH (ref 70–99)
Glucose-Capillary: 216 mg/dL — ABNORMAL HIGH (ref 70–99)

## 2024-04-25 LAB — TRIGLYCERIDES: Triglycerides: 83 mg/dL

## 2024-04-25 LAB — PHOSPHORUS: Phosphorus: 3.3 mg/dL (ref 2.5–4.6)

## 2024-04-25 MED ORDER — PROPOFOL 1000 MG/100ML IV EMUL
0.0000 ug/kg/min | INTRAVENOUS | Status: DC
  Administered 2024-04-25: 5 ug/kg/min via INTRAVENOUS
  Administered 2024-04-25 – 2024-04-26 (×4): 25 ug/kg/min via INTRAVENOUS
  Administered 2024-04-27 – 2024-04-29 (×3): 10 ug/kg/min via INTRAVENOUS
  Administered 2024-04-30: 5 ug/kg/min via INTRAVENOUS
  Filled 2024-04-25: qty 200
  Filled 2024-04-25 (×8): qty 100

## 2024-04-25 MED ORDER — ARTIFICIAL TEARS OPHTHALMIC OINT
TOPICAL_OINTMENT | Freq: Four times a day (QID) | OPHTHALMIC | Status: DC
  Administered 2024-04-28 – 2024-05-04 (×16): 1 via OPHTHALMIC
  Filled 2024-04-25: qty 3.5

## 2024-04-25 MED ORDER — AMIODARONE HCL IN DEXTROSE 360-4.14 MG/200ML-% IV SOLN
30.0000 mg/h | INTRAVENOUS | Status: DC
  Administered 2024-04-25 – 2024-04-30 (×10): 30 mg/h via INTRAVENOUS
  Filled 2024-04-25 (×11): qty 200

## 2024-04-25 MED ORDER — VASOPRESSIN 20 UNITS/100 ML INFUSION FOR SHOCK
0.0000 [IU]/min | INTRAVENOUS | Status: AC
  Administered 2024-04-25 – 2024-05-05 (×19): 0.03 [IU]/min via INTRAVENOUS
  Filled 2024-04-25 (×21): qty 100

## 2024-04-25 MED ORDER — HYDROCORTISONE SOD SUC (PF) 100 MG IJ SOLR
100.0000 mg | Freq: Two times a day (BID) | INTRAMUSCULAR | Status: AC
  Administered 2024-04-25 – 2024-04-26 (×2): 100 mg via INTRAVENOUS
  Filled 2024-04-25 (×2): qty 2

## 2024-04-25 MED ORDER — AMIODARONE HCL IN DEXTROSE 360-4.14 MG/200ML-% IV SOLN
60.0000 mg/h | INTRAVENOUS | Status: DC
  Administered 2024-04-25 (×2): 60 mg/h via INTRAVENOUS
  Filled 2024-04-25: qty 200

## 2024-04-25 MED ORDER — AMIODARONE LOAD VIA INFUSION
150.0000 mg | Freq: Once | INTRAVENOUS | Status: AC
  Administered 2024-04-25: 150 mg via INTRAVENOUS
  Filled 2024-04-25: qty 83.34

## 2024-04-25 MED ORDER — OXYCODONE HCL 5 MG PO TABS
5.0000 mg | ORAL_TABLET | Freq: Four times a day (QID) | ORAL | Status: DC
  Administered 2024-04-25 – 2024-05-02 (×28): 5 mg
  Filled 2024-04-25 (×28): qty 1

## 2024-04-25 MED ORDER — FUROSEMIDE 10 MG/ML IJ SOLN
80.0000 mg | Freq: Four times a day (QID) | INTRAMUSCULAR | Status: DC
  Administered 2024-04-25: 80 mg via INTRAVENOUS
  Filled 2024-04-25 (×2): qty 8

## 2024-04-25 MED ORDER — FREE WATER
200.0000 mL | Status: DC
  Administered 2024-04-25 – 2024-04-26 (×10): 200 mL

## 2024-04-25 NOTE — Progress Notes (Signed)
 PT Cancellation Note  Patient Details Name: Tom Norton MRN: [id number] DOB: 1929-12-19   Cancelled Treatment:    Reason Eval/Treat Not Completed: Patient not medically ready (intubated and still needs medical clearance for mobility with spinal injuries. will sign off and await new order)   [name] B [name] 04/25/2024, 7:58 AM [name], PT Acute Rehabilitation Services Office: [phone]

## 2024-04-25 NOTE — Progress Notes (Signed)
 OT Cancellation Note  Patient Details Name: Tom Norton MRN: [id number] DOB: 21-Sep-1929   Cancelled Treatment:    Reason Eval/Treat Not Completed: Patient not medically ready (pt not medically appropriate to participate. OT to sign off per dpt policy. Please re-order when appropriate.)  [name] 04/25/2024, 8:07 AM

## 2024-04-25 NOTE — Progress Notes (Signed)
 "  NAME:  Tom Norton, MRN:  [id number], DOB:  20-May-1929, LOS: 8 ADMISSION DATE:  04/17/2024, CONSULTATION DATE:  04/25/2024 REFERRING MD:  Trauma, CHIEF COMPLAINT:  shock   History of Present Illness:  89 year old man admitted as polytrauma after fall from deck with multiple orthopedic injuries, fracture, whom we are consulted for hypoxemia and shock.  Patient was admitted Christmas Day after fall from deck per report.  Initial images reveal left-sided rib fractures, right femur fracture, possible iliac fractures, left posterior subdural hematoma no midline shift, small left posterior parafalcine subdural hematoma, traumatic subarachnoid hemorrhage, skull fracture, trace left apical pneumothorax.  Chest x-ray 12/28 demonstrated left-sided atelectasis and small pleural effusion on my review interpretation.  He went for femur repair 12/29.  He named and remains intubated after procedure.  Chest x-ray 12/29 reveals ET tube placement, similar right-sided infiltrates and atelectasis, new rounded left-sided infiltrate on my review interpretation.  He developed shock now on maximum dose norepinephrine , vasopressin , started on phenylephrine  overnight.  He remains intubated.  Minimal vent settings.  He is on Precedex  for sedation.  Weaning Precedex .  Blood pressure get a bit better.  Review of labs reveal serial low hemoglobins.  He is getting a transfusion today.  He has had transfusions earlier this admission.  Pertinent  Medical History    Significant Hospital Events: Including procedures, antibiotic start and stop dates in addition to other pertinent events   12/25 admitted after fall 12/29 OR fro femur fracture repair  12/30 PCCM consult, AFIB RVR overnight  Interim History / Subjective:  Remains A-fib with RVR.  Oxygen requirement slightly increased.  50% FiO2 on vent.  Diuresed or urinated fairly well with 2 doses of IV Lasix  yesterday.  Was net negative but barely so.  Increasing intensity of  Lasix .  Continue to wean vasopressors.  Objective    Blood pressure (!) 81/65, pulse (!) 114, temperature 98.1 F (36.7 C), temperature source Axillary, resp. rate 13, height 5' 11 (1.803 m), weight 113.2 kg, SpO2 99%. CVP:  [1 mmHg-20 mmHg] 1 mmHg  Vent Mode: PRVC FiO2 (%):  [40 %-50 %] 50 % Set Rate:  [16 bmp-20 bmp] 20 bmp Vt Set:  [510 mL-560 mL] 560 mL PEEP:  [5 cmH20-8 cmH20] 8 cmH20 Plateau Pressure:  [10 cmH20-15 cmH20] 10 cmH20   Intake/Output Summary (Last 24 hours) at 04/25/2024 1054 Last data filed at 04/25/2024 1000 Gross per 24 hour  Intake 2605.57 ml  Output 2495 ml  Net 110.57 ml   Filed Weights   04/17/24 1700 04/25/24 0500  Weight: 89.4 kg 113.2 kg    Examination: General: Frail, breathing over vent Lungs: No distress, normal breathing pattern on ventilator Cardiovascular: Irregularly irregular, A-fib on monitor, with RVR Abdomen: Nondistended Extremities: Diffuse edema noted, right leg greater than left Neuro: Does not follow commands   Resolved problem list   Assessment and Plan   Shock: Presumed distributive or hypovolemic.  Requiring blood transfusions, recent anemia.  Postoperative blood loss.  Critical illness for bone marrow response etc.  Also with what appears to be new infiltrate left lower lobe on chest x-ray 12/29.  Septic shock possible with pulmonary source.  Overall improving with time with stable bleed and treatment of pneumonia. -- MAP goal greater than 65, decreasing dose of pressor -- Continue stress dose steroids, wean to off 1/3 -- Stopped antihypertensives on [facility]  12/30 -- TTE poor windows but nothing catastrophic  Hospital-acquired pneumonia: Klebsiella aerogenes on culture. -- Continue cefepime , plan  7 days  Acute hypoxemic respiratory failure: Preceding intubation, remains intubated after procedure 12/29.  Chest x-ray consistent with atelectasis.  Suspect some element of pleural effusion, volume overload, third spacing. -- PRVC,  VAP bundle, stress ulcer prophylaxis -- Antibiotics and diuresis  AKI on CKD: Creatinine has wax and waned, mild improvement in urine output and creatinine -- MIVF stopped 12/30 with anasarca -- Good urine output with Lasix , increase to 3 times daily to help mobilize fluid -- Not a dialysis candidate given advanced age  AFIB w/ RVR: New 12/30-1231 AM. Bradycardic on amio gtt, now back in RVR. --continue amio 200 mg BID --No AC with concern for ongoing post surgical bleed  Rest per primary service.  PCCM will continue to follow.  Labs   CBC: Recent Labs  Lab 04/23/24 0522 04/23/24 1246 04/23/24 2059 04/24/24 0549 04/24/24 1818 04/25/24 0541  WBC 13.4* 12.5* 11.0* 10.3  --  9.5  HGB 7.4* 6.3* 7.7* 7.4* 7.8* 8.2*  HCT 21.7* 18.3* 22.5* 21.3* 23.0* 23.9*  MCV 89.7 89.7 93.0 92.2  --  94.5  PLT 75* 74* 70* 65*  --  79*    Basic Metabolic Panel: Recent Labs  Lab 04/19/24 0527 04/20/24 0551 04/20/24 0814 04/21/24 0521 04/21/24 1337 04/22/24 0306 04/23/24 0018 04/23/24 0522 04/24/24 0549 04/24/24 1818 04/24/24 1924 04/25/24 0541  NA 142 145   < > 147*   < > 144   < > 142 144 148* 146* 147*  K 3.8 3.9   < > 3.7   < > 4.0   < > 4.0 3.9 3.5 3.5 3.3*  CL 114* 118*  --  119*  --  115*  --  116* 118*  --  117* 116*  CO2 20* 20*  --  20*  --  19*  --  17* 18*  --  20* 20*  GLUCOSE 133* 133*  --  112*  --  187*  --  194* 149*  --  139* 151*  BUN 26* 31*  --  28*  --  34*  --  42* 43*  --  48* 52*  CREATININE 1.04 0.99  --  0.98  --  1.27*  --  1.48* 1.18  --  1.27* 1.36*  CALCIUM  8.6* 8.2*  --  8.1*  --  7.3*  --  7.3* 8.1*  --  8.8* 8.6*  MG 2.5* 2.4  --  2.3  --  1.9  --  2.0  --   --   --   --   PHOS 2.5 2.1*  --  1.9*  --  3.6  --  3.9 2.8  --   --  3.3   < > = values in this interval not displayed.   GFR: Estimated Creatinine Clearance: 44.4 mL/min (A) (by C-G formula based on SCr of 1.36 mg/dL (H)). Recent Labs  Lab 04/23/24 1246 04/23/24 2059 04/24/24 0549  04/25/24 0541  WBC 12.5* 11.0* 10.3 9.5    Liver Function Tests: Recent Labs  Lab 04/25/24 0541  AST 74*  ALT 28  ALKPHOS 40  BILITOT 1.5*  PROT 4.9*  ALBUMIN  3.5   No results for input(s): LIPASE, AMYLASE in the last 168 hours. No results for input(s): AMMONIA in the last 168 hours.  ABG    Component Value Date/Time   PHART 7.293 (L) 04/24/2024 1818   PCO2ART 36.3 04/24/2024 1818   PO2ART 52 (L) 04/24/2024 1818   HCO3 17.7 (L) 04/24/2024 1818   TCO2 19 (  L) 04/24/2024 1818   ACIDBASEDEF 8.0 (H) 04/24/2024 1818   O2SAT 84 04/24/2024 1818     Coagulation Profile: No results for input(s): INR, PROTIME in the last 168 hours.   Cardiac Enzymes: No results for input(s): CKTOTAL, CKMB, CKMBINDEX, TROPONINI in the last 168 hours.  HbA1C: Hgb A1c MFr Bld  Date/Time Value Ref Range Status  04/18/2024 02:13 PM 5.4 4.8 - 5.6 % Final    Comment:    (NOTE) Diagnosis of Diabetes The following HbA1c ranges recommended by the American Diabetes Association (ADA) may be used as an aid in the diagnosis of diabetes mellitus.  Hemoglobin             Suggested A1C NGSP%              Diagnosis  <5.7                   Non Diabetic  5.7-6.4                Pre-Diabetic  >6.4                   Diabetic  <7.0                   Glycemic control for                       adults with diabetes.    09/23/2013 03:00 PM 5.5 <5.7 % Final    Comment:    (NOTE)                                                                       According to the ADA Clinical Practice Recommendations for 2011, when HbA1c is used as a screening test:  >=6.5%   Diagnostic of Diabetes Mellitus           (if abnormal result is confirmed) 5.7-6.4%   Increased risk of developing Diabetes Mellitus References:Diagnosis and Classification of Diabetes Mellitus,Diabetes Care,2011,34(Suppl 1):S62-S69 and Standards of Medical Care in         Diabetes - 2011,Diabetes Care,2011,34 (Suppl  1):S11-S61.    CBG: Recent Labs  Lab 04/24/24 1609 04/24/24 1946 04/24/24 2323 04/25/24 0328 04/25/24 0759  GLUCAP 123* 136* 110* 144* 172*    Review of Systems:   Unable to obtain due to patient factors  Past Medical History:  He,  has a past medical history of BPH (benign prostatic hyperplasia), Cardiomegaly, Dilated aortic root, Dysrhythmia, Enlarged prostate without lower urinary tract symptoms (luts), GERD (gastroesophageal reflux disease), History of hiatal hernia, Hyperlipidemia, Hypertension, Hypothyroidism, Insomnia, Junctional bradycardia, OA (osteoarthritis), Obesity, Paroxysmal A-fib (HCC), Peripheral neuropathy (07/17/2017), and Vitamin D  deficiency.   Surgical History:   Past Surgical History:  Procedure Laterality Date   CARDIOVERSION  2015   CARPAL TUNNEL RELEASE Left 2019   I & D EXTREMITY Left 02/18/2021   Procedure: IRRIGATION AND DEBRIDEMENT KNEE;  Surgeon: Harden Jerona GAILS, MD;  Location: [facility] OR;  Service: Orthopedics;  Laterality: Left;   INGUINAL HERNIA REPAIR Left 05/03/2019   Procedure: HERNIA REPAIR INGUINAL ADULT WITH MESH;  Surgeon: Eletha Boas, MD;  Location: [facility] ORS;  Service: General;  Laterality: Left;   INGUINAL HERNIA  REPAIR Left 1980   INGUINAL HERNIA REPAIR Right 09/12/2021   Procedure: OPEN REPAIR RIGHT INGUINAL HERNIA WITH MESH;  Surgeon: Eletha Boas, MD;  Location: [facility] ORS;  Service: General;  Laterality: Right;   TOTAL KNEE ARTHROPLASTY Left 07/20/2017   Procedure: LEFT TOTAL KNEE ARTHROPLASTY;  Surgeon: Gerome Charleston, MD;  Location: [facility] ORS;  Service: Orthopedics;  Laterality: Left;  Adductor Block   TRANSTHORACIC ECHOCARDIOGRAM  09/23/2013   EF 55-60%; normal wall motion. Moderately dilated aortic root and ascending aorta; mild AI; moderate bi-atrial enlargement     Social History:   reports that he has never smoked. He has never been exposed to tobacco smoke. He has never used smokeless tobacco. He reports current alcohol use of about  10.0 standard drinks of alcohol per week. He reports that he does not use drugs.   Family History:  His family history includes Heart disease in his brother and father; Stroke in his brother.   Allergies Allergies[1]   Home Medications  Prior to Admission medications  Medication Sig Start Date End Date Taking? Authorizing Provider  AREXVY 120 MCG/0.5ML injection Inject 0.5 mLs into the muscle once. 02/27/24  Yes [provider]  CINNAMON PO Take 2,000 mg by mouth daily.   Yes [provider]  Coenzyme Q10 (CO Q10) 100 MG CAPS Take 100 mg by mouth daily.   Yes [provider]  cyanocobalamin  1000 MCG tablet Take 1,000 mcg by mouth daily.   Yes [provider]  ELIQUIS  5 MG TABS tablet TAKE 1 TABLET BY MOUTH 2 TIMES DAILY. 03/24/24  Yes Croitoru, Mihai, MD  eszopiclone  (LUNESTA ) 1 MG TABS tablet Take 1 tablet (1 mg total) by mouth at bedtime. Take immediately before bedtime 02/22/21  Yes Singh, Prashant K, MD  levothyroxine  (SYNTHROID ) 50 MCG tablet Take 50 mcg by mouth daily before breakfast.   Yes [provider]  Multiple Vitamin (MULTIVITAMIN WITH MINERALS) TABS Take 1 tablet by mouth daily.   Yes [provider]  OVER THE COUNTER MEDICATION Take 2 capsules by mouth daily. Quietum Plus Memory supplement   Yes [provider]  pyridOXINE (VITAMIN B6) 100 MG tablet Take 100 mg by mouth daily.   Yes [provider]  tamsulosin  (FLOMAX ) 0.4 MG CAPS capsule Take 0.8 mg by mouth daily.   Yes [provider]     Critical care time:     CRITICAL CARE Performed by: Donnice JONELLE Beals   Total critical care time: 33 minutes  Critical care time was exclusive of separately billable procedures and treating other patients.  Critical care was necessary to treat or prevent imminent or life-threatening deterioration.  Critical care was time spent personally by me on the following activities: development of treatment  plan with patient and/or surrogate as well as nursing, discussions with consultants, evaluation of patient's response to treatment, examination of patient, obtaining history from patient or surrogate, ordering and performing treatments and interventions, ordering and review of laboratory studies, ordering and review of radiographic studies, pulse oximetry and re-evaluation of patient's condition.   Donnice JONELLE Beals, MD See TRACEY            [1]  Allergies Allergen Reactions   Bee Venom Anaphylaxis   Doxycycline  Hyclate Anaphylaxis   Yellow Jacket Venom Anaphylaxis   Atorvastatin Other (See Comments)   Doxycycline  Hyclate     Other Reaction(s): tachycardia/afib   Finasteride     Other Reaction(s): weak urinary stream   Sulfamethoxazole  Other Reaction(s): rash/tachycardia/hypotension   Bactrim [Sulfamethoxazole-Trimethoprim] Hives and Rash   Rivaroxaban  Rash   "

## 2024-04-25 NOTE — TOC Progression Note (Signed)
 Transition of Care [facility]) - Progression Note    Patient Details  Name: Tom Norton MRN: [id number] Date of Birth: 12/24/1929  Transition of [facility] CM/SW Contact  Walterine Amodei M, RN Phone Number: 04/25/2024, 3:16 PM  Clinical Narrative:    Patient remains intubated with septic shock and hospital acquired pneumonia.  Family would like to continue with full support.   Inpatient Care Management will continue to follow as patient progresses.      Barriers to Discharge: Continued Medical Work up                   Living arrangements for the past 2 months: Single Family Home                                       Social Drivers of Health (SDOH) Interventions SDOH Screenings   Food Insecurity: No Food Insecurity (04/22/2024)  Housing: Low Risk (04/22/2024)  Transportation Needs: No Transportation Needs (04/22/2024)  Utilities: Patient Unable To Answer (04/18/2024)  Social Connections: Socially Integrated (04/22/2024)  Tobacco Use: Low Risk (04/17/2024)    Readmission Risk Interventions     No data to display         Mliss MICAEL Fass, RN, BSN  Trauma/Neuro ICU Case Manager [phone]

## 2024-04-25 NOTE — Progress Notes (Signed)
 "  Trauma/Critical Care Follow Up Note  Subjective:    Overnight Issues: Received 1 pRBCs. Hb 8.2 from 7.4. Off pressors this morning. Vent up to 50%, 8.   Objective:  Vital signs for last 24 hours: Temp:  [97.2 F (36.2 C)-98.1 F (36.7 C)] 98.1 F (36.7 C) (01/02 0800) Pulse Rate:  [90-137] 126 (01/02 0730) Resp:  [10-42] 14 (01/02 0730) BP: (87-117)/(65-86) 95/65 (01/02 0700) SpO2:  [87 %-100 %] 98 % (01/02 0730) Arterial Line BP: (97-149)/(46-73) 120/59 (01/02 0730) FiO2 (%):  [40 %-50 %] 50 % (01/02 0411) Weight:  [886.7 kg] 113.2 kg (01/02 0500)  Intake/Output from previous day: 01/01 0701 - 01/02 0700 In: 2495 [I.V.:1143.6; Blood:330; NG/GT:520.4; IV Piggyback:501] Out: 2695 [Urine:2695]  Intake/Output this shift: Total I/O In: 200 [NG/GT:200] Out: -   Vent settings for last 24 hours: Vent Mode: PRVC FiO2 (%):  [40 %-50 %] 50 % Set Rate:  [16 bmp-20 bmp] 20 bmp Vt Set:  [510 mL-560 mL] 560 mL PEEP:  [5 cmH20-8 cmH20] 8 cmH20 Plateau Pressure:  [14 cmH20-15 cmH20] 14 cmH20  Physical Exam:  Gen: Appears comfortable, minimally responsive, currently intubated Neuro: Intermittently following commands, HEENT: PERRL Neck: supple CV: RRR Pulm: intubated, Abd: soft, NT  , no recent BM GU: urine clear and yellow, +spontaneous voids Extr: wwp, no edema  Right thigh with significant fullness around femur repair, scrotum full of hematoma  Results for orders placed or performed during the hospital encounter of 04/17/24 (from the past 24 hours)  Prepare RBC (crossmatch)     Status: None   Collection Time: 04/24/24 10:06 AM  Result Value Ref Range   Order Confirmation      ORDER PROCESSED BY BLOOD BANK Performed at [facility], [address] [place], [place] [address]   Type and screen [facility]     Status: None (Preliminary result)   Collection Time: 04/24/24 10:16 AM  Result Value Ref Range   ABO/RH(D) O NEG    Antibody Screen NEG     Sample Expiration 04/27/2024,2359    Unit Number [id number]    Blood Component Type RED CELLS,LR    Unit division 00    Status of Unit ISSUED    Transfusion Status OK TO TRANSFUSE    Crossmatch Result COMPATIBLE   Glucose, capillary     Status: Abnormal   Collection Time: 04/24/24 12:14 PM  Result Value Ref Range   Glucose-Capillary 137 (H) 70 - 99 mg/dL  Glucose, capillary     Status: Abnormal   Collection Time: 04/24/24  4:09 PM  Result Value Ref Range   Glucose-Capillary 123 (H) 70 - 99 mg/dL  I-STAT 7, (LYTES, BLD GAS, ICA, H+H)     Status: Abnormal   Collection Time: 04/24/24  6:18 PM  Result Value Ref Range   pH, Arterial 7.293 (L) 7.35 - 7.45   pCO2 arterial 36.3 32 - 48 mmHg   pO2, Arterial 52 (L) 83 - 108 mmHg   Bicarbonate 17.7 (L) 20.0 - 28.0 mmol/L   TCO2 19 (L) 22 - 32 mmol/L   O2 Saturation 84 %   Acid-base deficit 8.0 (H) 0.0 - 2.0 mmol/L   Sodium 148 (H) 135 - 145 mmol/L   Potassium 3.5 3.5 - 5.1 mmol/L   Calcium , Ion 1.32 1.15 - 1.40 mmol/L   HCT 23.0 (L) 39.0 - 52.0 %   Hemoglobin 7.8 (L) 13.0 - 17.0 g/dL   Patient temperature 02.3 F  Collection site Femoral    Drawn by HIDE    Sample type ARTERIAL   Basic metabolic panel     Status: Abnormal   Collection Time: 04/24/24  7:24 PM  Result Value Ref Range   Sodium 146 (H) 135 - 145 mmol/L   Potassium 3.5 3.5 - 5.1 mmol/L   Chloride 117 (H) 98 - 111 mmol/L   CO2 20 (L) 22 - 32 mmol/L   Glucose, Bld 139 (H) 70 - 99 mg/dL   BUN 48 (H) 8 - 23 mg/dL   Creatinine, Ser 8.72 (H) 0.61 - 1.24 mg/dL   Calcium  8.8 (L) 8.9 - 10.3 mg/dL   GFR, Estimated 53 (L) >60 mL/min   Anion gap 10 5 - 15  Glucose, capillary     Status: Abnormal   Collection Time: 04/24/24  7:46 PM  Result Value Ref Range   Glucose-Capillary 136 (H) 70 - 99 mg/dL  Glucose, capillary     Status: Abnormal   Collection Time: 04/24/24 11:23 PM  Result Value Ref Range   Glucose-Capillary 110 (H) 70 - 99 mg/dL  Glucose, capillary      Status: Abnormal   Collection Time: 04/25/24  3:28 AM  Result Value Ref Range   Glucose-Capillary 144 (H) 70 - 99 mg/dL  Phosphorus     Status: None   Collection Time: 04/25/24  5:41 AM  Result Value Ref Range   Phosphorus 3.3 2.5 - 4.6 mg/dL  Triglycerides     Status: None   Collection Time: 04/25/24  5:41 AM  Result Value Ref Range   Triglycerides 83 <150 mg/dL  CBC     Status: Abnormal   Collection Time: 04/25/24  5:41 AM  Result Value Ref Range   WBC 9.5 4.0 - 10.5 K/uL   RBC 2.53 (L) 4.22 - 5.81 MIL/uL   Hemoglobin 8.2 (L) 13.0 - 17.0 g/dL   HCT 76.0 (L) 60.9 - 47.9 %   MCV 94.5 80.0 - 100.0 fL   MCH 32.4 26.0 - 34.0 pg   MCHC 34.3 30.0 - 36.0 g/dL   RDW 82.4 (H) 88.4 - 84.4 %   Platelets 79 (L) 150 - 400 K/uL   nRBC 16.2 (H) 0.0 - 0.2 %  Comprehensive metabolic panel     Status: Abnormal   Collection Time: 04/25/24  5:41 AM  Result Value Ref Range   Sodium 147 (H) 135 - 145 mmol/L   Potassium 3.3 (L) 3.5 - 5.1 mmol/L   Chloride 116 (H) 98 - 111 mmol/L   CO2 20 (L) 22 - 32 mmol/L   Glucose, Bld 151 (H) 70 - 99 mg/dL   BUN 52 (H) 8 - 23 mg/dL   Creatinine, Ser 8.63 (H) 0.61 - 1.24 mg/dL   Calcium  8.6 (L) 8.9 - 10.3 mg/dL   Total Protein 4.9 (L) 6.5 - 8.1 g/dL   Albumin  3.5 3.5 - 5.0 g/dL   AST 74 (H) 15 - 41 U/L   ALT 28 0 - 44 U/L   Alkaline Phosphatase 40 38 - 126 U/L   Total Bilirubin 1.5 (H) 0.0 - 1.2 mg/dL   GFR, Estimated 49 (L) >60 mL/min   Anion gap 11 5 - 15  Glucose, capillary     Status: Abnormal   Collection Time: 04/25/24  7:59 AM  Result Value Ref Range   Glucose-Capillary 172 (H) 70 - 99 mg/dL    Assessment & Plan:  LOS: 8 days   Additional comments:I reviewed the patient's new  clinical lab test results.   and I reviewed the patients new imaging test results.    89 y.o. male s/p fall   Known Injuries: - Right femur fracture - SDH/SAH - Nondepressed parietoocipital bone fracture - Trace left basilar pneumothorax - Bilateral rib  fractures - Thoracic TP fractures - Possible T9-10 discoligamentous injury - Left psoas hemorrhage without extravasation   Plan  - Appreciate neurosurgery recommendations, Dr. Garst              - MRI thoracic spine when able              - Repeat [facility] 12/28 with stable ICH, but new occipital stroke, Dr. Merrianne consulted.  - q1h neuro check - CTA Neck pending to evaluate for embolic source - Appreciate orthopaedics recommendations, Dr. Jerri, Dr. Kendal - ORIF right femur 12/29 (Dr. Jerri)  - Acute blood loss anemia - monitor - s/p 1 pRBC 1/1 with appropriate response.  - Shock - likely hemorrhagic, cardiogenic (CHF, afib s/p cardioversion and now amio) may be contributing at age 45, intravascular volume status difficult to assess, CCM managing - appreciate assistance in care. Planning potential diuresis today.  - FEN - strict NPO, cortrak - DVT - SCDs, holding chemical ppx due to bleeding concerns - Dr. Annella (CCM) consulted for assistance in critical care mgmt as well - greatly appreciate assistance in his care  - Dispo - ICU, clinical update provided to family at bedside this AM. Family would like to continue with full support.   CRITICAL CARE Performed by: Cordella DELENA Idler   Total critical care time: 30 minutes  Cordella DELENA Idler, MD Trauma & General Surgery Please use [email or web address] to contact on call provider  04/25/2024  *Care during the described time interval was provided by me. I have reviewed this patient's available data, including medical history, events of note, physical examination and test results as part of my evaluation.    "

## 2024-04-25 NOTE — Progress Notes (Signed)
 Subjective: NAEs  Objective: Vital signs in last 24 hours: Temp:  [97.2 F (36.2 C)-98.1 F (36.7 C)] 98.1 F (36.7 C) (01/02 0800) Pulse Rate:  [90-137] 114 (01/02 1000) Resp:  [10-42] 13 (01/02 1000) BP: (81-117)/(61-86) 81/65 (01/02 1000) SpO2:  [87 %-100 %] 99 % (01/02 1000) Arterial Line BP: (97-149)/(46-73) 110/55 (01/02 1000) FiO2 (%):  [40 %-50 %] 50 % (01/02 0845) Weight:  [886.7 kg] 113.2 kg (01/02 0500)  Intake/Output from previous day: 01/01 0701 - 01/02 0700 In: 2495 [I.V.:1143.6; Blood:330; NG/GT:520.4; IV Piggyback:501] Out: 2695 [Urine:2695] Intake/Output this shift: Total I/O In: 389.9 [I.V.:84.9; NG/GT:305] Out: -   Intubated sedated Per nursing, without sedation patient becomes restless and goes into Afib, but does follow commands and MAEs  Lab Results: Recent Labs    04/24/24 0549 04/24/24 1818 04/25/24 0541  WBC 10.3  --  9.5  HGB 7.4* 7.8* 8.2*  HCT 21.3* 23.0* 23.9*  PLT 65*  --  79*   BMET Recent Labs    04/24/24 1924 04/25/24 0541  NA 146* 147*  K 3.5 3.3*  CL 117* 116*  CO2 20* 20*  GLUCOSE 139* 151*  BUN 48* 52*  CREATININE 1.27* 1.36*  CALCIUM  8.8* 8.6*    Studies/Results: No results found.  Assessment/Plan: 89 year old man critically ill with with polytrauma, numerous fractures including T9-10 disc space anterior osteophyte fracture.  Also suffered traumatic brain injury with scattered small traumatic subarachnoid hemorrhage and contusions, stable on imaging - When patient more stable, recommend MRI T-spine without contrast to rule out discal ligamentous injury and posterior ligamentous complex injury.    [name] 04/25/2024, 10:14 AM

## 2024-04-25 NOTE — Progress Notes (Signed)
 " Daily Progress Note   Date: 04/25/2024   Patient Name: Tom Norton  DOB: Jun 27, 1929  MRN: [id number]  Age / Sex: 89 y.o., male  Attending Physician: Rolla Seltzer, MD Primary Care Physician: Charlott Dorn LABOR, MD Admit Date: 04/17/2024 Length of Stay: 8 days  Reason for Follow-up: Establishing goals of care  Past Medical History:  Diagnosis Date   BPH (benign prostatic hyperplasia)    Cardiomegaly    Dilated aortic root    a. 09/2013: 4.2cm by echo.   Dysrhythmia    Enlarged prostate without lower urinary tract symptoms (luts)    GERD (gastroesophageal reflux disease)    rare   History of hiatal hernia    Hyperlipidemia    Hypertension    Hypothyroidism    Insomnia    Junctional bradycardia    OA (osteoarthritis)    Obesity    Paroxysmal A-fib (HCC)    a. 09/23/13 a-fib with RVR and hypotensive, requiring emergent cardioversion in ED   Peripheral neuropathy 07/17/2017   Vitamin D  deficiency     Assessment & Plan:   HPI/Patient Profile:   89 y.o. male  with past medical history of HLD, HTN, osteoarthritis, paroxysmal atrial fibrillation on eliquis  admitted on 04/17/2024  with SDH, SAH, and right femur fracture 2/2 fall from balcony. CT head 04/17/2024 w/o contrast demonstrated left posterior SDH up to 9 mm thickness, multifocal bilateral SAH, linear skull fracture of left parietooccipital bone, right posterior scalp hematoma. Repeat CT head on 04/18/2024 demonstrate increase in multifocal hemorrhage, now bilateral SDH, increased SAH volume, minimal midline shift has increased. Per neurosurgery note by Darnella MD on 04/17/2024 patient not a candidate for hematoma evacuation surgery if his hemorrhages were to expand.    Palliative medicine consulted for goals of care conversation.   SUMMARY OF RECOMMENDATIONS Full code, full scope Palliative medicine team will continue to follow for outcomes and further goals of care conversation Healthcare power of attorney in order Tom Norton, Tom Norton, Tom Norton)  Symptom Management:  Per primary team  Code Status: Full Code  Prognosis: Unable to determine  Discharge Planning: To Be Determined  Discussed with: Polly MD 04/25/2024 about adjusting analgesic to address the patient's discomfort while maximizing the patients ability to remain interactive.   Subjective:   Subjective: Chart Reviewed. Updates received. Patient Assessed. Created space and opportunity for patient  and family to explore thoughts and feelings regarding current medical situation.  Today's Discussion:  Met with the patient at the bedside with his wife and son present. Reviewed that overall patient is doing better from pressor requirement but overall there are still uncertainties about the future. Reviewed that overall big picture is still grim, but the patient remains critically ill and that the medical team and family should continue to take it one day at a time. Family shares understanding but they remain hopeful that he will continue to recover with their goal of inpatient rehab. Family is aware that the journey is still long and are hopeful that today they can at least perform more scans to assess the patient's head and spine which will help clarify further options going forward. Wife continues to share immense guilt, sadness, trauma over the events as she asked him to clean the deck and she was the one who heard him fall and found him after falling.   Son inquired about the patient's eyes and any medications we can address for it being persistently opened. Discussed that there are some eye  drops that we can order. Also discussed pain management with the patient and will plan for scheduled oxycodone .   Review of Systems  Unable to perform ROS  Objective:   Primary Diagnoses: Present on Admission:  Critical polytrauma  Vent Mode: PRVC FiO2 (%):  [50 %] 50 % Set Rate:  [20 bmp] 20 bmp Vt Set:  [560 mL] 560 mL PEEP:  [8  cmH20] 8 cmH20 Plateau Pressure:  [10 cmH20-15 cmH20] 10 cmH20  Vital Signs:  BP (!) 86/75   Pulse (!) 107   Temp 98.2 F (36.8 C) (Axillary)   Resp (!) 29   Ht 5' 11 (1.803 m)   Wt 113.2 kg   SpO2 98%   BMI 34.81 kg/m   Physical Exam Constitutional:      Appearance: He is ill-appearing.  HENT:     Head: Normocephalic.     Nose: Nose normal.     Mouth/Throat:     Mouth: Mucous membranes are dry.     Comments: ETT in place Eyes:     Extraocular Movements: Extraocular movements intact.  Cardiovascular:     Rate and Rhythm: Tachycardia present.  Pulmonary:     Comments: Asynchronous on full support ventilation Skin:    General: Skin is warm.  Neurological:     Mental Status: He is alert.    Palliative Assessment/Data: 30%   Existing Vynca/ACP Documentation: AD and HCPOA documentation in EMR  Thank you for allowing us  to participate in the care of Tom Norton PMT will continue to support holistically.  I personally spent a total of 35 minutes in the care of the patient today including preparing to see the patient, getting/reviewing separately obtained history, performing a medically appropriate exam/evaluation, placing orders, referring and communicating with other health care professionals, and documenting clinical information in the EHR.   Taner Rzepka E Wenda Vanschaick, PA-C  Palliative Medicine Team  Team Phone # [phone] (Nights/Weekends) 04/25/2024 1:51 PM  "

## 2024-04-26 DIAGNOSIS — D62 Acute posthemorrhagic anemia: Secondary | ICD-10-CM

## 2024-04-26 DIAGNOSIS — J150 Pneumonia due to Klebsiella pneumoniae: Secondary | ICD-10-CM

## 2024-04-26 DIAGNOSIS — J9600 Acute respiratory failure, unspecified whether with hypoxia or hypercapnia: Secondary | ICD-10-CM | POA: Diagnosis not present

## 2024-04-26 DIAGNOSIS — T07XXXA Unspecified multiple injuries, initial encounter: Secondary | ICD-10-CM | POA: Diagnosis not present

## 2024-04-26 DIAGNOSIS — I4891 Unspecified atrial fibrillation: Secondary | ICD-10-CM | POA: Diagnosis not present

## 2024-04-26 LAB — BASIC METABOLIC PANEL WITH GFR
Anion gap: 10 (ref 5–15)
Anion gap: 11 (ref 5–15)
BUN: 69 mg/dL — ABNORMAL HIGH (ref 8–23)
BUN: 73 mg/dL — ABNORMAL HIGH (ref 8–23)
CO2: 22 mmol/L (ref 22–32)
CO2: 22 mmol/L (ref 22–32)
Calcium: 8 mg/dL — ABNORMAL LOW (ref 8.9–10.3)
Calcium: 7.9 mg/dL — ABNORMAL LOW (ref 8.9–10.3)
Chloride: 111 mmol/L (ref 98–111)
Chloride: 111 mmol/L (ref 98–111)
Creatinine, Ser: 1.61 mg/dL — ABNORMAL HIGH (ref 0.61–1.24)
Creatinine, Ser: 1.63 mg/dL — ABNORMAL HIGH (ref 0.61–1.24)
GFR, Estimated: 40 mL/min — ABNORMAL LOW
GFR, Estimated: 39 mL/min — ABNORMAL LOW
Glucose, Bld: 202 mg/dL — ABNORMAL HIGH (ref 70–99)
Glucose, Bld: 176 mg/dL — ABNORMAL HIGH (ref 70–99)
Potassium: 3.8 mmol/L (ref 3.5–5.1)
Potassium: 3.9 mmol/L (ref 3.5–5.1)
Sodium: 143 mmol/L (ref 135–145)
Sodium: 144 mmol/L (ref 135–145)

## 2024-04-26 LAB — CBC WITH DIFFERENTIAL/PLATELET
Abs Immature Granulocytes: 0.46 K/uL — ABNORMAL HIGH (ref 0.00–0.07)
Basophils Absolute: 0 K/uL (ref 0.0–0.1)
Basophils Relative: 0 %
Eosinophils Absolute: 0 K/uL (ref 0.0–0.5)
Eosinophils Relative: 0 %
HCT: 24.8 % — ABNORMAL LOW (ref 39.0–52.0)
Hemoglobin: 8.2 g/dL — ABNORMAL LOW (ref 13.0–17.0)
Immature Granulocytes: 3 %
Lymphocytes Relative: 7 %
Lymphs Abs: 1.2 K/uL (ref 0.7–4.0)
MCH: 31.5 pg (ref 26.0–34.0)
MCHC: 33.1 g/dL (ref 30.0–36.0)
MCV: 95.4 fL (ref 80.0–100.0)
Monocytes Absolute: 1.3 K/uL — ABNORMAL HIGH (ref 0.1–1.0)
Monocytes Relative: 8 %
Neutro Abs: 13.5 K/uL — ABNORMAL HIGH (ref 1.7–7.7)
Neutrophils Relative %: 82 %
Platelets: 113 K/uL — ABNORMAL LOW (ref 150–400)
RBC: 2.6 MIL/uL — ABNORMAL LOW (ref 4.22–5.81)
RDW: 18.9 % — ABNORMAL HIGH (ref 11.5–15.5)
WBC: 16.5 K/uL — ABNORMAL HIGH (ref 4.0–10.5)
nRBC: 7.9 % — ABNORMAL HIGH (ref 0.0–0.2)

## 2024-04-26 LAB — RENAL FUNCTION PANEL
Albumin: 3.1 g/dL — ABNORMAL LOW (ref 3.5–5.0)
Anion gap: 10 (ref 5–15)
BUN: 67 mg/dL — ABNORMAL HIGH (ref 8–23)
CO2: 22 mmol/L (ref 22–32)
Calcium: 8.4 mg/dL — ABNORMAL LOW (ref 8.9–10.3)
Chloride: 113 mmol/L — ABNORMAL HIGH (ref 98–111)
Creatinine, Ser: 1.48 mg/dL — ABNORMAL HIGH (ref 0.61–1.24)
GFR, Estimated: 44 mL/min — ABNORMAL LOW
Glucose, Bld: 193 mg/dL — ABNORMAL HIGH (ref 70–99)
Phosphorus: 3.2 mg/dL (ref 2.5–4.6)
Potassium: 3.6 mmol/L (ref 3.5–5.1)
Sodium: 146 mmol/L — ABNORMAL HIGH (ref 135–145)

## 2024-04-26 LAB — CBC
HCT: 22.4 % — ABNORMAL LOW (ref 39.0–52.0)
HCT: 21.1 % — ABNORMAL LOW (ref 39.0–52.0)
Hemoglobin: 7.4 g/dL — ABNORMAL LOW (ref 13.0–17.0)
Hemoglobin: 7 g/dL — ABNORMAL LOW (ref 13.0–17.0)
MCH: 32 pg (ref 26.0–34.0)
MCH: 32.4 pg (ref 26.0–34.0)
MCHC: 33 g/dL (ref 30.0–36.0)
MCHC: 33.2 g/dL (ref 30.0–36.0)
MCV: 97 fL (ref 80.0–100.0)
MCV: 97.7 fL (ref 80.0–100.0)
Platelets: 108 K/uL — ABNORMAL LOW (ref 150–400)
Platelets: 132 K/uL — ABNORMAL LOW (ref 150–400)
RBC: 2.31 MIL/uL — ABNORMAL LOW (ref 4.22–5.81)
RBC: 2.16 MIL/uL — ABNORMAL LOW (ref 4.22–5.81)
RDW: 18.2 % — ABNORMAL HIGH (ref 11.5–15.5)
RDW: 18.6 % — ABNORMAL HIGH (ref 11.5–15.5)
WBC: 12.9 K/uL — ABNORMAL HIGH (ref 4.0–10.5)
WBC: 12.6 K/uL — ABNORMAL HIGH (ref 4.0–10.5)
nRBC: 10.3 % — ABNORMAL HIGH (ref 0.0–0.2)
nRBC: 9 % — ABNORMAL HIGH (ref 0.0–0.2)

## 2024-04-26 LAB — GLUCOSE, CAPILLARY
Glucose-Capillary: 100 mg/dL — ABNORMAL HIGH (ref 70–99)
Glucose-Capillary: 145 mg/dL — ABNORMAL HIGH (ref 70–99)
Glucose-Capillary: 161 mg/dL — ABNORMAL HIGH (ref 70–99)
Glucose-Capillary: 181 mg/dL — ABNORMAL HIGH (ref 70–99)
Glucose-Capillary: 182 mg/dL — ABNORMAL HIGH (ref 70–99)
Glucose-Capillary: 182 mg/dL — ABNORMAL HIGH (ref 70–99)

## 2024-04-26 LAB — TRIGLYCERIDES: Triglycerides: 142 mg/dL

## 2024-04-26 LAB — MAGNESIUM: Magnesium: 2.2 mg/dL (ref 1.7–2.4)

## 2024-04-26 LAB — PREPARE RBC (CROSSMATCH)

## 2024-04-26 LAB — PHOSPHORUS: Phosphorus: 3.2 mg/dL (ref 2.5–4.6)

## 2024-04-26 MED ORDER — FUROSEMIDE 10 MG/ML IJ SOLN
120.0000 mg | Freq: Once | INTRAVENOUS | Status: AC
  Administered 2024-04-27: 120 mg via INTRAVENOUS
  Filled 2024-04-26: qty 10

## 2024-04-26 MED ORDER — FUROSEMIDE 10 MG/ML IJ SOLN
15.0000 mg/h | INTRAVENOUS | Status: DC
  Administered 2024-04-26: 10 mg/h via INTRAVENOUS
  Filled 2024-04-26: qty 20
  Filled 2024-04-26: qty 200

## 2024-04-26 MED ORDER — METOLAZONE 5 MG PO TABS
5.0000 mg | ORAL_TABLET | Freq: Once | ORAL | Status: AC
  Administered 2024-04-27: 5 mg
  Filled 2024-04-26: qty 1

## 2024-04-26 MED ORDER — POTASSIUM CHLORIDE 10 MEQ/100ML IV SOLN
10.0000 meq | INTRAVENOUS | Status: AC
  Administered 2024-04-26 (×6): 10 meq via INTRAVENOUS
  Filled 2024-04-26 (×6): qty 100

## 2024-04-26 MED ORDER — AMIODARONE LOAD VIA INFUSION
150.0000 mg | Freq: Once | INTRAVENOUS | Status: AC
  Administered 2024-04-26: 150 mg via INTRAVENOUS

## 2024-04-26 MED ORDER — METOLAZONE 5 MG PO TABS
5.0000 mg | ORAL_TABLET | Freq: Once | ORAL | Status: AC
  Administered 2024-04-26: 5 mg
  Filled 2024-04-26: qty 1

## 2024-04-26 MED ORDER — FUROSEMIDE 10 MG/ML IJ SOLN
20.0000 mg/h | INTRAVENOUS | Status: DC
  Administered 2024-04-26: 16 mg/h via INTRAVENOUS
  Administered 2024-04-27 – 2024-04-28 (×3): 20 mg/h via INTRAVENOUS
  Filled 2024-04-26 (×2): qty 20
  Filled 2024-04-26: qty 200
  Filled 2024-04-26 (×2): qty 20

## 2024-04-26 MED ORDER — MIDODRINE HCL 5 MG PO TABS
10.0000 mg | ORAL_TABLET | Freq: Three times a day (TID) | ORAL | Status: DC
  Administered 2024-04-26 – 2024-05-02 (×21): 10 mg
  Filled 2024-04-26 (×21): qty 2

## 2024-04-26 MED ORDER — MAGNESIUM SULFATE 4 GM/100ML IV SOLN: 4.0000 g | Freq: Once | INTRAVENOUS | Status: DC

## 2024-04-26 MED ORDER — SODIUM CHLORIDE 0.9% IV SOLUTION: Freq: Once | INTRAVENOUS | Status: AC

## 2024-04-26 MED ORDER — DEXMEDETOMIDINE HCL IN NACL 400 MCG/100ML IV SOLN
0.0000 ug/kg/h | INTRAVENOUS | Status: DC
  Administered 2024-04-26: 0.8 ug/kg/h via INTRAVENOUS
  Administered 2024-04-26: 1 ug/kg/h via INTRAVENOUS
  Administered 2024-04-26: 0.9 ug/kg/h via INTRAVENOUS
  Administered 2024-04-26: 1 ug/kg/h via INTRAVENOUS
  Administered 2024-04-26: 0.4 ug/kg/h via INTRAVENOUS
  Administered 2024-04-27 (×5): 1 ug/kg/h via INTRAVENOUS
  Administered 2024-04-27: 0.9 ug/kg/h via INTRAVENOUS
  Administered 2024-04-27: 1 ug/kg/h via INTRAVENOUS
  Administered 2024-04-28: 0.9 ug/kg/h via INTRAVENOUS
  Administered 2024-04-28 (×2): 0.7 ug/kg/h via INTRAVENOUS
  Administered 2024-04-28: 1 ug/kg/h via INTRAVENOUS
  Administered 2024-04-28 (×2): 0.8 ug/kg/h via INTRAVENOUS
  Administered 2024-04-29: 0.7 ug/kg/h via INTRAVENOUS
  Administered 2024-04-29 (×2): 1.2 ug/kg/h via INTRAVENOUS
  Administered 2024-04-29: 0.7 ug/kg/h via INTRAVENOUS
  Administered 2024-04-29 – 2024-04-30 (×4): 1.2 ug/kg/h via INTRAVENOUS
  Administered 2024-04-30 (×2): 0.4 ug/kg/h via INTRAVENOUS
  Administered 2024-04-30: 1.2 ug/kg/h via INTRAVENOUS
  Administered 2024-05-01: 0.4 ug/kg/h via INTRAVENOUS
  Filled 2024-04-26 (×3): qty 100
  Filled 2024-04-26: qty 200
  Filled 2024-04-26 (×5): qty 100
  Filled 2024-04-26: qty 200
  Filled 2024-04-26 (×3): qty 100
  Filled 2024-04-26: qty 200
  Filled 2024-04-26 (×3): qty 100
  Filled 2024-04-26 (×2): qty 200
  Filled 2024-04-26 (×6): qty 100

## 2024-04-26 MED ORDER — FUROSEMIDE 10 MG/ML IJ SOLN
80.0000 mg | Freq: Once | INTRAMUSCULAR | Status: AC
  Administered 2024-04-26: 80 mg via INTRAVENOUS
  Filled 2024-04-26: qty 8

## 2024-04-26 MED ORDER — POTASSIUM CHLORIDE 20 MEQ PO PACK
40.0000 meq | PACK | Freq: Once | ORAL | Status: AC
  Administered 2024-04-26: 40 meq
  Filled 2024-04-26: qty 2

## 2024-04-26 NOTE — Progress Notes (Signed)
 Patient ID: Tom Norton, male   DOB: 23-Nov-1929, 89 y.o.   MRN: [id number] Follow up - Trauma Critical Care   Patient Details:    Tom Norton is an 89 y.o. male.  Lines/tubes : Airway 7.5 mm (Active)  Secured at (cm) 26 cm 04/26/24 0735  Measured From Lips 04/26/24 0735  Secured Location Right 04/26/24 0735  Secured By [employer] 04/26/24 0735  Bite Block Yes 04/26/24 0735  Tube Holder Repositioned Yes 04/26/24 0735  Prone position No 04/26/24 0735  Cuff Pressure (cm H2O) Clear OR 27-39 CmH2O 04/26/24 0735  Site Condition Cool;Dry 04/26/24 0735     PICC Triple Lumen 04/17/24 Right Brachial 40 cm 0 cm (Active)  Indication for Insertion or Continuance of Line Vasoactive infusions 04/25/24 2000  Exposed Catheter (cm) 0 cm 04/17/24 2326  Site Assessment Clean, Dry, Intact 04/25/24 2000  Lumen #1 Status Infusing;Flushed 04/25/24 2000  Lumen #2 Status Infusing;Flushed 04/25/24 2000  Lumen #3 Status Infusing;In-line blood sampling system in place 04/25/24 2000  Dressing Type Transparent 04/25/24 2000  Dressing Status Antimicrobial disc/dressing in place 04/25/24 2000  Line Care Connections checked and tightened 04/24/24 2000  Line Adjustment (NICU/IV Team Only) No 04/21/24 0815  Dressing Intervention Dressing changed 04/25/24 0600  Dressing Change Due 05/02/24 04/25/24 2000     Arterial Line 04/23/24 Left Radial (Active)  Site Assessment Clean, Dry, Intact 04/25/24 2000  Line Status Positional 04/25/24 2000  Art Line Waveform Appropriate 04/25/24 2000  Art Line Interventions Zeroed and calibrated;Leveled;Connections checked and tightened 04/24/24 2000  Color/Movement/Sensation Capillary refill less than 3 sec 04/24/24 2000  Dressing Type Transparent 04/25/24 2000  Dressing Status Clean, Dry, Intact 04/25/24 2000  Dressing Change Due 04/30/24 04/25/24 2000     Negative Pressure Wound Therapy Knee Anterior;Left (Active)     Urethral Catheter [name], RN Latex 16 Fr. (Active)  Indication for Insertion or Continuance of Catheter Therapy based on hourly urine output monitoring and documentation for critical condition (NOT STRICT I&O) 04/25/24 2000  Site Assessment Clean, Dry, Intact 04/25/24 2000  Catheter Maintenance Bag below level of bladder;Catheter secured;Drainage bag/tubing not touching floor 04/25/24 2000  Collection Container Standard drainage bag 04/25/24 2000  Securement Method Adhesive securement device 04/25/24 2000  Urinary Catheter Interventions (if applicable) Unclamped 04/25/24 2000  Output (mL) 110 mL 04/26/24 0700    Microbiology/Sepsis markers: Results for orders placed or performed during the hospital encounter of 04/17/24  MRSA Next Gen by PCR, Nasal     Status: None   Collection Time: 04/17/24  8:06 PM   Specimen: Nasal Mucosa; Nasal Swab  Result Value Ref Range Status   MRSA by PCR Next Gen NOT DETECTED NOT DETECTED Final    Comment: (NOTE) The GeneXpert MRSA Assay (FDA approved for NASAL specimens only), is one component of a comprehensive MRSA colonization surveillance program. It is not intended to diagnose MRSA infection nor to guide or monitor treatment for MRSA infections. Test performance is not FDA approved in patients less than 73 years old. Performed at [facility], [address], [place], [place] [address]   Culture, Respiratory w Gram Stain     Status: None   Collection Time: 04/22/24  1:08 PM   Specimen: Tracheal Aspirate; Respiratory  Result Value Ref Range Status   Specimen Description TRACHEAL ASPIRATE  Final   Special Requests NONE  Final   Gram Stain   Final    FEW WBC PRESENT,BOTH PMN AND MONONUCLEAR MODERATE GRAM  NEGATIVE RODS FEW GRAM POSITIVE COCCI RARE GRAM POSITIVE RODS Performed at [facility], [address] [place], [place] [address]    Culture MODERATE KLEBSIELLA AEROGENES  Final   Report Status 04/24/2024 FINAL  Final   Organism ID, Bacteria  KLEBSIELLA AEROGENES  Final      Susceptibility   Klebsiella aerogenes - MIC*    CEFEPIME  <=0.12 SENSITIVE Sensitive     CEFTRIAXONE 32 RESISTANT Resistant     CIPROFLOXACIN <=0.06 SENSITIVE Sensitive     GENTAMICIN <=1 SENSITIVE Sensitive     MEROPENEM  <=0.25 SENSITIVE Sensitive     TRIMETH/SULFA <=20 SENSITIVE Sensitive     PIP/TAZO Value in next row Intermediate      64 INTERMEDIATEThis is a modified FDA-approved test that has been validated and its performance characteristics determined by the reporting laboratory.  This laboratory is certified under the Clinical Laboratory Improvement Amendments CLIA as qualified to perform high complexity clinical laboratory testing.    * MODERATE KLEBSIELLA AEROGENES    Anti-infectives:  Anti-infectives (From admission, onward)    Start     Dose/Rate Route Frequency Ordered Stop   04/22/24 1115  ceFEPIme  (MAXIPIME ) 2 g in sodium chloride  0.9 % 100 mL IVPB        2 g 200 mL/hr over 30 Minutes Intravenous Every 12 hours 04/22/24 1024 04/28/24 2359   04/20/24 2045  ceFAZolin  (ANCEF ) IVPB 2g/100 mL premix       Note to Pharmacy: Anesthesia to give preop   2 g 200 mL/hr over 30 Minutes Intravenous  Once 04/20/24 1948 04/20/24 2053      Consults: Treatment Team:  Darnella Dorn SAUNDERS, MD    Studies:    Events:  Subjective:    Overnight Issues:   Objective:  Vital signs for last 24 hours: Temp:  [97.7 F (36.5 C)-98.2 F (36.8 C)] 98.2 F (36.8 C) (01/03 0800) Pulse Rate:  [70-141] 97 (01/03 0735) Resp:  [0-29] 20 (01/03 0735) BP: (49-117)/(40-85) 107/74 (01/03 0700) SpO2:  [88 %-100 %] 100 % (01/03 0735) Arterial Line BP: (82-210)/(43-191) 126/66 (01/03 0700) FiO2 (%):  [50 %] 50 % (01/03 0735) Weight:  [117.3 kg] 117.3 kg (01/03 0500)  Hemodynamic parameters for last 24 hours:    Intake/Output from previous day: 01/02 0701 - 01/03 0700 In: 3961.2 [I.V.:1339.5; NG/GT:2421.7; IV Piggyback:200] Out: 2060  [Urine:2060]  Intake/Output this shift: No intake/output data recorded.  Vent settings for last 24 hours: Vent Mode: PRVC FiO2 (%):  [50 %] 50 % Set Rate:  [20 bmp] 20 bmp Vt Set:  [560 mL] 560 mL PEEP:  [8 cmH20] 8 cmH20 Plateau Pressure:  [10 cmH20-21 cmH20] 20 cmH20  Physical Exam:  General: on vent Neuro: sedated, did not F/C but decreasing propofol  now Resp: some rhonchi, +secretions CVS: IRR GI: soft, NT Extremities: edema BLE with weeping blisters RLE  Results for orders placed or performed during the hospital encounter of 04/17/24 (from the past 24 hours)  Glucose, capillary     Status: Abnormal   Collection Time: 04/25/24 11:48 AM  Result Value Ref Range   Glucose-Capillary 157 (H) 70 - 99 mg/dL  Glucose, capillary     Status: Abnormal   Collection Time: 04/25/24  4:08 PM  Result Value Ref Range   Glucose-Capillary 157 (H) 70 - 99 mg/dL  Basic metabolic panel with GFR     Status: Abnormal   Collection Time: 04/25/24  4:16 PM  Result Value Ref Range   Sodium 148 (H)  135 - 145 mmol/L   Potassium 3.6 3.5 - 5.1 mmol/L   Chloride 115 (H) 98 - 111 mmol/L   CO2 20 (L) 22 - 32 mmol/L   Glucose, Bld 180 (H) 70 - 99 mg/dL   BUN 60 (H) 8 - 23 mg/dL   Creatinine, Ser 8.43 (H) 0.61 - 1.24 mg/dL   Calcium  8.4 (L) 8.9 - 10.3 mg/dL   GFR, Estimated 41 (L) >60 mL/min   Anion gap 12 5 - 15  Glucose, capillary     Status: Abnormal   Collection Time: 04/25/24  7:39 PM  Result Value Ref Range   Glucose-Capillary 212 (H) 70 - 99 mg/dL  Glucose, capillary     Status: Abnormal   Collection Time: 04/25/24 11:23 PM  Result Value Ref Range   Glucose-Capillary 216 (H) 70 - 99 mg/dL  Glucose, capillary     Status: Abnormal   Collection Time: 04/26/24  3:25 AM  Result Value Ref Range   Glucose-Capillary 181 (H) 70 - 99 mg/dL  Phosphorus     Status: None   Collection Time: 04/26/24  5:36 AM  Result Value Ref Range   Phosphorus 3.2 2.5 - 4.6 mg/dL  CBC     Status: Abnormal    Collection Time: 04/26/24  5:36 AM  Result Value Ref Range   WBC 12.9 (H) 4.0 - 10.5 K/uL   RBC 2.31 (L) 4.22 - 5.81 MIL/uL   Hemoglobin 7.4 (L) 13.0 - 17.0 g/dL   HCT 77.5 (L) 60.9 - 47.9 %   MCV 97.0 80.0 - 100.0 fL   MCH 32.0 26.0 - 34.0 pg   MCHC 33.0 30.0 - 36.0 g/dL   RDW 81.7 (H) 88.4 - 84.4 %   Platelets 108 (L) 150 - 400 K/uL   nRBC 10.3 (H) 0.0 - 0.2 %  Renal function panel     Status: Abnormal   Collection Time: 04/26/24  5:36 AM  Result Value Ref Range   Sodium 146 (H) 135 - 145 mmol/L   Potassium 3.6 3.5 - 5.1 mmol/L   Chloride 113 (H) 98 - 111 mmol/L   CO2 22 22 - 32 mmol/L   Glucose, Bld 193 (H) 70 - 99 mg/dL   BUN 67 (H) 8 - 23 mg/dL   Creatinine, Ser 8.51 (H) 0.61 - 1.24 mg/dL   Calcium  8.4 (L) 8.9 - 10.3 mg/dL   Phosphorus 3.2 2.5 - 4.6 mg/dL   Albumin  3.1 (L) 3.5 - 5.0 g/dL   GFR, Estimated 44 (L) >60 mL/min   Anion gap 10 5 - 15  Triglycerides     Status: None   Collection Time: 04/26/24  5:36 AM  Result Value Ref Range   Triglycerides 142 <150 mg/dL  Magnesium      Status: None   Collection Time: 04/26/24  5:36 AM  Result Value Ref Range   Magnesium  2.2 1.7 - 2.4 mg/dL  Glucose, capillary     Status: Abnormal   Collection Time: 04/26/24  8:05 AM  Result Value Ref Range   Glucose-Capillary 145 (H) 70 - 99 mg/dL    Assessment & Plan: Present on Admission:  Critical polytrauma    LOS: 9 days   Additional comments:labs and micro rewiewed 89 y.o. male s/p fall   Known Injuries: - Right femur fracture - SDH/SAH - Nondepressed parietoocipital bone fracture - Trace left basilar pneumothorax - Bilateral rib fractures - Thoracic TP fractures - Possible T9-10 discoligamentous injury - Left psoas hemorrhage without extravasation  Plan: -  Appreciate neurosurgery recommendations, Dr. Garst              - MRI thoracic spine done 1/2 - NS plan pending              - Repeat [facility] 12/28 with stable ICH, but new occipital stroke, Dr. Merrianne consulted.  MRI brain also done 1/2 - q1h neuro check - CTA Neck pending to evaluate for embolic source - Appreciate orthopaedics recommendations, Dr. Jerri, Dr. Kendal - ORIF right femur 12/29 (Dr. Jerri)  - Acute blood loss anemia   - Shock - likely hemorrhagic, cardiogenic (CHF, afib s/p cardioversion and now amio), appreciate CCM management and I D/W Dr. Claudene on the unit today - lasix  challenge, metolazone   - ID - cefepime  for klebsiella PNA  - FEN - strict NPO, cortrak  - DVT - SCDs, holding chemical ppx due to bleeding concerns  - Dispo - ICU, clinical update provided to family at bedside this AM.   Critical Care Total Time*: 33 Minutes  Dann Hummer, MD, MPH, FACS Trauma & General Surgery Use [email or web address] to contact on call provider  04/26/2024  *Care during the described time interval was provided by me. I have reviewed this patient's available data, including medical history, events of note, physical examination and test results as part of my evaluation.

## 2024-04-26 NOTE — Progress Notes (Signed)
 After discussion with Dr. Claudene (with the CCM team), Ignacia boots are to be applied. However, the patient has extensive blisters on his right lower extremity, and WOC orders were placed (see documented wounds and media tab). Unfortunately, there is no WOC nurse on the weekends. RN notified Dr. Claudene.  Discussed with the ortho tech the plan of care, and that the left lower extremity appears appropriate for application. Will hold off on applying Una boot to right extremity until further assessed by WOC.  Aashi Derrington, RN

## 2024-04-26 NOTE — Progress Notes (Signed)
 Patient ID: Tom Norton, male   DOB: 04-Jul-1929, 89 y.o.   MRN: [id number] He remains intubated and sedated on fentanyl  and vasopressin  and Levophed .  No exam is available.  We continue to follow.

## 2024-04-26 NOTE — Progress Notes (Signed)
 SLP Cancellation Note  Patient Details Name: Tom Norton MRN: [id number] DOB: 1930/02/23   Cancelled treatment:       Reason Eval/Treat Not Completed: Patient not medically ready remains on ventilator. Will f/u   Sharanda Shinault, Consuelo Fitch 04/26/2024, 8:06 AM

## 2024-04-26 NOTE — Progress Notes (Signed)
 Orthopedic Tech Progress Note Patient Details:  Tom Norton [birth year] [id number] Applied unna boots to LLE. Waiting on assessment from WOC to apply to RLE.  Ortho Devices Type of Ortho Device: Radio broadcast assistant Ortho Device/Splint Location: LLE Ortho Device/Splint Interventions: Ordered, Application, Adjustment   Post Interventions Patient Tolerated: Well Instructions Provided: Adjustment of device, Care of device, Poper ambulation with device  Morna Pink 04/26/2024, 12:39 PM

## 2024-04-26 NOTE — Progress Notes (Addendum)
 "  NAME:  Tom Norton, MRN:  [id number], DOB:  1930-01-29, LOS: 9 ADMISSION DATE:  04/17/2024, CONSULTATION DATE:  04/26/2024 REFERRING MD:  Trauma, CHIEF COMPLAINT:  shock   History of Present Illness:  89 year old man admitted as polytrauma after fall from deck with multiple orthopedic injuries, fracture, whom we are consulted for hypoxemia and shock.  Patient was admitted Christmas Day after fall from deck per report.  Initial images reveal left-sided rib fractures, right femur fracture, possible iliac fractures, left posterior subdural hematoma no midline shift, small left posterior parafalcine subdural hematoma, traumatic subarachnoid hemorrhage, skull fracture, trace left apical pneumothorax.  Chest x-ray 12/28 demonstrated left-sided atelectasis and small pleural effusion on my review interpretation.  He went for femur repair 12/29.  He named and remains intubated after procedure.  Chest x-ray 12/29 reveals ET tube placement, similar right-sided infiltrates and atelectasis, new rounded left-sided infiltrate on my review interpretation.  He developed shock now on maximum dose norepinephrine , vasopressin , started on phenylephrine  overnight.  He remains intubated.  Minimal vent settings.  He is on Precedex  for sedation.  Weaning Precedex .  Blood pressure get a bit better.  Review of labs reveal serial low hemoglobins.  He is getting a transfusion today.  He has had transfusions earlier this admission.  Pertinent  Medical History    Significant Hospital Events: Including procedures, antibiotic start and stop dates in addition to other pertinent events   12/25 admitted after fall 12/29 OR fro femur fracture repair  12/30 PCCM consult, AFIB RVR overnight  Interim History / Subjective:  Remains in RVR, sedated on vent  Objective    Blood pressure 107/70, pulse (!) 110, temperature 97.8 F (36.6 C), temperature source Axillary, resp. rate 20, height 5' 11 (1.803 m), weight 117.3 kg, SpO2  100%.    Vent Mode: PRVC FiO2 (%):  [50 %] 50 % Set Rate:  [20 bmp] 20 bmp Vt Set:  [560 mL] 560 mL PEEP:  [8 cmH20] 8 cmH20 Plateau Pressure:  [10 cmH20-21 cmH20] 21 cmH20   Intake/Output Summary (Last 24 hours) at 04/26/2024 0714 Last data filed at 04/26/2024 0600 Gross per 24 hour  Intake 3848.61 ml  Output 1950 ml  Net 1898.61 ml   Filed Weights   04/17/24 1700 04/25/24 0500 04/26/24 0500  Weight: 89.4 kg 113.2 kg 117.3 kg    Examination: Numbers above not correct: driving pressure only [id number] Sedated, pupils equal, lungs diminished triggers vent Massively volume overloaded Bedside EF poor windows but EF grossly preserved IVC dilated Small effusions Follows commands per nursing when off sedation  Imaging/labs reviewed     amiodarone  30 mg/hr (04/26/24 0700)   ceFEPime  (MAXIPIME ) IV Stopped (04/26/24 0017)   fentaNYL  infusion INTRAVENOUS 75 mcg/hr (04/26/24 0700)   furosemide  (LASIX ) 200 mg in dextrose  5 % 100 mL (2 mg/mL) infusion     norepinephrine  (LEVOPHED ) Adult infusion 8 mcg/min (04/26/24 0700)   potassium chloride      propofol  (DIPRIVAN ) infusion 25 mcg/kg/min (04/26/24 0700)   vasopressin  0.03 Units/min (04/26/24 0700)   Patient Lines/Drains/Airways Status     Active Line/Drains/Airways     Name Placement date Placement time Site Days   Arterial Line 04/23/24 Left Radial 04/23/24  0240  Radial  3   Peripheral IV 04/17/24 20 G Anterior;Distal;Left Forearm 04/17/24  1849  Forearm  9   PICC Triple Lumen 04/17/24 Right Brachial 40 cm 0 cm 04/17/24  2320  -- 9   Negative Pressure Wound Therapy Knee Anterior;Left 02/18/21  1708  --  1163   Urethral Catheter [name], RN Latex 16 Fr. 04/18/24  0000  Latex  8   Airway 7.5 mm 04/21/24  1211  -- 5   Small Bore Feeding Tube 10 Fr. Left nare Marking at nare/corner of mouth 61 cm 04/18/24  1610  Left nare  8   Wound 04/21/24 1333 Surgical Closed Surgical Incision Hip Right 04/21/24  1333  Hip  5            Assessment and Plan   Polytrauma (TBI, rib fx, spinal fx, psoas hematoma, R femur fx post ORIF) T9/10 ligamentous injury r/o with MRI fortunately Type 1 resp failure related now mostly to massive volume overload with diuresis limited by hemodynamics Ongoing vasoplegia Klebsiella HCAP on cefepime  x 7 days Ongoing ABLA query phlebotomy and critical illness Afib/RVR relatedd to atrial stretch  Check Mg Add midodrine  Rebolus amio Wrap legs CVP check: mid 20s, lasix  + metolazone  + aggressive K repletion Keep vent, need more reasonable volume status before we can attempt extubation; moreover PEEP will help w/ diuresis Try to wean from prop to precedex  to help with BP Will follow Wife updated at bedside Remainder of polytrauma per trauma team  33 min cc time Rolan Sharps MD PCCM "

## 2024-04-26 NOTE — Progress Notes (Signed)
 VENTILATOR WEAN NOTE 04/26/2024  Start Mode: PRVC  Wean Mode:   Duration before failure:   Reason for failure: Other:    Notes: no weaning at this time due to patient's need for further diuresis.

## 2024-04-26 NOTE — Consult Note (Signed)
 WOC Nurse Consult Note: Reason for Consult: extensive blister; can we apply Unna's boots? Wound type: intact blistering, edema related  Pressure Injury POA: NA Measurement: see nursing flow sheet Wound azi:dzmnld filled blisters Drainage (amount, consistency, odor) see nursing flow sheets Periwound: edema  Dressing procedure/placement/frequency: Apply single layer of xeroform to blistered areas, top with foam Requested bedside nursing discuss Unna's boot application with CCM, as they have ordered the Unna's boot on the other LEs.    Re consult if needed, will not follow at this time. Thanks  Christia [employer], RN,CWOCN, CNS, [employer] [phone]

## 2024-04-27 DIAGNOSIS — T07XXXA Unspecified multiple injuries, initial encounter: Secondary | ICD-10-CM | POA: Diagnosis not present

## 2024-04-27 DIAGNOSIS — J9600 Acute respiratory failure, unspecified whether with hypoxia or hypercapnia: Secondary | ICD-10-CM | POA: Diagnosis not present

## 2024-04-27 DIAGNOSIS — J150 Pneumonia due to Klebsiella pneumoniae: Secondary | ICD-10-CM | POA: Diagnosis not present

## 2024-04-27 DIAGNOSIS — D62 Acute posthemorrhagic anemia: Secondary | ICD-10-CM | POA: Diagnosis not present

## 2024-04-27 DIAGNOSIS — I4891 Unspecified atrial fibrillation: Secondary | ICD-10-CM | POA: Diagnosis not present

## 2024-04-27 LAB — POCT I-STAT 7, (LYTES, BLD GAS, ICA,H+H)
Acid-Base Excess: 0 mmol/L (ref 0.0–2.0)
Bicarbonate: 27.5 mmol/L (ref 20.0–28.0)
Calcium, Ion: 1.2 mmol/L (ref 1.15–1.40)
HCT: 26 % — ABNORMAL LOW (ref 39.0–52.0)
Hemoglobin: 8.8 g/dL — ABNORMAL LOW (ref 13.0–17.0)
O2 Saturation: 96 %
Patient temperature: 99.2
Potassium: 4 mmol/L (ref 3.5–5.1)
Sodium: 143 mmol/L (ref 135–145)
TCO2: 29 mmol/L (ref 22–32)
pCO2 arterial: 57.1 mmHg — ABNORMAL HIGH (ref 32–48)
pH, Arterial: 7.292 — ABNORMAL LOW (ref 7.35–7.45)
pO2, Arterial: 92 mmHg (ref 83–108)

## 2024-04-27 LAB — GLUCOSE, CAPILLARY
Glucose-Capillary: 186 mg/dL — ABNORMAL HIGH (ref 70–99)
Glucose-Capillary: 150 mg/dL — ABNORMAL HIGH (ref 70–99)
Glucose-Capillary: 184 mg/dL — ABNORMAL HIGH (ref 70–99)
Glucose-Capillary: 163 mg/dL — ABNORMAL HIGH (ref 70–99)
Glucose-Capillary: 140 mg/dL — ABNORMAL HIGH (ref 70–99)
Glucose-Capillary: 124 mg/dL — ABNORMAL HIGH (ref 70–99)

## 2024-04-27 LAB — CBC
HCT: 24.3 % — ABNORMAL LOW (ref 39.0–52.0)
Hemoglobin: 7.9 g/dL — ABNORMAL LOW (ref 13.0–17.0)
MCH: 31.2 pg (ref 26.0–34.0)
MCHC: 32.5 g/dL (ref 30.0–36.0)
MCV: 96 fL (ref 80.0–100.0)
Platelets: 115 K/uL — ABNORMAL LOW (ref 150–400)
RBC: 2.53 MIL/uL — ABNORMAL LOW (ref 4.22–5.81)
RDW: 19.6 % — ABNORMAL HIGH (ref 11.5–15.5)
WBC: 14.6 K/uL — ABNORMAL HIGH (ref 4.0–10.5)
nRBC: 6.5 % — ABNORMAL HIGH (ref 0.0–0.2)

## 2024-04-27 LAB — BASIC METABOLIC PANEL WITH GFR
Anion gap: 12 (ref 5–15)
Anion gap: 12 (ref 5–15)
BUN: 82 mg/dL — ABNORMAL HIGH (ref 8–23)
BUN: 89 mg/dL — ABNORMAL HIGH (ref 8–23)
CO2: 24 mmol/L (ref 22–32)
CO2: 24 mmol/L (ref 22–32)
Calcium: 8.2 mg/dL — ABNORMAL LOW (ref 8.9–10.3)
Calcium: 8.5 mg/dL — ABNORMAL LOW (ref 8.9–10.3)
Chloride: 109 mmol/L (ref 98–111)
Chloride: 106 mmol/L (ref 98–111)
Creatinine, Ser: 1.86 mg/dL — ABNORMAL HIGH (ref 0.61–1.24)
Creatinine, Ser: 2.15 mg/dL — ABNORMAL HIGH (ref 0.61–1.24)
GFR, Estimated: 34 mL/min — ABNORMAL LOW
GFR, Estimated: 28 mL/min — ABNORMAL LOW
Glucose, Bld: 175 mg/dL — ABNORMAL HIGH (ref 70–99)
Glucose, Bld: 180 mg/dL — ABNORMAL HIGH (ref 70–99)
Potassium: 3.8 mmol/L (ref 3.5–5.1)
Potassium: 4 mmol/L (ref 3.5–5.1)
Sodium: 145 mmol/L (ref 135–145)
Sodium: 143 mmol/L (ref 135–145)

## 2024-04-27 LAB — PHOSPHORUS: Phosphorus: 2.8 mg/dL (ref 2.5–4.6)

## 2024-04-27 LAB — TYPE AND SCREEN
ABO/RH(D): O NEG
Antibody Screen: NEGATIVE
Unit division: 0
Unit division: 0

## 2024-04-27 LAB — BPAM RBC
Blood Product Expiration Date: 202601292359
Blood Product Expiration Date: 202601312359
ISSUE DATE / TIME: 202601011321
ISSUE DATE / TIME: 202601031602
Unit Type and Rh: 9500
Unit Type and Rh: 9500

## 2024-04-27 LAB — MAGNESIUM: Magnesium: 2.3 mg/dL (ref 1.7–2.4)

## 2024-04-27 MED ORDER — INSULIN GLARGINE-YFGN 100 UNIT/ML ~~LOC~~ SOLN
8.0000 [IU] | Freq: Every day | SUBCUTANEOUS | Status: DC
  Administered 2024-04-28 – 2024-05-02 (×5): 8 [IU] via SUBCUTANEOUS
  Filled 2024-04-27 (×5): qty 0.08

## 2024-04-27 MED ORDER — FUROSEMIDE 10 MG/ML IJ SOLN
120.0000 mg | Freq: Once | INTRAVENOUS | Status: AC
  Administered 2024-04-27: 120 mg via INTRAVENOUS
  Filled 2024-04-27: qty 10

## 2024-04-27 MED ORDER — HYDROMORPHONE HCL 1 MG/ML IJ SOLN
0.5000 mg | INTRAMUSCULAR | Status: DC | PRN
  Administered 2024-05-01: 1 mg via INTRAVENOUS
  Filled 2024-04-27: qty 1

## 2024-04-27 MED ORDER — POTASSIUM CHLORIDE 20 MEQ PO PACK
40.0000 meq | PACK | Freq: Once | ORAL | Status: AC
  Administered 2024-04-27: 40 meq
  Filled 2024-04-27: qty 2

## 2024-04-27 MED ORDER — ALBUMIN HUMAN 25 % IV SOLN
50.0000 g | Freq: Once | INTRAVENOUS | Status: AC
  Administered 2024-04-27: 50 g via INTRAVENOUS
  Filled 2024-04-27: qty 200

## 2024-04-27 MED ORDER — BUMETANIDE 0.25 MG/ML IJ SOLN
2.0000 mg | Freq: Once | INTRAMUSCULAR | Status: AC
  Administered 2024-04-27: 2 mg via INTRAVENOUS
  Filled 2024-04-27: qty 8

## 2024-04-27 MED ORDER — METOLAZONE 5 MG PO TABS
5.0000 mg | ORAL_TABLET | Freq: Once | ORAL | Status: AC
  Administered 2024-04-27: 5 mg
  Filled 2024-04-27: qty 1

## 2024-04-27 MED ORDER — METOLAZONE 5 MG PO TABS
5.0000 mg | ORAL_TABLET | Freq: Two times a day (BID) | ORAL | Status: AC
  Administered 2024-04-27 (×2): 5 mg
  Filled 2024-04-27 (×2): qty 1

## 2024-04-27 MED ORDER — INSULIN GLARGINE 100 UNIT/ML ~~LOC~~ SOLN
8.0000 [IU] | Freq: Every day | SUBCUTANEOUS | Status: DC
  Administered 2024-04-27: 8 [IU] via SUBCUTANEOUS
  Filled 2024-04-27: qty 0.08

## 2024-04-27 NOTE — Progress Notes (Signed)
 When attempting to wean sedation and attempt another PS/CPAP trial today, the patient quickly desaturated to 82 and maintained despite suctioning. RT placed patient back on PRVC, and maintained saturations at 84 on previous settings. Placed patient in full support with 100% FiO2.  Notified care team of event, and verbal orders for an ABG to be collected. See reseults tab. RT at bedside throughout the event.  Adrienne Trombetta, RN

## 2024-04-27 NOTE — Progress Notes (Signed)
 Orthopedic Tech Progress Note Patient Details:  Tom Norton October 20, 1929 [id number]  Unna boots are now on BLE. Wound care was applied to the RLE by Odell, RN. These will need to be removed and reapplied 1/7 or 1/8 at the latest.  Ortho Devices Type of Ortho Device: Radio broadcast assistant Ortho Device/Splint Location: RLE (LLE completed 1/3) Ortho Device/Splint Interventions: Ordered, Application, Adjustment   Post Interventions Patient Tolerated: Fair Instructions Provided: Care of device  [name] [name] [name] 04/27/2024, 2:40 PM

## 2024-04-27 NOTE — Progress Notes (Addendum)
 Patient became very agitated after stopping continuous fentanyl  drip earlier this morning. Patient has previously been on Precedex  at 1. He has been appropriately following commands, and still is, but with agitation and attempting to pull lines around 1245. PRN Dilaudid  given 30 minutes prior after patient reported pain appropriately with a head nod.  RN notified Dr. Claudene of the new-onset agitation. Dr. Claudene came to the bedside and assessed the patient, who then verbal ordered to restart Propofol  at a low dose.   Delia Sitar, RN

## 2024-04-27 NOTE — Progress Notes (Signed)
 Patient ID: Tom Norton, male   DOB: [birth year], 89 y.o.   MRN: [id number] Follow up - Trauma Critical Care   Patient Details:    Tom Norton is an 89 y.o. male.  Lines/tubes : Airway 7.5 mm (Active)  Secured at (cm) 26 cm 04/27/24 0338  Measured From Lips 04/27/24 0338  Secured Location Right 04/27/24 0338  Secured By [employer] 04/27/24 0338  Bite Block Yes 04/26/24 1607  Tube Holder Repositioned Yes 04/27/24 0338  Prone position No 04/26/24 1133  Cuff Pressure (cm H2O) Clear OR 27-39 CmH2O 04/26/24 1905  Site Condition Cool;Dry 04/26/24 1607     PICC Triple Lumen 04/17/24 Right Brachial 40 cm 0 cm (Active)  Indication for Insertion or Continuance of Line Vasoactive infusions;Limited venous access - need for IV therapy >5 days (PICC only) 04/26/24 0800  Exposed Catheter (cm) 0 cm 04/17/24 2326  Site Assessment Clean, Dry, Intact 04/26/24 0800  Lumen #1 Status Infusing 04/26/24 0800  Lumen #2 Status Infusing 04/26/24 0800  Lumen #3 Status Infusing;In-line blood sampling system in place 04/26/24 0800  Dressing Type Transparent 04/26/24 0800  Dressing Status Clean, Dry, Intact;Antimicrobial disc/dressing in place 04/26/24 0800  Line Care Connections checked and tightened;Line pulled back;Zeroed and calibrated 04/26/24 0800  Line Adjustment (NICU/IV Team Only) No 04/21/24 0815  Dressing Intervention Dressing changed 04/25/24 0600  Dressing Change Due 05/02/24 04/26/24 0800     Arterial Line 04/23/24 Left Radial (Active)  Site Assessment Clean, Dry, Intact 04/26/24 0800  Line Status Pulsatile blood flow 04/26/24 0800  Art Line Waveform Appropriate;Square wave test performed 04/26/24 0800  Art Line Interventions Connections checked and tightened 04/26/24 0800  Color/Movement/Sensation Capillary refill greater than 3 sec 04/26/24 0800  Dressing Type Transparent 04/26/24 0800  Dressing Status Clean, Dry, Intact 04/26/24 0800  Dressing Change Due 04/30/24  04/26/24 0800     Negative Pressure Wound Therapy Knee Anterior;Left (Active)     Urethral Catheter [name], RN Latex 16 Fr. (Active)  Indication for Insertion or Continuance of Catheter Therapy based on hourly urine output monitoring and documentation for critical condition (NOT STRICT I&O) 04/26/24 1945  Site Assessment Clean, Dry, Intact 04/26/24 1945  Catheter Maintenance Bag below level of bladder;Catheter secured;No dependent loops;Insertion date on drainage bag 04/26/24 1945  Collection Container Standard drainage bag 04/26/24 1945  Securement Method Adhesive securement device 04/26/24 1945  Urinary Catheter Interventions (if applicable) Unclamped 04/26/24 1945  Output (mL) 200 mL 04/27/24 0800    Microbiology/Sepsis markers: Results for orders placed or performed during the hospital encounter of 04/17/24  MRSA Next Gen by PCR, Nasal     Status: None   Collection Time: 04/17/24  8:06 PM   Specimen: Nasal Mucosa; Nasal Swab  Result Value Ref Range Status   MRSA by PCR Next Gen NOT DETECTED NOT DETECTED Final    Comment: (NOTE) The GeneXpert MRSA Assay (FDA approved for NASAL specimens only), is one component of a comprehensive MRSA colonization surveillance program. It is not intended to diagnose MRSA infection nor to guide or monitor treatment for MRSA infections. Test performance is not FDA approved in patients less than 50 years old. Performed at [facility], [address], [place], [place] [address]   Culture, Respiratory w Gram Stain     Status: None   Collection Time: 04/22/24  1:08 PM   Specimen: Tracheal Aspirate; Respiratory  Result Value Ref Range Status   Specimen Description TRACHEAL ASPIRATE  Final   Special  Requests NONE  Final   Gram Stain   Final    FEW WBC PRESENT,BOTH PMN AND MONONUCLEAR MODERATE GRAM NEGATIVE RODS FEW GRAM POSITIVE COCCI RARE GRAM POSITIVE RODS Performed at [facility], [address] [place],  [place] [address]    Culture MODERATE KLEBSIELLA AEROGENES  Final   Report Status 04/24/2024 FINAL  Final   Organism ID, Bacteria KLEBSIELLA AEROGENES  Final      Susceptibility   Klebsiella aerogenes - MIC*    CEFEPIME  <=0.12 SENSITIVE Sensitive     CEFTRIAXONE 32 RESISTANT Resistant     CIPROFLOXACIN <=0.06 SENSITIVE Sensitive     GENTAMICIN <=1 SENSITIVE Sensitive     MEROPENEM  <=0.25 SENSITIVE Sensitive     TRIMETH/SULFA <=20 SENSITIVE Sensitive     PIP/TAZO Value in next row Intermediate      64 INTERMEDIATEThis is a modified FDA-approved test that has been validated and its performance characteristics determined by the reporting laboratory.  This laboratory is certified under the Clinical Laboratory Improvement Amendments CLIA as qualified to perform high complexity clinical laboratory testing.    * MODERATE KLEBSIELLA AEROGENES    Anti-infectives:  Anti-infectives (From admission, onward)    Start     Dose/Rate Route Frequency Ordered Stop   04/22/24 1115  ceFEPIme  (MAXIPIME ) 2 g in sodium chloride  0.9 % 100 mL IVPB        2 g 200 mL/hr over 30 Minutes Intravenous Every 12 hours 04/22/24 1024 04/28/24 2359   04/20/24 2045  ceFAZolin  (ANCEF ) IVPB 2g/100 mL premix       Note to Pharmacy: Anesthesia to give preop   2 g 200 mL/hr over 30 Minutes Intravenous  Once 04/20/24 1948 04/20/24 2053      Consults: Treatment Team:  Darnella Dorn SAUNDERS, MD    Studies:    Events:  Subjective:    Overnight Issues: diuresed yesterday, weaned all day  Objective:  Vital signs for last 24 hours: Temp:  [98 F (36.7 C)-98.7 F (37.1 C)] 98.1 F (36.7 C) (01/04 0400) Pulse Rate:  [84-117] 104 (01/04 0800) Resp:  [12-26] 19 (01/04 0800) BP: (92-139)/(43-81) 98/68 (01/04 0700) SpO2:  [91 %-100 %] 95 % (01/04 0800) Arterial Line BP: (105-158)/(44-84) 128/54 (01/04 0800) FiO2 (%):  [50 %] 50 % (01/04 0338) Weight:  [116.7 kg] 116.7 kg (01/04 0500)  Hemodynamic parameters for last 24  hours: CVP:  [26 mmHg-29 mmHg] 26 mmHg  Intake/Output from previous day: 01/03 0701 - 01/04 0700 In: 5170.3 [I.V.:1804.3; Blood:304; NG/GT:2170; IV Piggyback:892] Out: 6070 [Urine:6070]  Intake/Output this shift: Total I/O In: 128 [I.V.:73; NG/GT:55] Out: 200 [Urine:200]  Vent settings for last 24 hours: Vent Mode: PRVC FiO2 (%):  [50 %] 50 % Set Rate:  [20 bmp] 20 bmp Vt Set:  [560 mL] 560 mL PEEP:  [8 cmH20] 8 cmH20 Pressure Support:  [5 cmH20] 5 cmH20 Plateau Pressure:  [20 cmH20-24 cmH20] 20 cmH20  Physical Exam:  General: on vent Neuro: sedated, did not F/C for me Resp: clear CVS: IRR GI: soft, NT Extremities: edema 4+  Results for orders placed or performed during the hospital encounter of 04/17/24 (from the past 24 hours)  Glucose, capillary     Status: Abnormal   Collection Time: 04/26/24 11:42 AM  Result Value Ref Range   Glucose-Capillary 161 (H) 70 - 99 mg/dL  Basic metabolic panel     Status: Abnormal   Collection Time: 04/26/24  1:01 PM  Result Value Ref Range  Sodium 143 135 - 145 mmol/L   Potassium 3.8 3.5 - 5.1 mmol/L   Chloride 111 98 - 111 mmol/L   CO2 22 22 - 32 mmol/L   Glucose, Bld 202 (H) 70 - 99 mg/dL   BUN 69 (H) 8 - 23 mg/dL   Creatinine, Ser 8.38 (H) 0.61 - 1.24 mg/dL   Calcium  8.0 (L) 8.9 - 10.3 mg/dL   GFR, Estimated 40 (L) >60 mL/min   Anion gap 10 5 - 15  CBC     Status: Abnormal   Collection Time: 04/26/24  1:01 PM  Result Value Ref Range   WBC 12.6 (H) 4.0 - 10.5 K/uL   RBC 2.16 (L) 4.22 - 5.81 MIL/uL   Hemoglobin 7.0 (L) 13.0 - 17.0 g/dL   HCT 78.8 (L) 60.9 - 47.9 %   MCV 97.7 80.0 - 100.0 fL   MCH 32.4 26.0 - 34.0 pg   MCHC 33.2 30.0 - 36.0 g/dL   RDW 81.3 (H) 88.4 - 84.4 %   Platelets 132 (L) 150 - 400 K/uL   nRBC 9.0 (H) 0.0 - 0.2 %  Prepare RBC (crossmatch)     Status: None   Collection Time: 04/26/24  3:13 PM  Result Value Ref Range   Order Confirmation      ORDER PROCESSED BY BLOOD BANK Performed at [facility], [address], [place], [place] [address]   Glucose, capillary     Status: Abnormal   Collection Time: 04/26/24  4:13 PM  Result Value Ref Range   Glucose-Capillary 100 (H) 70 - 99 mg/dL  Glucose, capillary     Status: Abnormal   Collection Time: 04/26/24  7:35 PM  Result Value Ref Range   Glucose-Capillary 182 (H) 70 - 99 mg/dL  CBC with Differential/Platelet     Status: Abnormal   Collection Time: 04/26/24  9:09 PM  Result Value Ref Range   WBC 16.5 (H) 4.0 - 10.5 K/uL   RBC 2.60 (L) 4.22 - 5.81 MIL/uL   Hemoglobin 8.2 (L) 13.0 - 17.0 g/dL   HCT 75.1 (L) 60.9 - 47.9 %   MCV 95.4 80.0 - 100.0 fL   MCH 31.5 26.0 - 34.0 pg   MCHC 33.1 30.0 - 36.0 g/dL   RDW 81.0 (H) 88.4 - 84.4 %   Platelets 113 (L) 150 - 400 K/uL   nRBC 7.9 (H) 0.0 - 0.2 %   Neutrophils Relative % 82 %   Neutro Abs 13.5 (H) 1.7 - 7.7 K/uL   Lymphocytes Relative 7 %   Lymphs Abs 1.2 0.7 - 4.0 K/uL   Monocytes Relative 8 %   Monocytes Absolute 1.3 (H) 0.1 - 1.0 K/uL   Eosinophils Relative 0 %   Eosinophils Absolute 0.0 0.0 - 0.5 K/uL   Basophils Relative 0 %   Basophils Absolute 0.0 0.0 - 0.1 K/uL   Immature Granulocytes 3 %   Abs Immature Granulocytes 0.46 (H) 0.00 - 0.07 K/uL  Basic metabolic panel     Status: Abnormal   Collection Time: 04/26/24  9:09 PM  Result Value Ref Range   Sodium 144 135 - 145 mmol/L   Potassium 3.9 3.5 - 5.1 mmol/L   Chloride 111 98 - 111 mmol/L   CO2 22 22 - 32 mmol/L   Glucose, Bld 176 (H) 70 - 99 mg/dL   BUN 73 (H) 8 - 23 mg/dL   Creatinine, Ser 8.36 (H) 0.61 - 1.24 mg/dL   Calcium  7.9 (L)  8.9 - 10.3 mg/dL   GFR, Estimated 39 (L) >60 mL/min   Anion gap 11 5 - 15  Glucose, capillary     Status: Abnormal   Collection Time: 04/26/24 11:53 PM  Result Value Ref Range   Glucose-Capillary 182 (H) 70 - 99 mg/dL  Glucose, capillary     Status: Abnormal   Collection Time: 04/27/24  3:54 AM  Result Value Ref Range   Glucose-Capillary 186 (H) 70 - 99 mg/dL  Phosphorus      Status: None   Collection Time: 04/27/24  5:38 AM  Result Value Ref Range   Phosphorus 2.8 2.5 - 4.6 mg/dL  CBC     Status: Abnormal   Collection Time: 04/27/24  5:38 AM  Result Value Ref Range   WBC 14.6 (H) 4.0 - 10.5 K/uL   RBC 2.53 (L) 4.22 - 5.81 MIL/uL   Hemoglobin 7.9 (L) 13.0 - 17.0 g/dL   HCT 75.6 (L) 60.9 - 47.9 %   MCV 96.0 80.0 - 100.0 fL   MCH 31.2 26.0 - 34.0 pg   MCHC 32.5 30.0 - 36.0 g/dL   RDW 80.3 (H) 88.4 - 84.4 %   Platelets 115 (L) 150 - 400 K/uL   nRBC 6.5 (H) 0.0 - 0.2 %  Magnesium      Status: None   Collection Time: 04/27/24  5:38 AM  Result Value Ref Range   Magnesium  2.3 1.7 - 2.4 mg/dL  Basic metabolic panel with GFR     Status: Abnormal   Collection Time: 04/27/24  5:38 AM  Result Value Ref Range   Sodium 145 135 - 145 mmol/L   Potassium 3.8 3.5 - 5.1 mmol/L   Chloride 109 98 - 111 mmol/L   CO2 24 22 - 32 mmol/L   Glucose, Bld 175 (H) 70 - 99 mg/dL   BUN 82 (H) 8 - 23 mg/dL   Creatinine, Ser 8.13 (H) 0.61 - 1.24 mg/dL   Calcium  8.2 (L) 8.9 - 10.3 mg/dL   GFR, Estimated 34 (L) >60 mL/min   Anion gap 12 5 - 15    Assessment & Plan: Present on Admission:  Critical polytrauma    LOS: 10 days   Additional comments:I reviewed the patient's new clinical lab test results. / Injuries: - Right femur fracture - SDH/SAH - Nondepressed parietoocipital bone fracture - Trace left basilar pneumothorax - Bilateral rib fractures - Thoracic TP fractures - Possible T9-10 discoligamentous injury - Left psoas hemorrhage without extravasation  Plan: - Appreciate neurosurgery recommendations, Dr. Garst              - MRI thoracic spine done 1/2 - NS plan pending  - Repeat [facility] 12/28 with stable ICH, but new occipital stroke, Dr. Merrianne consulted. MRI brain also done 1/2 - q1h neuro check - CTA Neck pending to evaluate for embolic source - Appreciate orthopaedics recommendations, Dr. Jerri, Dr. Kendal - ORIF right femur 12/29 (Dr. Jerri)  - Acute blood  loss anemia   - Shock - likely hemorrhagic, cardiogenic (CHF, afib s/p cardioversion and now amio), appreciate CCM management and I D/W Dr. Claudene on the unit today - further diuresis  - acute hypoxic ventilator dependent respiratory failure - weaned all day yesterday. Appreciate CCM management. Try to optimize for trial of extubation tomorrow.  - ID - cefepime  for klebsiella PNA  - FEN - strict NPO, cortrak  - DVT - SCDs, holding chemical ppx due to bleeding concerns  - Dispo - ICU, Dr. Claudene  and I met with his wife to discuss the current clinical situation and the plan of care. We also discussed goals of care. We all agree it will be best for Doug to be involved in the decision for dialysis and/or re-intubation. Plan to further diurese today with hope to extubate tomorrow AM. Doug's son will also come in tomorrow to participate in discussions.   Appreciate CCM collaboration   Critical Care Total Time*: 32 Minutes  Dann Hummer, MD, MPH, FACS Trauma & General Surgery Use [email or web address] to contact on call provider  04/27/2024  *Care during the described time interval was provided by me. I have reviewed this patient's available data, including medical history, events of note, physical examination and test results as part of my evaluation.

## 2024-04-27 NOTE — Progress Notes (Signed)
 RT discovered pt on a rate of 26. Not documentation supports the rate change. Pt. Placed back usual rate of 20.

## 2024-04-27 NOTE — Progress Notes (Signed)
 Subjective: The patient is intubated and sedated.  He is in no apparent distress.  His wife is at the bedside.  Objective: Vital signs in last 24 hours: Temp:  [98 F (36.7 C)-99.3 F (37.4 C)] 99.3 F (37.4 C) (01/04 0800) Pulse Rate:  [84-117] 104 (01/04 0800) Resp:  [12-26] 19 (01/04 0800) BP: (92-139)/(43-81) 98/68 (01/04 0700) SpO2:  [91 %-100 %] 95 % (01/04 0800) Arterial Line BP: (105-158)/(44-84) 128/54 (01/04 0800) FiO2 (%):  [50 %] 50 % (01/04 0338) Weight:  [116.7 kg] 116.7 kg (01/04 0500) Estimated body mass index is 35.88 kg/m as calculated from the following:   Height as of this encounter: 5' 11 (1.803 m).   Weight as of this encounter: 116.7 kg.   Intake/Output from previous day: 01/03 0701 - 01/04 0700 In: 5170.3 [I.V.:1804.3; Blood:304; NG/GT:2170; IV Piggyback:892] Out: 6070 [Urine:6070] Intake/Output this shift: Total I/O In: 128 [I.V.:73; NG/GT:55] Out: 200 [Urine:200]  Physical exam the patient is intubated.  He follows commands.  He moves all 4 extremities.  Lab Results: Recent Labs    04/26/24 2109 04/27/24 0538  WBC 16.5* 14.6*  HGB 8.2* 7.9*  HCT 24.8* 24.3*  PLT 113* 115*   BMET Recent Labs    04/26/24 2109 04/27/24 0538  NA 144 145  K 3.9 3.8  CL 111 109  CO2 22 24  GLUCOSE 176* 175*  BUN 73* 82*  CREATININE 1.63* 1.86*  CALCIUM  7.9* 8.2*    Studies/Results: MR BRAIN WO CONTRAST Result Date: 04/25/2024 EXAM: MRI BRAIN WITHOUT CONTRAST 04/25/2024 03:35:17 PM TECHNIQUE: Multiplanar multisequence MRI of the head/brain was performed without the administration of intravenous contrast. COMPARISON: CT head 04/20/2024. CLINICAL HISTORY: Stroke, follow up. FINDINGS: BRAIN AND VENTRICLES: Similar appearance of acute bilateral subdural hematomas measuring up to 8 mm in thickness bilaterally. No midline shift. Scattered foci of subarachnoid hemorrhage along the interhemispheric fissure and cerebral convexities. Unchanged hemorrhagic contusion  in the left occipital lobe. Scattered tiny foci of acute infarction within the bilateral cerebral hemispheres, right cerebellar hemisphere, and within the splenium of the corpus callosum, suggestive of cardioembolic etiology. No mass. No hydrocephalus. The sella is unremarkable. Normal flow voids. ORBITS: No acute abnormality. SINUSES AND MASTOIDS: No acute abnormality. BONES AND SOFT TISSUES: Normal marrow signal. No acute soft tissue abnormality. IMPRESSION: 1. Scattered tiny foci of acute infarction within the bilateral cerebral hemispheres, right cerebellar hemisphere, and within the splenium of the corpus callosum, suggestive of a cardioembolic etiology. 2. Acute bilateral subdural hematomas measuring up to 8 mm in thickness bilaterally, with no midline shift. 3. Scattered foci of subarachnoid hemorrhage along the interhemispheric fissure and cerebral convexities. 4. Unchanged hemorrhagic contusion in the left occipital lobe. Electronically signed by: Ryan Chess MD 04/25/2024 04:16 PM EST RP Workstation: [id number]   MR THORACIC SPINE WO CONTRAST Result Date: 04/25/2024 EXAM: MRI THORACIC SPINE WITHOUT INTRAVENOUS CONTRAST 04/25/2024 03:55:48 PM TECHNIQUE: Multiplanar multisequence MRI of the thoracic spine was performed without the administration of intravenous contrast. COMPARISON: CT thoracic spine 04/17/2024. CLINICAL HISTORY: Back trauma, abnormal neuro exam, CT or xray positive (Age >= 16y). FINDINGS: BONES AND ALIGNMENT: Normal alignment. Acute fracture of the anterior osteophyte at T9-T10 with extension through the superior endplate of T10. No extension to involve the posterior elements or posterior ligamentous complexes. Acute fracture of the left T12 transverse process with extension into the pars interarticularis. Somewhat linear marrow edema through the inferior aspect of the T4 vertebral body, suspicious for acute fracture. No involvement of the posterior elements.  Chronic appearing mild  anterior compression deformity of the T3 vertebral body. Bone marrow signal is unremarkable except for the findings at T4, T9-T10, and T12. Normal vertebral body heights except for the chronic compression deformity at T3 and the superior endplate fracture at T10. SPINAL CORD: Normal spinal cord volume. Normal spinal cord signal. No epidural hematoma. SOFT TISSUES: Small bilateral pleural effusions. Unremarkable otherwise. DEGENERATIVE CHANGES: No significant disc herniation or high grade spinal canal stenosis in the thoracic spine. Moderate bilateral facet arthropathy at T9-T10 results in moderate bilateral neural foraminal narrowing. Disc bulge and right greater than left facet arthropathy at T11-T12 results in severe right neural foraminal narrowing. IMPRESSION: 1. At T9-T10, acute fracture of the anterior osteophyte with extension through the superior endplate of T10, without involvement of the posterior elements or posterior ligamentous complexes. 2. At T12, acute fracture of the left transverse process with extension into the pars interarticularis. 3. At T4, linear marrow edema through the inferior aspect of the vertebral body, suspicious for acute fracture, without involvement of the posterior elements. 4. At T11-T12, disc bulge and right greater than left facet arthropathy results in severe right neural foraminal narrowing. 5. Small bilateral pleural effusions. Electronically signed by: Ryan Chess MD 04/25/2024 04:10 PM EST RP Workstation: [id number]   DG CHEST PORT 1 VIEW Result Date: 04/25/2024 CLINICAL DATA:  Endotracheal tube evaluation. EXAM: PORTABLE CHEST 1 VIEW COMPARISON:  04/21/2024 FINDINGS: Endotracheal tube tip 5.1 cm in the carina. Weighted enteric tube tip below the diaphragm in the stomach. Right upper extremity PICC tip in the brachiocephalic SVC confluence. Stable cardiomegaly. Bilateral pleural effusions with mild improvement. Patchy opacity in both lung bases. Slight increase in  diffuse interstitial opacity. No pneumothorax. Displaced right rib fractures. IMPRESSION: 1. Stable support apparatus. 2. Bilateral pleural effusions with mild improvement. 3. Slight increase in diffuse interstitial opacity, likely pulmonary edema. Electronically Signed   By: Andrea Gasman M.D.   On: 04/25/2024 13:51    Assessment/Plan: Head injury, thoracic ligamentous injury: The patient is stable.  I have answered all the patient's wife's questions.  LOS: 10 days     Tom Norton Budge 04/27/2024, 8:47 AM     Patient ID: [name], male   DOB: 1929/05/27, 89 y.o.   MRN: [id number]

## 2024-04-27 NOTE — Progress Notes (Signed)
 "  NAME:  Tom Norton, MRN:  [id number], DOB:  [birth year], LOS: 10 ADMISSION DATE:  04/17/2024, CONSULTATION DATE:  04/27/2024 REFERRING MD:  Trauma, CHIEF COMPLAINT:  shock   History of Present Illness:  89 year old man admitted as polytrauma after fall from deck with multiple orthopedic injuries, fracture, whom we are consulted for hypoxemia and shock.  Patient was admitted Christmas Day after fall from deck per report.  Initial images reveal left-sided rib fractures, right femur fracture, possible iliac fractures, left posterior subdural hematoma no midline shift, small left posterior parafalcine subdural hematoma, traumatic subarachnoid hemorrhage, skull fracture, trace left apical pneumothorax.  Chest x-ray 12/28 demonstrated left-sided atelectasis and small pleural effusion on my review interpretation.  He went for femur repair 12/29.  He named and remains intubated after procedure.  Chest x-ray 12/29 reveals ET tube placement, similar right-sided infiltrates and atelectasis, new rounded left-sided infiltrate on my review interpretation.  He developed shock now on maximum dose norepinephrine , vasopressin , started on phenylephrine  overnight.  He remains intubated.  Minimal vent settings.  He is on Precedex  for sedation.  Weaning Precedex .  Blood pressure get a bit better.  Review of labs reveal serial low hemoglobins.  He is getting a transfusion today.  He has had transfusions earlier this admission.  Pertinent  Medical History    Significant Hospital Events: Including procedures, antibiotic start and stop dates in addition to other pertinent events   12/25 admitted after fall 12/29 OR fro femur fracture repair  12/30 PCCM consult, AFIB RVR overnight  Interim History / Subjective:  Stubborn diuresis Failed SBT due to desats Wife at bedside  Objective    Blood pressure 119/80, pulse (!) 106, temperature 99.3 F (37.4 C), temperature source Axillary, resp. rate 20, height 5' 11 (1.803  m), weight 116.7 kg, SpO2 98%. CVP:  [26 mmHg-29 mmHg] 26 mmHg  Vent Mode: PRVC FiO2 (%):  [50 %-100 %] 100 % Set Rate:  [20 bmp] 20 bmp Vt Set:  [560 mL] 560 mL PEEP:  [8 cmH20] 8 cmH20 Pressure Support:  [5 cmH20] 5 cmH20 Plateau Pressure:  [20 cmH20-24 cmH20] 20 cmH20   Intake/Output Summary (Last 24 hours) at 04/27/2024 1024 Last data filed at 04/27/2024 1000 Gross per 24 hour  Intake 4866.27 ml  Output 6275 ml  Net -1408.73 ml   Filed Weights   04/25/24 0500 04/26/24 0500 04/27/24 0500  Weight: 113.2 kg 117.3 kg 116.7 kg    Examination: Lung compliance remains good Ongoing diminished breath sounds See yesterday for US  exam Ongoing severe anasarca Not following commands on current sedation levels: will wean Heart sounds irregular   amiodarone  30 mg/hr (04/27/24 1000)   ceFEPime  (MAXIPIME ) IV Stopped (04/26/24 2307)   dexmedetomidine  (PRECEDEX ) IV infusion 0.9 mcg/kg/hr (04/27/24 1000)   fentaNYL  infusion INTRAVENOUS 50 mcg/hr (04/27/24 1000)   furosemide  (LASIX ) 200 mg in dextrose  5 % 100 mL (2 mg/mL) infusion 20 mg/hr (04/27/24 1000)   norepinephrine  (LEVOPHED ) Adult infusion 2 mcg/min (04/27/24 1000)   propofol  (DIPRIVAN ) infusion Stopped (04/26/24 0912)   vasopressin  0.03 Units/min (04/27/24 1000)   Patient Lines/Drains/Airways Status     Active Line/Drains/Airways     Name Placement date Placement time Site Days   Arterial Line 04/23/24 Left Radial 04/23/24  0240  Radial  4   Peripheral IV 04/17/24 20 G Anterior;Distal;Left Forearm 04/17/24  1849  Forearm  10   PICC Triple Lumen 04/17/24 Right Brachial 40 cm 0 cm 04/17/24  2320  -- 10  Negative Pressure Wound Therapy Knee Anterior;Left 02/18/21  1708  --  1164   Urethral Catheter [name], RN Latex 16 Fr. 04/18/24  0000  Latex  9   Airway 7.5 mm 04/21/24  1211  -- 6   Small Bore Feeding Tube 10 Fr. Left nare Marking at nare/corner of mouth 61 cm 04/18/24  1610  Left nare  9   Wound 04/21/24 1333  Surgical Closed Surgical Incision Hip Right 04/21/24  1333  Hip  6   Wound 04/26/24 0803 Atypical Pretibial Right;Lateral;Lower 04/26/24  0803  Pretibial  1   Wound 04/26/24 0807 Atypical Pretibial Right;Anterior 04/26/24  0807  Pretibial  1   Wound 04/26/24 0807 Pretibial Distal;Right;Anterior 04/26/24  0807  Pretibial  1   Wound 04/26/24 0807 Ankle Anterior;Right 04/26/24  0807  Ankle  1           Assessment and Plan   Polytrauma (TBI, rib fx, spinal fx, psoas hematoma, R femur fx post ORIF) Type 1 resp failure related now mostly to massive volume overload with diuresis limited by hemodynamics Ongoing vasoplegia Klebsiella HCAP on cefepime  x 7 days Ongoing ABLA query phlebotomy and critical illness- last transfusion 04/27/23 Afib/RVR relatedd to atrial stretch, improved with amio  Continue midodrine , wean levo for MAP 65 Unna boots with blister care per WOC, appreciate help Push diuresis again today; noted creatinine bump but this is his only chance through this to mobilize fluid End date cefepime  today Switch sedation to PRN opiate + precedex  Remainder of polytrauma per trauma team Tentative plan tomorrow to wean sedation again and consider extubation so we can ask patient how far he wants to push this; I am worried that only way to rehab him is to get fluid off which likely will require dialysis which is a step I don't think is congruent with his wishes Very guarded prognosis Will follow, d/w Dr. Sebastian  33 min cc time Rolan Sharps MD PCCM "

## 2024-04-28 ENCOUNTER — Inpatient Hospital Stay (HOSPITAL_COMMUNITY)

## 2024-04-28 DIAGNOSIS — W19XXXA Unspecified fall, initial encounter: Secondary | ICD-10-CM | POA: Diagnosis not present

## 2024-04-28 DIAGNOSIS — Z515 Encounter for palliative care: Secondary | ICD-10-CM | POA: Diagnosis not present

## 2024-04-28 DIAGNOSIS — S2243XA Multiple fractures of ribs, bilateral, initial encounter for closed fracture: Secondary | ICD-10-CM

## 2024-04-28 DIAGNOSIS — S270XXA Traumatic pneumothorax, initial encounter: Secondary | ICD-10-CM

## 2024-04-28 DIAGNOSIS — S065XAA Traumatic subdural hemorrhage with loss of consciousness status unknown, initial encounter: Secondary | ICD-10-CM | POA: Diagnosis not present

## 2024-04-28 DIAGNOSIS — Z7189 Other specified counseling: Secondary | ICD-10-CM | POA: Diagnosis not present

## 2024-04-28 DIAGNOSIS — M7989 Other specified soft tissue disorders: Secondary | ICD-10-CM

## 2024-04-28 DIAGNOSIS — T07XXXA Unspecified multiple injuries, initial encounter: Secondary | ICD-10-CM | POA: Diagnosis not present

## 2024-04-28 LAB — RESPIRATORY PANEL BY PCR

## 2024-04-28 LAB — GLUCOSE, CAPILLARY
Glucose-Capillary: 151 mg/dL — ABNORMAL HIGH (ref 70–99)
Glucose-Capillary: 128 mg/dL — ABNORMAL HIGH (ref 70–99)
Glucose-Capillary: 150 mg/dL — ABNORMAL HIGH (ref 70–99)
Glucose-Capillary: 161 mg/dL — ABNORMAL HIGH (ref 70–99)
Glucose-Capillary: 164 mg/dL — ABNORMAL HIGH (ref 70–99)
Glucose-Capillary: 176 mg/dL — ABNORMAL HIGH (ref 70–99)

## 2024-04-28 LAB — RENAL FUNCTION PANEL
Albumin: 3.1 g/dL — ABNORMAL LOW (ref 3.5–5.0)
Anion gap: 13 (ref 5–15)
BUN: 91 mg/dL — ABNORMAL HIGH (ref 8–23)
CO2: 24 mmol/L (ref 22–32)
Calcium: 8.5 mg/dL — ABNORMAL LOW (ref 8.9–10.3)
Chloride: 105 mmol/L (ref 98–111)
Creatinine, Ser: 2.33 mg/dL — ABNORMAL HIGH (ref 0.61–1.24)
GFR, Estimated: 25 mL/min — ABNORMAL LOW
Glucose, Bld: 159 mg/dL — ABNORMAL HIGH (ref 70–99)
Phosphorus: 3.4 mg/dL (ref 2.5–4.6)
Potassium: 4.6 mmol/L (ref 3.5–5.1)
Sodium: 141 mmol/L (ref 135–145)

## 2024-04-28 LAB — BASIC METABOLIC PANEL WITH GFR
Anion gap: 13 (ref 5–15)
BUN: 102 mg/dL — ABNORMAL HIGH (ref 8–23)
CO2: 24 mmol/L (ref 22–32)
Calcium: 8.6 mg/dL — ABNORMAL LOW (ref 8.9–10.3)
Chloride: 106 mmol/L (ref 98–111)
Creatinine, Ser: 2.65 mg/dL — ABNORMAL HIGH (ref 0.61–1.24)
GFR, Estimated: 22 mL/min — ABNORMAL LOW
Glucose, Bld: 128 mg/dL — ABNORMAL HIGH (ref 70–99)
Potassium: 4.7 mmol/L (ref 3.5–5.1)
Sodium: 143 mmol/L (ref 135–145)

## 2024-04-28 LAB — MAGNESIUM: Magnesium: 2.4 mg/dL (ref 1.7–2.4)

## 2024-04-28 LAB — CBC
HCT: 26.3 % — ABNORMAL LOW (ref 39.0–52.0)
Hemoglobin: 8.2 g/dL — ABNORMAL LOW (ref 13.0–17.0)
MCH: 31.3 pg (ref 26.0–34.0)
MCHC: 31.2 g/dL (ref 30.0–36.0)
MCV: 100.4 fL — ABNORMAL HIGH (ref 80.0–100.0)
Platelets: 145 K/uL — ABNORMAL LOW (ref 150–400)
RBC: 2.62 MIL/uL — ABNORMAL LOW (ref 4.22–5.81)
RDW: 19.9 % — ABNORMAL HIGH (ref 11.5–15.5)
WBC: 16.7 K/uL — ABNORMAL HIGH (ref 4.0–10.5)
nRBC: 2.6 % — ABNORMAL HIGH (ref 0.0–0.2)

## 2024-04-28 LAB — PHOSPHORUS: Phosphorus: 4 mg/dL (ref 2.5–4.6)

## 2024-04-28 MED ORDER — PRISMASOL BGK 4/2.5 32-4-2.5 MEQ/L EC SOLN: Status: DC

## 2024-04-28 MED ORDER — HEPARIN SODIUM (PORCINE) 1000 UNIT/ML DIALYSIS
1000.0000 [IU] | INTRAMUSCULAR | Status: DC | PRN
  Administered 2024-05-01 – 2024-05-02 (×2): 2400 [IU] via INTRAVENOUS_CENTRAL
  Filled 2024-04-28: qty 6
  Filled 2024-04-28 (×2): qty 3
  Filled 2024-04-28: qty 6

## 2024-04-28 MED ORDER — HEPARIN (PORCINE) 2000 UNITS/L FOR CRRT: INTRAVENOUS_CENTRAL | Status: DC | PRN

## 2024-04-28 MED ORDER — VANCOMYCIN HCL 1250 MG/250ML IV SOLN
1250.0000 mg | INTRAVENOUS | Status: DC
  Administered 2024-04-29 – 2024-05-01 (×3): 1250 mg via INTRAVENOUS
  Filled 2024-04-28 (×3): qty 250

## 2024-04-28 MED ORDER — SODIUM CHLORIDE 0.9 % IV SOLN
1.0000 g | Freq: Two times a day (BID) | INTRAVENOUS | Status: DC
  Administered 2024-04-28: 1 g via INTRAVENOUS
  Filled 2024-04-28: qty 20

## 2024-04-28 MED ORDER — SODIUM CHLORIDE 0.9 % IV SOLN
500.0000 [IU]/h | INTRAVENOUS | Status: DC
  Administered 2024-04-28 – 2024-04-29 (×2): 500 [IU]/h via INTRAVENOUS_CENTRAL
  Filled 2024-04-28 (×2): qty 10000

## 2024-04-28 MED ORDER — PROSOURCE TF20 ENFIT COMPATIBL EN LIQD
60.0000 mL | Freq: Three times a day (TID) | ENTERAL | Status: DC
  Administered 2024-04-28 – 2024-05-04 (×20): 60 mL
  Filled 2024-04-28 (×19): qty 60

## 2024-04-28 MED ORDER — HEPARIN SODIUM (PORCINE) 5000 UNIT/ML IJ SOLN
5000.0000 [IU] | Freq: Three times a day (TID) | INTRAMUSCULAR | Status: DC
  Administered 2024-04-28 – 2024-04-29 (×4): 5000 [IU] via SUBCUTANEOUS
  Filled 2024-04-28 (×4): qty 1

## 2024-04-28 MED ORDER — SODIUM CHLORIDE 0.9 % IV SOLN
1.0000 g | Freq: Three times a day (TID) | INTRAVENOUS | Status: DC
  Administered 2024-04-28 – 2024-05-02 (×12): 1 g via INTRAVENOUS
  Filled 2024-04-28 (×12): qty 20

## 2024-04-28 MED ORDER — VANCOMYCIN HCL 2000 MG/400ML IV SOLN
2000.0000 mg | Freq: Once | INTRAVENOUS | Status: AC
  Administered 2024-04-28: 2000 mg via INTRAVENOUS
  Filled 2024-04-28: qty 400

## 2024-04-28 MED ORDER — PIVOT 1.5 CAL PO LIQD
1000.0000 mL | ORAL | Status: DC
  Administered 2024-04-29 – 2024-05-04 (×4): 1000 mL

## 2024-04-28 NOTE — Progress Notes (Signed)
 VASCULAR LAB    Bilateral lower extremity venous duplex has been performed.  See CV proc for preliminary results.    Farzana Koci, RVT 04/28/2024, 3:07 PM

## 2024-04-28 NOTE — Consult Note (Signed)
 [facility]  HISTORY AND PHYSICAL  Tom Norton is an 89 y.o. male.    Chief Complaint: fall from deck  HPI: Pt is a 31M with a PMH sig for HTN, Afib, hypothyroidism, neuropathy, CHF who is now seen in consultation at the request of Dr Claudene for management of AKI and volume overload.  Pt is intubated and there is no family at bedside.    Was admitted Christmas after falling off a deck.  Has sig polytrauma.  Developed hemorrhagic/ septic shock.  Also developed Afib with RVR and remains intubated- 12/29 after femur repair.    His BUN is 102.  [birth year] birthday.  He's making urine but not enough to unload volume from heart/ lungs.  Family has requested full scope care including trying CRRT.    PMH: Past Medical History:  Diagnosis Date   BPH (benign prostatic hyperplasia)    Cardiomegaly    Dilated aortic root    a. 09/2013: 4.2cm by echo.   Dysrhythmia    Enlarged prostate without lower urinary tract symptoms (luts)    GERD (gastroesophageal reflux disease)    rare   History of hiatal hernia    Hyperlipidemia    Hypertension    Hypothyroidism    Insomnia    Junctional bradycardia    OA (osteoarthritis)    Obesity    Paroxysmal A-fib (HCC)    a. 09/23/13 a-fib with RVR and hypotensive, requiring emergent cardioversion in ED   Peripheral neuropathy 07/17/2017   Vitamin D  deficiency    PSH: Past Surgical History:  Procedure Laterality Date   CARDIOVERSION  2015   CARPAL TUNNEL RELEASE Left 2019   I & D EXTREMITY Left 02/18/2021   Procedure: IRRIGATION AND DEBRIDEMENT KNEE;  Surgeon: Harden Jerona GAILS, MD;  Location: [facility] OR;  Service: Orthopedics;  Laterality: Left;   INGUINAL HERNIA REPAIR Left 05/03/2019   Procedure: HERNIA REPAIR INGUINAL ADULT WITH MESH;  Surgeon: Eletha Boas, MD;  Location: [facility] ORS;  Service: General;  Laterality: Left;   INGUINAL HERNIA REPAIR Left 1980   INGUINAL HERNIA REPAIR Right 09/12/2021   Procedure: OPEN REPAIR RIGHT INGUINAL  HERNIA WITH MESH;  Surgeon: Eletha Boas, MD;  Location: [facility] ORS;  Service: General;  Laterality: Right;   TOTAL KNEE ARTHROPLASTY Left 07/20/2017   Procedure: LEFT TOTAL KNEE ARTHROPLASTY;  Surgeon: Gerome Charleston, MD;  Location: [facility];  Service: Orthopedics;  Laterality: Left;  Adductor Block   TRANSTHORACIC ECHOCARDIOGRAM  09/23/2013   EF 55-60%; normal wall motion. Moderately dilated aortic root and ascending aorta; mild AI; moderate bi-atrial enlargement    Past Medical History:  Diagnosis Date   BPH (benign prostatic hyperplasia)    Cardiomegaly    Dilated aortic root    a. 09/2013: 4.2cm by echo.   Dysrhythmia    Enlarged prostate without lower urinary tract symptoms (luts)    GERD (gastroesophageal reflux disease)    rare   History of hiatal hernia    Hyperlipidemia    Hypertension    Hypothyroidism    Insomnia    Junctional bradycardia    OA (osteoarthritis)    Obesity    Paroxysmal A-fib (HCC)    a. 09/23/13 a-fib with RVR and hypotensive, requiring emergent cardioversion in ED   Peripheral neuropathy 07/17/2017   Vitamin D  deficiency     Medications:  Scheduled:  sodium chloride    Intravenous Once   acetaminophen  (TYLENOL ) oral liquid 160 mg/5 mL  650 mg Per Tube Q6H  artificial tears   Both Eyes QID   Chlorhexidine  Gluconate Cloth  6 each Topical Q0600   docusate  100 mg Per Tube BID   feeding supplement (PROSource TF20)  60 mL Per Tube TID   heparin  injection (subcutaneous)  5,000 Units Subcutaneous Q8H   insulin  aspart  0-15 Units Subcutaneous Q4H   insulin  glargine-yfgn  8 Units Subcutaneous Daily   levothyroxine   50 mcg Per Tube QAC breakfast   midodrine   10 mg Per Tube TID WC   mouth rinse  15 mL Mouth Rinse Q2H   oxyCODONE   5 mg Per Tube Q6H   pantoprazole  (PROTONIX ) IV  40 mg Intravenous Q24H   polyethylene glycol  17 g Per Tube Daily   sodium chloride  flush  10-40 mL Intracatheter Q12H    Medications Prior to Admission  Medication Sig Dispense  Refill   AREXVY 120 MCG/0.5ML injection Inject 0.5 mLs into the muscle once.     CINNAMON PO Take 2,000 mg by mouth daily.     Coenzyme Q10 (CO Q10) 100 MG CAPS Take 100 mg by mouth daily.     cyanocobalamin  1000 MCG tablet Take 1,000 mcg by mouth daily.     ELIQUIS  5 MG TABS tablet TAKE 1 TABLET BY MOUTH 2 TIMES DAILY. 60 tablet 5   eszopiclone  (LUNESTA ) 1 MG TABS tablet Take 1 tablet (1 mg total) by mouth at bedtime. Take immediately before bedtime 5 tablet 0   levothyroxine  (SYNTHROID ) 50 MCG tablet Take 50 mcg by mouth daily before breakfast.     Multiple Vitamin (MULTIVITAMIN WITH MINERALS) TABS Take 1 tablet by mouth daily.     OVER THE COUNTER MEDICATION Take 2 capsules by mouth daily. Quietum Plus Memory supplement     pyridOXINE (VITAMIN B6) 100 MG tablet Take 100 mg by mouth daily.     tamsulosin  (FLOMAX ) 0.4 MG CAPS capsule Take 0.8 mg by mouth daily.      ALLERGIES:  Allergies[1]  FAM HX: Family History  Problem Relation Age of Onset   Heart disease Father    Stroke Brother    Heart disease Brother     Social History:   reports that he has never smoked. He has never been exposed to tobacco smoke. He has never used smokeless tobacco. He reports current alcohol use of about 10.0 standard drinks of alcohol per week. He reports that he does not use drugs.  ROS: ROS: not able to do ROS  Blood pressure 113/60, pulse (!) 59, temperature 99.3 F (37.4 C), temperature source Axillary, resp. rate (!) 22, height 5' 11 (1.803 m), weight 116 kg, SpO2 91%. PHYSICAL EXAM: Physical Exam Gen ill appearing elderly gentleman under drape, getting HD line Heent ETT in place Neck R nontunneled HD cath Remainder of exam deferred    Results for orders placed or performed during the hospital encounter of 04/17/24 (from the past 48 hours)  Glucose, capillary     Status: Abnormal   Collection Time: 04/26/24  4:13 PM  Result Value Ref Range   Glucose-Capillary 100 (H) 70 - 99 mg/dL     Comment: Glucose reference range applies only to samples taken after fasting for at least 8 hours.  Glucose, capillary     Status: Abnormal   Collection Time: 04/26/24  7:35 PM  Result Value Ref Range   Glucose-Capillary 182 (H) 70 - 99 mg/dL    Comment: Glucose reference range applies only to samples taken after fasting for at least 8 hours.  CBC with Differential/Platelet     Status: Abnormal   Collection Time: 04/26/24  9:09 PM  Result Value Ref Range   WBC 16.5 (H) 4.0 - 10.5 K/uL   RBC 2.60 (L) 4.22 - 5.81 MIL/uL   Hemoglobin 8.2 (L) 13.0 - 17.0 g/dL   HCT 75.1 (L) 60.9 - 47.9 %   MCV 95.4 80.0 - 100.0 fL   MCH 31.5 26.0 - 34.0 pg   MCHC 33.1 30.0 - 36.0 g/dL   RDW 81.0 (H) 88.4 - 84.4 %   Platelets 113 (L) 150 - 400 K/uL    Comment: REPEATED TO VERIFY   nRBC 7.9 (H) 0.0 - 0.2 %   Neutrophils Relative % 82 %   Neutro Abs 13.5 (H) 1.7 - 7.7 K/uL   Lymphocytes Relative 7 %   Lymphs Abs 1.2 0.7 - 4.0 K/uL   Monocytes Relative 8 %   Monocytes Absolute 1.3 (H) 0.1 - 1.0 K/uL   Eosinophils Relative 0 %   Eosinophils Absolute 0.0 0.0 - 0.5 K/uL   Basophils Relative 0 %   Basophils Absolute 0.0 0.0 - 0.1 K/uL   Immature Granulocytes 3 %   Abs Immature Granulocytes 0.46 (H) 0.00 - 0.07 K/uL    Comment: Performed at [facility], [address] [place], [place] [address]  Basic metabolic panel     Status: Abnormal   Collection Time: 04/26/24  9:09 PM  Result Value Ref Range   Sodium 144 135 - 145 mmol/L   Potassium 3.9 3.5 - 5.1 mmol/L   Chloride 111 98 - 111 mmol/L   CO2 22 22 - 32 mmol/L   Glucose, Bld 176 (H) 70 - 99 mg/dL    Comment: Glucose reference range applies only to samples taken after fasting for at least 8 hours.   BUN 73 (H) 8 - 23 mg/dL   Creatinine, Ser 8.36 (H) 0.61 - 1.24 mg/dL   Calcium  7.9 (L) 8.9 - 10.3 mg/dL   GFR, Estimated 39 (L) >60 mL/min    Comment: (NOTE) Calculated using the CKD-EPI Creatinine Equation (2021)    Anion gap 11 5 - 15     Comment: Performed at [facility], [address], [place], [place] [address]  Glucose, capillary     Status: Abnormal   Collection Time: 04/26/24 11:53 PM  Result Value Ref Range   Glucose-Capillary 182 (H) 70 - 99 mg/dL    Comment: Glucose reference range applies only to samples taken after fasting for at least 8 hours.  Glucose, capillary     Status: Abnormal   Collection Time: 04/27/24  3:54 AM  Result Value Ref Range   Glucose-Capillary 186 (H) 70 - 99 mg/dL    Comment: Glucose reference range applies only to samples taken after fasting for at least 8 hours.  Phosphorus     Status: None   Collection Time: 04/27/24  5:38 AM  Result Value Ref Range   Phosphorus 2.8 2.5 - 4.6 mg/dL    Comment: Performed at [facility], [address], [place], [place] [address]  CBC     Status: Abnormal   Collection Time: 04/27/24  5:38 AM  Result Value Ref Range   WBC 14.6 (H) 4.0 - 10.5 K/uL   RBC 2.53 (L) 4.22 - 5.81 MIL/uL   Hemoglobin 7.9 (L) 13.0 - 17.0 g/dL   HCT 75.6 (L) 60.9 - 47.9 %   MCV 96.0 80.0 - 100.0 fL   MCH  31.2 26.0 - 34.0 pg   MCHC 32.5 30.0 - 36.0 g/dL   RDW 80.3 (H) 88.4 - 84.4 %   Platelets 115 (L) 150 - 400 K/uL   nRBC 6.5 (H) 0.0 - 0.2 %    Comment: REPEATED TO VERIFY Performed at [facility], [address], [place], [place] [address]   Magnesium      Status: None   Collection Time: 04/27/24  5:38 AM  Result Value Ref Range   Magnesium  2.3 1.7 - 2.4 mg/dL    Comment: Performed at [facility], [address], [place], [place] [address]  Basic metabolic panel with GFR     Status: Abnormal   Collection Time: 04/27/24  5:38 AM  Result Value Ref Range   Sodium 145 135 - 145 mmol/L   Potassium 3.8 3.5 - 5.1 mmol/L    Comment: HEMOLYSIS AT THIS LEVEL MAY AFFECT RESULT   Chloride 109 98 - 111 mmol/L   CO2 24 22 - 32 mmol/L   Glucose, Bld 175 (H) 70 - 99 mg/dL    Comment: Glucose reference range applies only to samples taken after fasting for  at least 8 hours.   BUN 82 (H) 8 - 23 mg/dL   Creatinine, Ser 8.13 (H) 0.61 - 1.24 mg/dL   Calcium  8.2 (L) 8.9 - 10.3 mg/dL   GFR, Estimated 34 (L) >60 mL/min    Comment: (NOTE) Calculated using the CKD-EPI Creatinine Equation (2021)    Anion gap 12 5 - 15    Comment: Performed at [facility], [address], [place], [place] [address]  Glucose, capillary     Status: Abnormal   Collection Time: 04/27/24  8:10 AM  Result Value Ref Range   Glucose-Capillary 150 (H) 70 - 99 mg/dL    Comment: Glucose reference range applies only to samples taken after fasting for at least 8 hours.  I-STAT 7, (LYTES, BLD GAS, ICA, H+H)     Status: Abnormal   Collection Time: 04/27/24  8:55 AM  Result Value Ref Range   pH, Arterial 7.292 (L) 7.35 - 7.45   pCO2 arterial 57.1 (H) 32 - 48 mmHg   pO2, Arterial 92 83 - 108 mmHg   Bicarbonate 27.5 20.0 - 28.0 mmol/L   TCO2 29 22 - 32 mmol/L   O2 Saturation 96 %   Acid-Base Excess 0.0 0.0 - 2.0 mmol/L   Sodium 143 135 - 145 mmol/L   Potassium 4.0 3.5 - 5.1 mmol/L   Calcium , Ion 1.20 1.15 - 1.40 mmol/L   HCT 26.0 (L) 39.0 - 52.0 %   Hemoglobin 8.8 (L) 13.0 - 17.0 g/dL   Patient temperature 00.7 F    Sample type ARTERIAL   Glucose, capillary     Status: Abnormal   Collection Time: 04/27/24 11:10 AM  Result Value Ref Range   Glucose-Capillary 184 (H) 70 - 99 mg/dL    Comment: Glucose reference range applies only to samples taken after fasting for at least 8 hours.  Glucose, capillary     Status: Abnormal   Collection Time: 04/27/24  3:11 PM  Result Value Ref Range   Glucose-Capillary 163 (H) 70 - 99 mg/dL    Comment: Glucose reference range applies only to samples taken after fasting for at least 8 hours.  Basic metabolic panel with GFR     Status: Abnormal   Collection Time: 04/27/24  3:37 PM  Result Value Ref Range   Sodium 143 135 -  145 mmol/L   Potassium 4.0 3.5 - 5.1 mmol/L    Comment: HEMOLYSIS AT THIS LEVEL MAY AFFECT RESULT   Chloride  106 98 - 111 mmol/L   CO2 24 22 - 32 mmol/L   Glucose, Bld 180 (H) 70 - 99 mg/dL    Comment: Glucose reference range applies only to samples taken after fasting for at least 8 hours.   BUN 89 (H) 8 - 23 mg/dL   Creatinine, Ser 7.84 (H) 0.61 - 1.24 mg/dL   Calcium  8.5 (L) 8.9 - 10.3 mg/dL   GFR, Estimated 28 (L) >60 mL/min    Comment: (NOTE) Calculated using the CKD-EPI Creatinine Equation (2021)    Anion gap 12 5 - 15    Comment: Performed at [facility], [address], [place], [place] [address]  Glucose, capillary     Status: Abnormal   Collection Time: 04/27/24  7:27 PM  Result Value Ref Range   Glucose-Capillary 140 (H) 70 - 99 mg/dL    Comment: Glucose reference range applies only to samples taken after fasting for at least 8 hours.  Glucose, capillary     Status: Abnormal   Collection Time: 04/27/24 11:19 PM  Result Value Ref Range   Glucose-Capillary 124 (H) 70 - 99 mg/dL    Comment: Glucose reference range applies only to samples taken after fasting for at least 8 hours.  Glucose, capillary     Status: Abnormal   Collection Time: 04/28/24  3:15 AM  Result Value Ref Range   Glucose-Capillary 151 (H) 70 - 99 mg/dL    Comment: Glucose reference range applies only to samples taken after fasting for at least 8 hours.  CBC     Status: Abnormal   Collection Time: 04/28/24  3:44 AM  Result Value Ref Range   WBC 16.7 (H) 4.0 - 10.5 K/uL   RBC 2.62 (L) 4.22 - 5.81 MIL/uL   Hemoglobin 8.2 (L) 13.0 - 17.0 g/dL   HCT 73.6 (L) 60.9 - 47.9 %   MCV 100.4 (H) 80.0 - 100.0 fL   MCH 31.3 26.0 - 34.0 pg   MCHC 31.2 30.0 - 36.0 g/dL   RDW 80.0 (H) 88.4 - 84.4 %   Platelets 145 (L) 150 - 400 K/uL   nRBC 2.6 (H) 0.0 - 0.2 %    Comment: Performed at [facility], [address], [place], [place] [address]  Basic metabolic panel with GFR     Status: Abnormal   Collection Time: 04/28/24  5:24 AM  Result Value Ref Range   Sodium 143 135 - 145 mmol/L   Potassium 4.7 3.5 - 5.1  mmol/L    Comment: HEMOLYSIS AT THIS LEVEL MAY AFFECT RESULT   Chloride 106 98 - 111 mmol/L   CO2 24 22 - 32 mmol/L   Glucose, Bld 128 (H) 70 - 99 mg/dL    Comment: Glucose reference range applies only to samples taken after fasting for at least 8 hours.   BUN 102 (H) 8 - 23 mg/dL   Creatinine, Ser 7.34 (H) 0.61 - 1.24 mg/dL   Calcium  8.6 (L) 8.9 - 10.3 mg/dL   GFR, Estimated 22 (L) >60 mL/min    Comment: (NOTE) Calculated using the CKD-EPI Creatinine Equation (2021)    Anion gap 13 5 - 15    Comment: Performed at [facility], [address] [place], [place] [address]  Magnesium      Status: None   Collection Time:  04/28/24  5:24 AM  Result Value Ref Range   Magnesium  2.4 1.7 - 2.4 mg/dL    Comment: Performed at [facility], [address], [place], [place] [address]  Phosphorus     Status: None   Collection Time: 04/28/24  5:24 AM  Result Value Ref Range   Phosphorus 4.0 2.5 - 4.6 mg/dL    Comment: Performed at [facility], [address], [place], [place] [address]  Glucose, capillary     Status: Abnormal   Collection Time: 04/28/24  7:18 AM  Result Value Ref Range   Glucose-Capillary 128 (H) 70 - 99 mg/dL    Comment: Glucose reference range applies only to samples taken after fasting for at least 8 hours.  Culture, Respiratory w Gram Stain     Status: None (Preliminary result)   Collection Time: 04/28/24  9:02 AM   Specimen: Tracheal Aspirate; Respiratory  Result Value Ref Range   Specimen Description TRACHEAL ASPIRATE    Special Requests NONE    Gram Stain      RARE WBC PRESENT, PREDOMINANTLY PMN FEW GRAM VARIABLE ROD RARE GRAM POSITIVE COCCI IN PAIRS Performed at [facility], [address] [place], [place] [address]    Culture PENDING    Report Status PENDING   Respiratory (~20 pathogens) panel by PCR     Status: None   Collection Time: 04/28/24  9:52 AM   Specimen: Nasopharyngeal Swab; Respiratory  Result Value Ref Range   Adenovirus  NOT DETECTED NOT DETECTED   Coronavirus 229E NOT DETECTED NOT DETECTED    Comment: (NOTE) The Coronavirus on the Respiratory Panel, DOES NOT test for the novel  Coronavirus (2019 nCoV)    Coronavirus HKU1 NOT DETECTED NOT DETECTED   Coronavirus NL63 NOT DETECTED NOT DETECTED   Coronavirus OC43 NOT DETECTED NOT DETECTED   Metapneumovirus NOT DETECTED NOT DETECTED   Rhinovirus / Enterovirus NOT DETECTED NOT DETECTED   Influenza A NOT DETECTED NOT DETECTED   Influenza B NOT DETECTED NOT DETECTED   Parainfluenza Virus 1 NOT DETECTED NOT DETECTED   Parainfluenza Virus 2 NOT DETECTED NOT DETECTED   Parainfluenza Virus 3 NOT DETECTED NOT DETECTED   Parainfluenza Virus 4 NOT DETECTED NOT DETECTED   Respiratory Syncytial Virus NOT DETECTED NOT DETECTED   Bordetella pertussis NOT DETECTED NOT DETECTED   Bordetella Parapertussis NOT DETECTED NOT DETECTED   Chlamydophila pneumoniae NOT DETECTED NOT DETECTED   Mycoplasma pneumoniae NOT DETECTED NOT DETECTED    Comment: Performed at [facility], [address] [place], [place] [address]  Glucose, capillary     Status: Abnormal   Collection Time: 04/28/24 11:49 AM  Result Value Ref Range   Glucose-Capillary 150 (H) 70 - 99 mg/dL    Comment: Glucose reference range applies only to samples taken after fasting for at least 8 hours.  Glucose, capillary     Status: Abnormal   Collection Time: 04/28/24  3:48 PM  Result Value Ref Range   Glucose-Capillary 161 (H) 70 - 99 mg/dL    Comment: Glucose reference range applies only to samples taken after fasting for at least 8 hours.    DG Chest Port 1 View Result Date: 04/28/2024 CLINICAL DATA:  Central line placement. EXAM: PORTABLE CHEST 1 VIEW COMPARISON:  04/28/2024 and CT chest 04/17/2024. FINDINGS: Endotracheal tube terminates 3.5 cm above the carina. Feeding tube is followed into the stomach with the tip projecting beyond the inferior margin of the image.  Right PICC tip is at the  brachiocephalic vein junction. New right IJ central line tip is in the SVC. Heart is enlarged, stable. Thoracic aorta is calcified. Diffuse bilateral mixed interstitial and airspace opacification in a craniocaudal gradient. Bilateral pleural effusions. No pneumothorax. IMPRESSION: 1. Right IJ central line placement without pneumothorax. 2. Congestive heart failure. Electronically Signed   By: Newell Eke M.D.   On: 04/28/2024 12:53   DG Chest Port 1 View Result Date: 04/28/2024 CLINICAL DATA:  Shortness of breath. EXAM: PORTABLE CHEST 1 VIEW COMPARISON:  04/25/2024 FINDINGS: Low volume film. The cardio pericardial silhouette is enlarged. Diffuse interstitial with mid and lower lung predominant airspace opacity is compatible with edema. Layering small bilateral pleural effusions evident. Endotracheal tube tip is 2.9 cm above the base of the carina. A feeding tube passes into the stomach although the distal tip position is not included on the film. Right PICC line tip overlies the region of the innominate vein confluence. Telemetry leads overlie the chest. IMPRESSION: 1. Low volume film with diffuse interstitial and mid / lower lung predominant airspace opacity compatible with edema. 2. Layering small bilateral pleural effusions. Electronically Signed   By: Camellia Candle M.D.   On: 04/28/2024 08:10    Assessment/Plan  AKI and volume overload: - making urine but not enough to make headway with volume unloading - will do CRRT- all 4K, some heparin -- blood pretty coagulable during line placement per PCCM- they are OK with this - time- limited trial- no more than 72 hrs  2.  Critical polytrauma  - after falling off deck on christmas  - per trauma team  3.  Afib with RVR  - hopefully volume unloading will improve this  - on amio  4.  Acute hypoxic RF:  - volume mediates some  - hoepfully we can get some fluid off to give him the best chance at extubation  5.  Dispo:  - In ICU  Aliah Eriksson,  Marda Breidenbach 04/28/2024, 4:02 PM       [1]  Allergies Allergen Reactions   Bee Venom Anaphylaxis   Doxycycline  Hyclate Anaphylaxis   Yellow Jacket Venom Anaphylaxis   Atorvastatin Other (See Comments)   Doxycycline  Hyclate     Other Reaction(s): tachycardia/afib   Finasteride     Other Reaction(s): weak urinary stream   Sulfamethoxazole     Other Reaction(s): rash/tachycardia/hypotension   Bactrim [Sulfamethoxazole-Trimethoprim] Hives and Rash   Rivaroxaban  Rash

## 2024-04-28 NOTE — Progress Notes (Signed)
 Nutrition Follow-up  DOCUMENTATION CODES:   Not applicable  INTERVENTION:   Tube feeding via Cortrak: Decrease Pivot 1.5 to 50 ml/h (1200 ml per day)  Add Prosource TF20 60 ml TID  Provides 2040 kcal, 172 gm protein, 912 ml free water  daily    NUTRITION DIAGNOSIS:   Increased nutrient needs related to  (trauma) as evidenced by estimated needs. Ongoing.   GOAL:   Patient will meet greater than or equal to 90% of their needs Progressing with TF adjustments   MONITOR:   I & O's  REASON FOR ASSESSMENT:   Consult  (CRRT)  ASSESSMENT:   Pt with PMH of Afib on Eliquis , cardiomegaly, GERD, hiatal hernia, HLD, HTN, OA, and vitamin D  deficiency who was admitted after falling off the roof with R femur fx, SDH/SAH, nondepressed parietoccipital bone fxs, bilateral rib fxs, thoracic TP fxs, possible T9-10 injury, and L psoas hemorrhage.  Pt discussed during ICU rounds and with RN and MD.  Trauma and CCM following, per notes pt with worsening multisystem organ failure, family would like to try anything that would help, therefore starting CRRT today.   12/26 - s/p cortrak placement, gastric 12/28 - respiratory and neurologic decline after being laid flat for CT 12/29 - OR, repair of right femur fractures   Medications reviewed and include: colace, SSI every 4 hours, 8 units semglee  daily, synthroid , protonix , miralax  Amiodarone  Precedex  Levophed  @ 6 mcg Propofol  @ 3.4 ml/hr providing minimal kcal  Vasopressin  @ 0.03 unis  Labs reviewed:  K 4.7 CBG's: 128-151 Phos 4.0 Mag 2.4 A1C 5.4  UOP 3235 ml  I&O +25,454 ml since admission +328 ml x 24 hours  Current weight: 116 kg Admission weight: 89.4 kg Per RN pt with very deep pitting   Diet Order:   Diet Order             Diet NPO time specified  Diet effective midnight                   EDUCATION NEEDS:   Not appropriate for education at this time  Skin:  Skin Assessment: Reviewed RN Assessment  Last  BM:  1/3  Height:   Ht Readings from Last 1 Encounters:  04/25/24 5' 11 (1.803 m)    Weight:   Wt Readings from Last 1 Encounters:  04/28/24 116 kg    Ideal Body Weight:  64.5 kg  BMI:  Body mass index is 35.67 kg/m.  Estimated Nutritional Needs:   Kcal:  1800-2000  Protein:  156-195 grams  Fluid:  > 1.8 L/day  Powell SQUIBB., RD, LDN, CNSC See AMiON for contact information

## 2024-04-28 NOTE — Procedures (Deleted)
 Extubation Procedure Note  Patient Details:   Name: TERIK HAUGHEY DOB: 01/04/1930 MRN: [id number]   Airway Documentation:    Vent end date: 04/28/24 Vent end time: 1158   Evaluation  O2 sats: stable throughout Complications: No apparent complications Patient did tolerate procedure well. Bilateral Breath Sounds: Rhonchi   Yes Pt ext to 3L [place] per MD order. Positive cuff leak noted.  Leontine Murtis GAILS 04/28/2024, 11:58 AM

## 2024-04-28 NOTE — Progress Notes (Signed)
 Patient ID: Tom Norton, male   DOB: 1929-07-07, 89 y.o.   MRN: [id number] Follow up - Trauma Critical Care   Patient Details:    Tom Norton is an 89 y.o. male.  Lines/tubes : Airway 7.5 mm (Active)  Secured at (cm) 26 cm 04/28/24 0752  Measured From Lips 04/28/24 0752  Secured Location Center 04/28/24 0752  Secured By [employer] 04/28/24 0752  Bite Block Yes 04/28/24 0752  Tube Holder Repositioned Yes 04/28/24 0752  Prone position No 04/28/24 0752  Cuff Pressure (cm H2O) Clear OR 27-39 CmH2O 04/27/24 1938  Site Condition Dry 04/28/24 0752     PICC Triple Lumen 04/17/24 Right Brachial 40 cm 0 cm (Active)  Indication for Insertion or Continuance of Line Vasoactive infusions 04/28/24 0758  Exposed Catheter (cm) 0 cm 04/17/24 2326  Site Assessment Clean, Dry, Intact 04/28/24 0758  Lumen #1 Status Infusing;Flushed 04/28/24 0758  Lumen #2 Status Infusing;Flushed 04/28/24 0758  Lumen #3 Status In-line blood sampling system in place 04/28/24 0758  Dressing Type Transparent 04/28/24 0758  Dressing Status Antimicrobial disc/dressing in place 04/28/24 0758  Line Care Connections checked and tightened;Line pulled back;Leveled 04/27/24 0800  Line Adjustment (NICU/IV Team Only) No 04/21/24 0815  Dressing Intervention Dressing changed 04/25/24 0600  Dressing Change Due 05/02/24 04/28/24 0758     Arterial Line 04/23/24 Left Radial (Active)  Site Assessment Clean, Dry, Intact 04/28/24 0758  Line Status Pulsatile blood flow 04/28/24 0758  Art Line Waveform Appropriate;Square wave test performed 04/28/24 0758  Art Line Interventions Zeroed and calibrated;Connections checked and tightened 04/28/24 0758  Color/Movement/Sensation Capillary refill less than 3 sec 04/28/24 0758  Dressing Type Transparent;Securing device 04/28/24 0758  Dressing Status Clean, Dry, Intact 04/28/24 0758  Dressing Change Due 04/30/24 04/28/24 0758     Negative Pressure Wound Therapy Knee  Anterior;Left (Active)     Urethral Catheter [name], RN Latex 16 Fr. (Active)  Indication for Insertion or Continuance of Catheter Therapy based on hourly urine output monitoring and documentation for critical condition (NOT STRICT I&O) 04/28/24 0751  Site Assessment Clean, Dry, Intact 04/28/24 0751  Catheter Maintenance Bag below level of bladder;Catheter secured;Drainage bag/tubing not touching floor;Insertion date on drainage bag;No dependent loops;Seal intact 04/28/24 0751  Collection Container Standard drainage bag 04/28/24 0751  Securement Method Adhesive securement device 04/28/24 0751  Urinary Catheter Interventions (if applicable) Unclamped 04/28/24 0751  Output (mL) 75 mL 04/28/24 0600    Microbiology/Sepsis markers: Results for orders placed or performed during the hospital encounter of 04/17/24  MRSA Next Gen by PCR, Nasal     Status: None   Collection Time: 04/17/24  8:06 PM   Specimen: Nasal Mucosa; Nasal Swab  Result Value Ref Range Status   MRSA by PCR Next Gen NOT DETECTED NOT DETECTED Final    Comment: (NOTE) The GeneXpert MRSA Assay (FDA approved for NASAL specimens only), is one component of a comprehensive MRSA colonization surveillance program. It is not intended to diagnose MRSA infection nor to guide or monitor treatment for MRSA infections. Test performance is not FDA approved in patients less than 41 years old. Performed at [facility], [address], [place], [place] [address]   Culture, Respiratory w Gram Stain     Status: None   Collection Time: 04/22/24  1:08 PM   Specimen: Tracheal Aspirate; Respiratory  Result Value Ref Range Status   Specimen Description TRACHEAL ASPIRATE  Final   Special Requests NONE  Final   Gram  Stain   Final    FEW WBC PRESENT,BOTH PMN AND MONONUCLEAR MODERATE GRAM NEGATIVE RODS FEW GRAM POSITIVE COCCI RARE GRAM POSITIVE RODS Performed at [facility], [address], [place], [place]  [address]    Culture MODERATE KLEBSIELLA AEROGENES  Final   Report Status 04/24/2024 FINAL  Final   Organism ID, Bacteria KLEBSIELLA AEROGENES  Final      Susceptibility   Klebsiella aerogenes - MIC*    CEFEPIME  <=0.12 SENSITIVE Sensitive     CEFTRIAXONE 32 RESISTANT Resistant     CIPROFLOXACIN <=0.06 SENSITIVE Sensitive     GENTAMICIN <=1 SENSITIVE Sensitive     MEROPENEM  <=0.25 SENSITIVE Sensitive     TRIMETH/SULFA <=20 SENSITIVE Sensitive     PIP/TAZO Value in next row Intermediate      64 INTERMEDIATEThis is a modified FDA-approved test that has been validated and its performance characteristics determined by the reporting laboratory.  This laboratory is certified under the Clinical Laboratory Improvement Amendments CLIA as qualified to perform high complexity clinical laboratory testing.    * MODERATE KLEBSIELLA AEROGENES    Anti-infectives:  Anti-infectives (From admission, onward)    Start     Dose/Rate Route Frequency Ordered Stop   04/22/24 1115  ceFEPIme  (MAXIPIME ) 2 g in sodium chloride  0.9 % 100 mL IVPB        2 g 200 mL/hr over 30 Minutes Intravenous Every 12 hours 04/22/24 1024 04/28/24 2359   04/20/24 2045  ceFAZolin  (ANCEF ) IVPB 2g/100 mL premix       Note to Pharmacy: Anesthesia to give preop   2 g 200 mL/hr over 30 Minutes Intravenous  Once 04/20/24 1948 04/20/24 2053       Consults: Treatment Team:  Darnella Dorn SAUNDERS, MD    Studies:    Events:  Subjective:    Overnight Issues: worsening resp failure, on levo and vaso as well  Objective:  Vital signs for last 24 hours: Temp:  [98.1 F (36.7 C)-99.7 F (37.6 C)] 98.4 F (36.9 C) (01/05 0800) Pulse Rate:  [53-114] 61 (01/05 0700) Resp:  [16-28] 21 (01/05 0700) BP: (96-124)/(55-83) 115/57 (01/05 0700) SpO2:  [84 %-100 %] 93 % (01/05 0700) Arterial Line BP: (110-146)/(44-87) 126/64 (01/05 0700) FiO2 (%):  [70 %-100 %] 70 % (01/05 0752) Weight:  [883 kg] 116 kg (01/05 0336)  Hemodynamic  parameters for last 24 hours:    Intake/Output from previous day: 01/04 0701 - 01/05 0700 In: 3563 [I.V.:1687.8; NG/GT:1420; IV Piggyback:455.2] Out: 3235 [Urine:3235]  Intake/Output this shift: Total I/O In: 115.7 [I.V.:60.7; NG/GT:55] Out: -   Vent settings for last 24 hours: Vent Mode: PRVC FiO2 (%):  [70 %-100 %] 70 % Set Rate:  [20 bmp] 20 bmp Vt Set:  [560 mL] 560 mL PEEP:  [8 cmH20] 8 cmH20 Plateau Pressure:  [20 cmH20-30 cmH20] 30 cmH20  Physical Exam:  General: sedated on vent Neuro: sedated/ntot F/C Resp: some rales CVS: IRR GI: soft.NT ND Extremities: 3+edema  Results for orders placed or performed during the hospital encounter of 04/17/24 (from the past 24 hours)  I-STAT 7, (LYTES, BLD GAS, ICA, H+H)     Status: Abnormal   Collection Time: 04/27/24  8:55 AM  Result Value Ref Range   pH, Arterial 7.292 (L) 7.35 - 7.45   pCO2 arterial 57.1 (H) 32 - 48 mmHg   pO2, Arterial 92 83 - 108 mmHg   Bicarbonate 27.5 20.0 - 28.0 mmol/L   TCO2 29 22 - 32 mmol/L  O2 Saturation 96 %   Acid-Base Excess 0.0 0.0 - 2.0 mmol/L   Sodium 143 135 - 145 mmol/L   Potassium 4.0 3.5 - 5.1 mmol/L   Calcium , Ion 1.20 1.15 - 1.40 mmol/L   HCT 26.0 (L) 39.0 - 52.0 %   Hemoglobin 8.8 (L) 13.0 - 17.0 g/dL   Patient temperature 00.7 F    Sample type ARTERIAL   Glucose, capillary     Status: Abnormal   Collection Time: 04/27/24 11:10 AM  Result Value Ref Range   Glucose-Capillary 184 (H) 70 - 99 mg/dL  Glucose, capillary     Status: Abnormal   Collection Time: 04/27/24  3:11 PM  Result Value Ref Range   Glucose-Capillary 163 (H) 70 - 99 mg/dL  Basic metabolic panel with GFR     Status: Abnormal   Collection Time: 04/27/24  3:37 PM  Result Value Ref Range   Sodium 143 135 - 145 mmol/L   Potassium 4.0 3.5 - 5.1 mmol/L   Chloride 106 98 - 111 mmol/L   CO2 24 22 - 32 mmol/L   Glucose, Bld 180 (H) 70 - 99 mg/dL   BUN 89 (H) 8 - 23 mg/dL   Creatinine, Ser 7.84 (H) 0.61 - 1.24 mg/dL    Calcium  8.5 (L) 8.9 - 10.3 mg/dL   GFR, Estimated 28 (L) >60 mL/min   Anion gap 12 5 - 15  Glucose, capillary     Status: Abnormal   Collection Time: 04/27/24  7:27 PM  Result Value Ref Range   Glucose-Capillary 140 (H) 70 - 99 mg/dL  Glucose, capillary     Status: Abnormal   Collection Time: 04/27/24 11:19 PM  Result Value Ref Range   Glucose-Capillary 124 (H) 70 - 99 mg/dL  Glucose, capillary     Status: Abnormal   Collection Time: 04/28/24  3:15 AM  Result Value Ref Range   Glucose-Capillary 151 (H) 70 - 99 mg/dL  CBC     Status: Abnormal   Collection Time: 04/28/24  3:44 AM  Result Value Ref Range   WBC 16.7 (H) 4.0 - 10.5 K/uL   RBC 2.62 (L) 4.22 - 5.81 MIL/uL   Hemoglobin 8.2 (L) 13.0 - 17.0 g/dL   HCT 73.6 (L) 60.9 - 47.9 %   MCV 100.4 (H) 80.0 - 100.0 fL   MCH 31.3 26.0 - 34.0 pg   MCHC 31.2 30.0 - 36.0 g/dL   RDW 80.0 (H) 88.4 - 84.4 %   Platelets 145 (L) 150 - 400 K/uL   nRBC 2.6 (H) 0.0 - 0.2 %  Basic metabolic panel with GFR     Status: Abnormal   Collection Time: 04/28/24  5:24 AM  Result Value Ref Range   Sodium 143 135 - 145 mmol/L   Potassium 4.7 3.5 - 5.1 mmol/L   Chloride 106 98 - 111 mmol/L   CO2 24 22 - 32 mmol/L   Glucose, Bld 128 (H) 70 - 99 mg/dL   BUN 897 (H) 8 - 23 mg/dL   Creatinine, Ser 7.34 (H) 0.61 - 1.24 mg/dL   Calcium  8.6 (L) 8.9 - 10.3 mg/dL   GFR, Estimated 22 (L) >60 mL/min   Anion gap 13 5 - 15  Magnesium      Status: None   Collection Time: 04/28/24  5:24 AM  Result Value Ref Range   Magnesium  2.4 1.7 - 2.4 mg/dL  Phosphorus     Status: None   Collection Time: 04/28/24  5:24 AM  Result Value Ref Range   Phosphorus 4.0 2.5 - 4.6 mg/dL  Glucose, capillary     Status: Abnormal   Collection Time: 04/28/24  7:18 AM  Result Value Ref Range   Glucose-Capillary 128 (H) 70 - 99 mg/dL    Assessment & Plan: Present on Admission:  Critical polytrauma    LOS: 11 days   Additional comments:I reviewed the patient's new clinical lab  test results. And CXR Injuries: - Right femur fracture - SDH/SAH - Nondepressed parietoocipital bone fracture - Trace left basilar pneumothorax - Bilateral rib fractures - Thoracic TP fractures - Possible T9-10 discoligamentous injury - Left psoas hemorrhage without extravasation  Plan: - Appreciate neurosurgery recommendations, Dr. Garst              - MRI thoracic spine done 1/2 - NS plan pending  - Repeat [facility] 12/28 with stable ICH, but new occipital stroke, Dr. Merrianne consulted. MRI brain also done 1/2 - follow neuro exam  - Appreciate orthopaedics recommendations, Dr. Jerri, Dr. Kendal - ORIF right femur 12/29 (Dr. Jerri)  - Acute blood loss anemia   - Shock - likely hemorrhagic initially and now cardiogenic (CHF, afib s/p cardioversion and now amio), appreciate CCM management   - acute hypoxic ventilator dependent respiratory failure - was weaning on Saturday and on basic settings yesterday AM. Hope was to diurese further and try to extubate today. Unfortunately, his respiratory failure has worsened significantly. Appreciate CCM management  - ID - cefepime  for klebsiella PNA, re-CX with worsening pulm function, discuss broadening ABX with CCM  - FEN - strict NPO, cortrak  - DVT - SCDs, SQ heparin  starting today  - Dispo - ICU, I met with Doug's wife and son at the bedside. I outlined his worsening multisystem organ failure (neuro, CV on 2 pressors and midodrine , pulmonary 70% and PEEP 8, worsening renal function despite diureses) and the fact he is getting worse despite all of our efforts. His son and wife remain hopeful and are interested in CRRT to help remove fluid. I went back with Dr. Claudene and met with them again. We expressed that, unfortunately, it is very unlikely Georgette will progress above bed-bound level even if we can liberate him from mechanical ventilation. They express they want to try anything that might help, including CRRT. Will plan that today.  Appreciate CCM  collaboration  Critical Care Total Time*: 42 Minutes  Dann Hummer, MD, MPH, FACS Trauma & General Surgery Use [email or web address] to contact on call provider  04/28/2024  *Care during the described time interval was provided by me. I have reviewed this patient's available data, including medical history, events of note, physical examination and test results as part of my evaluation.

## 2024-04-28 NOTE — Progress Notes (Signed)
 "  NAME:  Tom Norton, MRN:  [id number], DOB:  [birth year], LOS: 11 ADMISSION DATE:  04/17/2024, CONSULTATION DATE: 04/22/2024 REFERRING MD:  Trauma, CHIEF COMPLAINT:  Shock   History of Present Illness:  89 year old man admitted as polytrauma after fall from deck with multiple orthopedic injuries, fracture, whom we are consulted for hypoxemia and shock.  Patient was admitted Christmas Day after fall from deck per report.  Initial images reveal left-sided rib fractures, right femur fracture, possible iliac fractures, left posterior subdural hematoma no midline shift, small left posterior parafalcine subdural hematoma, traumatic subarachnoid hemorrhage, skull fracture, trace left apical pneumothorax.  Chest x-ray 12/28 demonstrated left-sided atelectasis and small pleural effusion on my review interpretation.  He went for femur repair 12/29.  He named and remains intubated after procedure.  Chest x-ray 12/29 reveals ET tube placement, similar right-sided infiltrates and atelectasis, new rounded left-sided infiltrate on my review interpretation.  He developed shock now on maximum dose norepinephrine , vasopressin , started on phenylephrine  overnight.  He remains intubated.  Minimal vent settings.  He is on Precedex  for sedation.  Weaning Precedex .  Blood pressure get a bit better.  Review of labs reveal serial low hemoglobins.  He is getting a transfusion today.  He has had transfusions earlier this admission.  Pertinent Medical History:   Past Medical History:  Diagnosis Date   BPH (benign prostatic hyperplasia)    Cardiomegaly    Dilated aortic root    a. 09/2013: 4.2cm by echo.   Dysrhythmia    Enlarged prostate without lower urinary tract symptoms (luts)    GERD (gastroesophageal reflux disease)    rare   History of hiatal hernia    Hyperlipidemia    Hypertension    Hypothyroidism    Insomnia    Junctional bradycardia    OA (osteoarthritis)    Obesity    Paroxysmal A-fib (HCC)    a.  09/23/13 a-fib with RVR and hypotensive, requiring emergent cardioversion in ED   Peripheral neuropathy 07/17/2017   Vitamin D  deficiency    Significant Hospital Events: Including procedures, antibiotic start and stop dates in addition to other pertinent events   12/25 admitted after fall 12/29 OR fro femur fracture repair  12/30 PCCM consult, AFIB RVR overnight  Interim History / Subjective:  No significant events Worsening renal function and not much luck with diuresis Planning for CRRT initiation per family wishes Nephro consulted  Objective:   Blood pressure (!) 115/57, pulse 61, temperature 98.1 F (36.7 C), temperature source Axillary, resp. rate (!) 21, height 5' 11 (1.803 m), weight 116 kg, SpO2 93%.    Vent Mode: PRVC FiO2 (%):  [70 %-100 %] 70 % Set Rate:  [20 bmp] 20 bmp Vt Set:  [560 mL] 560 mL PEEP:  [8 cmH20] 8 cmH20 Plateau Pressure:  [20 cmH20-30 cmH20] 30 cmH20   Intake/Output Summary (Last 24 hours) at 04/28/2024 0732 Last data filed at 04/28/2024 0700 Gross per 24 hour  Intake 3562.97 ml  Output 3235 ml  Net 327.97 ml   Filed Weights   04/26/24 0500 04/27/24 0500 04/28/24 0336  Weight: 117.3 kg 116.7 kg 116 kg   Physical Examination: General: Acutely ill-appearing elderly man in NAD. HEENT: [place]/AT, anicteric sclera, PERRL 2mm, moist mucous membranes. Neck: RIJ Trialysis catheter in place. Neuro: Intubated, sedated. Does not respond to verbal, tactile or noxious stimuli. Not following commands. Moves BUE spontaneously with sedation weaned. +Corneal, +Cough, and +Gag  CV: RRR, no m/g/r. PULM: Breathing even and unlabored  on vent (PEEP 8, FiO2 60%). Lung fields diminished bilaterally at bases. GI: Soft, nontender, nondistended. Normoactive bowel sounds. Extremities: Bilateral symmetric pitting 2+ UE/LE edema noted, grossly anasarcic. Skin: Warm/damp, no rashes.   amiodarone  30 mg/hr (04/28/24 0700)   ceFEPime  (MAXIPIME ) IV Stopped (04/27/24 2221)    dexmedetomidine  (PRECEDEX ) IV infusion 0.8 mcg/kg/hr (04/28/24 0700)   norepinephrine  (LEVOPHED ) Adult infusion 8 mcg/min (04/28/24 0700)   propofol  (DIPRIVAN ) infusion 5 mcg/kg/min (04/28/24 0700)   vasopressin  0.03 Units/min (04/28/24 0700)   Patient Lines/Drains/Airways Status     Active Line/Drains/Airways     Name Placement date Placement time Site Days   Arterial Line 04/23/24 Left Radial 04/23/24  0240  Radial  5   Peripheral IV 04/17/24 20 G Anterior;Distal;Left Forearm 04/17/24  1849  Forearm  11   PICC Triple Lumen 04/17/24 Right Brachial 40 cm 0 cm 04/17/24  2320  -- 11   Negative Pressure Wound Therapy Knee Anterior;Left 02/18/21  1708  --  1165   Urethral Catheter [name], RN Latex 16 Fr. 04/18/24  0000  Latex  10   Airway 7.5 mm 04/21/24  1211  -- 7   Small Bore Feeding Tube 10 Fr. Left nare Marking at nare/corner of mouth 61 cm 04/18/24  1610  Left nare  10   Wound 04/21/24 1333 Surgical Closed Surgical Incision Hip Right 04/21/24  1333  Hip  7   Wound 04/26/24 0803 Atypical Pretibial Right;Lateral;Lower 04/26/24  0803  Pretibial  2   Wound 04/26/24 0807 Atypical Pretibial Right;Anterior 04/26/24  0807  Pretibial  2   Wound 04/26/24 0807 Pretibial Distal;Right;Anterior 04/26/24  0807  Pretibial  2   Wound 04/26/24 0807 Ankle Anterior;Right 04/26/24  0807  Ankle  2           Assessment and Plan:   Polytrauma (TBI, rib fx, spinal fx, psoas hematoma, R femur fx post ORIF) Type 1 resp failure related now mostly to massive volume overload with diuresis limited by hemodynamics Ongoing vasoplegia Klebsiella HCAP on cefepime  x 7 days Ongoing ABLA query phlebotomy and critical illness- last transfusion 04/27/23 Afib/RVR relatedd to atrial stretch, improved with amio  - Goal MAP > 65 - Levophed  titrated to goal MAP + vasopressin  - Continue midodrine  - Trend WBC, fever curve - F/u Cx data - Continue broad-spectrum antibiotics (cefepime , end today 1/5) -  Continue full vent support (4-8cc/kg IBW) - Wean FiO2 for O2 sat > 90% - Daily WUA/SBT - VAP bundle - Pulmonary hygiene - PAD protocol for sedation: Precedex  and Fentanyl  for goal RASS 0 to -1 - Unfortunately suboptimal diuresis - Nephro consulted, CRRT to be initiated today - Trend BMP - Replete electrolytes as indicated - Avoid nephrotoxic agents as able - Unna boots, WOC following - Polytrauma per Trauma team  Critical care time:   The patient is critically ill with multiple organ system failure and requires high complexity decision making for assessment and support, frequent evaluation and titration of therapies, advanced monitoring, review of radiographic studies and interpretation of complex data.   Critical Care Time devoted to patient care services, exclusive of separately billable procedures, described in this note is 36 minutes.  Corean CHRISTELLA Jatziry Wechter, PA-C [facility] 04/28/2024 7:34 AM  Please see [email or web address] for pager details.  From 7A-7P if no response, please call [phone] After hours, please call ELink [phone] "

## 2024-04-28 NOTE — Progress Notes (Signed)
 Patient ID: RALLY OUCH, male   DOB: Nov 04, 1929, 89 y.o.   MRN: [id number] Prelim duplex +DVT. I D/W Dr. Darnella. Will need F/U CT head prior to full anticoagulation. Not stable for CT yet. I updated the family.  Dann Hummer, MD, MPH, FACS Please use [email or web address] to contact on call provider

## 2024-04-28 NOTE — Progress Notes (Signed)
 " Daily Progress Note   Date: 04/28/2024   Patient Name: Tom Norton  DOB: 1929/06/19  MRN: [id number]  Age / Sex: 89 y.o., male  Attending Physician: Rolla Seltzer, MD Primary Care Physician: Charlott Dorn LABOR, MD Admit Date: 04/17/2024 Length of Stay: 11 days  Reason for Follow-up: Establishing goals of care  Past Medical History:  Diagnosis Date   BPH (benign prostatic hyperplasia)    Cardiomegaly    Dilated aortic root    a. 09/2013: 4.2cm by echo.   Dysrhythmia    Enlarged prostate without lower urinary tract symptoms (luts)    GERD (gastroesophageal reflux disease)    rare   History of hiatal hernia    Hyperlipidemia    Hypertension    Hypothyroidism    Insomnia    Junctional bradycardia    OA (osteoarthritis)    Obesity    Paroxysmal A-fib (HCC)    a. 09/23/13 a-fib with RVR and hypotensive, requiring emergent cardioversion in ED   Peripheral neuropathy 07/17/2017   Vitamin D  deficiency     Assessment & Plan:   HPI/Patient Profile:   89 y.o. male  with past medical history of HLD, HTN, osteoarthritis, paroxysmal atrial fibrillation on eliquis  admitted on 04/17/2024  with SDH, SAH, and right femur fracture 2/2 fall from balcony. CT head 04/17/2024 w/o contrast demonstrated left posterior SDH up to 9 mm thickness, multifocal bilateral SAH, linear skull fracture of left parietooccipital bone, right posterior scalp hematoma. Repeat CT head on 04/18/2024 demonstrate increase in multifocal hemorrhage, now bilateral SDH, increased SAH volume, minimal midline shift has increased. Per neurosurgery note by Darnella MD on 04/17/2024 patient not a candidate for hematoma evacuation surgery if his hemorrhages were to expand.    Palliative medicine consulted for goals of care conversation.  SUMMARY OF RECOMMENDATIONS  Full code, full scope Palliative medicine team will continue to follow for outcomes and further goals of care conversation Healthcare power of attorney in order Tom Norton, Tom Norton, Tom Norton)  Symptom Management:  Per primary team  Code Status: Full Code  Prognosis: Unable to determine  Discharge Planning: To Be Determined  Discussed with: Tom Sharps, MD, Tom Reese, PA-C about the wife's reluctance to continue discussion of CODE STATUS.  Subjective:   Subjective: Chart Reviewed. Updates received. Patient Assessed. Created space and opportunity for patient  and family to explore thoughts and feelings regarding current medical situation.  Patient is now back on pressors compared to prior visit on 04/25/2024.  Currently on vasopressin  0.03 units/min, and 2 mcg/min of norepinephrine .  Starting CRRT today 04/28/2024.   Today's Discussion:  Met with the patient and wife at the bedside.  Wife shares that the current plan is to attempt CRRT due to his acute kidney injury as well as fluid retention that has been resistant to diuresis.  Wife remains understandably tearful and guilty about the events that transpired that led to the patient's current critical illness.  Wife shares that she does not want to talk about any more negative outcomes at this point, continues to share that she hopes for a recovery.  Wife remains open to continued visits from chaplain services for prayers.  Review of Systems  Unable to perform ROS   Objective:   Primary Diagnoses: Present on Admission:  Critical polytrauma   Vital Signs:  BP 113/60   Pulse (!) 59   Temp 99.3 F (37.4 C) (Axillary)   Resp (!) 22   Ht 5' 11 (1.803 m)  Wt 116 kg   SpO2 91%   BMI 35.67 kg/m   Vent Mode: PRVC FiO2 (%):  [60 %-70 %] 60 % Set Rate:  [20 bmp] 20 bmp Vt Set:  [560 mL] 560 mL PEEP:  [8 cmH20] 8 cmH20 Plateau Pressure:  [20 cmH20-30 cmH20] 20 cmH20   Physical Exam Constitutional:      Appearance: He is ill-appearing.  HENT:     Head: Normocephalic and atraumatic.  Cardiovascular:     Rate and Rhythm: Normal rate.  Pulmonary:     Comments:  Full ventilator support Musculoskeletal:        General: Swelling present.  Neurological:     Comments: Intubated and sedated    Palliative Assessment/Data: 30%   Existing Vynca/ACP Documentation: Has advanced directive and HCPOA documentation in EMR  Thank you for allowing us  to participate in the care of Tom Norton PMT will continue to support holistically.  I personally spent a total of 35 minutes in the care of the patient today including preparing to see the patient, performing a medically appropriate exam/evaluation, counseling and educating, referring and communicating with other health care professionals, and documenting clinical information in the EHR.   Tom Norton E Perris Tripathi, PA-C  Palliative Medicine Team  Team Phone # [phone] (Nights/Weekends) 04/28/2024 4:15 PM  "

## 2024-04-28 NOTE — Procedures (Signed)
 Central Venous Catheter Insertion Procedure Note  Tom Norton  [id number]  06/22/1929  Date:04/28/2024  Time:10:43 AM   Provider Performing:Tom Norton   Procedure: Insertion of Non-tunneled Central Venous [phone]) with US  guidance (23062)   Indication(s) Hemodialysis  Consent Risks of the procedure as well as the alternatives and risks of each were explained to the patient and/or caregiver.  Consent for the procedure was obtained and is signed in the bedside chart  Anesthesia Topical only with 1% lidocaine    Timeout Verified patient identification, verified procedure, site/side was marked, verified correct patient position, special equipment/implants available, medications/allergies/relevant history reviewed, required imaging and test results available.  Sterile Technique Maximal sterile technique including full sterile barrier drape, hand hygiene, sterile gown, sterile gloves, mask, Norton covering, sterile ultrasound probe cover (if used).  Procedure Description Area of catheter insertion was cleaned with chlorhexidine  and draped in sterile fashion.  With real-time ultrasound guidance a HD catheter was placed into the right internal jugular vein. Nonpulsatile blood flow and easy flushing noted in all ports.  The catheter was sutured in place and sterile dressing applied.  Complications/Tolerance Wire/needle interface in HD kit was broken causing inability to thread (came out perpendicular at distal end; this was discovered after three failed threading attempts; therefore CVL kit opened and wire easily threaded Chest X-ray is ordered to verify placement for internal jugular or subclavian cannulation.   Chest x-ray is not ordered for femoral cannulation.  EBL 50cc  Specimen(s) None

## 2024-04-28 NOTE — Progress Notes (Signed)
 Assessment 89 y/o M w/ hx Afib on Eliquis  who fell off a roof and sustained multiple injuries including multiple scattered intracranial hemorrhages (worse on repeat imaging), nondisplaced L parietal skull fx, scattered bilateral infarcts c/w cardioembolic etiology, T9-10 endplate fx, T12 TP/left pars fx, T4 endplate fx, femur fracture, rib fractures, prolonged ICU stay  LOS: 11 days    Plan: SBP<160 Ok for DVT chemoppx now [facility] for therapeutic anticoagulation now if required. Would need discussion with family regarding risks and benefits prior to starting Can have head of bed as tolerated in bed. If ultimately is able to have more activity in the future, will require a TLSO brace when sitting or standing. Will hold off on ordering for now given guarded prognosis   Subjective: Intubated, sedated  Objective: Vital signs in last 24 hours: Temp:  [98.1 F (36.7 C)-99.7 F (37.6 C)] 98.1 F (36.7 C) (01/05 0400) Pulse Rate:  [53-114] 61 (01/05 0700) Resp:  [16-28] 21 (01/05 0700) BP: (96-124)/(55-83) 115/57 (01/05 0700) SpO2:  [84 %-100 %] 93 % (01/05 0700) Arterial Line BP: (110-146)/(44-87) 126/64 (01/05 0700) FiO2 (%):  [70 %-100 %] 70 % (01/05 0752) Weight:  [883 kg] 116 kg (01/05 0336)  Intake/Output from previous day: 01/04 0701 - 01/05 0700 In: 3563 [I.V.:1687.8; NG/GT:1420; IV Piggyback:455.2] Out: 3235 [Urine:3235] Intake/Output this shift: No intake/output data recorded.  Exam: Intubated, sedated, on multiple pressors  Lab Results: Recent Labs    04/27/24 0538 04/27/24 0855 04/28/24 0344  WBC 14.6*  --  16.7*  HGB 7.9* 8.8* 8.2*  HCT 24.3* 26.0* 26.3*  PLT 115*  --  145*   BMET Recent Labs    04/27/24 1537 04/28/24 0524  NA 143 143  K 4.0 4.7  CL 106 106  CO2 24 24  GLUCOSE 180* 128*  BUN 89* 102*  CREATININE 2.15* 2.65*  CALCIUM  8.5* 8.6*       Young Mulvey R Mckinleigh Schuchart 04/28/2024, 8:00 AM

## 2024-04-28 NOTE — Progress Notes (Signed)
 Pharmacy Antibiotic Note  Tom Norton is a 89 y.o. male admitted on 04/17/2024 with pneumonia.  Pharmacy has been consulted for meropenem , vancomycin  dosing.  Pt starting CRRT today   Plan: Meropenem  1g IV q 8h on CRRT Vancomycin  2g IV x1 then vancomycin  1250mg  q24hours on CRRT Goal trough 15-58mcg/mL, Goal AUC 400-600 -F/u renal function, LOT, and culture data -F/u vancomycin  levels PRN per protocol   Height: 5' 11 (180.3 cm) Weight: 116 kg (255 lb 11.7 oz) IBW/kg (Calculated) : 75.3  Temp (24hrs), Avg:98.9 F (37.2 C), Min:98.1 F (36.7 C), Max:99.7 F (37.6 C)  Recent Labs  Lab 04/26/24 0536 04/26/24 1301 04/26/24 2109 04/27/24 0538 04/27/24 1537 04/28/24 0344 04/28/24 0524  WBC 12.9* 12.6* 16.5* 14.6*  --  16.7*  --   CREATININE 1.48* 1.61* 1.63* 1.86* 2.15*  --  2.65*    Estimated Creatinine Clearance: 22.6 mL/min (A) (by C-G formula based on SCr of 2.65 mg/dL (H)).    Allergies[1]  Antimicrobials this admission: Cefepime  12/30>1/5 Vanc 1/5> Merrem  1/5>  Dose adjustments this admission:   Microbiology results: 12/25 MRSA neg 12/30 Tracheal aspirate - kleb aerogenes  1/5 Tracheal aspirate 1/5 RVP   Thank you for allowing pharmacy to be a part of this patients care.  Sharyne Glatter, PharmD, BCCCP Critical Care Clinical Pharmacist 04/28/2024 9:06 AM     [1]  Allergies Allergen Reactions   Bee Venom Anaphylaxis   Doxycycline  Hyclate Anaphylaxis   Yellow Jacket Venom Anaphylaxis   Atorvastatin Other (See Comments)   Doxycycline  Hyclate     Other Reaction(s): tachycardia/afib   Finasteride     Other Reaction(s): weak urinary stream   Sulfamethoxazole     Other Reaction(s): rash/tachycardia/hypotension   Bactrim [Sulfamethoxazole-Trimethoprim] Hives and Rash   Rivaroxaban  Rash

## 2024-04-29 DIAGNOSIS — S065XAA Traumatic subdural hemorrhage with loss of consciousness status unknown, initial encounter: Secondary | ICD-10-CM | POA: Diagnosis not present

## 2024-04-29 DIAGNOSIS — W19XXXA Unspecified fall, initial encounter: Secondary | ICD-10-CM | POA: Diagnosis not present

## 2024-04-29 DIAGNOSIS — S270XXA Traumatic pneumothorax, initial encounter: Secondary | ICD-10-CM | POA: Diagnosis not present

## 2024-04-29 DIAGNOSIS — T07XXXA Unspecified multiple injuries, initial encounter: Secondary | ICD-10-CM | POA: Diagnosis not present

## 2024-04-29 DIAGNOSIS — Z515 Encounter for palliative care: Secondary | ICD-10-CM | POA: Diagnosis not present

## 2024-04-29 DIAGNOSIS — S2243XA Multiple fractures of ribs, bilateral, initial encounter for closed fracture: Secondary | ICD-10-CM | POA: Diagnosis not present

## 2024-04-29 LAB — RENAL FUNCTION PANEL
Albumin: 3.2 g/dL — ABNORMAL LOW (ref 3.5–5.0)
Albumin: 3.2 g/dL — ABNORMAL LOW (ref 3.5–5.0)
Anion gap: 13 (ref 5–15)
Anion gap: 12 (ref 5–15)
BUN: 82 mg/dL — ABNORMAL HIGH (ref 8–23)
BUN: 73 mg/dL — ABNORMAL HIGH (ref 8–23)
CO2: 25 mmol/L (ref 22–32)
CO2: 24 mmol/L (ref 22–32)
Calcium: 8.7 mg/dL — ABNORMAL LOW (ref 8.9–10.3)
Calcium: 8.5 mg/dL — ABNORMAL LOW (ref 8.9–10.3)
Chloride: 104 mmol/L (ref 98–111)
Chloride: 103 mmol/L (ref 98–111)
Creatinine, Ser: 2.03 mg/dL — ABNORMAL HIGH (ref 0.61–1.24)
Creatinine, Ser: 1.8 mg/dL — ABNORMAL HIGH (ref 0.61–1.24)
GFR, Estimated: 30 mL/min — ABNORMAL LOW
GFR, Estimated: 35 mL/min — ABNORMAL LOW
Glucose, Bld: 166 mg/dL — ABNORMAL HIGH (ref 70–99)
Glucose, Bld: 152 mg/dL — ABNORMAL HIGH (ref 70–99)
Phosphorus: 2.9 mg/dL (ref 2.5–4.6)
Phosphorus: 2.8 mg/dL (ref 2.5–4.6)
Potassium: 4.2 mmol/L (ref 3.5–5.1)
Potassium: 4.7 mmol/L (ref 3.5–5.1)
Sodium: 141 mmol/L (ref 135–145)
Sodium: 138 mmol/L (ref 135–145)

## 2024-04-29 LAB — GLUCOSE, CAPILLARY
Glucose-Capillary: 128 mg/dL — ABNORMAL HIGH (ref 70–99)
Glucose-Capillary: 137 mg/dL — ABNORMAL HIGH (ref 70–99)
Glucose-Capillary: 138 mg/dL — ABNORMAL HIGH (ref 70–99)
Glucose-Capillary: 152 mg/dL — ABNORMAL HIGH (ref 70–99)
Glucose-Capillary: 155 mg/dL — ABNORMAL HIGH (ref 70–99)
Glucose-Capillary: 156 mg/dL — ABNORMAL HIGH (ref 70–99)

## 2024-04-29 LAB — HEPARIN LEVEL (UNFRACTIONATED): Heparin Unfractionated: 0.63 [IU]/mL (ref 0.30–0.70)

## 2024-04-29 LAB — APTT
aPTT: 43 s — ABNORMAL HIGH (ref 24–36)
aPTT: 101 s — ABNORMAL HIGH (ref 24–36)

## 2024-04-29 LAB — CBC
HCT: 26.5 % — ABNORMAL LOW (ref 39.0–52.0)
Hemoglobin: 8.3 g/dL — ABNORMAL LOW (ref 13.0–17.0)
MCH: 31.6 pg (ref 26.0–34.0)
MCHC: 31.3 g/dL (ref 30.0–36.0)
MCV: 100.8 fL — ABNORMAL HIGH (ref 80.0–100.0)
Platelets: 207 K/uL (ref 150–400)
RBC: 2.63 MIL/uL — ABNORMAL LOW (ref 4.22–5.81)
RDW: 20.5 % — ABNORMAL HIGH (ref 11.5–15.5)
WBC: 16.2 K/uL — ABNORMAL HIGH (ref 4.0–10.5)
nRBC: 3.3 % — ABNORMAL HIGH (ref 0.0–0.2)

## 2024-04-29 LAB — MAGNESIUM: Magnesium: 2.6 mg/dL — ABNORMAL HIGH (ref 1.7–2.4)

## 2024-04-29 LAB — TRIGLYCERIDES: Triglycerides: 83 mg/dL

## 2024-04-29 MED ORDER — HEPARIN (PORCINE) 25000 UT/250ML-% IV SOLN
1500.0000 [IU]/h | INTRAVENOUS | Status: DC
  Administered 2024-04-29: 1500 [IU]/h via INTRAVENOUS
  Administered 2024-04-30: 1350 [IU]/h via INTRAVENOUS
  Administered 2024-04-30: 1250 [IU]/h via INTRAVENOUS
  Administered 2024-05-01: 1300 [IU]/h via INTRAVENOUS
  Administered 2024-05-02: 1400 [IU]/h via INTRAVENOUS
  Administered 2024-05-03 – 2024-05-04 (×3): 1500 [IU]/h via INTRAVENOUS
  Filled 2024-04-29 (×8): qty 250

## 2024-04-29 NOTE — Progress Notes (Signed)
 This chaplain responded to PMT PA-Joseph consult for F/U spiritual care and  prayer. The Pt. wife-Jane is not at the bedside. This chaplain will attempt a revisit.  Chaplain Leeroy Hummer [phone]

## 2024-04-29 NOTE — Progress Notes (Signed)
 SLP Cancellation Note  Patient Details Name: Tom Norton MRN: [id number] DOB: 01-28-1930   Cancelled treatment:       Reason Eval/Treat Not Completed: Patient not medically ready Continues on vent. SLP will follow.  [name] IVAR Blase, MA, CCC-SLP Speech Therapy  04/29/2024, 9:04 AM

## 2024-04-29 NOTE — TOC Progression Note (Signed)
 Transition of Care [facility]) - Progression Note    Patient Details  Name: Tom Norton MRN: [id number] Date of Birth: 1929/07/25  [facility] CM/SW Contact  Carmelita FORBES Carbon, LCSW Phone Number: 04/29/2024, 12:57 PM  Clinical Narrative:    ICM continues to follow. Patient remains in ICU and has been started on CRRT.     Barriers to Discharge: Continued Medical Work up               Expected Discharge Plan and Services       Living arrangements for the past 2 months: Single Family Home                                       Social Drivers of Health (SDOH) Interventions SDOH Screenings   Food Insecurity: No Food Insecurity (04/22/2024)  Housing: Low Risk (04/22/2024)  Transportation Needs: No Transportation Needs (04/22/2024)  Utilities: Patient Unable To Answer (04/18/2024)  Social Connections: Socially Integrated (04/22/2024)  Tobacco Use: Low Risk (04/17/2024)    Readmission Risk Interventions     No data to display

## 2024-04-29 NOTE — Progress Notes (Signed)
 " Daily Progress Note   Date: 04/29/2024   Patient Name: Tom Norton  DOB: 1930-01-06  MRN: [id number]  Age / Sex: 89 y.o., male  Attending Physician: Rolla Seltzer, MD Primary Care Physician: Charlott Dorn LABOR, MD Admit Date: 04/17/2024 Length of Stay: 12 days  Reason for Follow-up: Establishing goals of care  Past Medical History:  Diagnosis Date   BPH (benign prostatic hyperplasia)    Cardiomegaly    Dilated aortic root    a. 09/2013: 4.2cm by echo.   Dysrhythmia    Enlarged prostate without lower urinary tract symptoms (luts)    GERD (gastroesophageal reflux disease)    rare   History of hiatal hernia    Hyperlipidemia    Hypertension    Hypothyroidism    Insomnia    Junctional bradycardia    OA (osteoarthritis)    Obesity    Paroxysmal A-fib (HCC)    a. 09/23/13 a-fib with RVR and hypotensive, requiring emergent cardioversion in ED   Peripheral neuropathy 07/17/2017   Vitamin D  deficiency     Assessment & Plan:   HPI/Patient Profile:   89 y.o. male  with past medical history of HLD, HTN, osteoarthritis, paroxysmal atrial fibrillation on eliquis  admitted on 04/17/2024  with SDH, SAH, and right femur fracture 2/2 fall from balcony. CT head 04/17/2024 w/o contrast demonstrated left posterior SDH up to 9 mm thickness, multifocal bilateral SAH, linear skull fracture of left parietooccipital bone, right posterior scalp hematoma. Repeat CT head on 04/18/2024 demonstrate increase in multifocal hemorrhage, now bilateral SDH, increased SAH volume, minimal midline shift has increased. Per neurosurgery note by Darnella MD on 04/17/2024 patient not a candidate for hematoma evacuation surgery if his hemorrhages were to expand.    Palliative medicine consulted for goals of care conversation.  SUMMARY OF RECOMMENDATIONS Full code, full scope Healthcare power of attorney in order Tom Norton, Tom Norton, Tom Norton Palliative medicine will continue to follow for further  goals of care conversation  Symptom Management:  Per primary team  Code Status: Full Code  Prognosis: Unable to determine  Discharge Planning: To Be Determined  Subjective:   Subjective: Chart Reviewed. Updates received. Patient Assessed. Created space and opportunity for patient  and family to explore thoughts and feelings regarding current medical situation.  Today's Discussion:  Met with the patient at bedside today with son Tom Norton present.  Son reiterates that the patient most likely will remain aware of the conversation is not going around him at this time, and that he would not like negative talk about the patient at this time, and he wants positive.  Patient shares he will see full medical intervention send will continue to work as intended, again shares that he wants to continue to take things day by day, he wants to look at all the positives that he can see at this time.  He shares that there have been improvements from his perspective, including less vasopressor needs, decreasing ventilator support, as well as continued stability with CRRT.   Review of Systems  Unable to perform ROS   Objective:   Primary Diagnoses: Present on Admission:  Critical polytrauma  Vent Mode: PRVC FiO2 (%):  [40 %-60 %] 40 % Set Rate:  [20 bmp] 20 bmp Vt Set:  [560 mL] 560 mL PEEP:  [8 cmH20] 8 cmH20 Plateau Pressure:  [21 cmH20] 21 cmH20  Vital Signs:  BP 103/65 (BP Location: Left Arm)   Pulse (!) 107   Temp 98.2 F (  36.8 C) (Axillary)   Resp (!) 23   Ht 5' 11 (1.803 m)   Wt 116.8 kg   SpO2 99%   BMI 35.91 kg/m   Physical Exam Constitutional:      Appearance: He is ill-appearing.     Comments: Sedated  HENT:     Head: Normocephalic.     Nose:     Comments: Core track in place    Mouth/Throat:     Comments: ET tube in place Cardiovascular:     Rate and Rhythm: Tachycardia present.     Pulses: Normal pulses.  Pulmonary:     Comments: Full ventilator  support   Palliative Assessment/Data: 30% tube feeds   Existing Vynca/ACP Documentation: HCPOA advance directive in EMR  Thank you for allowing us  to participate in the care of Tom Norton PMT will continue to support holistically.  I personally spent a total of 25 minutes in the care of the patient today including preparing to see the patient, counseling and educating, and documenting clinical information in the EHR.  Tom Cunliffe E Vonne Mcdanel, PA-C  Palliative Medicine Team  Team Phone # [phone] (Nights/Weekends) 04/29/2024 8:25 AM  "

## 2024-04-29 NOTE — Progress Notes (Signed)
 "  NAME:  Tom Norton, MRN:  [id number], DOB:  1930/03/08, LOS: 12 ADMISSION DATE:  04/17/2024, CONSULTATION DATE: 04/22/2024 REFERRING MD:  Trauma, CHIEF COMPLAINT:  Shock   History of Present Illness:  89 year old man admitted as polytrauma after fall from deck with multiple orthopedic injuries, fracture, whom we are consulted for hypoxemia and shock.  Patient was admitted Christmas Day after fall from deck per report.  Initial images reveal left-sided rib fractures, right femur fracture, possible iliac fractures, left posterior subdural hematoma no midline shift, small left posterior parafalcine subdural hematoma, traumatic subarachnoid hemorrhage, skull fracture, trace left apical pneumothorax.  Chest x-ray 12/28 demonstrated left-sided atelectasis and small pleural effusion on my review interpretation.  He went for femur repair 12/29.  He named and remains intubated after procedure.  Chest x-ray 12/29 reveals ET tube placement, similar right-sided infiltrates and atelectasis, new rounded left-sided infiltrate on my review interpretation.  He developed shock now on maximum dose norepinephrine , vasopressin , started on phenylephrine  overnight.  He remains intubated.  Minimal vent settings.  He is on Precedex  for sedation.  Weaning Precedex .  Blood pressure get a bit better.  Review of labs reveal serial low hemoglobins.  He is getting a transfusion today.  He has had transfusions earlier this admission.  Pertinent Medical History:   Past Medical History:  Diagnosis Date   BPH (benign prostatic hyperplasia)    Cardiomegaly    Dilated aortic root    a. 09/2013: 4.2cm by echo.   Dysrhythmia    Enlarged prostate without lower urinary tract symptoms (luts)    GERD (gastroesophageal reflux disease)    rare   History of hiatal hernia    Hyperlipidemia    Hypertension    Hypothyroidism    Insomnia    Junctional bradycardia    OA (osteoarthritis)    Obesity    Paroxysmal A-fib (HCC)    a.  09/23/13 a-fib with RVR and hypotensive, requiring emergent cardioversion in ED   Peripheral neuropathy 07/17/2017   Vitamin D  deficiency    Significant Hospital Events: Including procedures, antibiotic start and stop dates in addition to other pertinent events   12/25 admitted after fall 12/29 OR for femur fracture repair  12/30 PCCM consult, AFIB RVR overnight 1/5 RIJ Trialysis, CRRT started  Interim History / Subjective:  No significant events RIJ Trialysis placed yesterday, CRRT initiated +DVT on dopplers, unable to obtain CT Head prior to [facility] initiation due to clinical instability Ongoing GOC discussions, patient's wife continues to wish for full code status  Objective:   Blood pressure 103/65, pulse (!) 107, temperature 98.2 F (36.8 C), temperature source Axillary, resp. rate (!) 23, height 5' 11 (1.803 m), weight 116.8 kg, SpO2 99%.    Vent Mode: PRVC FiO2 (%):  [60 %] 60 % Set Rate:  [20 bmp] 20 bmp Vt Set:  [560 mL] 560 mL PEEP:  [8 cmH20] 8 cmH20 Plateau Pressure:  [20 cmH20-21 cmH20] 21 cmH20   Intake/Output Summary (Last 24 hours) at 04/29/2024 0811 Last data filed at 04/29/2024 0800 Gross per 24 hour  Intake 3714.66 ml  Output 3698.5 ml  Net 16.16 ml   Filed Weights   04/27/24 0500 04/28/24 0336 04/29/24 0411  Weight: 116.7 kg 116 kg 116.8 kg   Physical Examination: General: Acutely ill-appearing elderly man in NAD. HEENT: [place]/AT, anicteric sclera, PERRL 2mm, moist mucous membranes. Neck: RIJ Trialysis catheter in place. Neuro: Intubated, sedated. Does not respond to verbal, tactile or noxious stimuli. Not following commands.  Moves BUE spontaneously with sedation weaned. +Corneal, +Cough, and +Gag  CV: RRR, no m/g/r. PULM: Breathing even and unlabored on vent (PEEP 8, FiO2 50%). Lung fields coarse, diminished at bases bilaterally. GI: Soft, nontender, nondistended. Normoactive bowel sounds. Extremities: Bilateral symmetric pitting 2+ UE/LE edema noted, gross  anasarca. Skin: Warm/damp, no rashes.   amiodarone  30 mg/hr (04/29/24 0800)   dexmedetomidine  (PRECEDEX ) IV infusion 0.7 mcg/kg/hr (04/29/24 0800)   feeding supplement (PIVOT 1.5 CAL) 50 mL/hr at 04/29/24 0800   heparin  10,000 units/ 20 mL infusion syringe 500 Units/hr (04/29/24 0749)   meropenem  (MERREM ) IV Stopped (04/29/24 0045)   norepinephrine  (LEVOPHED ) Adult infusion 8 mcg/min (04/29/24 0800)   prismasol  BGK 4/2.5 400 mL/hr at 04/29/24 0208   prismasol  BGK 4/2.5 1,500 mL/hr at 04/29/24 9367   prismasol  BGK 4/2.5 400 mL/hr at 04/29/24 0210   propofol  (DIPRIVAN ) infusion 10 mcg/kg/min (04/29/24 0800)   vancomycin      vasopressin  0.03 Units/min (04/29/24 0800)   Patient Lines/Drains/Airways Status     Active Line/Drains/Airways     Name Placement date Placement time Site Days   Arterial Line 04/23/24 Left Radial 04/23/24  0240  Radial  6   Peripheral IV 04/17/24 20 G Anterior;Distal;Left Forearm 04/17/24  1849  Forearm  12   PICC Triple Lumen 04/17/24 Right Brachial 40 cm 0 cm 04/17/24  2320  -- 12   Hemodialysis Catheter Right Internal jugular Triple lumen Temporary (Non-Tunneled) 04/28/24  1000  Internal jugular  1   Negative Pressure Wound Therapy Knee Anterior;Left 02/18/21  1708  --  1166   Urethral Catheter [name], RN Latex 16 Fr. 04/18/24  0000  Latex  11   Airway 7.5 mm 04/21/24  1211  -- 8   Small Bore Feeding Tube 10 Fr. Left nare Marking at nare/corner of mouth 61 cm 04/18/24  1610  Left nare  11   Wound 04/21/24 1333 Surgical Closed Surgical Incision Hip Right 04/21/24  1333  Hip  8   Wound 04/26/24 0803 Atypical Pretibial Right;Lateral;Lower 04/26/24  0803  Pretibial  3   Wound 04/26/24 0807 Atypical Pretibial Right;Anterior 04/26/24  0807  Pretibial  3   Wound 04/26/24 0807 Pretibial Distal;Right;Anterior 04/26/24  0807  Pretibial  3   Wound 04/26/24 0807 Ankle Anterior;Right 04/26/24  0807  Ankle  3           Assessment and Plan:    Polytrauma (TBI, rib fx, spinal fx, psoas hematoma, R femur fx post ORIF) Type 1 resp failure related now mostly to massive volume overload with diuresis limited by hemodynamics Ongoing vasoplegia Klebsiella HCAP on cefepime  x 7 days Ongoing ABLA query phlebotomy and critical illness- last transfusion 04/27/23 Afib/RVR related to atrial stretch, improved with amio  - Goal MAP > 65 - Levophed  titrated to goal MAP + vasopressin  - Continue midodrine  - Trend WBC, fever curve - F/u Cx data - Continue broad-spectrum antibiotics (s/p cefepime  course) - Continue full vent support (4-8cc/kg IBW) - Wean FiO2 for O2 sat > 90% - Daily WUA/SBT - VAP bundle - Pulmonary hygiene - PAD protocol for sedation: Precedex  and Fentanyl  for goal RASS 0 to -1 - CRRT initiated 1/5, appreciate Nephro assistance - Trend BMP - Replete electrolytes as indicated - Monitor I&Os - DVT+, heparin  gtt initiated; unable to CT Head prior to initiation, family aware of risks - Unna boots, WOC following - Remainder of polytrauma per Trauma team - PMT following for ongoing GOC/QOL discussions, patient remains full code at  present, patient's wife continues to wish for full scope of care  Critical care time:   The patient is critically ill with multiple organ system failure and requires high complexity decision making for assessment and support, frequent evaluation and titration of therapies, advanced monitoring, review of radiographic studies and interpretation of complex data.   Critical Care Time devoted to patient care services, exclusive of separately billable procedures, described in this note is 35 minutes.  Corean CHRISTELLA Akari Defelice, PA-C [place] 04/29/2024 8:11 AM  Please see [email or web address] for pager details.  From 7A-7P if no response, please call [phone] After hours, please call ELink [phone] "

## 2024-04-29 NOTE — Progress Notes (Signed)
 Assessment 89 y/o M w/ hx Afib on Eliquis  who fell off a roof and sustained multiple injuries including multiple scattered intracranial hemorrhages (worse on repeat imaging), nondisplaced L parietal skull fx, scattered bilateral infarcts c/w cardioembolic etiology, T9-10 endplate fx, T12 TP/left pars fx, T4 endplate fx, femur fracture, rib fractures, prolonged ICU stay  LOS: 12 days    Plan: SBP<160 Ok for DVT chemoppx now Prefer to have a CT head prior to initiation of therapeutic anticoagulation.  However, given that patient is not stable enough to get a CT, I think it reasonable to start as long as family is aware of the potential risks of hemorrhage Can have head of bed as tolerated in bed. If ultimately is able to have more activity in the future, will require a TLSO brace when sitting or standing. Will hold off on ordering for now given guarded prognosis   Subjective: I explained the risk of intracranial hemorrhage with therapeutic anticoagulation to the wife. They verbalized understanding  Objective: Vital signs in last 24 hours: Temp:  [98.2 F (36.8 C)-99.3 F (37.4 C)] 98.2 F (36.8 C) (01/06 0400) Pulse Rate:  [52-121] 100 (01/06 0730) Resp:  [21-27] 21 (01/06 0730) BP: (90-140)/(53-72) 100/55 (01/06 0700) SpO2:  [91 %-100 %] 99 % (01/06 0730) Arterial Line BP: (105-170)/(40-79) 116/48 (01/06 0730) FiO2 (%):  [60 %-70 %] 60 % (01/06 0317) Weight:  [116.8 kg] 116.8 kg (01/06 0411)  Intake/Output from previous day: 01/05 0701 - 01/06 0700 In: 3720 [I.V.:1380; NG/GT:1640; IV Piggyback:700] Out: 3478.5 [Urine:795] Intake/Output this shift: No intake/output data recorded.  Exam: Intubated, sedated, on multiple pressors PERRL  Lab Results: Recent Labs    04/28/24 0344 04/29/24 0458  WBC 16.7* 16.2*  HGB 8.2* 8.3*  HCT 26.3* 26.5*  PLT 145* 207   BMET Recent Labs    04/28/24 2158 04/29/24 0458  NA 141 141  K 4.6 4.2  CL 105 104  CO2 24 25  GLUCOSE 159*  166*  BUN 91* 82*  CREATININE 2.33* 2.03*  CALCIUM  8.5* 8.7*       Dorn SAUNDERS Jonae Renshaw 04/29/2024, 7:36 AM

## 2024-04-29 NOTE — Progress Notes (Signed)
 PHARMACY - ANTICOAGULATION CONSULT NOTE  Pharmacy Consult for heparin  gtt Indication: VTE treatment  Allergies[1]  Patient Measurements: Height: 5' 11 (180.3 cm) Weight: 116.8 kg (257 lb 8 oz) IBW/kg (Calculated) : 75.3 Heparin  dosing weight 99.5 kg  Vital Signs: Temp: 98.2 F (36.8 C) (01/06 0400) Temp Source: Axillary (01/06 0400) BP: 103/65 (01/06 0800) Pulse Rate: 107 (01/06 0800)  Labs: Recent Labs    04/27/24 0538 04/27/24 0855 04/27/24 1537 04/28/24 0344 04/28/24 0524 04/28/24 2158 04/29/24 0458  HGB 7.9* 8.8*  --  8.2*  --   --  8.3*  HCT 24.3* 26.0*  --  26.3*  --   --  26.5*  PLT 115*  --   --  145*  --   --  207  APTT  --   --   --   --   --   --  43*  CREATININE 1.86*  --    < >  --  2.65* 2.33* 2.03*   < > = values in this interval not displayed.    Estimated Creatinine Clearance: 29.6 mL/min (A) (by C-G formula based on SCr of 2.03 mg/dL (H)).   Assessment: 89 yo M polytrauma including bilateral SDH and scattered SAH. Pt was found to have acute DVT in R and L legs. Notably, there is also large hematoma in groin throughout thigh. Pharmacy consulted for heparin  gtt for VTE treatment.  Low goal, no bolus   Hgb 8.3, Plt 207 today aPTT 43 this AM with heparin  in CRRT circuit + SQH for DVT prophylaxis   Goal of Therapy:  Heparin  level 0.3-0.5 units/ml Monitor platelets by anticoagulation protocol: Yes   Plan:  D/c heparin  SQ No bolus Change to fixed dose heparin  in CRRT , currently running at 500 units/hr Initiate heparin  gtt at 1500 units/hr 8hr HL Daily HL, CBC F/u s/sx bleeding  Sharyne Glatter, PharmD, BCCCP Critical Care Clinical Pharmacist 04/29/2024 8:34 AM    [1]  Allergies Allergen Reactions   Bee Venom Anaphylaxis   Doxycycline  Hyclate Anaphylaxis   Yellow Jacket Venom Anaphylaxis   Atorvastatin Other (See Comments)   Doxycycline  Hyclate     Other Reaction(s): tachycardia/afib   Finasteride     Other Reaction(s): weak  urinary stream   Sulfamethoxazole     Other Reaction(s): rash/tachycardia/hypotension   Bactrim [Sulfamethoxazole-Trimethoprim] Hives and Rash   Rivaroxaban  Rash

## 2024-04-29 NOTE — Progress Notes (Signed)
 Ok to hold off on CT until CRRT filter clots or patient is more stable per Dr. Darnella and Dr. Sebastian.

## 2024-04-29 NOTE — Progress Notes (Signed)
 PHARMACY - ANTICOAGULATION CONSULT NOTE  Pharmacy Consult for heparin  gtt Indication: VTE treatment  Allergies[1]  Patient Measurements: Height: 5' 11 (180.3 cm) Weight: 116.8 kg (257 lb 8 oz) IBW/kg (Calculated) : 75.3 Heparin  dosing weight 99.5 kg  Vital Signs: Temp: 97.4 F (36.3 C) (01/06 1554) Temp Source: Axillary (01/06 1554) BP: 91/67 (01/06 1845) Pulse Rate: 106 (01/06 1845)  Labs: Recent Labs    04/27/24 0538 04/27/24 0855 04/27/24 1537 04/28/24 0344 04/28/24 0524 04/28/24 2158 04/29/24 0458 04/29/24 1600 04/29/24 1657 04/29/24 1658  HGB 7.9* 8.8*  --  8.2*  --   --  8.3*  --   --   --   HCT 24.3* 26.0*  --  26.3*  --   --  26.5*  --   --   --   PLT 115*  --   --  145*  --   --  207  --   --   --   APTT  --   --   --   --   --   --  43*  --  101*  --   HEPARINUNFRC  --   --   --   --   --   --   --   --   --  0.63  CREATININE 1.86*  --    < >  --    < > 2.33* 2.03* 1.80*  --   --    < > = values in this interval not displayed.    Estimated Creatinine Clearance: 33.3 mL/min (A) (by C-G formula based on SCr of 1.8 mg/dL (H)).   Assessment: 89 yo M polytrauma including bilateral SDH and scattered SAH. Pt was found to have acute DVT in R and L legs. Notably, there is also large hematoma in groin throughout thigh. Pharmacy consulted for heparin  gtt for VTE treatment. Low goal, no bolus.  aPTT 101 sec, heparin  level 0.63 (appear to be correlating so will utilize heparin  level for monitoring). Both are elevated above lower goal. No bleeding noted.  Goal of Therapy:  Heparin  level 0.3-0.5 units/ml Monitor platelets by anticoagulation protocol: Yes   Plan:  Decrease heparin  gtt to 1350 units/hr 8hr heparin  level  Vito Ralph, PharmD, BCPS Please see amion for complete clinical pharmacist phone list 04/29/2024 7:19 PM     [1]  Allergies Allergen Reactions   Bee Venom Anaphylaxis   Doxycycline  Hyclate Anaphylaxis   Yellow Jacket Venom Anaphylaxis    Atorvastatin Other (See Comments)   Doxycycline  Hyclate     Other Reaction(s): tachycardia/afib   Finasteride     Other Reaction(s): weak urinary stream   Sulfamethoxazole     Other Reaction(s): rash/tachycardia/hypotension   Bactrim [Sulfamethoxazole-Trimethoprim] Hives and Rash   Rivaroxaban  Rash

## 2024-04-29 NOTE — Progress Notes (Signed)
 [facility] Progress Note   Assessment/ Plan:   Assessment/Plan  AKI and volume overload: - making urine but not enough to make headway with volume unloading - will do CRRT- all 4K, will stop syringe hep since on a gtt now - time- limited trial- no more than 72 hrs - tolerating UF and FIO2 coming down   2.  Critical polytrauma             - after falling off deck on christmas             - per trauma team   3.  Afib with RVR             - hopefully volume unloading will improve this             - on amio   4.  Acute hypoxic RF:             - volume mediates some             - hoepfully we can get some fluid off to give him the best chance at extubation  5.  Acute R DVT  - on systemic gtt now   5.  Dispo:             - In ICU  Subjective:    Seen and examined.  On CRRT.  Still grossly volume overloaded but making a little headway.  Son at bedside.  New DVT leg.  On systemic hep gtt now    Objective:   BP (!) 84/53 Comment: levo titrated up  Pulse (!) 108   Temp 98.7 F (37.1 C) (Axillary)   Resp (!) 23   Ht 5' 11 (1.803 m)   Wt 116.8 kg   SpO2 90%   BMI 35.91 kg/m   Intake/Output Summary (Last 24 hours) at 04/29/2024 1148 Last data filed at 04/29/2024 1100 Gross per 24 hour  Intake 3726.7 ml  Output 4539.5 ml  Net -812.8 ml   Weight change: 0.8 kg  Physical Exam: Gen ill appearing elderly gentleman under drape, getting HD line Heent ETT in place Neck R nontunneled HD cath PULM bilateral wet CV RRR ABD soft EXT 4 limb anasarca NEURO intubated, sedated ACCESS R internal jugular nontunneled HD cath  Imaging: VAS US  LOWER EXTREMITY VENOUS (DVT) Result Date: 04/29/2024  Lower Venous DVT Study Patient Name:  Tom Norton  Date of Exam:   04/28/2024 Medical Rec #: [id number]         Accession #:    [id number] Date of Birth: 29-Oct-1929          Patient Gender: M Patient Age:   89 years Exam Location:  [facility] Procedure:      VAS US  LOWER  EXTREMITY VENOUS (DVT) Referring Phys: TORIBIO SHARPS --------------------------------------------------------------------------------  Indications: Edema. Other Indications: Status post fall from roof 04/17/25, resulting in polytrauma                    including SAH, SDH, Right femur fracture, and skull fracture.                    Status post open treatment of basicervical intertrochanteric                    and subtrochanteric fracture with intramedullary implant of                    right femur  04/21/24 Risk Factors: Atrial fibrillation on Eliquis  prior to admission. Limitations: Significant lower extremity edema, large hematoma in the right thigh, ventilation, vasopressors, and CRRT. SABRA Comparison Study: Prior negative right LEV done 04/22/24 Performing Technologist: Alberta Lis RVS  Examination Guidelines: A complete evaluation includes B-mode imaging, spectral Doppler, color Doppler, and power Doppler as needed of all accessible portions of each vessel. Bilateral testing is considered an integral part of a complete examination. Limited examinations for reoccurring indications may be performed as noted. The reflux portion of the exam is performed with the patient in reverse Trendelenburg.  +---------+---------------+---------+-----------+----------+-------------------+ RIGHT    CompressibilityPhasicitySpontaneityPropertiesThrombus Aging      +---------+---------------+---------+-----------+----------+-------------------+ CFV      Full           Yes      No                                       +---------+---------------+---------+-----------+----------+-------------------+ SFJ      Full                                                             +---------+---------------+---------+-----------+----------+-------------------+ FV Prox  Full           Yes      No                                       +---------+---------------+---------+-----------+----------+-------------------+  FV Mid   Full                                                             +---------+---------------+---------+-----------+----------+-------------------+ FV DistalFull           Yes      No                                       +---------+---------------+---------+-----------+----------+-------------------+ PFV      Full           Yes      No                                       +---------+---------------+---------+-----------+----------+-------------------+ POP      None           No       No                   Acute               +---------+---------------+---------+-----------+----------+-------------------+ PTV      None                                         Acute               +---------+---------------+---------+-----------+----------+-------------------+  PERO                                                  Not well visualized +---------+---------------+---------+-----------+----------+-------------------+ Soleal   None                                         Acute               +---------+---------------+---------+-----------+----------+-------------------+ Gastroc  None                                         Acute               +---------+---------------+---------+-----------+----------+-------------------+   +---------+---------------+---------+-----------+----------+-----------------+ LEFT     CompressibilityPhasicitySpontaneityPropertiesThrombus Aging    +---------+---------------+---------+-----------+----------+-----------------+ CFV      Full           Yes      No                                     +---------+---------------+---------+-----------+----------+-----------------+ SFJ      Full                                                           +---------+---------------+---------+-----------+----------+-----------------+ FV Prox  Full           Yes      No                                      +---------+---------------+---------+-----------+----------+-----------------+ FV Mid   Full           Yes      Yes                                    +---------+---------------+---------+-----------+----------+-----------------+ FV DistalFull           Yes      No                                     +---------+---------------+---------+-----------+----------+-----------------+ PFV      Full           Yes      No                                     +---------+---------------+---------+-----------+----------+-----------------+ POP      Full           Yes      No                                     +---------+---------------+---------+-----------+----------+-----------------+  PTV      None                                         Age Indeterminate +---------+---------------+---------+-----------+----------+-----------------+ PERO     None                                         Age Indeterminate +---------+---------------+---------+-----------+----------+-----------------+     Summary: RIGHT: - Findings consistent with acute deep vein thrombosis involving the right popliteal vein, and right posterior tibial veins. Findings consistent with acute intramuscular thrombosis involving the right gastrocnemius veins, and right soleal veins. - No cystic structure found in the popliteal fossa. - Large hematoma noted from groin throughout thigh Subcutaneous edema throughout  LEFT: - Findings consistent with age indeterminate deep vein thrombosis involving the left posterior tibial veins, and left peroneal veins.  - No cystic structure found in the popliteal fossa. Subcutaneous edema throughout.  *See table(s) above for measurements and observations. Electronically signed by Lonni Gaskins MD on 04/29/2024 at 9:27:33 AM.    Final    DG Chest Port 1 View Result Date: 04/28/2024 CLINICAL DATA:  Central line placement. EXAM: PORTABLE CHEST 1 VIEW COMPARISON:  04/28/2024 and CT chest  04/17/2024. FINDINGS: Endotracheal tube terminates 3.5 cm above the carina. Feeding tube is followed into the stomach with the tip projecting beyond the inferior margin of the image. Right PICC tip is at the brachiocephalic vein junction. New right IJ central line tip is in the SVC. Heart is enlarged, stable. Thoracic aorta is calcified. Diffuse bilateral mixed interstitial and airspace opacification in a craniocaudal gradient. Bilateral pleural effusions. No pneumothorax. IMPRESSION: 1. Right IJ central line placement without pneumothorax. 2. Congestive heart failure. Electronically Signed   By: Newell Eke M.D.   On: 04/28/2024 12:53   DG Chest Port 1 View Result Date: 04/28/2024 CLINICAL DATA:  Shortness of breath. EXAM: PORTABLE CHEST 1 VIEW COMPARISON:  04/25/2024 FINDINGS: Low volume film. The cardio pericardial silhouette is enlarged. Diffuse interstitial with mid and lower lung predominant airspace opacity is compatible with edema. Layering small bilateral pleural effusions evident. Endotracheal tube tip is 2.9 cm above the base of the carina. A feeding tube passes into the stomach although the distal tip position is not included on the film. Right PICC line tip overlies the region of the innominate vein confluence. Telemetry leads overlie the chest. IMPRESSION: 1. Low volume film with diffuse interstitial and mid / lower lung predominant airspace opacity compatible with edema. 2. Layering small bilateral pleural effusions. Electronically Signed   By: Camellia Candle M.D.   On: 04/28/2024 08:10    Labs: BMET Recent Labs  Lab 04/24/24 0549 04/24/24 1818 04/25/24 0541 04/25/24 1616 04/26/24 0536 04/26/24 1301 04/26/24 2109 04/27/24 0538 04/27/24 0855 04/27/24 1537 04/28/24 0524 04/28/24 2158 04/29/24 0458  NA 144   < > 147*   < > 146* 143 144 145 143 143 143 141 141  K 3.9   < > 3.3*   < > 3.6 3.8 3.9 3.8 4.0 4.0 4.7 4.6 4.2  CL 118*   < > 116*   < > 113* 111 111 109  --  106 106 105  104  CO2 18*   < > 20*   < > 22 22 22 24   --  24 24 24 25   GLUCOSE 149*   < > 151*   < > 193* 202* 176* 175*  --  180* 128* 159* 166*  BUN 43*   < > 52*   < > 67* 69* 73* 82*  --  89* 102* 91* 82*  CREATININE 1.18   < > 1.36*   < > 1.48* 1.61* 1.63* 1.86*  --  2.15* 2.65* 2.33* 2.03*  CALCIUM  8.1*   < > 8.6*   < > 8.4* 8.0* 7.9* 8.2*  --  8.5* 8.6* 8.5* 8.7*  PHOS 2.8  --  3.3  --  3.2  3.2  --   --  2.8  --   --  4.0 3.4 2.9   < > = values in this interval not displayed.   CBC Recent Labs  Lab 04/26/24 2109 04/27/24 0538 04/27/24 0855 04/28/24 0344 04/29/24 0458  WBC 16.5* 14.6*  --  16.7* 16.2*  NEUTROABS 13.5*  --   --   --   --   HGB 8.2* 7.9* 8.8* 8.2* 8.3*  HCT 24.8* 24.3* 26.0* 26.3* 26.5*  MCV 95.4 96.0  --  100.4* 100.8*  PLT 113* 115*  --  145* 207    Medications:     sodium chloride    Intravenous Once   acetaminophen  (TYLENOL ) oral liquid 160 mg/5 mL  650 mg Per Tube Q6H   artificial tears   Both Eyes QID   Chlorhexidine  Gluconate Cloth  6 each Topical Q0600   docusate  100 mg Per Tube BID   feeding supplement (PROSource TF20)  60 mL Per Tube TID   insulin  aspart  0-15 Units Subcutaneous Q4H   insulin  glargine-yfgn  8 Units Subcutaneous Daily   levothyroxine   50 mcg Per Tube QAC breakfast   midodrine   10 mg Per Tube TID WC   mouth rinse  15 mL Mouth Rinse Q2H   oxyCODONE   5 mg Per Tube Q6H   pantoprazole  (PROTONIX ) IV  40 mg Intravenous Q24H   polyethylene glycol  17 g Per Tube Daily   sodium chloride  flush  10-40 mL Intracatheter Q12H    Almarie Bonine, MD 04/29/2024, 11:48 AM

## 2024-04-29 NOTE — Progress Notes (Signed)
 Patient ID: Tom Norton, male   DOB: 29-May-1929, 89 y.o.   MRN: [id number] Dr. Claudene and I met with his wife and son for a clinical update. We answered their questions.  Dann Hummer, MD, MPH, FACS Please use [email or web address] to contact on call provider

## 2024-04-29 NOTE — Progress Notes (Signed)
 Verbal order from Dr. Claudene and Dr. Sebastian to go by cuff pressures instead of arterial line pressures for titrating pressors.

## 2024-04-29 NOTE — Progress Notes (Signed)
 Patient ID: Tom Norton, male   DOB: [birth year], 89 y.o.   MRN: [id number] Follow up - Trauma Critical Care   Patient Details:    Tom Norton is an 89 y.o. male.  Lines/tubes : Airway 7.5 mm (Active)  Secured at (cm) 26 cm 04/29/24 0317  Measured From Lips 04/29/24 0317  Secured Location Right 04/29/24 0317  Secured By [employer] 04/29/24 0317  Bite Block Yes 04/28/24 1516  Tube Holder Repositioned Yes 04/29/24 0317  Prone position No 04/28/24 1516  Cuff Pressure (cm H2O) Clear OR 27-39 CmH2O 04/28/24 1921  Site Condition Dry 04/28/24 1516     PICC Triple Lumen 04/17/24 Right Brachial 40 cm 0 cm (Active)  Indication for Insertion or Continuance of Line Vasoactive infusions 04/28/24 2000  Exposed Catheter (cm) 0 cm 04/17/24 2326  Site Assessment Clean, Dry, Intact 04/28/24 2000  Lumen #1 Status Infusing;Flushed 04/28/24 2000  Lumen #2 Status Infusing;Flushed 04/28/24 2000  Lumen #3 Status In-line blood sampling system in place;Infusing 04/28/24 2000  Dressing Type Transparent 04/28/24 2000  Dressing Status Antimicrobial disc/dressing in place 04/28/24 2000  Line Care Connections checked and tightened;Line pulled back;Leveled 04/27/24 0800  Line Adjustment (NICU/IV Team Only) No 04/21/24 0815  Dressing Intervention Dressing changed 04/25/24 0600  Dressing Change Due 05/02/24 04/28/24 2000     Arterial Line 04/23/24 Left Radial (Active)  Site Assessment Clean, Dry, Intact 04/28/24 2000  Line Status Pulsatile blood flow;Positional 04/28/24 2000  Art Line Waveform Appropriate 04/28/24 2000  Art Line Interventions Zeroed and calibrated 04/28/24 2000  Color/Movement/Sensation Capillary refill less than 3 sec 04/28/24 2000  Dressing Type Transparent 04/28/24 2000  Dressing Status Clean, Dry, Intact 04/28/24 0758  Dressing Change Due 04/30/24 04/28/24 2000     Negative Pressure Wound Therapy Knee Anterior;Left (Active)     Urethral Catheter [name], RN Latex 16 Fr. (Active)  Indication for Insertion or Continuance of Catheter Therapy based on hourly urine output monitoring and documentation for critical condition (NOT STRICT I&O) 04/28/24 2000  Site Assessment Clean, Dry, Intact 04/28/24 2000  Catheter Maintenance Bag below level of bladder;Catheter secured;No dependent loops;Insertion date on drainage bag;Drainage bag/tubing not touching floor 04/28/24 2000  Collection Container Standard drainage bag 04/28/24 2000  Securement Method Adhesive securement device 04/28/24 2000  Urinary Catheter Interventions (if applicable) Unclamped 04/28/24 2000  Output (mL) 20 mL 04/29/24 0749    Microbiology/Sepsis markers: Results for orders placed or performed during the hospital encounter of 04/17/24  MRSA Next Gen by PCR, Nasal     Status: None   Collection Time: 04/17/24  8:06 PM   Specimen: Nasal Mucosa; Nasal Swab  Result Value Ref Range Status   MRSA by PCR Next Gen NOT DETECTED NOT DETECTED Final    Comment: (NOTE) The GeneXpert MRSA Assay (FDA approved for NASAL specimens only), is one component of a comprehensive MRSA colonization surveillance program. It is not intended to diagnose MRSA infection nor to guide or monitor treatment for MRSA infections. Test performance is not FDA approved in patients less than 23 years old. Performed at [facility], [address], [place], [place] [address]   Culture, Respiratory w Gram Stain     Status: None   Collection Time: 04/22/24  1:08 PM   Specimen: Tracheal Aspirate; Respiratory  Result Value Ref Range Status   Specimen Description TRACHEAL ASPIRATE  Final   Special Requests NONE  Final   Gram Stain   Final    FEW  WBC PRESENT,BOTH PMN AND MONONUCLEAR MODERATE GRAM NEGATIVE RODS FEW GRAM POSITIVE COCCI RARE GRAM POSITIVE RODS Performed at [facility], [address], [place], [place] [address]    Culture MODERATE KLEBSIELLA AEROGENES  Final   Report Status  04/24/2024 FINAL  Final   Organism ID, Bacteria KLEBSIELLA AEROGENES  Final      Susceptibility   Klebsiella aerogenes - MIC*    CEFEPIME  <=0.12 SENSITIVE Sensitive     CEFTRIAXONE 32 RESISTANT Resistant     CIPROFLOXACIN <=0.06 SENSITIVE Sensitive     GENTAMICIN <=1 SENSITIVE Sensitive     MEROPENEM  <=0.25 SENSITIVE Sensitive     TRIMETH/SULFA <=20 SENSITIVE Sensitive     PIP/TAZO Value in next row Intermediate      64 INTERMEDIATEThis is a modified FDA-approved test that has been validated and its performance characteristics determined by the reporting laboratory.  This laboratory is certified under the Clinical Laboratory Improvement Amendments CLIA as qualified to perform high complexity clinical laboratory testing.    * MODERATE KLEBSIELLA AEROGENES  Culture, Respiratory w Gram Stain     Status: None (Preliminary result)   Collection Time: 04/28/24  9:02 AM   Specimen: Tracheal Aspirate; Respiratory  Result Value Ref Range Status   Specimen Description TRACHEAL ASPIRATE  Final   Special Requests NONE  Final   Gram Stain   Final    RARE WBC PRESENT, PREDOMINANTLY PMN FEW GRAM VARIABLE ROD RARE GRAM POSITIVE COCCI IN PAIRS Performed at [facility], [address], [place], [place] [address]    Culture PENDING  Incomplete   Report Status PENDING  Incomplete  Respiratory (~20 pathogens) panel by PCR     Status: None   Collection Time: 04/28/24  9:52 AM   Specimen: Nasopharyngeal Swab; Respiratory  Result Value Ref Range Status   Adenovirus NOT DETECTED NOT DETECTED Final   Coronavirus 229E NOT DETECTED NOT DETECTED Final    Comment: (NOTE) The Coronavirus on the Respiratory Panel, DOES NOT test for the novel  Coronavirus (2019 nCoV)    Coronavirus HKU1 NOT DETECTED NOT DETECTED Final   Coronavirus NL63 NOT DETECTED NOT DETECTED Final   Coronavirus OC43 NOT DETECTED NOT DETECTED Final   Metapneumovirus NOT DETECTED NOT DETECTED Final   Rhinovirus / Enterovirus NOT  DETECTED NOT DETECTED Final   Influenza A NOT DETECTED NOT DETECTED Final   Influenza B NOT DETECTED NOT DETECTED Final   Parainfluenza Virus 1 NOT DETECTED NOT DETECTED Final   Parainfluenza Virus 2 NOT DETECTED NOT DETECTED Final   Parainfluenza Virus 3 NOT DETECTED NOT DETECTED Final   Parainfluenza Virus 4 NOT DETECTED NOT DETECTED Final   Respiratory Syncytial Virus NOT DETECTED NOT DETECTED Final   Bordetella pertussis NOT DETECTED NOT DETECTED Final   Bordetella Parapertussis NOT DETECTED NOT DETECTED Final   Chlamydophila pneumoniae NOT DETECTED NOT DETECTED Final   Mycoplasma pneumoniae NOT DETECTED NOT DETECTED Final    Comment: Performed at [facility], [address] [place], [place] [address]    Anti-infectives:  Anti-infectives (From admission, onward)    Start     Dose/Rate Route Frequency Ordered Stop   04/29/24 1000  vancomycin  (VANCOREADY) IVPB 1250 mg/250 mL        1,250 mg 166.7 mL/hr over 90 Minutes Intravenous Every 24 hours 04/28/24 1053     04/28/24 1700  meropenem  (MERREM ) 1 g in sodium chloride  0.9 % 100 mL IVPB        1 g  200 mL/hr over 30 Minutes Intravenous Every 8 hours 04/28/24 1052     04/28/24 1000  meropenem  (MERREM ) 1 g in sodium chloride  0.9 % 100 mL IVPB  Status:  Discontinued        1 g 200 mL/hr over 30 Minutes Intravenous Every 12 hours 04/28/24 0827 04/28/24 1052   04/28/24 0915  vancomycin  (VANCOREADY) IVPB 2000 mg/400 mL        2,000 mg 200 mL/hr over 120 Minutes Intravenous  Once 04/28/24 0827 04/28/24 1149   04/22/24 1115  ceFEPIme  (MAXIPIME ) 2 g in sodium chloride  0.9 % 100 mL IVPB  Status:  Discontinued        2 g 200 mL/hr over 30 Minutes Intravenous Every 12 hours 04/22/24 1024 04/28/24 0827   04/20/24 2045  ceFAZolin  (ANCEF ) IVPB 2g/100 mL premix       Note to Pharmacy: Anesthesia to give preop   2 g 200 mL/hr over 30 Minutes Intravenous  Once 04/20/24 1948 04/20/24 2053      Consults: Treatment Team:  Darnella Dorn SAUNDERS, MD Gearline Norris, MD    Studies:    Events:  Subjective:    Overnight Issues:  CRRT pulled 850, levo up to 8 Objective:  Vital signs for last 24 hours: Temp:  [98.2 F (36.8 C)-99.3 F (37.4 C)] 98.2 F (36.8 C) (01/06 0400) Pulse Rate:  [52-121] 107 (01/06 0800) Resp:  [21-27] 23 (01/06 0800) BP: (90-140)/(53-72) 103/65 (01/06 0800) SpO2:  [91 %-100 %] 99 % (01/06 0800) Arterial Line BP: (105-170)/(40-79) 118/45 (01/06 0800) FiO2 (%):  [60 %] 60 % (01/06 0317) Weight:  [116.8 kg] 116.8 kg (01/06 0411)  Hemodynamic parameters for last 24 hours:    Intake/Output from previous day: 01/05 0701 - 01/06 0700 In: 3720 [I.V.:1380; NG/GT:1640; IV Piggyback:700] Out: 3478.5 [Urine:795]  Intake/Output this shift: Total I/O In: 110.4 [I.V.:60.4; NG/GT:50] Out: 220 [Urine:20]  Vent settings for last 24 hours: Vent Mode: PRVC FiO2 (%):  [60 %] 60 % Set Rate:  [20 bmp] 20 bmp Vt Set:  [560 mL] 560 mL PEEP:  [8 cmH20] 8 cmH20 Plateau Pressure:  [20 cmH20-21 cmH20] 21 cmH20  Physical Exam:  General: sedated Neuro: pupils 2mm, sedated Resp: rales bilaterally CVS: IRR GI: soft, NT Extremities: slightly less edema BLE BUE  Results for orders placed or performed during the hospital encounter of 04/17/24 (from the past 24 hours)  Culture, Respiratory w Gram Stain     Status: None (Preliminary result)   Collection Time: 04/28/24  9:02 AM   Specimen: Tracheal Aspirate; Respiratory  Result Value Ref Range   Specimen Description TRACHEAL ASPIRATE    Special Requests NONE    Gram Stain      RARE WBC PRESENT, PREDOMINANTLY PMN FEW GRAM VARIABLE ROD RARE GRAM POSITIVE COCCI IN PAIRS Performed at [facility], [address], [place], [place] [address]    Culture PENDING    Report Status PENDING   Respiratory (~20 pathogens) panel by PCR     Status: None   Collection Time: 04/28/24  9:52 AM   Specimen: Nasopharyngeal Swab; Respiratory  Result Value  Ref Range   Adenovirus NOT DETECTED NOT DETECTED   Coronavirus 229E NOT DETECTED NOT DETECTED   Coronavirus HKU1 NOT DETECTED NOT DETECTED   Coronavirus NL63 NOT DETECTED NOT DETECTED   Coronavirus OC43 NOT DETECTED NOT DETECTED   Metapneumovirus NOT DETECTED NOT DETECTED   Rhinovirus / Enterovirus NOT DETECTED NOT DETECTED   Influenza A  NOT DETECTED NOT DETECTED   Influenza B NOT DETECTED NOT DETECTED   Parainfluenza Virus 1 NOT DETECTED NOT DETECTED   Parainfluenza Virus 2 NOT DETECTED NOT DETECTED   Parainfluenza Virus 3 NOT DETECTED NOT DETECTED   Parainfluenza Virus 4 NOT DETECTED NOT DETECTED   Respiratory Syncytial Virus NOT DETECTED NOT DETECTED   Bordetella pertussis NOT DETECTED NOT DETECTED   Bordetella Parapertussis NOT DETECTED NOT DETECTED   Chlamydophila pneumoniae NOT DETECTED NOT DETECTED   Mycoplasma pneumoniae NOT DETECTED NOT DETECTED  Glucose, capillary     Status: Abnormal   Collection Time: 04/28/24 11:49 AM  Result Value Ref Range   Glucose-Capillary 150 (H) 70 - 99 mg/dL  Glucose, capillary     Status: Abnormal   Collection Time: 04/28/24  3:48 PM  Result Value Ref Range   Glucose-Capillary 161 (H) 70 - 99 mg/dL  Glucose, capillary     Status: Abnormal   Collection Time: 04/28/24  7:26 PM  Result Value Ref Range   Glucose-Capillary 164 (H) 70 - 99 mg/dL  Renal function panel     Status: Abnormal   Collection Time: 04/28/24  9:58 PM  Result Value Ref Range   Sodium 141 135 - 145 mmol/L   Potassium 4.6 3.5 - 5.1 mmol/L   Chloride 105 98 - 111 mmol/L   CO2 24 22 - 32 mmol/L   Glucose, Bld 159 (H) 70 - 99 mg/dL   BUN 91 (H) 8 - 23 mg/dL   Creatinine, Ser 7.66 (H) 0.61 - 1.24 mg/dL   Calcium  8.5 (L) 8.9 - 10.3 mg/dL   Phosphorus 3.4 2.5 - 4.6 mg/dL   Albumin  3.1 (L) 3.5 - 5.0 g/dL   GFR, Estimated 25 (L) >60 mL/min   Anion gap 13 5 - 15  Glucose, capillary     Status: Abnormal   Collection Time: 04/28/24 11:22 PM  Result Value Ref Range    Glucose-Capillary 176 (H) 70 - 99 mg/dL  Glucose, capillary     Status: Abnormal   Collection Time: 04/29/24  3:31 AM  Result Value Ref Range   Glucose-Capillary 137 (H) 70 - 99 mg/dL  Triglycerides     Status: None   Collection Time: 04/29/24  4:58 AM  Result Value Ref Range   Triglycerides 83 <150 mg/dL  Magnesium      Status: Abnormal   Collection Time: 04/29/24  4:58 AM  Result Value Ref Range   Magnesium  2.6 (H) 1.7 - 2.4 mg/dL  CBC     Status: Abnormal   Collection Time: 04/29/24  4:58 AM  Result Value Ref Range   WBC 16.2 (H) 4.0 - 10.5 K/uL   RBC 2.63 (L) 4.22 - 5.81 MIL/uL   Hemoglobin 8.3 (L) 13.0 - 17.0 g/dL   HCT 73.4 (L) 60.9 - 47.9 %   MCV 100.8 (H) 80.0 - 100.0 fL   MCH 31.6 26.0 - 34.0 pg   MCHC 31.3 30.0 - 36.0 g/dL   RDW 79.4 (H) 88.4 - 84.4 %   Platelets 207 150 - 400 K/uL   nRBC 3.3 (H) 0.0 - 0.2 %  Renal function panel (daily at 0500)     Status: Abnormal   Collection Time: 04/29/24  4:58 AM  Result Value Ref Range   Sodium 141 135 - 145 mmol/L   Potassium 4.2 3.5 - 5.1 mmol/L   Chloride 104 98 - 111 mmol/L   CO2 25 22 - 32 mmol/L   Glucose, Bld 166 (H) 70 -  99 mg/dL   BUN 82 (H) 8 - 23 mg/dL   Creatinine, Ser 7.96 (H) 0.61 - 1.24 mg/dL   Calcium  8.7 (L) 8.9 - 10.3 mg/dL   Phosphorus 2.9 2.5 - 4.6 mg/dL   Albumin  3.2 (L) 3.5 - 5.0 g/dL   GFR, Estimated 30 (L) >60 mL/min   Anion gap 13 5 - 15  APTT     Status: Abnormal   Collection Time: 04/29/24  4:58 AM  Result Value Ref Range   aPTT 43 (H) 24 - 36 seconds  Glucose, capillary     Status: Abnormal   Collection Time: 04/29/24  7:35 AM  Result Value Ref Range   Glucose-Capillary 156 (H) 70 - 99 mg/dL    Assessment & Plan: Present on Admission:  Critical polytrauma    LOS: 12 days   Additional comments:I reviewed the patient's new clinical lab test results. / Injuries: - Right femur fracture - SDH/SAH - Nondepressed parietoocipital bone fracture - Trace left basilar pneumothorax -  Bilateral rib fractures - Thoracic TP fractures - Possible T9-10 discoligamentous injury - Left psoas hemorrhage without extravasation  Plan: - Appreciate neurosurgery recommendations, Dr. Garst              - MRI thoracic spine done 1/2 - NS plan TLSO once sitting or standing  - Repeat [facility] 12/28 with stable ICH, but new occipital stroke, Dr. Merrianne consulted. MRI brain also done 1/2 - follow neuro exam - wanted to get F/U CT head prior to anticoag but he is not stable for that. See below  - Appreciate orthopaedics recommendations, Dr. Jerri, Dr. Kendal - ORIF right femur 12/29 (Dr. Jerri)  - Acute blood loss anemia   - Shock - likely hemorrhagic initially and now cardiogenic (CHF, afib s/p cardioversion and now amio), appreciate CCM management including pressor needs with CRRT  - acute hypoxic ventilator dependent respiratory failure - 60% and PEEP 8, appreciate CCM management  - ID - was on cefepime  for klebsiella PNA, re-CX 1/6 P and changed to vanc/Merrem . Resp panel neg.   - FEN - strict NPO, cortrak  - DVT - start heparin  drip, no bolus  - AKI and volume overload - CRRT, appreciate Dr.  Tod help  - Dispo - ICU, I met with Doug's wife at the bedside and updated her. Dr. Darnella joined the discussion. Plan heparin  drip with no bolus. I will meet with her and her son along with Dr. Claudene when he arrives.  Appreciate CCM collaboration  Critical Care Total Time*: 38 Minutes  Dann Hummer, MD, MPH, FACS Trauma & General Surgery Use [email or web address] to contact on call provider  04/29/2024  *Care during the described time interval was provided by me. I have reviewed this patient's available data, including medical history, events of note, physical examination and test results as part of my evaluation.

## 2024-04-29 NOTE — Progress Notes (Signed)
 Dr. Sebastian and Dr. Claudene notified of decreased urine output of 27 mL total from 1000 to 1600. No new orders.

## 2024-04-30 ENCOUNTER — Inpatient Hospital Stay (HOSPITAL_COMMUNITY)

## 2024-04-30 ENCOUNTER — Encounter (HOSPITAL_COMMUNITY): Payer: Self-pay | Admitting: Orthopaedic Surgery

## 2024-04-30 DIAGNOSIS — J150 Pneumonia due to Klebsiella pneumoniae: Secondary | ICD-10-CM | POA: Diagnosis not present

## 2024-04-30 DIAGNOSIS — T07XXXA Unspecified multiple injuries, initial encounter: Secondary | ICD-10-CM | POA: Diagnosis not present

## 2024-04-30 DIAGNOSIS — I4891 Unspecified atrial fibrillation: Secondary | ICD-10-CM | POA: Diagnosis not present

## 2024-04-30 DIAGNOSIS — Z515 Encounter for palliative care: Secondary | ICD-10-CM | POA: Diagnosis not present

## 2024-04-30 DIAGNOSIS — J9600 Acute respiratory failure, unspecified whether with hypoxia or hypercapnia: Secondary | ICD-10-CM | POA: Diagnosis not present

## 2024-04-30 DIAGNOSIS — D62 Acute posthemorrhagic anemia: Secondary | ICD-10-CM | POA: Diagnosis not present

## 2024-04-30 LAB — CBC
HCT: 25.9 % — ABNORMAL LOW (ref 39.0–52.0)
Hemoglobin: 7.9 g/dL — ABNORMAL LOW (ref 13.0–17.0)
MCH: 31 pg (ref 26.0–34.0)
MCHC: 30.5 g/dL (ref 30.0–36.0)
MCV: 101.6 fL — ABNORMAL HIGH (ref 80.0–100.0)
Platelets: 247 K/uL (ref 150–400)
RBC: 2.55 MIL/uL — ABNORMAL LOW (ref 4.22–5.81)
RDW: 20.2 % — ABNORMAL HIGH (ref 11.5–15.5)
WBC: 17.1 K/uL — ABNORMAL HIGH (ref 4.0–10.5)
nRBC: 4.8 % — ABNORMAL HIGH (ref 0.0–0.2)

## 2024-04-30 LAB — HEPARIN LEVEL (UNFRACTIONATED)
Heparin Unfractionated: 0.53 [IU]/mL (ref 0.30–0.70)
Heparin Unfractionated: 0.41 [IU]/mL (ref 0.30–0.70)

## 2024-04-30 LAB — RENAL FUNCTION PANEL
Albumin: 2.8 g/dL — ABNORMAL LOW (ref 3.5–5.0)
Albumin: 3.4 g/dL — ABNORMAL LOW (ref 3.5–5.0)
Anion gap: 11 (ref 5–15)
Anion gap: 12 (ref 5–15)
BUN: 57 mg/dL — ABNORMAL HIGH (ref 8–23)
BUN: 55 mg/dL — ABNORMAL HIGH (ref 8–23)
CO2: 21 mmol/L — ABNORMAL LOW (ref 22–32)
CO2: 23 mmol/L (ref 22–32)
Calcium: 7.5 mg/dL — ABNORMAL LOW (ref 8.9–10.3)
Calcium: 8.6 mg/dL — ABNORMAL LOW (ref 8.9–10.3)
Chloride: 105 mmol/L (ref 98–111)
Chloride: 101 mmol/L (ref 98–111)
Creatinine, Ser: 1.38 mg/dL — ABNORMAL HIGH (ref 0.61–1.24)
Creatinine, Ser: 1.42 mg/dL — ABNORMAL HIGH (ref 0.61–1.24)
GFR, Estimated: 48 mL/min — ABNORMAL LOW
GFR, Estimated: 46 mL/min — ABNORMAL LOW
Glucose, Bld: 187 mg/dL — ABNORMAL HIGH (ref 70–99)
Glucose, Bld: 231 mg/dL — ABNORMAL HIGH (ref 70–99)
Phosphorus: 2.1 mg/dL — ABNORMAL LOW (ref 2.5–4.6)
Phosphorus: 2.9 mg/dL (ref 2.5–4.6)
Potassium: 3.8 mmol/L (ref 3.5–5.1)
Potassium: 4.3 mmol/L (ref 3.5–5.1)
Sodium: 137 mmol/L (ref 135–145)
Sodium: 136 mmol/L (ref 135–145)

## 2024-04-30 LAB — GLUCOSE, CAPILLARY
Glucose-Capillary: 143 mg/dL — ABNORMAL HIGH (ref 70–99)
Glucose-Capillary: 150 mg/dL — ABNORMAL HIGH (ref 70–99)
Glucose-Capillary: 154 mg/dL — ABNORMAL HIGH (ref 70–99)
Glucose-Capillary: 161 mg/dL — ABNORMAL HIGH (ref 70–99)
Glucose-Capillary: 165 mg/dL — ABNORMAL HIGH (ref 70–99)
Glucose-Capillary: 189 mg/dL — ABNORMAL HIGH (ref 70–99)

## 2024-04-30 LAB — MAGNESIUM: Magnesium: 2.2 mg/dL (ref 1.7–2.4)

## 2024-04-30 MED ORDER — AMIODARONE HCL IN DEXTROSE 360-4.14 MG/200ML-% IV SOLN
60.0000 mg/h | INTRAVENOUS | Status: DC
  Administered 2024-04-30 – 2024-05-05 (×17): 60 mg/h via INTRAVENOUS
  Filled 2024-04-30 (×10): qty 200
  Filled 2024-04-30: qty 400
  Filled 2024-04-30 (×7): qty 200

## 2024-04-30 MED ORDER — AMIODARONE LOAD VIA INFUSION
150.0000 mg | Freq: Once | INTRAVENOUS | Status: DC
  Filled 2024-04-30: qty 83.34

## 2024-04-30 MED ORDER — AMIODARONE LOAD VIA INFUSION
150.0000 mg | Freq: Once | INTRAVENOUS | Status: AC
  Administered 2024-04-30: 150 mg via INTRAVENOUS
  Filled 2024-04-30: qty 83.34

## 2024-04-30 MED ORDER — SODIUM PHOSPHATES 45 MMOLE/15ML IV SOLN
15.0000 mmol | Freq: Once | INTRAVENOUS | Status: AC
  Administered 2024-04-30: 15 mmol via INTRAVENOUS
  Filled 2024-04-30: qty 5

## 2024-04-30 MED ORDER — AMIODARONE IV BOLUS ONLY 150 MG/100ML: 150.0000 mg | Freq: Once | INTRAVENOUS | Status: DC

## 2024-04-30 NOTE — Progress Notes (Signed)
 PHARMACY - ANTICOAGULATION CONSULT NOTE  Pharmacy Consult for heparin  gtt Indication: VTE treatment  Allergies[1]  Patient Measurements: Height: 5' 11 (180.3 cm) Weight: 116.8 kg (257 lb 8 oz) IBW/kg (Calculated) : 75.3 Heparin  dosing weight 99.5 kg  Vital Signs: Temp: 98.9 F (37.2 C) (01/07 0400) Temp Source: Axillary (01/07 0400) BP: 100/61 (01/07 0515) Pulse Rate: 115 (01/07 0515)  Labs: Recent Labs    04/28/24 0344 04/28/24 0524 04/28/24 2158 04/29/24 0458 04/29/24 1600 04/29/24 1657 04/29/24 1658 04/30/24 0438  HGB 8.2*  --   --  8.3*  --   --   --  7.9*  HCT 26.3*  --   --  26.5*  --   --   --  25.9*  PLT 145*  --   --  207  --   --   --  247  APTT  --   --   --  43*  --  101*  --   --   HEPARINUNFRC  --   --   --   --   --   --  0.63 0.53  CREATININE  --    < > 2.33* 2.03* 1.80*  --   --   --    < > = values in this interval not displayed.    Estimated Creatinine Clearance: 33.3 mL/min (A) (by C-G formula based on SCr of 1.8 mg/dL (H)).   Assessment: 89 yo M polytrauma including bilateral SDH and scattered SAH. Pt was found to have acute DVT in R and L legs. Notably, there is also large hematoma in groin throughout thigh. Pharmacy consulted for heparin  gtt for VTE treatment. Low goal, no bolus.  AM: heparin  level 0.53 slightly above lower goal of 0.3-0.5 (came down from 0.63). Gtt was paused ~30 minutes ~0400-0430. No bleeding per RN. CBC shows Hgb low, stable and plts 247.   Goal of Therapy:  Heparin  level 0.3-0.5 units/ml Monitor platelets by anticoagulation protocol: Yes   Plan:  Decrease heparin  gtt to 1250 units/hr 8hr heparin  level  Lynwood Poplar, PharmD, BCPS Clinical Pharmacist 04/30/2024 5:39 AM        [1]  Allergies Allergen Reactions   Bee Venom Anaphylaxis   Doxycycline  Hyclate Anaphylaxis   Yellow Jacket Venom Anaphylaxis   Atorvastatin Other (See Comments)   Doxycycline  Hyclate     Other Reaction(s): tachycardia/afib    Finasteride     Other Reaction(s): weak urinary stream   Sulfamethoxazole     Other Reaction(s): rash/tachycardia/hypotension   Bactrim [Sulfamethoxazole-Trimethoprim] Hives and Rash   Rivaroxaban  Rash

## 2024-04-30 NOTE — Progress Notes (Signed)
 [facility] Progress Note   Assessment/ Plan:   Assessment/Plan  AKI and volume overload: - making urine but not enough to make headway with volume unloading - will do CRRT- all 4K, will stop syringe hep since on a gtt now - time- limited trial- no more than 72 hrs - tolerating UF and FIO2 coming down - think tomorrow we reassess and see if can come off   2.  Critical polytrauma             - after falling off deck on christmas             - per trauma team   3.  Afib with RVR             - hopefully volume unloading will improve this             - on amio   4.  Acute hypoxic RF:             - volume mediates some             - hoepfully we can get some fluid off to give him the best chance at extubation  5.  Acute R DVT  - on systemic gtt now   5.  Dispo:             - In ICU  Subjective:    Seen and examined.  On CRRT.  Still grossly volume overloaded but making a little headway.  Son at bedside.  New DVT leg.  On systemic hep gtt now    Objective:   BP (!) 83/55   Pulse (!) 123   Temp 97.6 F (36.4 C) (Axillary)   Resp (!) 27   Ht 5' 11 (1.803 m)   Wt 112.6 kg   SpO2 93%   BMI 34.62 kg/m   Intake/Output Summary (Last 24 hours) at 04/30/2024 1140 Last data filed at 04/30/2024 1022 Gross per 24 hour  Intake 3619.52 ml  Output 7961.7 ml  Net -4342.18 ml   Weight change: -4.2 kg  Physical Exam: Gen ill appearing elderly gentleman Heent ETT in place Neck R nontunneled HD cath PULM bilateral wet sounds, somewhat improved CV RRR ABD soft EXT 4 limb anasarca NEURO intubated, sedated ACCESS R internal jugular nontunneled HD cath  Imaging: DG CHEST PORT 1 VIEW Result Date: 04/30/2024 EXAM: 1 VIEW XRAY OF THE CHEST 04/30/2024 05:38:47 AM COMPARISON: Portable chest 04/28/2024. CLINICAL HISTORY: Respiratory failure (HCC). FINDINGS: LINES, TUBES AND DEVICES: Right IJ catheter has tip in the distal SVC. Feeding tube is well into the stomach but the  radiopaque tip is not seen. ETT tip is 3 cm from the carina. LUNGS AND PLEURA: Perihilar vascular congestion, and interstitial edema in the lower zones of the lungs shows improvement. There are small pleural effusions. Patchy lower zonal airspace disease is also less dense today. The upper lung fields are essentially clear. No pneumothorax. HEART AND MEDIASTINUM: Stable cardiomegaly. The mediastinum is stable with aortic uncoiling and atherosclerosis. BONES AND SOFT TISSUES: Advanced thoracic spondylosis. No acute osseous abnormality. IMPRESSION: 1. Right IJ catheter tip in the distal SVC, endotracheal tube tip 3 cm above the carina, and feeding tube courses into the stomach with the radiopaque tip nonvisualized. 2. Improved perihilar vascular congestion, interstitial edema, and patchy lower zonal airspace disease. 3. Small pleural effusions. 4. Stable cardiomegaly. Electronically signed by: Francis Quam MD 04/30/2024 07:48 AM EST RP Workstation: [id number]   VAS US  LOWER EXTREMITY  VENOUS (DVT) Result Date: 04/29/2024  Lower Venous DVT Study Patient Name:  Tom Norton  Date of Exam:   04/28/2024 Medical Rec #: [id number]         Accession #:    [id number] Date of Birth: 1929/09/25          Patient Gender: M Patient Age:   20 years Exam Location:  [facility] Procedure:      VAS US  LOWER EXTREMITY VENOUS (DVT) Referring Phys: TORIBIO SHARPS --------------------------------------------------------------------------------  Indications: Edema. Other Indications: Status post fall from roof 04/17/25, resulting in polytrauma                    including SAH, SDH, Right femur fracture, and skull fracture.                    Status post open treatment of basicervical intertrochanteric                    and subtrochanteric fracture with intramedullary implant of                    right femur 04/21/24 Risk Factors: Atrial fibrillation on Eliquis  prior to admission. Limitations: Significant lower extremity edema,  large hematoma in the right thigh, ventilation, vasopressors, and CRRT. SABRA Comparison Study: Prior negative right LEV done 04/22/24 Performing Technologist: Alberta Lis RVS  Examination Guidelines: A complete evaluation includes B-mode imaging, spectral Doppler, color Doppler, and power Doppler as needed of all accessible portions of each vessel. Bilateral testing is considered an integral part of a complete examination. Limited examinations for reoccurring indications may be performed as noted. The reflux portion of the exam is performed with the patient in reverse Trendelenburg.  +---------+---------------+---------+-----------+----------+-------------------+ RIGHT    CompressibilityPhasicitySpontaneityPropertiesThrombus Aging      +---------+---------------+---------+-----------+----------+-------------------+ CFV      Full           Yes      No                                       +---------+---------------+---------+-----------+----------+-------------------+ SFJ      Full                                                             +---------+---------------+---------+-----------+----------+-------------------+ FV Prox  Full           Yes      No                                       +---------+---------------+---------+-----------+----------+-------------------+ FV Mid   Full                                                             +---------+---------------+---------+-----------+----------+-------------------+ FV DistalFull           Yes      No                                       +---------+---------------+---------+-----------+----------+-------------------+  PFV      Full           Yes      No                                       +---------+---------------+---------+-----------+----------+-------------------+ POP      None           No       No                   Acute                +---------+---------------+---------+-----------+----------+-------------------+ PTV      None                                         Acute               +---------+---------------+---------+-----------+----------+-------------------+ PERO                                                  Not well visualized +---------+---------------+---------+-----------+----------+-------------------+ Soleal   None                                         Acute               +---------+---------------+---------+-----------+----------+-------------------+ Gastroc  None                                         Acute               +---------+---------------+---------+-----------+----------+-------------------+   +---------+---------------+---------+-----------+----------+-----------------+ LEFT     CompressibilityPhasicitySpontaneityPropertiesThrombus Aging    +---------+---------------+---------+-----------+----------+-----------------+ CFV      Full           Yes      No                                     +---------+---------------+---------+-----------+----------+-----------------+ SFJ      Full                                                           +---------+---------------+---------+-----------+----------+-----------------+ FV Prox  Full           Yes      No                                     +---------+---------------+---------+-----------+----------+-----------------+ FV Mid   Full           Yes      Yes                                    +---------+---------------+---------+-----------+----------+-----------------+  FV DistalFull           Yes      No                                     +---------+---------------+---------+-----------+----------+-----------------+ PFV      Full           Yes      No                                     +---------+---------------+---------+-----------+----------+-----------------+ POP      Full           Yes       No                                     +---------+---------------+---------+-----------+----------+-----------------+ PTV      None                                         Age Indeterminate +---------+---------------+---------+-----------+----------+-----------------+ PERO     None                                         Age Indeterminate +---------+---------------+---------+-----------+----------+-----------------+     Summary: RIGHT: - Findings consistent with acute deep vein thrombosis involving the right popliteal vein, and right posterior tibial veins. Findings consistent with acute intramuscular thrombosis involving the right gastrocnemius veins, and right soleal veins. - No cystic structure found in the popliteal fossa. - Large hematoma noted from groin throughout thigh Subcutaneous edema throughout  LEFT: - Findings consistent with age indeterminate deep vein thrombosis involving the left posterior tibial veins, and left peroneal veins.  - No cystic structure found in the popliteal fossa. Subcutaneous edema throughout.  *See table(s) above for measurements and observations. Electronically signed by Lonni Gaskins MD on 04/29/2024 at 9:27:33 AM.    Final     Labs: BMET Recent Labs  Lab 04/26/24 9463 04/26/24 1301 04/27/24 9461 04/27/24 9144 04/27/24 1537 04/28/24 0524 04/28/24 2158 04/29/24 0458 04/29/24 1600 04/30/24 0438  NA 146*   < > 145 143 143 143 141 141 138 137  K 3.6   < > 3.8 4.0 4.0 4.7 4.6 4.2 4.7 3.8  CL 113*   < > 109  --  106 106 105 104 103 105  CO2 22   < > 24  --  24 24 24 25 24  21*  GLUCOSE 193*   < > 175*  --  180* 128* 159* 166* 152* 187*  BUN 67*   < > 82*  --  89* 102* 91* 82* 73* 57*  CREATININE 1.48*   < > 1.86*  --  2.15* 2.65* 2.33* 2.03* 1.80* 1.38*  CALCIUM  8.4*   < > 8.2*  --  8.5* 8.6* 8.5* 8.7* 8.5* 7.5*  PHOS 3.2  3.2  --  2.8  --   --  4.0 3.4 2.9 2.8 2.1*   < > = values in this interval not displayed.   CBC Recent Labs  Lab  04/26/24 2109 04/27/24 0538 04/27/24  9144 04/28/24 0344 04/29/24 0458 04/30/24 0438  WBC 16.5* 14.6*  --  16.7* 16.2* 17.1*  NEUTROABS 13.5*  --   --   --   --   --   HGB 8.2* 7.9* 8.8* 8.2* 8.3* 7.9*  HCT 24.8* 24.3* 26.0* 26.3* 26.5* 25.9*  MCV 95.4 96.0  --  100.4* 100.8* 101.6*  PLT 113* 115*  --  145* 207 247    Medications:     sodium chloride    Intravenous Once   acetaminophen  (TYLENOL ) oral liquid 160 mg/5 mL  650 mg Per Tube Q6H   amiodarone   150 mg Intravenous Once   artificial tears   Both Eyes QID   Chlorhexidine  Gluconate Cloth  6 each Topical Q0600   docusate  100 mg Per Tube BID   feeding supplement (PROSource TF20)  60 mL Per Tube TID   insulin  aspart  0-15 Units Subcutaneous Q4H   insulin  glargine-yfgn  8 Units Subcutaneous Daily   levothyroxine   50 mcg Per Tube QAC breakfast   midodrine   10 mg Per Tube TID WC   mouth rinse  15 mL Mouth Rinse Q2H   oxyCODONE   5 mg Per Tube Q6H   pantoprazole  (PROTONIX ) IV  40 mg Intravenous Q24H   polyethylene glycol  17 g Per Tube Daily   sodium chloride  flush  10-40 mL Intracatheter Q12H    Almarie Bonine, MD 04/30/2024, 11:40 AM

## 2024-04-30 NOTE — Progress Notes (Signed)
 " Daily Progress Note   Date: 04/30/2024   Patient Name: Tom Norton  DOB: 19-Mar-1930  MRN: [id number]  Age / Sex: 89 y.o., male  Attending Physician: Rolla Seltzer, MD Primary Care Physician: Charlott Dorn LABOR, MD Admit Date: 04/17/2024 Length of Stay: 13 days  Reason for Follow-up: Establishing goals of care  Past Medical History:  Diagnosis Date   BPH (benign prostatic hyperplasia)    Cardiomegaly    Dilated aortic root    a. 09/2013: 4.2cm by echo.   Dysrhythmia    Enlarged prostate without lower urinary tract symptoms (luts)    GERD (gastroesophageal reflux disease)    rare   History of hiatal hernia    Hyperlipidemia    Hypertension    Hypothyroidism    Insomnia    Junctional bradycardia    OA (osteoarthritis)    Obesity    Paroxysmal A-fib (HCC)    a. 09/23/13 a-fib with RVR and hypotensive, requiring emergent cardioversion in ED   Peripheral neuropathy 07/17/2017   Vitamin D  deficiency     Assessment & Plan:   HPI/Patient Profile:   89 y.o. male  with past medical history of HLD, HTN, osteoarthritis, paroxysmal atrial fibrillation on eliquis  admitted on 04/17/2024  with SDH, SAH, and right femur fracture 2/2 fall from balcony. CT head 04/17/2024 w/o contrast demonstrated left posterior SDH up to 9 mm thickness, multifocal bilateral SAH, linear skull fracture of left parietooccipital bone, right posterior scalp hematoma. Repeat CT head on 04/18/2024 demonstrate increase in multifocal hemorrhage, now bilateral SDH, increased SAH volume, minimal midline shift has increased. Per neurosurgery note by Darnella MD on 04/17/2024 patient not a candidate for hematoma evacuation surgery if his hemorrhages were to expand. Patient remains intubated since 04/21/2024 s/p ORIF for femur. CRRT initiated on 04/29/2023 for AKI. Patient on multiple vasopressors.   Palliative medicine consulted for goals of care conversation.  SUMMARY OF RECOMMENDATIONS Full code, full scope HCPOA in order  - Tom Norton, Tom Norton, Tom Norton Plan for further goals of care discussion after CRRT discontinued, will discuss advance directives with family again Advance Directives (in EMR) Patient's Advance Directives states  If 2 physicians, one of which is a Dispensing Optician, determine that I lack capacity to make or communicate health care decisions and: I have an incurable or irreversible condition that will result in my death within a relatively short period of time I become unconscious and my health care providers determine that, to a high degree of medical certainty, I will never regain my consciousness I suffer from advanced dementia or any other condition which results in the substantial loss of my cognitive ability and my healthcare providers determine that, to a high degree of medical certainty, this loss is not reversible In those situations I have stated in section 1, I direct that my health care providers: Shall withhold or withdraw life-prolonging measures Exceptions - Artificial Nutrition or Hydration No nutrition or hydration is desired if artificial life support is withheld or withdrawn I wish to be made as comfortable as possible I direct that my health care providers take reasonable steps to keep me as clean, comfortable, and free of pain as possible so that my dignity is maintained, even though this care may hasten my death If I have appointed a health care agent by executing a health care power of attorney or similar instrument, and that health care agent is acting and available and gives instructions that differ from this Advance Directive, then  I direct that: This Advance Directive will OVERRIDE instructions my health care agent gives about prolonging my life  Symptom Management:  Per primary team  Code Status: Full Code  Prognosis: Unable to determine  Discharge Planning: To Be Determined  Subjective:   Subjective: Chart Reviewed. Updates  received. Patient Assessed. Created space and opportunity for patient  and family to explore thoughts and feelings regarding current medical situation.  Patient remains on vasopressin  0.03 units/min and norepinephrine  5 mcg/min.  Patient on propofol  5 mcg/kg/min.   Today's Discussion:  Met with the patient at bedside without any visitors. Discussed with primary RN who shares that patient remains stable but critically ill with ventilator support, CRRT, and vasopressors. RN shares that family continues to remain hopeful for recovery.  Patient is sedated on low dose of propofol .  Review of Systems  Unable to perform ROS   Objective:   Primary Diagnoses: Present on Admission:  Critical polytrauma  Vent Mode: PRVC FiO2 (%):  [40 %-50 %] 40 % Set Rate:  [20 bmp] 20 bmp Vt Set:  [560 mL] 560 mL PEEP:  [8 cmH20] 8 cmH20 Plateau Pressure:  [20 cmH20] 20 cmH20  Vital Signs:  BP (!) 140/87   Pulse (!) 115   Temp 99.4 F (37.4 C) (Oral)   Resp (!) 25   Ht 5' 11 (1.803 m)   Wt 112.6 kg   SpO2 98%   BMI 34.62 kg/m   Physical Exam Constitutional:      Appearance: He is ill-appearing.  HENT:     Head: Normocephalic.     Nose: Nose normal.     Mouth/Throat:     Comments: ET tube in place Cardiovascular:     Rate and Rhythm: Tachycardia present.  Pulmonary:     Comments: On ventilator support Neurological:     Comments: Sedated     Palliative Assessment/Data: 30%   Existing Vynca/ACP Documentation: Advance directive and healthcare power of attorney in electronic medical record, see above  Thank you for allowing us  to participate in the care of Tom Norton PMT will continue to support holistically.  I personally spent a total of 25 minutes in the care of the patient today including preparing to see the patient, getting/reviewing separately obtained history, and documenting clinical information in the EHR.  Tom Norton Tom Norton  Palliative Medicine Team  Team Phone #  [phone] (Nights/Weekends) 04/30/2024 3:49 PM  "

## 2024-04-30 NOTE — Progress Notes (Signed)
 PHARMACY - ANTICOAGULATION CONSULT NOTE  Pharmacy Consult for heparin  gtt Indication: VTE treatment  Allergies[1]  Patient Measurements: Height: 5' 11 (180.3 cm) Weight: 112.6 kg (248 lb 3.8 oz) IBW/kg (Calculated) : 75.3 Heparin  dosing weight 99.5 kg  Vital Signs: Temp: 99.4 F (37.4 C) (01/07 1200) Temp Source: Oral (01/07 1200) BP: 140/87 (01/07 1530) Pulse Rate: 115 (01/07 1530)  Labs: Recent Labs    04/28/24 0344 04/28/24 0524 04/29/24 0458 04/29/24 1600 04/29/24 1657 04/29/24 1658 04/30/24 0438 04/30/24 1434  HGB 8.2*  --  8.3*  --   --   --  7.9*  --   HCT 26.3*  --  26.5*  --   --   --  25.9*  --   PLT 145*  --  207  --   --   --  247  --   APTT  --   --  43*  --  101*  --   --   --   HEPARINUNFRC  --   --   --   --   --  0.63 0.53 0.41  CREATININE  --    < > 2.03* 1.80*  --   --  1.38*  --    < > = values in this interval not displayed.    Estimated Creatinine Clearance: 42.7 mL/min (A) (by C-G formula based on SCr of 1.38 mg/dL (H)).   Assessment: 89 yo M polytrauma including bilateral SDH and scattered SAH. Pt was found to have acute DVT in R and L legs. Notably, there is also large hematoma in groin throughout thigh. Pharmacy consulted for heparin  gtt for VTE treatment. Low goal, no bolus.  HL 0.41 - therapeutic  Goal of Therapy:  Heparin  level 0.3-0.5 units/ml Monitor platelets by anticoagulation protocol: Yes   Plan:  Continue heparin  gtt at 1250 units/hr Daily HL, CBC  F/u s/sx bleeding and longterm anticoag plan  Sharyne Glatter, PharmD, BCCCP Critical Care Clinical Pharmacist 04/30/2024 3:47 PM       [1]  Allergies Allergen Reactions   Bee Venom Anaphylaxis   Doxycycline  Hyclate Anaphylaxis   Yellow Jacket Venom Anaphylaxis   Atorvastatin Other (See Comments)   Doxycycline  Hyclate     Other Reaction(s): tachycardia/afib   Finasteride     Other Reaction(s): weak urinary stream   Sulfamethoxazole     Other Reaction(s):  rash/tachycardia/hypotension   Bactrim [Sulfamethoxazole-Trimethoprim] Hives and Rash   Rivaroxaban  Rash

## 2024-04-30 NOTE — Progress Notes (Signed)
 "  NAME:  Tom Norton, MRN:  [id number], DOB:  April 27, 1929, LOS: 13 ADMISSION DATE:  04/17/2024, CONSULTATION DATE: 04/22/2024 REFERRING MD:  Trauma, CHIEF COMPLAINT:  Shock   History of Present Illness:  89 year old man admitted as polytrauma after fall from deck with multiple orthopedic injuries, fracture, whom we are consulted for hypoxemia and shock.  Patient was admitted Christmas Day after fall from deck per report.  Initial images reveal left-sided rib fractures, right femur fracture, possible iliac fractures, left posterior subdural hematoma no midline shift, small left posterior parafalcine subdural hematoma, traumatic subarachnoid hemorrhage, skull fracture, trace left apical pneumothorax.  Chest x-ray 12/28 demonstrated left-sided atelectasis and small pleural effusion on my review interpretation.  He went for femur repair 12/29.  He named and remains intubated after procedure.  Chest x-ray 12/29 reveals ET tube placement, similar right-sided infiltrates and atelectasis, new rounded left-sided infiltrate on my review interpretation.  He developed shock now on maximum dose norepinephrine , vasopressin , started on phenylephrine  overnight.  He remains intubated.  Minimal vent settings.  He is on Precedex  for sedation.  Weaning Precedex .  Blood pressure get a bit better.  Review of labs reveal serial low hemoglobins.  He is getting a transfusion today.  He has had transfusions earlier this admission.  Pertinent Medical History:   Past Medical History:  Diagnosis Date   BPH (benign prostatic hyperplasia)    Cardiomegaly    Dilated aortic root    a. 09/2013: 4.2cm by echo.   Dysrhythmia    Enlarged prostate without lower urinary tract symptoms (luts)    GERD (gastroesophageal reflux disease)    rare   History of hiatal hernia    Hyperlipidemia    Hypertension    Hypothyroidism    Insomnia    Junctional bradycardia    OA (osteoarthritis)    Obesity    Paroxysmal A-fib (HCC)    a.  09/23/13 a-fib with RVR and hypotensive, requiring emergent cardioversion in ED   Peripheral neuropathy 07/17/2017   Vitamin D  deficiency    Significant Hospital Events: Including procedures, antibiotic start and stop dates in addition to other pertinent events   12/25 admitted after fall 12/29 OR for femur fracture repair  12/30 PCCM consult, AFIB RVR overnight 1/5 RIJ Trialysis, CRRT started  Interim History / Subjective:  Tolerated additional fluid removal CVP coming down O2 needs also a little better Remains sedated on vent and over-breathing Afib with intermittent RVR  Objective:   Blood pressure 124/75, pulse (!) 121, temperature 97.6 F (36.4 C), temperature source Axillary, resp. rate (!) 31, height 5' 11 (1.803 m), weight 112.6 kg, SpO2 93%. CVP:  [8 mmHg-19 mmHg] 8 mmHg  Vent Mode: PRVC FiO2 (%):  [40 %-50 %] 40 % Set Rate:  [20 bmp] 20 bmp Vt Set:  [560 mL] 560 mL PEEP:  [5 cmH20-8 cmH20] 5 cmH20 Plateau Pressure:  [20 cmH20] 20 cmH20   Intake/Output Summary (Last 24 hours) at 04/30/2024 0824 Last data filed at 04/30/2024 0800 Gross per 24 hour  Intake 3891.91 ml  Output 8101.9 ml  Net -4209.99 ml   Filed Weights   04/28/24 0336 04/29/24 0411 04/30/24 0500  Weight: 116 kg 116.8 kg 112.6 kg   Physical Examination: Ill appearing elderly man Heart irregular Improved slightly anasarca Scrotal swelling stable Not follow commands on current sedation level Overbreathing vent and occasional accessory muscle use Abd soft, hypoactive BS   amiodarone  30 mg/hr (04/30/24 0800)   dexmedetomidine  (PRECEDEX ) IV infusion 0.5 mcg/kg/hr (  04/30/24 0800)   feeding supplement (PIVOT 1.5 CAL) 50 mL/hr at 04/30/24 0800   heparin  1,250 Units/hr (04/30/24 0800)   meropenem  (MERREM ) IV Stopped (04/30/24 0137)   norepinephrine  (LEVOPHED ) Adult infusion 5 mcg/min (04/30/24 0800)   prismasol  BGK 4/2.5 400 mL/hr at 04/30/24 0353   prismasol  BGK 4/2.5 1,500 mL/hr at 04/30/24 0631    prismasol  BGK 4/2.5 400 mL/hr at 04/30/24 0352   propofol  (DIPRIVAN ) infusion 5 mcg/kg/min (04/30/24 0747)   sodium PHOSPHATE  IVPB (in mmol)     vancomycin  Stopped (04/29/24 1032)   vasopressin  0.03 Units/min (04/30/24 0800)   Patient Lines/Drains/Airways Status     Active Line/Drains/Airways     Name Placement date Placement time Site Days   Arterial Line 04/23/24 Left Radial 04/23/24  0240  Radial  7   Peripheral IV 04/17/24 20 G Anterior;Distal;Left Forearm 04/17/24  1849  Forearm  13   PICC Triple Lumen 04/17/24 Right Brachial 40 cm 0 cm 04/17/24  2320  -- 13   Hemodialysis Catheter Right Internal jugular Triple lumen Temporary (Non-Tunneled) 04/28/24  1000  Internal jugular  2   Negative Pressure Wound Therapy Knee Anterior;Left 02/18/21  1708  --  1167   Urethral Catheter [name], RN Latex 16 Fr. 04/18/24  0000  Latex  12   Airway 7.5 mm 04/21/24  1211  -- 9   Small Bore Feeding Tube 10 Fr. Left nare Marking at nare/corner of mouth 61 cm 04/18/24  1610  Left nare  12   Wound 04/21/24 1333 Surgical Closed Surgical Incision Hip Right 04/21/24  1333  Hip  9   Wound 04/26/24 0803 Atypical Pretibial Right;Lateral;Lower 04/26/24  0803  Pretibial  4   Wound 04/26/24 0807 Atypical Pretibial Right;Anterior 04/26/24  0807  Pretibial  4   Wound 04/26/24 0807 Pretibial Distal;Right;Anterior 04/26/24  0807  Pretibial  4   Wound 04/26/24 0807 Ankle Anterior;Right 04/26/24  0807  Ankle  4           Assessment and Plan:   Polytrauma (TBI, rib fx, spinal fx, psoas hematoma, R femur fx post ORIF) Type 1 resp failure- related to HCAP, volume overload Acute renal failure with total body fluid overload- unable to diurese so now on trial of CRRT Ongoing vasoplegia- related to sedation mostly Klebsiella HCAP on cefepime  x 7 days, broadened to vanc/meropenem  1/5 due to worsening O2 needs and CXR appearance Anemia- mostly phlebotomy at this point, no recent transfusion  needs Afib/RVR related to atrial stretch, improved with amio LE DVT- not helping things, would not be surprised if has some PE's  Overall goal here is to push fluid removal to see if we can get him off vent and have GOC conversation.  If cannot get to euvolemia and/or unable to wean from vent I think we are at end of life.  Antibiotics have been broadened, we are awaiting final culture data from tracheal aspirate 1/5  - Levo/vaso for MAP 65 - Continue midodrine  - Trend WBC, fever curve - F/u Cx data, continue vanc/meropenem  for now - Continue full vent support - Wean FiO2 for O2 sat > 90% - Daily WUA/SBT: desaturated an increased WOB - VAP bundle - CRRT with pull as tolerated by BP, would stop if levo goes more than 20 - Rebolus amio, increase gtt to 60 - Effluent to be increased to try to get bicarb up a bit, check VBG to see if dead space is more the issue with vent wean vs. Hypoxemia which  was the problem before - Overall while we may be able to achieve euvolemia; I am worried that his prolonged vent and sedation will make any meaningful functional recovery essentially impossible; family understands this concern and is taking it day by day, palliative is also following to help manage expectations  45 min cc time  Toribio JAYSON Sharps, MD [facility] 04/30/2024 8:24 AM  Please see [email or web address] for pager details.  From 7A-7P if no response, please call [phone] After hours, please call ELink [phone] "

## 2024-04-30 NOTE — Progress Notes (Signed)
 Patient ID: Tom Norton, male   DOB: August 05, 1929, 89 y.o.   MRN: [id number] Follow up - Trauma Critical Care   Patient Details:    Tom Norton is an 89 y.o. male.  Lines/tubes : Airway 7.5 mm (Active)  Secured at (cm) 26 cm 04/30/24 0316  Measured From Lips 04/30/24 0316  Secured Location Left 04/30/24 0316  Secured By [employer] 04/30/24 0316  Bite Block Yes 04/29/24 1636  Tube Holder Repositioned Yes 04/30/24 0316  Prone position No 04/29/24 1636  Cuff Pressure (cm H2O) Clear OR 27-39 CmH2O 04/29/24 1929  Site Condition Dry 04/29/24 1636     PICC Triple Lumen 04/17/24 Right Brachial 40 cm 0 cm (Active)  Indication for Insertion or Continuance of Line Vasoactive infusions;Prolonged intravenous therapies 04/29/24 2200  Exposed Catheter (cm) 0 cm 04/17/24 2326  Site Assessment Clean, Dry, Intact 04/29/24 2200  Lumen #1 Status Infusing 04/29/24 2200  Lumen #2 Status Infusing 04/29/24 2200  Lumen #3 Status In-line blood sampling system in place 04/29/24 2200  Dressing Type Transparent 04/29/24 2200  Dressing Status Antimicrobial disc/dressing in place;Clean, Dry, Intact 04/29/24 2200  Line Care Connections checked and tightened 04/29/24 2200  Line Adjustment (NICU/IV Team Only) No 04/21/24 0815  Dressing Intervention Dressing changed 04/25/24 0600  Dressing Change Due 05/02/24 04/29/24 2200     Arterial Line 04/23/24 Left Radial (Active)  Site Assessment Clean, Dry, Intact 04/29/24 2200  Line Status Pulsatile blood flow 04/29/24 2200  Art Line Waveform Appropriate;Square wave test performed 04/29/24 2200  Art Line Interventions Zeroed and calibrated;Connections checked and tightened 04/29/24 2200  Color/Movement/Sensation Capillary refill less than 3 sec 04/29/24 2200  Dressing Type Transparent 04/29/24 2200  Dressing Status Clean, Dry, Intact 04/28/24 0758  Dressing Change Due 04/30/24 04/29/24 2200     Negative Pressure Wound Therapy Knee Anterior;Left  (Active)     Urethral Catheter [name], RN Latex 16 Fr. (Active)  Indication for Insertion or Continuance of Catheter Therapy based on hourly urine output monitoring and documentation for critical condition (NOT STRICT I&O) 04/29/24 2000  Site Assessment Clean, Dry, Intact 04/29/24 2000  Catheter Maintenance Bag below level of bladder;Catheter secured;Drainage bag/tubing not touching floor;Insertion date on drainage bag;No dependent loops;Bag emptied prior to transport 04/29/24 2000  Collection Container Standard drainage bag 04/29/24 2000  Securement Method Adhesive securement device 04/29/24 2000  Urinary Catheter Interventions (if applicable) Unclamped 04/29/24 2000  Output (mL) 7 mL 04/30/24 0700    Microbiology/Sepsis markers: Results for orders placed or performed during the hospital encounter of 04/17/24  MRSA Next Gen by PCR, Nasal     Status: None   Collection Time: 04/17/24  8:06 PM   Specimen: Nasal Mucosa; Nasal Swab  Result Value Ref Range Status   MRSA by PCR Next Gen NOT DETECTED NOT DETECTED Final    Comment: (NOTE) The GeneXpert MRSA Assay (FDA approved for NASAL specimens only), is one component of a comprehensive MRSA colonization surveillance program. It is not intended to diagnose MRSA infection nor to guide or monitor treatment for MRSA infections. Test performance is not FDA approved in patients less than 35 years old. Performed at [facility], [address], [place], [place] [address]   Culture, Respiratory w Gram Stain     Status: None   Collection Time: 04/22/24  1:08 PM   Specimen: Tracheal Aspirate; Respiratory  Result Value Ref Range Status   Specimen Description TRACHEAL ASPIRATE  Final   Special Requests NONE  Final   Gram Stain   Final    FEW WBC PRESENT,BOTH PMN AND MONONUCLEAR MODERATE GRAM NEGATIVE RODS FEW GRAM POSITIVE COCCI RARE GRAM POSITIVE RODS Performed at [facility], [address] [place], [place]  [address]    Culture MODERATE KLEBSIELLA AEROGENES  Final   Report Status 04/24/2024 FINAL  Final   Organism ID, Bacteria KLEBSIELLA AEROGENES  Final      Susceptibility   Klebsiella aerogenes - MIC*    CEFEPIME  <=0.12 SENSITIVE Sensitive     CEFTRIAXONE 32 RESISTANT Resistant     CIPROFLOXACIN <=0.06 SENSITIVE Sensitive     GENTAMICIN <=1 SENSITIVE Sensitive     MEROPENEM  <=0.25 SENSITIVE Sensitive     TRIMETH/SULFA <=20 SENSITIVE Sensitive     PIP/TAZO Value in next row Intermediate      64 INTERMEDIATEThis is a modified FDA-approved test that has been validated and its performance characteristics determined by the reporting laboratory.  This laboratory is certified under the Clinical Laboratory Improvement Amendments CLIA as qualified to perform high complexity clinical laboratory testing.    * MODERATE KLEBSIELLA AEROGENES  Culture, Respiratory w Gram Stain     Status: None (Preliminary result)   Collection Time: 04/28/24  9:02 AM   Specimen: Tracheal Aspirate; Respiratory  Result Value Ref Range Status   Specimen Description TRACHEAL ASPIRATE  Final   Special Requests NONE  Final   Gram Stain   Final    RARE WBC PRESENT, PREDOMINANTLY PMN FEW GRAM VARIABLE ROD RARE GRAM POSITIVE COCCI IN PAIRS    Culture   Final    CULTURE REINCUBATED FOR BETTER GROWTH Performed at [facility], [address], [place], [place] [address]    Report Status PENDING  Incomplete  Respiratory (~20 pathogens) panel by PCR     Status: None   Collection Time: 04/28/24  9:52 AM   Specimen: Nasopharyngeal Swab; Respiratory  Result Value Ref Range Status   Adenovirus NOT DETECTED NOT DETECTED Final   Coronavirus 229E NOT DETECTED NOT DETECTED Final    Comment: (NOTE) The Coronavirus on the Respiratory Panel, DOES NOT test for the novel  Coronavirus (2019 nCoV)    Coronavirus HKU1 NOT DETECTED NOT DETECTED Final   Coronavirus NL63 NOT DETECTED NOT DETECTED Final   Coronavirus OC43 NOT DETECTED  NOT DETECTED Final   Metapneumovirus NOT DETECTED NOT DETECTED Final   Rhinovirus / Enterovirus NOT DETECTED NOT DETECTED Final   Influenza A NOT DETECTED NOT DETECTED Final   Influenza B NOT DETECTED NOT DETECTED Final   Parainfluenza Virus 1 NOT DETECTED NOT DETECTED Final   Parainfluenza Virus 2 NOT DETECTED NOT DETECTED Final   Parainfluenza Virus 3 NOT DETECTED NOT DETECTED Final   Parainfluenza Virus 4 NOT DETECTED NOT DETECTED Final   Respiratory Syncytial Virus NOT DETECTED NOT DETECTED Final   Bordetella pertussis NOT DETECTED NOT DETECTED Final   Bordetella Parapertussis NOT DETECTED NOT DETECTED Final   Chlamydophila pneumoniae NOT DETECTED NOT DETECTED Final   Mycoplasma pneumoniae NOT DETECTED NOT DETECTED Final    Comment: Performed at [facility], [address], [place], [place] [address]    Anti-infectives:  Anti-infectives (From admission, onward)    Start     Dose/Rate Route Frequency Ordered Stop   04/29/24 1000  vancomycin  (VANCOREADY) IVPB 1250 mg/250 mL        1,250 mg 166.7 mL/hr over 90 Minutes Intravenous Every 24 hours 04/28/24 1053     04/28/24 1700  meropenem  (MERREM ) 1 g in sodium chloride  0.9 % 100 mL IVPB        1 g 200 mL/hr over 30 Minutes Intravenous Every 8 hours 04/28/24 1052     04/28/24 1000  meropenem  (MERREM ) 1 g in sodium chloride  0.9 % 100 mL IVPB  Status:  Discontinued        1 g 200 mL/hr over 30 Minutes Intravenous Every 12 hours 04/28/24 0827 04/28/24 1052   04/28/24 0915  vancomycin  (VANCOREADY) IVPB 2000 mg/400 mL        2,000 mg 200 mL/hr over 120 Minutes Intravenous  Once 04/28/24 0827 04/28/24 1149   04/22/24 1115  ceFEPIme  (MAXIPIME ) 2 g in sodium chloride  0.9 % 100 mL IVPB  Status:  Discontinued        2 g 200 mL/hr over 30 Minutes Intravenous Every 12 hours 04/22/24 1024 04/28/24 0827   04/20/24 2045  ceFAZolin  (ANCEF ) IVPB 2g/100 mL premix       Note to Pharmacy: Anesthesia to give preop   2 g 200 mL/hr over 30  Minutes Intravenous  Once 04/20/24 1948 04/20/24 2053        Consults: Treatment Team:  Darnella Dorn SAUNDERS, MD Gearline Norris, MD    Studies:    Events:  Subjective:    Overnight Issues: I&O net -4L, down to 40% and PEEP 5  Objective:  Vital signs for last 24 hours: Temp:  [97.4 F (36.3 C)-98.9 F (37.2 C)] 97.6 F (36.4 C) (01/07 0700) Pulse Rate:  [100-123] 105 (01/07 0700) Resp:  [22-31] 27 (01/07 0700) BP: (76-124)/(52-76) 76/52 (01/07 0700) SpO2:  [90 %-100 %] 99 % (01/07 0700) Arterial Line BP: (65-142)/(36-70) 77/58 (01/07 0700) FiO2 (%):  [40 %-50 %] 50 % (01/07 0316) Weight:  [112.6 kg] 112.6 kg (01/07 0500)  Hemodynamic parameters for last 24 hours: CVP:  [19 mmHg] 19 mmHg  Intake/Output from previous day: 01/06 0701 - 01/07 0700 In: 3877.7 [I.V.:1856.8; NG/GT:1570; IV Piggyback:450.9] Out: 2040 [Urine:265]  Intake/Output this shift: No intake/output data recorded.  Vent settings for last 24 hours: Vent Mode: PRVC FiO2 (%):  [40 %-50 %] 50 % Set Rate:  [20 bmp] 20 bmp Vt Set:  [560 mL] 560 mL PEEP:  [8 cmH20] 8 cmH20 Plateau Pressure:  [20 cmH20] 20 cmH20  Physical Exam:  General: sedated Neuro: pupils 2, above Resp: few rales CVS: IRR GI: soft, NT Extremities: less edema  Results for orders placed or performed during the hospital encounter of 04/17/24 (from the past 24 hours)  Glucose, capillary     Status: Abnormal   Collection Time: 04/29/24 11:47 AM  Result Value Ref Range   Glucose-Capillary 128 (H) 70 - 99 mg/dL  Glucose, capillary     Status: Abnormal   Collection Time: 04/29/24  3:44 PM  Result Value Ref Range   Glucose-Capillary 152 (H) 70 - 99 mg/dL  Renal function panel (daily at 1600)     Status: Abnormal   Collection Time: 04/29/24  4:00 PM  Result Value Ref Range   Sodium 138 135 - 145 mmol/L   Potassium 4.7 3.5 - 5.1 mmol/L   Chloride 103 98 - 111 mmol/L   CO2 24 22 - 32 mmol/L   Glucose, Bld 152 (H) 70 - 99  mg/dL   BUN 73 (H) 8 - 23 mg/dL   Creatinine, Ser 8.19 (H) 0.61 - 1.24 mg/dL   Calcium  8.5 (L) 8.9 - 10.3 mg/dL   Phosphorus 2.8 2.5 - 4.6  mg/dL   Albumin  3.2 (L) 3.5 - 5.0 g/dL   GFR, Estimated 35 (L) >60 mL/min   Anion gap 12 5 - 15  APTT     Status: Abnormal   Collection Time: 04/29/24  4:57 PM  Result Value Ref Range   aPTT 101 (H) 24 - 36 seconds  Heparin  level (unfractionated)     Status: None   Collection Time: 04/29/24  4:58 PM  Result Value Ref Range   Heparin  Unfractionated 0.63 0.30 - 0.70 IU/mL  Glucose, capillary     Status: Abnormal   Collection Time: 04/29/24  7:19 PM  Result Value Ref Range   Glucose-Capillary 155 (H) 70 - 99 mg/dL  Glucose, capillary     Status: Abnormal   Collection Time: 04/29/24 11:30 PM  Result Value Ref Range   Glucose-Capillary 138 (H) 70 - 99 mg/dL  Glucose, capillary     Status: Abnormal   Collection Time: 04/30/24  3:23 AM  Result Value Ref Range   Glucose-Capillary 143 (H) 70 - 99 mg/dL  Magnesium      Status: None   Collection Time: 04/30/24  4:38 AM  Result Value Ref Range   Magnesium  2.2 1.7 - 2.4 mg/dL  CBC     Status: Abnormal   Collection Time: 04/30/24  4:38 AM  Result Value Ref Range   WBC 17.1 (H) 4.0 - 10.5 K/uL   RBC 2.55 (L) 4.22 - 5.81 MIL/uL   Hemoglobin 7.9 (L) 13.0 - 17.0 g/dL   HCT 74.0 (L) 60.9 - 47.9 %   MCV 101.6 (H) 80.0 - 100.0 fL   MCH 31.0 26.0 - 34.0 pg   MCHC 30.5 30.0 - 36.0 g/dL   RDW 79.7 (H) 88.4 - 84.4 %   Platelets 247 150 - 400 K/uL   nRBC 4.8 (H) 0.0 - 0.2 %  Renal function panel (daily at 0500)     Status: Abnormal   Collection Time: 04/30/24  4:38 AM  Result Value Ref Range   Sodium 137 135 - 145 mmol/L   Potassium 3.8 3.5 - 5.1 mmol/L   Chloride 105 98 - 111 mmol/L   CO2 21 (L) 22 - 32 mmol/L   Glucose, Bld 187 (H) 70 - 99 mg/dL   BUN 57 (H) 8 - 23 mg/dL   Creatinine, Ser 8.61 (H) 0.61 - 1.24 mg/dL   Calcium  7.5 (L) 8.9 - 10.3 mg/dL   Phosphorus 2.1 (L) 2.5 - 4.6 mg/dL   Albumin   2.8 (L) 3.5 - 5.0 g/dL   GFR, Estimated 48 (L) >60 mL/min   Anion gap 11 5 - 15  Heparin  level (unfractionated)     Status: None   Collection Time: 04/30/24  4:38 AM  Result Value Ref Range   Heparin  Unfractionated 0.53 0.30 - 0.70 IU/mL  Glucose, capillary     Status: Abnormal   Collection Time: 04/30/24  7:29 AM  Result Value Ref Range   Glucose-Capillary 150 (H) 70 - 99 mg/dL    Assessment & Plan: Present on Admission:  Critical polytrauma    LOS: 13 days   Additional comments:I reviewed the patient's new clinical lab test results. / Injuries: - Right femur fracture - SDH/SAH - Nondepressed parietoocipital bone fracture - Trace left basilar pneumothorax - Bilateral rib fractures - Thoracic TP fractures - Possible T9-10 discoligamentous injury - Left psoas hemorrhage without extravasation  Plan: - Appreciate neurosurgery recommendations, Dr. Darnella  - MRI thoracic spine done 1/2 - NS plan TLSO once sitting  or standing  - Repeat CTH 12/28 with stable ICH, but new occipital stroke, Dr. Merrianne consulted. MRI brain also done 1/2 - follow neuro exam - wanted to get F/U CT head prior to anticoag but he is not stable for that. See below  - Appreciate orthopaedics recommendations, Dr. Jerri, Dr. Kendal - ORIF right femur 12/29 (Dr. Jerri)  - Acute blood loss anemia   - Shock - hemorrhagic initially and now cardiogenic (CHF, afib s/p cardioversion and now amio), appreciate CCM management including pressor needs with CRRT  - acute hypoxic ventilator dependent respiratory failure - 40% and PEEP 5, appreciate CCM management, weaned on 5/5 for about while we were evaluating him then desat  - ID - was on cefepime  for klebsiella PNA, re-CX 1/6 P and changed to vanc/Merrem . Resp panel neg.   - FEN - strict NPO, cortrak  - DVT - heparin  drip  - AKI and volume overload - CRRT, appreciate Dr. Tod help  - Dispo - ICU, CRRT, vent weaning as able Dr. Claudene aand I met with his  wife and son at the bedside and outlined the plan of care. Palliative Care met with them again yesterday. Critical Care Total Time*: 33 Minutes  Dann Hummer, MD, MPH, FACS Trauma & General Surgery Use [email or web address] to contact on call provider  04/30/2024  *Care during the described time interval was provided by me. I have reviewed this patient's available data, including medical history, events of note, physical examination and test results as part of my evaluation.

## 2024-04-30 NOTE — Progress Notes (Signed)
 Pharmacy Electrolyte Replacement  Recent Labs:  Recent Labs    04/30/24 0438  K 3.8  MG 2.2  PHOS 2.1*  CREATININE 1.38*    Low Critical Values (K </= 2.5, Phos </= 1, Mg </= 1) Present: None  MD Contacted: n/a  Plan: IV 15 mmol Na Phos x1 per CRRT protocol   Rankin Sams, PharmD, BCCCP Clinical Pharmacist

## 2024-05-01 ENCOUNTER — Inpatient Hospital Stay (HOSPITAL_COMMUNITY)

## 2024-05-01 DIAGNOSIS — R579 Shock, unspecified: Secondary | ICD-10-CM | POA: Diagnosis not present

## 2024-05-01 DIAGNOSIS — J150 Pneumonia due to Klebsiella pneumoniae: Secondary | ICD-10-CM | POA: Diagnosis not present

## 2024-05-01 DIAGNOSIS — E039 Hypothyroidism, unspecified: Secondary | ICD-10-CM | POA: Diagnosis not present

## 2024-05-01 DIAGNOSIS — J9601 Acute respiratory failure with hypoxia: Secondary | ICD-10-CM | POA: Diagnosis not present

## 2024-05-01 DIAGNOSIS — D649 Anemia, unspecified: Secondary | ICD-10-CM | POA: Diagnosis not present

## 2024-05-01 DIAGNOSIS — T07XXXA Unspecified multiple injuries, initial encounter: Secondary | ICD-10-CM | POA: Diagnosis not present

## 2024-05-01 DIAGNOSIS — I4891 Unspecified atrial fibrillation: Secondary | ICD-10-CM | POA: Diagnosis not present

## 2024-05-01 DIAGNOSIS — N179 Acute kidney failure, unspecified: Secondary | ICD-10-CM | POA: Diagnosis not present

## 2024-05-01 LAB — RENAL FUNCTION PANEL
Albumin: 3.2 g/dL — ABNORMAL LOW (ref 3.5–5.0)
Albumin: 3.5 g/dL (ref 3.5–5.0)
Anion gap: 15 (ref 5–15)
Anion gap: 13 (ref 5–15)
BUN: 47 mg/dL — ABNORMAL HIGH (ref 8–23)
BUN: 54 mg/dL — ABNORMAL HIGH (ref 8–23)
CO2: 21 mmol/L — ABNORMAL LOW (ref 22–32)
CO2: 24 mmol/L (ref 22–32)
Calcium: 7.9 mg/dL — ABNORMAL LOW (ref 8.9–10.3)
Calcium: 9.3 mg/dL (ref 8.9–10.3)
Chloride: 96 mmol/L — ABNORMAL LOW (ref 98–111)
Chloride: 100 mmol/L (ref 98–111)
Creatinine, Ser: 1.26 mg/dL — ABNORMAL HIGH (ref 0.61–1.24)
Creatinine, Ser: 1.55 mg/dL — ABNORMAL HIGH (ref 0.61–1.24)
GFR, Estimated: 53 mL/min — ABNORMAL LOW
GFR, Estimated: 41 mL/min — ABNORMAL LOW
Glucose, Bld: 390 mg/dL — ABNORMAL HIGH (ref 70–99)
Glucose, Bld: 179 mg/dL — ABNORMAL HIGH (ref 70–99)
Phosphorus: 2.7 mg/dL (ref 2.5–4.6)
Phosphorus: 3.8 mg/dL (ref 2.5–4.6)
Potassium: 4.2 mmol/L (ref 3.5–5.1)
Potassium: 4.9 mmol/L (ref 3.5–5.1)
Sodium: 132 mmol/L — ABNORMAL LOW (ref 135–145)
Sodium: 137 mmol/L (ref 135–145)

## 2024-05-01 LAB — CBC
HCT: 28.5 % — ABNORMAL LOW (ref 39.0–52.0)
Hemoglobin: 8.7 g/dL — ABNORMAL LOW (ref 13.0–17.0)
MCH: 32.2 pg (ref 26.0–34.0)
MCHC: 30.5 g/dL (ref 30.0–36.0)
MCV: 105.6 fL — ABNORMAL HIGH (ref 80.0–100.0)
Platelets: 276 K/uL (ref 150–400)
RBC: 2.7 MIL/uL — ABNORMAL LOW (ref 4.22–5.81)
RDW: 20.6 % — ABNORMAL HIGH (ref 11.5–15.5)
WBC: 18.4 K/uL — ABNORMAL HIGH (ref 4.0–10.5)
nRBC: 12.8 % — ABNORMAL HIGH (ref 0.0–0.2)

## 2024-05-01 LAB — HEPARIN LEVEL (UNFRACTIONATED): Heparin Unfractionated: 0.3 [IU]/mL (ref 0.30–0.70)

## 2024-05-01 LAB — BASIC METABOLIC PANEL WITH GFR
Anion gap: 14 (ref 5–15)
BUN: 48 mg/dL — ABNORMAL HIGH (ref 8–23)
CO2: 22 mmol/L (ref 22–32)
Calcium: 8.2 mg/dL — ABNORMAL LOW (ref 8.9–10.3)
Chloride: 97 mmol/L — ABNORMAL LOW (ref 98–111)
Creatinine, Ser: 1.28 mg/dL — ABNORMAL HIGH (ref 0.61–1.24)
GFR, Estimated: 52 mL/min — ABNORMAL LOW
Glucose, Bld: 349 mg/dL — ABNORMAL HIGH (ref 70–99)
Potassium: 4.3 mmol/L (ref 3.5–5.1)
Sodium: 134 mmol/L — ABNORMAL LOW (ref 135–145)

## 2024-05-01 LAB — CULTURE, RESPIRATORY W GRAM STAIN

## 2024-05-01 LAB — PHOSPHORUS: Phosphorus: 2.9 mg/dL (ref 2.5–4.6)

## 2024-05-01 LAB — GLUCOSE, CAPILLARY
Glucose-Capillary: 175 mg/dL — ABNORMAL HIGH (ref 70–99)
Glucose-Capillary: 162 mg/dL — ABNORMAL HIGH (ref 70–99)
Glucose-Capillary: 184 mg/dL — ABNORMAL HIGH (ref 70–99)
Glucose-Capillary: 181 mg/dL — ABNORMAL HIGH (ref 70–99)
Glucose-Capillary: 197 mg/dL — ABNORMAL HIGH (ref 70–99)
Glucose-Capillary: 175 mg/dL — ABNORMAL HIGH (ref 70–99)

## 2024-05-01 LAB — MAGNESIUM: Magnesium: 2.4 mg/dL (ref 1.7–2.4)

## 2024-05-01 MED ORDER — K PHOS MONO-SOD PHOS DI & MONO 155-852-130 MG PO TABS
500.0000 mg | ORAL_TABLET | ORAL | Status: AC
  Administered 2024-05-01 – 2024-05-02 (×4): 500 mg
  Filled 2024-05-01 (×4): qty 2

## 2024-05-01 NOTE — Progress Notes (Signed)
 Patient ID: Tom Norton, male   DOB: April 26, 1929, 89 y.o.   MRN: [id number] I updated his son at the bedside.  Dann Hummer, MD, MPH, FACS Please use [email or web address] to contact on call provider

## 2024-05-01 NOTE — Progress Notes (Signed)
 " Daily Progress Note   Date: 05/01/2024   Patient Name: Tom Norton  DOB: 1930/03/10  MRN: [id number]  Age / Sex: 89 y.o., male  Attending Physician: Rolla Seltzer, MD Primary Care Physician: Charlott Dorn LABOR, MD Admit Date: 04/17/2024 Length of Stay: 14 days  Reason for Follow-up: Establishing goals of care  Past Medical History:  Diagnosis Date   BPH (benign prostatic hyperplasia)    Cardiomegaly    Dilated aortic root    a. 09/2013: 4.2cm by echo.   Dysrhythmia    Enlarged prostate without lower urinary tract symptoms (luts)    GERD (gastroesophageal reflux disease)    rare   History of hiatal hernia    Hyperlipidemia    Hypertension    Hypothyroidism    Insomnia    Junctional bradycardia    OA (osteoarthritis)    Obesity    Paroxysmal A-fib (HCC)    a. 09/23/13 a-fib with RVR and hypotensive, requiring emergent cardioversion in ED   Peripheral neuropathy 07/17/2017   Vitamin D  deficiency     Assessment & Plan:   HPI/Patient Profile:   89 y.o. male  with past medical history of HLD, HTN, osteoarthritis, paroxysmal atrial fibrillation on eliquis  admitted on 04/17/2024  with SDH, SAH, and right femur fracture 2/2 fall from balcony. CT head 04/17/2024 w/o contrast demonstrated left posterior SDH up to 9 mm thickness, multifocal bilateral SAH, linear skull fracture of left parietooccipital bone, right posterior scalp hematoma. Repeat CT head on 04/18/2024 demonstrate increase in multifocal hemorrhage, now bilateral SDH, increased SAH volume, minimal midline shift has increased. Per neurosurgery note by Darnella MD on 04/17/2024 patient not a candidate for hematoma evacuation surgery if his hemorrhages were to expand. Patient remains intubated since 04/21/2024 s/p ORIF for femur. CRRT initiated on 04/29/2023 for AKI. Patient on multiple vasopressors.   Palliative medicine consulted for goals of care conversation.  SUMMARY OF RECOMMENDATIONS Full code, full scope, family wants to  continue to take it day by day for improvements Plan for further goals of care conversation after CRRT discontinued, plan to discuss AD with family again Palliative medicine will continue to follow  Symptom Management:  Per primary team  Code Status: Full Code  Prognosis: Unable to determine  Discharge Planning: To Be Determined  Subjective:   Subjective: Chart Reviewed. Updates received. Patient Assessed. Created space and opportunity for patient  and family to explore thoughts and feelings regarding current medical situation.  Today's Discussion:  Visited patient bedside with son present. Son shared that current plan is to continue CRRT. Patient returned from CT scan of head to assess for stability after starting heparin  for DVT prophylaxis. Results not back when I was visiting. Son inquired about ventilator wean and plan for SBT. Shared with son that certain units have limits on how low FiO2 can go. Discussed ventilator weans and son shared that patient went about 7 hours on PSV and ICU MD planned to place patient back on full support overnight or when patient tires out. Educated son that it is not unusual for patients to be placed on PSV during the day and placed back on full support overnight to rest the patient. Son expressed understanding.   Review of Systems  Unable to perform ROS   Objective:   Primary Diagnoses: Present on Admission:  Critical polytrauma   Vital Signs:  BP (!) 87/58   Pulse (!) 119   Temp 98.1 F (36.7 C) (Axillary)   Resp (!) 29  Ht 5' 11 (1.803 m)   Wt 104.7 kg   SpO2 99%   BMI 32.19 kg/m   Physical Exam Constitutional:      Appearance: He is ill-appearing.     Comments: Sedated  HENT:     Head: Normocephalic.     Nose: Nose normal.     Mouth/Throat:     Mouth: Mucous membranes are dry.  Cardiovascular:     Rate and Rhythm: Tachycardia present.  Pulmonary:     Comments: On full ventilator support Neurological:     Comments:  Sedated     Palliative Assessment/Data: 30% (tube feeds)   Existing Vynca/ACP Documentation: AD and HCPOA in EMR  Thank you for allowing us  to participate in the care of Tom Norton PMT will continue to support holistically.  I personally spent a total of 35 minutes in the care of the patient today including preparing to see the patient, performing a medically appropriate exam/evaluation, counseling and educating, and documenting clinical information in the EHR.   [name] [name]  Palliative Medicine Team  Team Phone # [phone] (Nights/Weekends) 05/01/2024 7:49 PM\ "

## 2024-05-01 NOTE — Progress Notes (Signed)
 Right knee and left thumb noted to be more dark purple than this am, Dr. Sebastian and Dr. Claudene notified

## 2024-05-01 NOTE — Progress Notes (Signed)
 Assessment 89 y/o M w/ hx Afib on Eliquis  who fell off a roof and sustained multiple injuries including multiple scattered intracranial hemorrhages (worse on repeat imaging), nondisplaced L parietal skull fx, scattered bilateral infarcts c/w cardioembolic etiology, T9-10 endplate fx, T12 TP/left pars fx, T4 endplate fx, femur fracture, rib fractures, prolonged ICU stay  LOS: 14 days    Plan: SBP<160 Ok for therapeutic anticoagulation CT head reviewed - appears unchanged to me. Has bilateral convexity hygromas vs chronic SDH's plus occipital contusion which have been present on previous scans Can have head of bed as tolerated in bed. If ultimately is able to have more activity in the future, will require a TLSO brace when sitting or standing. Will hold off on ordering for now given guarded prognosis.  Please call our service if he gets to the point of activity as we can order a TLSO brace and arrange for outpatient follow-up of his SDH Neurosurgery team to sign off. Thank you for allowing us  to participate in the care of this patient. Please do not hesitate to call us  with questions or concerns    Subjective: Still ongoing palliative talks  Objective: Vital signs in last 24 hours: Temp:  [98.2 F (36.8 C)-99.4 F (37.4 C)] 98.4 F (36.9 C) (01/08 0800) Pulse Rate:  [103-129] 120 (01/08 0915) Resp:  [20-35] 24 (01/08 0915) BP: (70-140)/(50-92) 123/79 (01/08 0915) SpO2:  [77 %-100 %] 100 % (01/08 0915) Arterial Line BP: (71-177)/(42-101) 87/61 (01/07 1645) FiO2 (%):  [40 %] 40 % (01/08 0738) Weight:  [104.7 kg] 104.7 kg (01/08 0500)  Intake/Output from previous day: 01/07 0701 - 01/08 0700 In: 4336.7 [I.V.:1846; WH/HU:8434; IV Piggyback:815.8] Out: 9531.7 [Urine:79; Stool:470] Intake/Output this shift: Total I/O In: 230.3 [I.V.:130.3; NG/GT:100] Out: 630   Exam: Intubated, sedated, on multiple pressors PERRL  Lab Results: Recent Labs    04/30/24 0438 05/01/24 0500  WBC  17.1* 18.4*  HGB 7.9* 8.7*  HCT 25.9* 28.5*  PLT 247 276   BMET Recent Labs    04/30/24 1644 05/01/24 0500  NA 136 132*  134*  K 4.3 4.2  4.3  CL 101 96*  97*  CO2 23 21*  22  GLUCOSE 231* 390*  349*  BUN 55* 47*  48*  CREATININE 1.42* 1.26*  1.28*  CALCIUM  8.6* 7.9*  8.2*       Tom Norton Tom Norton 05/01/2024, 9:36 AM

## 2024-05-01 NOTE — Progress Notes (Signed)
 "  NAME:  Tom Norton, MRN:  [id number], DOB:  08-12-1929, LOS: 14 ADMISSION DATE:  04/17/2024, CONSULTATION DATE: 04/22/2024 REFERRING MD:  Trauma, CHIEF COMPLAINT:  Shock   History of Present Illness:  89 year old man admitted as polytrauma after fall from deck with multiple orthopedic injuries, fracture, whom we are consulted for hypoxemia and shock.  Patient was admitted Christmas Day after fall from deck per report.  Initial images reveal left-sided rib fractures, right femur fracture, possible iliac fractures, left posterior subdural hematoma no midline shift, small left posterior parafalcine subdural hematoma, traumatic subarachnoid hemorrhage, skull fracture, trace left apical pneumothorax.  Chest x-ray 12/28 demonstrated left-sided atelectasis and small pleural effusion on my review interpretation.  He went for femur repair 12/29.  He named and remains intubated after procedure.  Chest x-ray 12/29 reveals ET tube placement, similar right-sided infiltrates and atelectasis, new rounded left-sided infiltrate on my review interpretation.  He developed shock now on maximum dose norepinephrine , vasopressin , started on phenylephrine  overnight.  He remains intubated.  Minimal vent settings.  He is on Precedex  for sedation.  Weaning Precedex .  Blood pressure get a bit better.  Review of labs reveal serial low hemoglobins.  He is getting a transfusion today.  He has had transfusions earlier this admission.  Pertinent Medical History:    has a past medical history of BPH (benign prostatic hyperplasia), Cardiomegaly, Dilated aortic root, Dysrhythmia, Enlarged prostate without lower urinary tract symptoms (luts), GERD (gastroesophageal reflux disease), History of hiatal hernia, Hyperlipidemia, Hypertension, Hypothyroidism, Insomnia, Junctional bradycardia, OA (osteoarthritis), Obesity, Paroxysmal A-fib (HCC), Peripheral neuropathy (07/17/2017), and Vitamin D  deficiency.  Significant Hospital  Events: Including procedures, antibiotic start and stop dates in addition to other pertinent events   12/25 admitted after fall 12/29 OR for femur fracture repair  12/30 PCCM consult, AFIB RVR overnight 1/5 RIJ Trialysis, CRRT started  Interim History / Subjective:  Remains on CRRT Aggressively removing volume 5.2 L negative x 24 hours. Remains 17L pos.  NE 7, vaso 0.03 Heparin  and amio drips as well Prop and Dex - both off this morning for WUA. Very low doses overnight  Objective:   Blood pressure (!) 111/92, pulse (!) 115, temperature 98.8 F (37.1 C), temperature source Axillary, resp. rate (!) 26, height 5' 11 (1.803 m), weight 104.7 kg, SpO2 100%. CVP:  [8 mmHg-9 mmHg] 9 mmHg  Vent Mode: PRVC FiO2 (%):  [40 %-50 %] 40 % Set Rate:  [20 bmp] 20 bmp Vt Set:  [560 mL] 560 mL PEEP:  [8 cmH20] 8 cmH20 Plateau Pressure:  [20 cmH20-23 cmH20] 21 cmH20   Intake/Output Summary (Last 24 hours) at 05/01/2024 0728 Last data filed at 05/01/2024 0700 Gross per 24 hour  Intake 4336.73 ml  Output 9531.7 ml  Net -5194.97 ml   Filed Weights   04/29/24 0411 04/30/24 0500 05/01/24 0500  Weight: 116.8 kg 112.6 kg 104.7 kg   Physical Examination: General:  Elderly appearing male in NAD on vent Neuro:  L thumb twitch to pain, otherwise unresponsive (sedation just turned off, but was low dose) HEENT:  [place]/AT,  PERRL Cardiovascular:  RRR, no MRG, diffuse anasarca and scrotal edema.  Lungs:  Clear Abdomen:  Soft, non-distended, hypoactive Musculoskeletal:  No acute deformity Skin:  Intact, MMM    amiodarone  60 mg/hr (05/01/24 0700)   dexmedetomidine  (PRECEDEX ) IV infusion 0.4 mcg/kg/hr (05/01/24 0712)   feeding supplement (PIVOT 1.5 CAL) 50 mL/hr at 05/01/24 0700   heparin  1,250 Units/hr (05/01/24 0700)   meropenem  (MERREM )  IV Stopped (05/01/24 0138)   norepinephrine  (LEVOPHED ) Adult infusion 7 mcg/min (05/01/24 0714)   prismasol  BGK 4/2.5 500 mL/hr at 05/01/24 0107   prismasol  BGK 4/2.5  2,000 mL/hr at 05/01/24 0621   prismasol  BGK 4/2.5 400 mL/hr at 05/01/24 0534   propofol  (DIPRIVAN ) infusion 5 mcg/kg/min (05/01/24 0700)   vancomycin  Stopped (04/30/24 1244)   vasopressin  0.03 Units/min (05/01/24 0700)   Patient Lines/Drains/Airways Status     Active Line/Drains/Airways     Name Placement date Placement time Site Days   PICC Triple Lumen 04/17/24 Right Brachial 40 cm 0 cm 04/17/24  2320  -- 14   Hemodialysis Catheter Right Internal jugular Triple lumen Temporary (Non-Tunneled) 04/28/24  1000  Internal jugular  3   Negative Pressure Wound Therapy Knee Anterior;Left 02/18/21  1708  --  1168   Urethral Catheter [name], RN Latex 16 Fr. 04/18/24  0000  Latex  13   Fecal Management System 45 mL 04/30/24  1205  -- 1   Airway 7.5 mm 04/21/24  1211  -- 10   Small Bore Feeding Tube 10 Fr. Left nare Marking at nare/corner of mouth 61 cm 04/18/24  1610  Left nare  13   Wound 04/21/24 1333 Surgical Closed Surgical Incision Hip Right 04/21/24  1333  Hip  10           Assessment and Plan:    Polytrauma (TBI, rib fx, spinal fx, psoas hematoma, R femur fx post ORIF) - management per trauma service - No plans for further surgery - Repeat CT head today when changing CRRT filter  Type 1 resp failure- related to HCAP, volume overload Klebsiella HCAP on cefepime  x 7 days, broadened to vanc/meropenem  1/5 due to worsening O2 needs and CXR appearance - Full vent support - Hopeful he will be able to wean - ABX broadened 1/5 Mero/Vanc - Prelim 1/5 culture growing rare klebsiella and morganella. Follow to completion.  - Weaning gently on SIMV with pressure support. Will slowly decrease vent support.  - VAP bundle  Acute renal failure with total body fluid overload- unable to diurese so now on trial of CRRT - CRRT per Nephro - Goal 250 mL/Hr UF - tolerating well so far. Stable pressor requirements.  - Trend electrolytes - Potentially discontinue tomorrow.     Ongoing vasoplegia- related to sedation mostly - NE 7 + vaso for MAP goal 65 - midodrine   Anemia- mostly phlebotomy at this point, no recent transfusion needs - Trend H&H   Afib/RVR related to atrial stretch, improved with amio - amiodarone  infusion - needed rebolus yesterday and drip rate increased to 60. Now sinus tach 110.  - heparin  infusion per pharmacy - Tele  LE DVT- not helping things, would not be surprised if has some PE's - Heparin   Hypothyroid - TSH wnl - Synthroid  per tube   Hopefully with euvolemic and time off sedation he can improve enough to be extubated. Should have a lot more information in the next 24 to 48 hours including repeat CT head. If not improving despite the above therapies we are most likely seeing and end-of-life scenario.   42 min critical care time   Deward Eastern, AGACNP-BC [facility]  See Amion for personal pager PCCM on call pager [phone] until 7pm. Please call Elink 7p-7a. [phone]  05/01/2024 7:30 AM     "

## 2024-05-01 NOTE — Progress Notes (Signed)
 PHARMACY - ANTICOAGULATION CONSULT NOTE  Pharmacy Consult for heparin  gtt Indication: VTE treatment  Allergies[1]  Patient Measurements: Height: 5' 11 (180.3 cm) Weight: 104.7 kg (230 lb 13.2 oz) IBW/kg (Calculated) : 75.3 Heparin  dosing weight 99.5 kg  Vital Signs: Temp: 98.8 F (37.1 C) (01/08 0400) Temp Source: Axillary (01/08 0400) BP: 98/71 (01/08 0700) Pulse Rate: 116 (01/08 0700)  Labs: Recent Labs    04/29/24 0458 04/29/24 1600 04/29/24 1657 04/29/24 1658 04/30/24 0438 04/30/24 1434 04/30/24 1644 05/01/24 0500  HGB 8.3*  --   --   --  7.9*  --   --  8.7*  HCT 26.5*  --   --   --  25.9*  --   --  28.5*  PLT 207  --   --   --  247  --   --  276  APTT 43*  --  101*  --   --   --   --   --   HEPARINUNFRC  --   --   --    < > 0.53 0.41  --  0.30  CREATININE 2.03*   < >  --   --  1.38*  --  1.42* 1.26*  1.28*   < > = values in this interval not displayed.    Estimated Creatinine Clearance: 44.4 mL/min (A) (by C-G formula based on SCr of 1.28 mg/dL (H)).   Assessment: 89 yo M polytrauma including bilateral SDH and scattered SAH. Pt was found to have acute DVT in R and L legs. Notably, there is also large hematoma in groin throughout thigh. Pharmacy consulted for heparin  gtt for VTE treatment. Low goal, no bolus.  HL 0.30 - therapeutic  Goal of Therapy:  Heparin  level 0.3-0.5 units/ml Monitor platelets by anticoagulation protocol: Yes   Plan:  Small increase in heparin  gtt to 1300 units/hr to keep therapeutic  Daily HL, CBC  F/u s/sx bleeding and longterm anticoag plan  Rankin Sams, PharmD, BCCCP Clinical Pharmacist    [1]  Allergies Allergen Reactions   Bee Venom Anaphylaxis   Doxycycline  Hyclate Anaphylaxis   Yellow Jacket Venom Anaphylaxis   Atorvastatin Other (See Comments)   Doxycycline  Hyclate     Other Reaction(s): tachycardia/afib   Finasteride     Other Reaction(s): weak urinary stream   Sulfamethoxazole     Other Reaction(s):  rash/tachycardia/hypotension   Bactrim [Sulfamethoxazole-Trimethoprim] Hives and Rash   Rivaroxaban  Rash

## 2024-05-01 NOTE — Progress Notes (Signed)
 This chaplain is available for F/U spiritual care. Family is visiting with the Pt. wife at the bedside. The chaplain completed a quick check in with the Pt. wife. The chaplain understands from the Pt. wife, intercessory prayer is very important to the family.    Chaplain Leeroy Hummer [phone]

## 2024-05-01 NOTE — Progress Notes (Signed)
 [facility] Progress Note   Assessment/ Plan:   Assessment/Plan  AKI and volume overload: - making urine but not enough to make headway with volume unloading - will do CRRT- all 4K, will stop syringe hep since on a gtt now - time- limited trial - tolerating UF and FIO2 coming down - net negative 50-100 mL/ hr    2.  Critical polytrauma             - after falling off deck on christmas             - per trauma team   3.  Afib with RVR             - hopefully volume unloading will improve this             - on amio   4.  Acute hypoxic RF:             - volume mediates some             - hoepfully we can get some fluid off to give him the best chance at extubation  5.  Acute R DVT  - on systemic gtt now   5.  Dispo:             - In ICU  Subjective:    Seen and examined.  9L negative yesterday,  Will back down on the fluid removal.   Objective:   BP 108/70   Pulse (!) 117   Temp 99.9 F (37.7 C) (Axillary)   Resp (!) 27   Ht 5' 11 (1.803 m)   Wt 104.7 kg   SpO2 93%   BMI 32.19 kg/m   Intake/Output Summary (Last 24 hours) at 05/01/2024 1413 Last data filed at 05/01/2024 1400 Gross per 24 hour  Intake 3832.96 ml  Output 8969.6 ml  Net -5136.64 ml   Weight change: -7.9 kg  Physical Exam: Gen ill appearing elderly gentleman Heent ETT in place Neck R nontunneled HD cath PULM bilateral wet sounds, somewhat improved CV RRR ABD soft EXT 4 limb anasarca-- much improved, scrotal edema much improved NEURO intubated, sedated ACCESS R internal jugular nontunneled HD cath  Imaging: DG CHEST PORT 1 VIEW Result Date: 04/30/2024 EXAM: 1 VIEW XRAY OF THE CHEST 04/30/2024 05:38:47 AM COMPARISON: Portable chest 04/28/2024. CLINICAL HISTORY: Respiratory failure (HCC). FINDINGS: LINES, TUBES AND DEVICES: Right IJ catheter has tip in the distal SVC. Feeding tube is well into the stomach but the radiopaque tip is not seen. ETT tip is 3 cm from the carina. LUNGS  AND PLEURA: Perihilar vascular congestion, and interstitial edema in the lower zones of the lungs shows improvement. There are small pleural effusions. Patchy lower zonal airspace disease is also less dense today. The upper lung fields are essentially clear. No pneumothorax. HEART AND MEDIASTINUM: Stable cardiomegaly. The mediastinum is stable with aortic uncoiling and atherosclerosis. BONES AND SOFT TISSUES: Advanced thoracic spondylosis. No acute osseous abnormality. IMPRESSION: 1. Right IJ catheter tip in the distal SVC, endotracheal tube tip 3 cm above the carina, and feeding tube courses into the stomach with the radiopaque tip nonvisualized. 2. Improved perihilar vascular congestion, interstitial edema, and patchy lower zonal airspace disease. 3. Small pleural effusions. 4. Stable cardiomegaly. Electronically signed by: Francis Quam MD 04/30/2024 07:48 AM EST RP Workstation: [id number]    Labs: BMET Recent Labs  Lab 04/28/24 0524 04/28/24 2158 04/29/24 0458 04/29/24 1600 04/30/24 0438 04/30/24 1644 05/01/24 0500  NA 143  141 141 138 137 136 132*  134*  K 4.7 4.6 4.2 4.7 3.8 4.3 4.2  4.3  CL 106 105 104 103 105 101 96*  97*  CO2 24 24 25 24  21* 23 21*  22  GLUCOSE 128* 159* 166* 152* 187* 231* 390*  349*  BUN 102* 91* 82* 73* 57* 55* 47*  48*  CREATININE 2.65* 2.33* 2.03* 1.80* 1.38* 1.42* 1.26*  1.28*  CALCIUM  8.6* 8.5* 8.7* 8.5* 7.5* 8.6* 7.9*  8.2*  PHOS 4.0 3.4 2.9 2.8 2.1* 2.9 2.7  2.9   CBC Recent Labs  Lab 04/26/24 2109 04/27/24 0538 04/28/24 0344 04/29/24 0458 04/30/24 0438 05/01/24 0500  WBC 16.5*   < > 16.7* 16.2* 17.1* 18.4*  NEUTROABS 13.5*  --   --   --   --   --   HGB 8.2*   < > 8.2* 8.3* 7.9* 8.7*  HCT 24.8*   < > 26.3* 26.5* 25.9* 28.5*  MCV 95.4   < > 100.4* 100.8* 101.6* 105.6*  PLT 113*   < > 145* 207 247 276   < > = values in this interval not displayed.    Medications:     sodium chloride    Intravenous Once   acetaminophen  (TYLENOL ) oral  liquid 160 mg/5 mL  650 mg Per Tube Q6H   amiodarone   150 mg Intravenous Once   artificial tears   Both Eyes QID   Chlorhexidine  Gluconate Cloth  6 each Topical Q0600   docusate  100 mg Per Tube BID   feeding supplement (PROSource TF20)  60 mL Per Tube TID   insulin  aspart  0-15 Units Subcutaneous Q4H   insulin  glargine-yfgn  8 Units Subcutaneous Daily   levothyroxine   50 mcg Per Tube QAC breakfast   midodrine   10 mg Per Tube TID WC   mouth rinse  15 mL Mouth Rinse Q2H   oxyCODONE   5 mg Per Tube Q6H   pantoprazole  (PROTONIX ) IV  40 mg Intravenous Q24H   polyethylene glycol  17 g Per Tube Daily   sodium chloride  flush  10-40 mL Intracatheter Q12H    Almarie Bonine, MD 05/01/2024, 2:13 PM

## 2024-05-01 NOTE — Progress Notes (Signed)
Pt transported to and from CT scan on the ventilator. 

## 2024-05-01 NOTE — Progress Notes (Signed)
 Patient ID: Tom Norton, male   DOB: Apr 06, 1930, 89 y.o.   MRN: [id number] Follow up - Trauma Critical Care   Patient Details:    Tom Norton is an 89 y.o. male.  Lines/tubes : Airway 7.5 mm (Active)  Secured at (cm) 26 cm 05/01/24 0738  Measured From Lips 05/01/24 0738  Secured Location Center 05/01/24 0738  Secured By [employer] 05/01/24 0738  Bite Block No 05/01/24 0411  Tube Holder Repositioned Yes 05/01/24 0738  Prone position No 05/01/24 0411  Cuff Pressure (cm H2O) Green OR 18-26 CmH2O 05/01/24 0738  Site Condition Cool;Dry 05/01/24 0738     PICC Triple Lumen 04/17/24 Right Brachial 40 cm 0 cm (Active)  Indication for Insertion or Continuance of Line Vasoactive infusions 05/01/24 0736  Exposed Catheter (cm) 0 cm 04/17/24 2326  Site Assessment Clean, Dry, Intact 05/01/24 0736  Lumen #1 Status Infusing;No blood return 05/01/24 0736  Lumen #2 Status Infusing;No blood return 05/01/24 0736  Lumen #3 Status In-line blood sampling system in place;Blood return noted;Flushed 05/01/24 0736  Dressing Type Transparent 05/01/24 0736  Dressing Status Antimicrobial disc/dressing in place;Clean, Dry, Intact 05/01/24 0736  Line Care Connections checked and tightened 04/30/24 2000  Line Adjustment (NICU/IV Team Only) No 04/21/24 0815  Dressing Intervention Dressing changed 04/25/24 0600  Dressing Change Due 05/02/24 05/01/24 0736     Negative Pressure Wound Therapy Knee Anterior;Left (Active)     Urethral Catheter [name], RN Latex 16 Fr. (Active)  Indication for Insertion or Continuance of Catheter Therapy based on hourly urine output monitoring and documentation for critical condition (NOT STRICT I&O) 04/30/24 2000  Site Assessment Clean, Dry, Intact 04/30/24 2000  Catheter Maintenance Bag below level of bladder;Catheter secured;Drainage bag/tubing not touching floor;No dependent loops;Insertion date on drainage bag;Seal intact;Bag emptied prior to  transport 04/30/24 2000  Collection Container Standard drainage bag 04/30/24 2000  Securement Method Adhesive securement device 04/30/24 2000  Urinary Catheter Interventions (if applicable) Unclamped 04/29/24 2000  Output (mL) 0 mL 05/01/24 0700     Fecal Management System 45 mL (Active)  Does patient meet criteria for removal? No 05/01/24 0558  Daily care Skin around tube assessed;Skin barrier applied to rectal area;Assess location of position indicator line;Bag changed (every 24 hours);Flushed tube with 30mL water  (document as intake) 04/30/24 2000  Patient Indicator Assessment Green after adjusting fluid in device bulb 05/01/24 0558  Amount in bulb 30 mL 05/01/24 0558  Output (mL) 0 mL 05/01/24 0700  Intake (mL) 80 mL 05/01/24 0558    Microbiology/Sepsis markers: Results for orders placed or performed during the hospital encounter of 04/17/24  MRSA Next Gen by PCR, Nasal     Status: None   Collection Time: 04/17/24  8:06 PM   Specimen: Nasal Mucosa; Nasal Swab  Result Value Ref Range Status   MRSA by PCR Next Gen NOT DETECTED NOT DETECTED Final    Comment: (NOTE) The GeneXpert MRSA Assay (FDA approved for NASAL specimens only), is one component of a comprehensive MRSA colonization surveillance program. It is not intended to diagnose MRSA infection nor to guide or monitor treatment for MRSA infections. Test performance is not FDA approved in patients less than 44 years old. Performed at [facility], [address], [place], [place] [address]   Culture, Respiratory w Gram Stain     Status: None   Collection Time: 04/22/24  1:08 PM   Specimen: Tracheal Aspirate; Respiratory  Result Value Ref Range Status   Specimen  Description TRACHEAL ASPIRATE  Final   Special Requests NONE  Final   Gram Stain   Final    FEW WBC PRESENT,BOTH PMN AND MONONUCLEAR MODERATE GRAM NEGATIVE RODS FEW GRAM POSITIVE COCCI RARE GRAM POSITIVE RODS Performed at [facility], [address], [place], [place] [address]    Culture MODERATE KLEBSIELLA AEROGENES  Final   Report Status 04/24/2024 FINAL  Final   Organism ID, Bacteria KLEBSIELLA AEROGENES  Final      Susceptibility   Klebsiella aerogenes - MIC*    CEFEPIME  <=0.12 SENSITIVE Sensitive     CEFTRIAXONE 32 RESISTANT Resistant     CIPROFLOXACIN <=0.06 SENSITIVE Sensitive     GENTAMICIN <=1 SENSITIVE Sensitive     MEROPENEM  <=0.25 SENSITIVE Sensitive     TRIMETH/SULFA <=20 SENSITIVE Sensitive     PIP/TAZO Value in next row Intermediate      64 INTERMEDIATEThis is a modified FDA-approved test that has been validated and its performance characteristics determined by the reporting laboratory.  This laboratory is certified under the Clinical Laboratory Improvement Amendments CLIA as qualified to perform high complexity clinical laboratory testing.    * MODERATE KLEBSIELLA AEROGENES  Culture, Respiratory w Gram Stain     Status: None (Preliminary result)   Collection Time: 04/28/24  9:02 AM   Specimen: Tracheal Aspirate; Respiratory  Result Value Ref Range Status   Specimen Description TRACHEAL ASPIRATE  Final   Special Requests NONE  Final   Gram Stain   Final    RARE WBC PRESENT, PREDOMINANTLY PMN FEW GRAM VARIABLE ROD RARE GRAM POSITIVE COCCI IN PAIRS    Culture   Final    RARE KLEBSIELLA AEROGENES RARE MORGANELLA MORGANII SUSCEPTIBILITIES TO FOLLOW Performed at [facility], [address] [place], [place] [address]    Report Status PENDING  Incomplete  Respiratory (~20 pathogens) panel by PCR     Status: None   Collection Time: 04/28/24  9:52 AM   Specimen: Nasopharyngeal Swab; Respiratory  Result Value Ref Range Status   Adenovirus NOT DETECTED NOT DETECTED Final   Coronavirus 229E NOT DETECTED NOT DETECTED Final    Comment: (NOTE) The Coronavirus on the Respiratory Panel, DOES NOT test for the novel  Coronavirus (2019 nCoV)    Coronavirus HKU1 NOT DETECTED NOT DETECTED Final   Coronavirus NL63  NOT DETECTED NOT DETECTED Final   Coronavirus OC43 NOT DETECTED NOT DETECTED Final   Metapneumovirus NOT DETECTED NOT DETECTED Final   Rhinovirus / Enterovirus NOT DETECTED NOT DETECTED Final   Influenza A NOT DETECTED NOT DETECTED Final   Influenza B NOT DETECTED NOT DETECTED Final   Parainfluenza Virus 1 NOT DETECTED NOT DETECTED Final   Parainfluenza Virus 2 NOT DETECTED NOT DETECTED Final   Parainfluenza Virus 3 NOT DETECTED NOT DETECTED Final   Parainfluenza Virus 4 NOT DETECTED NOT DETECTED Final   Respiratory Syncytial Virus NOT DETECTED NOT DETECTED Final   Bordetella pertussis NOT DETECTED NOT DETECTED Final   Bordetella Parapertussis NOT DETECTED NOT DETECTED Final   Chlamydophila pneumoniae NOT DETECTED NOT DETECTED Final   Mycoplasma pneumoniae NOT DETECTED NOT DETECTED Final    Comment: Performed at [facility], [address], [place], [place] [address]    Anti-infectives:  Anti-infectives (From admission, onward)    Start     Dose/Rate Route Frequency Ordered Stop   04/29/24 1000  vancomycin  (VANCOREADY) IVPB 1250 mg/250 mL        1,250 mg 166.7 mL/hr over  90 Minutes Intravenous Every 24 hours 04/28/24 1053     04/28/24 1700  meropenem  (MERREM ) 1 g in sodium chloride  0.9 % 100 mL IVPB        1 g 200 mL/hr over 30 Minutes Intravenous Every 8 hours 04/28/24 1052     04/28/24 1000  meropenem  (MERREM ) 1 g in sodium chloride  0.9 % 100 mL IVPB  Status:  Discontinued        1 g 200 mL/hr over 30 Minutes Intravenous Every 12 hours 04/28/24 0827 04/28/24 1052   04/28/24 0915  vancomycin  (VANCOREADY) IVPB 2000 mg/400 mL        2,000 mg 200 mL/hr over 120 Minutes Intravenous  Once 04/28/24 0827 04/28/24 1149   04/22/24 1115  ceFEPIme  (MAXIPIME ) 2 g in sodium chloride  0.9 % 100 mL IVPB  Status:  Discontinued        2 g 200 mL/hr over 30 Minutes Intravenous Every 12 hours 04/22/24 1024 04/28/24 0827   04/20/24 2045  ceFAZolin  (ANCEF ) IVPB 2g/100 mL premix       Note to  Pharmacy: Anesthesia to give preop   2 g 200 mL/hr over 30 Minutes Intravenous  Once 04/20/24 1948 04/20/24 2053     Consults: Treatment Team:  Darnella Dorn SAUNDERS, MD Gearline Norris, MD    Studies:    Events:  Subjective:    Overnight Issues:  CRRT, 40% and PEEP 8 Objective:  Vital signs for last 24 hours: Temp:  [98.2 F (36.8 C)-99.4 F (37.4 C)] 98.8 F (37.1 C) (01/08 0400) Pulse Rate:  [103-129] 114 (01/08 0730) Resp:  [21-35] 26 (01/08 0738) BP: (70-140)/(50-92) 87/63 (01/08 0730) SpO2:  [77 %-100 %] 99 % (01/08 0730) Arterial Line BP: (71-177)/(42-101) 87/61 (01/07 1645) FiO2 (%):  [40 %] 40 % (01/08 0738) Weight:  [104.7 kg] 104.7 kg (01/08 0500)  Hemodynamic parameters for last 24 hours: CVP:  [9 mmHg] 9 mmHg  Intake/Output from previous day: 01/07 0701 - 01/08 0700 In: 4336.7 [I.V.:1846; WH/HU:8434; IV Piggyback:815.8] Out: 9531.7 [Urine:79; Stool:470]  Intake/Output this shift: Total I/O In: 119.7 [I.V.:69.7; NG/GT:50] Out: 320   Vent settings for last 24 hours: Vent Mode: PRVC FiO2 (%):  [40 %] 40 % Set Rate:  [20 bmp] 20 bmp Vt Set:  [560 mL] 560 mL PEEP:  [8 cmH20] 8 cmH20 Plateau Pressure:  [19 cmH20-23 cmH20] 19 cmH20  Physical Exam:  General: on vent Neuro: pupils 2mm, not responsive, sedation held Resp: CTA CVS: IRR GI: soft, NT, ND Extremities: a little less edema but still a lot  Results for orders placed or performed during the hospital encounter of 04/17/24 (from the past 24 hours)  Glucose, capillary     Status: Abnormal   Collection Time: 04/30/24 12:01 PM  Result Value Ref Range   Glucose-Capillary 165 (H) 70 - 99 mg/dL  Heparin  level (unfractionated)     Status: None   Collection Time: 04/30/24  2:34 PM  Result Value Ref Range   Heparin  Unfractionated 0.41 0.30 - 0.70 IU/mL  Glucose, capillary     Status: Abnormal   Collection Time: 04/30/24  4:03 PM  Result Value Ref Range   Glucose-Capillary 154 (H) 70 - 99 mg/dL   Renal function panel (daily at 1600)     Status: Abnormal   Collection Time: 04/30/24  4:44 PM  Result Value Ref Range   Sodium 136 135 - 145 mmol/L   Potassium 4.3 3.5 - 5.1 mmol/L   Chloride 101 98 -  111 mmol/L   CO2 23 22 - 32 mmol/L   Glucose, Bld 231 (H) 70 - 99 mg/dL   BUN 55 (H) 8 - 23 mg/dL   Creatinine, Ser 8.57 (H) 0.61 - 1.24 mg/dL   Calcium  8.6 (L) 8.9 - 10.3 mg/dL   Phosphorus 2.9 2.5 - 4.6 mg/dL   Albumin  3.4 (L) 3.5 - 5.0 g/dL   GFR, Estimated 46 (L) >60 mL/min   Anion gap 12 5 - 15  Glucose, capillary     Status: Abnormal   Collection Time: 04/30/24  7:26 PM  Result Value Ref Range   Glucose-Capillary 189 (H) 70 - 99 mg/dL  Glucose, capillary     Status: Abnormal   Collection Time: 04/30/24 11:41 PM  Result Value Ref Range   Glucose-Capillary 161 (H) 70 - 99 mg/dL  Glucose, capillary     Status: Abnormal   Collection Time: 05/01/24  3:33 AM  Result Value Ref Range   Glucose-Capillary 175 (H) 70 - 99 mg/dL  Phosphorus     Status: None   Collection Time: 05/01/24  5:00 AM  Result Value Ref Range   Phosphorus 2.9 2.5 - 4.6 mg/dL  Magnesium      Status: None   Collection Time: 05/01/24  5:00 AM  Result Value Ref Range   Magnesium  2.4 1.7 - 2.4 mg/dL  Basic metabolic panel with GFR     Status: Abnormal   Collection Time: 05/01/24  5:00 AM  Result Value Ref Range   Sodium 134 (L) 135 - 145 mmol/L   Potassium 4.3 3.5 - 5.1 mmol/L   Chloride 97 (L) 98 - 111 mmol/L   CO2 22 22 - 32 mmol/L   Glucose, Bld 349 (H) 70 - 99 mg/dL   BUN 48 (H) 8 - 23 mg/dL   Creatinine, Ser 8.71 (H) 0.61 - 1.24 mg/dL   Calcium  8.2 (L) 8.9 - 10.3 mg/dL   GFR, Estimated 52 (L) >60 mL/min   Anion gap 14 5 - 15  CBC     Status: Abnormal   Collection Time: 05/01/24  5:00 AM  Result Value Ref Range   WBC 18.4 (H) 4.0 - 10.5 K/uL   RBC 2.70 (L) 4.22 - 5.81 MIL/uL   Hemoglobin 8.7 (L) 13.0 - 17.0 g/dL   HCT 71.4 (L) 60.9 - 47.9 %   MCV 105.6 (H) 80.0 - 100.0 fL   MCH 32.2 26.0 - 34.0  pg   MCHC 30.5 30.0 - 36.0 g/dL   RDW 79.3 (H) 88.4 - 84.4 %   Platelets 276 150 - 400 K/uL   nRBC 12.8 (H) 0.0 - 0.2 %  Renal function panel (daily at 0500)     Status: Abnormal   Collection Time: 05/01/24  5:00 AM  Result Value Ref Range   Sodium 132 (L) 135 - 145 mmol/L   Potassium 4.2 3.5 - 5.1 mmol/L   Chloride 96 (L) 98 - 111 mmol/L   CO2 21 (L) 22 - 32 mmol/L   Glucose, Bld 390 (H) 70 - 99 mg/dL   BUN 47 (H) 8 - 23 mg/dL   Creatinine, Ser 8.73 (H) 0.61 - 1.24 mg/dL   Calcium  7.9 (L) 8.9 - 10.3 mg/dL   Phosphorus 2.7 2.5 - 4.6 mg/dL   Albumin  3.2 (L) 3.5 - 5.0 g/dL   GFR, Estimated 53 (L) >60 mL/min   Anion gap 15 5 - 15  Heparin  level (unfractionated)     Status: None   Collection Time: 05/01/24  5:00 AM  Result Value Ref Range   Heparin  Unfractionated 0.30 0.30 - 0.70 IU/mL  Glucose, capillary     Status: Abnormal   Collection Time: 05/01/24  8:01 AM  Result Value Ref Range   Glucose-Capillary 162 (H) 70 - 99 mg/dL    Assessment & Plan: Present on Admission:  Critical polytrauma    LOS: 14 days   Additional comments:I reviewed the patient's new clinical lab test results. / Injuries: - Right femur fracture - SDH/SAH - Nondepressed parietoocipital bone fracture - Trace left basilar pneumothorax - Bilateral rib fractures - Thoracic TP fractures - Possible T9-10 discoligamentous injury - Left psoas hemorrhage without extravasation  Plan: - Appreciate neurosurgery recommendations, Dr. Garst  - MRI thoracic spine done 1/2 - NS plan TLSO once sitting or standing  - Repeat [facility] 12/28 with stable ICH, but new occipital stroke, Dr. Merrianne consulted. MRI brain also done 1/2 - follow neuro exam - F/U CT H this PM  - Appreciate orthopaedics recommendations, Dr. Jerri, Dr. Kendal - ORIF right femur 12/29 (Dr. Jerri)  - Acute blood loss anemia   - Shock - hemorrhagic initially and now cardiogenic (CHF, afib s/p cardioversion and now amio), appreciate CCM management  including pressor needs with CRRT (levo and vaso)  - acute hypoxic ventilator dependent respiratory failure - 40% and PEEP 8, appreciate CCM management, failed weaning this AM and Dr. Claudene changed to SIMV.   - ID - was on cefepime  for klebsiella PNA, re-CX 1/6 klebsialla and morganella, cont. Merrem . Resp panel neg.   - FEN - strict NPO, cortrak  - DVT - heparin  drip  - AKI and volume overload - CRRT, appreciate Dr. Tod help  - Dispo - ICU, CRRT, vent weaning as able. Plan CT head when CRRT paused for filter change. Appreciate Palliativew Care F/U.  Dr. Claudene aand I met with his wife and son at the bedside and outlined the plan of care. Appreciate CCM management and collaboration.  Critical Care Total Time*: 33 Minutes  Dann Hummer, MD, MPH, FACS Trauma & General Surgery Use [email or web address] to contact on call provider  05/01/2024  *Care during the described time interval was provided by me. I have reviewed this patient's available data, including medical history, events of note, physical examination and test results as part of my evaluation.

## 2024-05-01 NOTE — Progress Notes (Signed)
 Pharmacy Electrolyte Replacement  Recent Labs:  Recent Labs    05/01/24 0500  K 4.2  4.3  MG 2.4  PHOS 2.7  2.9  CREATININE 1.26*  1.28*    Low Critical Values (K </= 2.5, Phos </= 1, Mg </= 1) Present: None  MD Contacted: n/a  Plan: K Phos  Neutral 2 tabs q4h x 4 doses per CRRT protocol   Rankin Sams, PharmD, BCCCP Clinical Pharmacist

## 2024-05-02 ENCOUNTER — Inpatient Hospital Stay (HOSPITAL_COMMUNITY)

## 2024-05-02 DIAGNOSIS — D649 Anemia, unspecified: Secondary | ICD-10-CM | POA: Diagnosis not present

## 2024-05-02 DIAGNOSIS — J189 Pneumonia, unspecified organism: Secondary | ICD-10-CM | POA: Diagnosis not present

## 2024-05-02 DIAGNOSIS — I4891 Unspecified atrial fibrillation: Secondary | ICD-10-CM | POA: Diagnosis not present

## 2024-05-02 DIAGNOSIS — N189 Chronic kidney disease, unspecified: Secondary | ICD-10-CM | POA: Diagnosis not present

## 2024-05-02 DIAGNOSIS — N179 Acute kidney failure, unspecified: Secondary | ICD-10-CM | POA: Diagnosis not present

## 2024-05-02 DIAGNOSIS — T07XXXA Unspecified multiple injuries, initial encounter: Secondary | ICD-10-CM | POA: Diagnosis not present

## 2024-05-02 DIAGNOSIS — R579 Shock, unspecified: Secondary | ICD-10-CM | POA: Diagnosis not present

## 2024-05-02 DIAGNOSIS — I129 Hypertensive chronic kidney disease with stage 1 through stage 4 chronic kidney disease, or unspecified chronic kidney disease: Secondary | ICD-10-CM | POA: Diagnosis not present

## 2024-05-02 DIAGNOSIS — J9601 Acute respiratory failure with hypoxia: Secondary | ICD-10-CM | POA: Diagnosis not present

## 2024-05-02 LAB — RENAL FUNCTION PANEL
Albumin: 3.4 g/dL — ABNORMAL LOW (ref 3.5–5.0)
Albumin: 3.1 g/dL — ABNORMAL LOW (ref 3.5–5.0)
Anion gap: 13 (ref 5–15)
Anion gap: 19 — ABNORMAL HIGH (ref 5–15)
BUN: 51 mg/dL — ABNORMAL HIGH (ref 8–23)
BUN: 59 mg/dL — ABNORMAL HIGH (ref 8–23)
CO2: 24 mmol/L (ref 22–32)
CO2: 20 mmol/L — ABNORMAL LOW (ref 22–32)
Calcium: 9.1 mg/dL (ref 8.9–10.3)
Calcium: 8.3 mg/dL — ABNORMAL LOW (ref 8.9–10.3)
Chloride: 100 mmol/L (ref 98–111)
Chloride: 89 mmol/L — ABNORMAL LOW (ref 98–111)
Creatinine, Ser: 1.44 mg/dL — ABNORMAL HIGH (ref 0.61–1.24)
Creatinine, Ser: 1.79 mg/dL — ABNORMAL HIGH (ref 0.61–1.24)
GFR, Estimated: 45 mL/min — ABNORMAL LOW
GFR, Estimated: 35 mL/min — ABNORMAL LOW
Glucose, Bld: 192 mg/dL — ABNORMAL HIGH (ref 70–99)
Glucose, Bld: 557 mg/dL (ref 70–99)
Phosphorus: 3.7 mg/dL (ref 2.5–4.6)
Phosphorus: 4.8 mg/dL — ABNORMAL HIGH (ref 2.5–4.6)
Potassium: 4.5 mmol/L (ref 3.5–5.1)
Potassium: 4.6 mmol/L (ref 3.5–5.1)
Sodium: 137 mmol/L (ref 135–145)
Sodium: 128 mmol/L — ABNORMAL LOW (ref 135–145)

## 2024-05-02 LAB — POCT I-STAT EG7
Acid-base deficit: 2 mmol/L (ref 0.0–2.0)
Acid-base deficit: 1 mmol/L (ref 0.0–2.0)
Bicarbonate: 24 mmol/L (ref 20.0–28.0)
Bicarbonate: 26 mmol/L (ref 20.0–28.0)
Calcium, Ion: 1.14 mmol/L — ABNORMAL LOW (ref 1.15–1.40)
Calcium, Ion: 1.19 mmol/L (ref 1.15–1.40)
HCT: 26 % — ABNORMAL LOW (ref 39.0–52.0)
HCT: 30 % — ABNORMAL LOW (ref 39.0–52.0)
Hemoglobin: 8.8 g/dL — ABNORMAL LOW (ref 13.0–17.0)
Hemoglobin: 10.2 g/dL — ABNORMAL LOW (ref 13.0–17.0)
O2 Saturation: 59 %
O2 Saturation: 61 %
Patient temperature: 97.6
Patient temperature: 98.4
Potassium: 4.2 mmol/L (ref 3.5–5.1)
Potassium: 4.4 mmol/L (ref 3.5–5.1)
Sodium: 136 mmol/L (ref 135–145)
Sodium: 135 mmol/L (ref 135–145)
TCO2: 25 mmol/L (ref 22–32)
TCO2: 28 mmol/L (ref 22–32)
pCO2, Ven: 46.4 mmHg (ref 44–60)
pCO2, Ven: 52.9 mmHg (ref 44–60)
pH, Ven: 7.319 (ref 7.25–7.43)
pH, Ven: 7.298 (ref 7.25–7.43)
pO2, Ven: 33 mmHg (ref 32–45)
pO2, Ven: 35 mmHg (ref 32–45)

## 2024-05-02 LAB — CBC
HCT: 31.5 % — ABNORMAL LOW (ref 39.0–52.0)
Hemoglobin: 9.4 g/dL — ABNORMAL LOW (ref 13.0–17.0)
MCH: 31.9 pg (ref 26.0–34.0)
MCHC: 29.8 g/dL — ABNORMAL LOW (ref 30.0–36.0)
MCV: 106.8 fL — ABNORMAL HIGH (ref 80.0–100.0)
Platelets: 297 K/uL (ref 150–400)
RBC: 2.95 MIL/uL — ABNORMAL LOW (ref 4.22–5.81)
RDW: 21.4 % — ABNORMAL HIGH (ref 11.5–15.5)
WBC: 21 K/uL — ABNORMAL HIGH (ref 4.0–10.5)
nRBC: 31.5 % — ABNORMAL HIGH (ref 0.0–0.2)

## 2024-05-02 LAB — HEPARIN LEVEL (UNFRACTIONATED)
Heparin Unfractionated: 0.2 [IU]/mL — ABNORMAL LOW (ref 0.30–0.70)
Heparin Unfractionated: 0.2 [IU]/mL — ABNORMAL LOW (ref 0.30–0.70)

## 2024-05-02 LAB — GLUCOSE, CAPILLARY
Glucose-Capillary: 188 mg/dL — ABNORMAL HIGH (ref 70–99)
Glucose-Capillary: 187 mg/dL — ABNORMAL HIGH (ref 70–99)
Glucose-Capillary: 195 mg/dL — ABNORMAL HIGH (ref 70–99)
Glucose-Capillary: 194 mg/dL — ABNORMAL HIGH (ref 70–99)
Glucose-Capillary: 198 mg/dL — ABNORMAL HIGH (ref 70–99)
Glucose-Capillary: 189 mg/dL — ABNORMAL HIGH (ref 70–99)

## 2024-05-02 LAB — MAGNESIUM: Magnesium: 2.4 mg/dL (ref 1.7–2.4)

## 2024-05-02 LAB — PHOSPHORUS: Phosphorus: 4.8 mg/dL — ABNORMAL HIGH (ref 2.5–4.6)

## 2024-05-02 MED ORDER — INSULIN ASPART 100 UNIT/ML IJ SOLN
3.0000 [IU] | INTRAMUSCULAR | Status: DC
  Administered 2024-05-02 – 2024-05-03 (×6): 3 [IU] via SUBCUTANEOUS
  Filled 2024-05-02 (×4): qty 3

## 2024-05-02 MED ORDER — SODIUM CHLORIDE 0.9 % IV SOLN
1.0000 g | Freq: Two times a day (BID) | INTRAVENOUS | Status: DC
  Administered 2024-05-02 – 2024-05-04 (×5): 1 g via INTRAVENOUS
  Filled 2024-05-02 (×5): qty 20

## 2024-05-02 MED ORDER — INSULIN GLARGINE-YFGN 100 UNIT/ML ~~LOC~~ SOLN
10.0000 [IU] | Freq: Two times a day (BID) | SUBCUTANEOUS | Status: DC
  Administered 2024-05-02 – 2024-05-04 (×5): 10 [IU] via SUBCUTANEOUS
  Filled 2024-05-02 (×7): qty 0.1

## 2024-05-02 MED ORDER — HYDROCORTISONE SOD SUC (PF) 100 MG IJ SOLR
100.0000 mg | Freq: Two times a day (BID) | INTRAMUSCULAR | Status: DC
  Administered 2024-05-02 – 2024-05-04 (×5): 100 mg via INTRAVENOUS
  Filled 2024-05-02 (×5): qty 2

## 2024-05-02 MED ORDER — LINEZOLID 600 MG/300ML IV SOLN
600.0000 mg | Freq: Two times a day (BID) | INTRAVENOUS | Status: DC
  Administered 2024-05-02 (×2): 600 mg via INTRAVENOUS
  Filled 2024-05-02 (×3): qty 300

## 2024-05-02 NOTE — Progress Notes (Signed)
  [facility] Progress Note   Assessment/ Plan:   Assessment/Plan  AKI and volume overload: - making urine but not enough to make headway with volume unloading - will do CRRT- all 4K, will stop syringe hep since on a gtt now - time- limited trial--> pause today - pressors are up.  O2 requirement has improved but mentation has not - appreciate palliative care    2.  Critical polytrauma             - after falling off deck on christmas             - per trauma team   3.  Afib with RVR             - hopefully volume unloading will improve this             - on amio   4.  Acute hypoxic RF:             - volume mediates some             - hoepfully we can get some fluid off to give him the best chance at extubation  5.  Acute R DVT  - on systemic gtt now   5.  Dispo:             - In ICU  Subjective:    Filter clotted today.  Pressors up, responsiveness not improved.  Discussed together with PCCM with pt's wife- will pause CRRT and watch for renal recovery.   Objective:   BP 120/69   Pulse (!) 112   Temp 99.3 F (37.4 C) (Axillary)   Resp (!) 26   Ht 5' 11 (1.803 m)   Wt 98.9 kg   SpO2 100%   BMI 30.41 kg/m   Intake/Output Summary (Last 24 hours) at 05/02/2024 1341 Last data filed at 05/02/2024 1300 Gross per 24 hour  Intake 3744.66 ml  Output 4367.1 ml  Net -622.44 ml   Weight change: -5.8 kg  Physical Exam: Gen ill appearing elderly gentleman Heent ETT in place Neck R nontunneled HD cath PULM bilateral wet sounds, somewhat improved CV RRR ABD soft EXT 4 limb anasarca-- much improved, scrotal edema much improved NEURO intubated, sedated ACCESS R internal jugular nontunneled HD cath  Imaging: CT HEAD WO CONTRAST ( ) Result Date: 05/01/2024 EXAM: CT HEAD WITHOUT CONTRAST 05/01/2024 02:42:00 PM TECHNIQUE: CT of the head was performed without the administration of intravenous contrast. Automated exposure control, iterative reconstruction, and/or  weight based adjustment of the mA/kV was utilized to reduce the radiation dose to as low as reasonably achievable. COMPARISON: 04/20/2024 CLINICAL HISTORY: Head trauma, moderate-severe. FINDINGS: BRAIN AND VENTRICLES: Decreased volume of left sided subdural hemorrhage. The most confluent region of hemorrhage over the left parietal lobe measures up to 6 mm in thickness, previously measuring up to 8 mm when remeasured in a similar manner. Subdural hemorrhages decreased in extent and overall decreased in attenuation. Additional areas of perifalcine subdural hemorrhage are also decreased from prior. There is decreased hemorrhage along the tentorial leaflets. Similar appearance of primarily hypoattenuating right subdural hematoma measuring up to 4 mm in thickness. Decreased scattered areas of subarachnoid hemorrhage. No evidence of acute infarct. Stable left occipital infarct. Remote lacunar infarcts in the right basal ganglia. Generalized cerebral atrophy. No hydrocephalus. No extra-axial collection. No mass effect or midline shift. Nasogastric and endotracheal tubes in place. ORBITS: No acute abnormality. SINUSES: No acute abnormality. SOFT TISSUES AND SKULL: Nondisplaced left  parietal skull fracture redemonstrated. Small left mastoid effusion appears increased from prior. No acute soft tissue abnormality. IMPRESSION: 1. Decreased subdural and subarachnoid hemorrhage as described above. No new or enlarging intracranial hemorrhage. 2. No significant midline shift. 3. Nondisplaced left parietal skull fracture redemonstrated. Electronically signed by: Donnice Mania MD MD 05/01/2024 03:41 PM EST RP Workstation: [id number]    Labs: BMET Recent Labs  Lab 04/29/24 0458 04/29/24 1600 04/30/24 0438 04/30/24 0839 04/30/24 1644 05/01/24 0500 05/01/24 0817 05/01/24 1525 05/02/24 0535  NA 141 138 137 136 136 132*  134* 135 137 137  K 4.2 4.7 3.8 4.2 4.3 4.2  4.3 4.4 4.9 4.5  CL 104 103 105  --  101 96*  97*  --   100 100  CO2 25 24 21*  --  23 21*  22  --  24 24  GLUCOSE 166* 152* 187*  --  231* 390*  349*  --  179* 192*  BUN 82* 73* 57*  --  55* 47*  48*  --  54* 51*  CREATININE 2.03* 1.80* 1.38*  --  1.42* 1.26*  1.28*  --  1.55* 1.44*  CALCIUM  8.7* 8.5* 7.5*  --  8.6* 7.9*  8.2*  --  9.3 9.1  PHOS 2.9 2.8 2.1*  --  2.9 2.7  2.9  --  3.8 3.7   CBC Recent Labs  Lab 04/26/24 2109 04/27/24 0538 04/29/24 0458 04/30/24 0438 04/30/24 0839 05/01/24 0500 05/01/24 0817 05/02/24 0535  WBC 16.5*   < > 16.2* 17.1*  --  18.4*  --  21.0*  NEUTROABS 13.5*  --   --   --   --   --   --   --   HGB 8.2*   < > 8.3* 7.9* 8.8* 8.7* 10.2* 9.4*  HCT 24.8*   < > 26.5* 25.9* 26.0* 28.5* 30.0* 31.5*  MCV 95.4   < > 100.8* 101.6*  --  105.6*  --  106.8*  PLT 113*   < > 207 247  --  276  --  297   < > = values in this interval not displayed.    Medications:     sodium chloride    Intravenous Once   acetaminophen  (TYLENOL ) oral liquid 160 mg/5 mL  650 mg Per Tube Q6H   amiodarone   150 mg Intravenous Once   artificial tears   Both Eyes QID   Chlorhexidine  Gluconate Cloth  6 each Topical Q0600   docusate  100 mg Per Tube BID   feeding supplement (PROSource TF20)  60 mL Per Tube TID   hydrocortisone  sod succinate (SOLU-CORTEF ) inj  100 mg Intravenous Q12H   insulin  aspart  0-15 Units Subcutaneous Q4H   insulin  aspart  3 Units Subcutaneous Q4H   insulin  glargine-yfgn  10 Units Subcutaneous BID   levothyroxine   50 mcg Per Tube QAC breakfast   midodrine   10 mg Per Tube TID WC   mouth rinse  15 mL Mouth Rinse Q2H   pantoprazole  (PROTONIX ) IV  40 mg Intravenous Q24H   polyethylene glycol  17 g Per Tube Daily   sodium chloride  flush  10-40 mL Intracatheter Q12H    Almarie Bonine, MD 05/02/2024, 1:41 PM

## 2024-05-02 NOTE — Progress Notes (Signed)
 Patient ID: Tom Norton, male   DOB: 1929-05-19, 89 y.o.   MRN: [id number] Follow up - Trauma Critical Care   Patient Details:    Tom Norton is an 89 y.o. male.  Lines/tubes : Airway 7.5 mm (Active)  Secured at (cm) 26 cm 05/02/24 0750  Measured From Lips 05/02/24 0750  Secured Location Center 05/02/24 0750  Secured By [employer] 05/02/24 0750  Bite Block No 05/01/24 2004  Tube Holder Repositioned Yes 05/02/24 0750  Prone position No 05/01/24 2004  Cuff Pressure (cm H2O) Green OR 18-26 CmH2O 05/02/24 0750  Site Condition Cool;Dry 05/02/24 0750     PICC Triple Lumen 04/17/24 Right Brachial 40 cm 0 cm (Active)  Indication for Insertion or Continuance of Line Vasoactive infusions 05/02/24 0727  Exposed Catheter (cm) 0 cm 04/17/24 2326  Site Assessment Clean, Dry, Intact 05/02/24 0727  Lumen #1 Status Flushed;Infusing;No blood return 05/02/24 0727  Lumen #2 Status Flushed;Infusing;No blood return 05/02/24 0727  Lumen #3 Status Flushed;Blood return noted;In-line blood sampling system in place 05/02/24 0727  Dressing Type Transparent 05/02/24 0727  Dressing Status Antimicrobial disc/dressing in place;Clean, Dry, Intact 05/02/24 0727  Line Care Connections checked and tightened 05/01/24 2000  Line Adjustment (NICU/IV Team Only) No 04/21/24 0815  Dressing Intervention Dressing changed 04/25/24 0600  Dressing Change Due 05/02/24 05/02/24 0727     Negative Pressure Wound Therapy Knee Anterior;Left (Active)     Urethral Catheter [name], RN Latex 16 Fr. (Active)  Indication for Insertion or Continuance of Catheter Therapy based on hourly urine output monitoring and documentation for critical condition (NOT STRICT I&O) 05/02/24 0723  Site Assessment Clean, Dry, Intact 05/02/24 0723  Catheter Maintenance Bag below level of bladder;Catheter secured;Drainage bag/tubing not touching floor;Insertion date on drainage bag;No dependent loops;Seal intact 05/02/24  0723  Collection Container Standard drainage bag 05/02/24 0723  Securement Method Adhesive securement device 05/02/24 9276  Urinary Catheter Interventions (if applicable) Unclamped 05/02/24 0723  Output (mL) 0 mL 05/02/24 0700     Fecal Management System 45 mL (Active)  Does patient meet criteria for removal? No 05/02/24 0723  Daily care Skin barrier applied to rectal area;Skin around tube assessed;Assess location of position indicator line;Flushed tube with 30mL water  (document as intake);Bag changed (every 24 hours) 05/02/24 0723  Patient Indicator Assessment Green 05/02/24 0723  Amount in bulb 30 mL 05/01/24 0558  Output (mL) 30 mL 05/02/24 0553  Intake (mL) 80 mL 05/01/24 0558    Microbiology/Sepsis markers: Results for orders placed or performed during the hospital encounter of 04/17/24  MRSA Next Gen by PCR, Nasal     Status: None   Collection Time: 04/17/24  8:06 PM   Specimen: Nasal Mucosa; Nasal Swab  Result Value Ref Range Status   MRSA by PCR Next Gen NOT DETECTED NOT DETECTED Final    Comment: (NOTE) The GeneXpert MRSA Assay (FDA approved for NASAL specimens only), is one component of a comprehensive MRSA colonization surveillance program. It is not intended to diagnose MRSA infection nor to guide or monitor treatment for MRSA infections. Test performance is not FDA approved in patients less than 40 years old. Performed at [facility], [address], [place], [place] [address]   Culture, Respiratory w Gram Stain     Status: None   Collection Time: 04/22/24  1:08 PM   Specimen: Tracheal Aspirate; Respiratory  Result Value Ref Range Status   Specimen Description TRACHEAL ASPIRATE  Final   Special Requests NONE  Final   Gram Stain   Final    FEW WBC PRESENT,BOTH PMN AND MONONUCLEAR MODERATE GRAM NEGATIVE RODS FEW GRAM POSITIVE COCCI RARE GRAM POSITIVE RODS Performed at [facility], [address] [place], [place] [address]    Culture MODERATE  KLEBSIELLA AEROGENES  Final   Report Status 04/24/2024 FINAL  Final   Organism ID, Bacteria KLEBSIELLA AEROGENES  Final      Susceptibility   Klebsiella aerogenes - MIC*    CEFEPIME  <=0.12 SENSITIVE Sensitive     CEFTRIAXONE 32 RESISTANT Resistant     CIPROFLOXACIN <=0.06 SENSITIVE Sensitive     GENTAMICIN <=1 SENSITIVE Sensitive     MEROPENEM  <=0.25 SENSITIVE Sensitive     TRIMETH/SULFA <=20 SENSITIVE Sensitive     PIP/TAZO Value in next row Intermediate      64 INTERMEDIATEThis is a modified FDA-approved test that has been validated and its performance characteristics determined by the reporting laboratory.  This laboratory is certified under the Clinical Laboratory Improvement Amendments CLIA as qualified to perform high complexity clinical laboratory testing.    * MODERATE KLEBSIELLA AEROGENES  Culture, Respiratory w Gram Stain     Status: None   Collection Time: 04/28/24  9:02 AM   Specimen: Tracheal Aspirate; Respiratory  Result Value Ref Range Status   Specimen Description TRACHEAL ASPIRATE  Final   Special Requests NONE  Final   Gram Stain   Final    RARE WBC PRESENT, PREDOMINANTLY PMN FEW GRAM VARIABLE ROD RARE GRAM POSITIVE COCCI IN PAIRS Performed at [facility], [address], [place], [place] [address]    Culture   Final    RARE KLEBSIELLA AEROGENES RARE MORGANELLA MORGANII    Report Status 05/01/2024 FINAL  Final   Organism ID, Bacteria KLEBSIELLA AEROGENES  Final   Organism ID, Bacteria MORGANELLA MORGANII  Final      Susceptibility   Klebsiella aerogenes - MIC*    CEFEPIME  <=0.12 SENSITIVE Sensitive     CEFTRIAXONE 32 RESISTANT Resistant     CIPROFLOXACIN <=0.06 SENSITIVE Sensitive     GENTAMICIN <=1 SENSITIVE Sensitive     MEROPENEM  <=0.25 SENSITIVE Sensitive     TRIMETH/SULFA <=20 SENSITIVE Sensitive     PIP/TAZO Value in next row Resistant      >=128 RESISTANTThis is a modified FDA-approved test that has been validated and its performance  characteristics determined by the reporting laboratory.  This laboratory is certified under the Clinical Laboratory Improvement Amendments CLIA as qualified to perform high complexity clinical laboratory testing.    * RARE KLEBSIELLA AEROGENES   Morganella morganii - MIC*    AMPICILLIN Value in next row Resistant      >=128 RESISTANTThis is a modified FDA-approved test that has been validated and its performance characteristics determined by the reporting laboratory.  This laboratory is certified under the Clinical Laboratory Improvement Amendments CLIA as qualified to perform high complexity clinical laboratory testing.    ERTAPENEM Value in next row Sensitive      >=128 RESISTANTThis is a modified FDA-approved test that has been validated and its performance characteristics determined by the reporting laboratory.  This laboratory is certified under the Clinical Laboratory Improvement Amendments CLIA as qualified to perform high complexity clinical laboratory testing.    CIPROFLOXACIN Value in next row Sensitive      >=128 RESISTANTThis is a modified FDA-approved test that has been validated and its performance characteristics determined by the reporting laboratory.  This laboratory is certified under the Clinical  Laboratory Improvement Amendments CLIA as qualified to perform high complexity clinical laboratory testing.    GENTAMICIN Value in next row Sensitive      >=128 RESISTANTThis is a modified FDA-approved test that has been validated and its performance characteristics determined by the reporting laboratory.  This laboratory is certified under the Clinical Laboratory Improvement Amendments CLIA as qualified to perform high complexity clinical laboratory testing.    MEROPENEM  Value in next row Sensitive      >=128 RESISTANTThis is a modified FDA-approved test that has been validated and its performance characteristics determined by the reporting laboratory.  This laboratory is certified under the  Clinical Laboratory Improvement Amendments CLIA as qualified to perform high complexity clinical laboratory testing.    TRIMETH/SULFA Value in next row Sensitive      >=128 RESISTANTThis is a modified FDA-approved test that has been validated and its performance characteristics determined by the reporting laboratory.  This laboratory is certified under the Clinical Laboratory Improvement Amendments CLIA as qualified to perform high complexity clinical laboratory testing.    AMPICILLIN/SULBACTAM Value in next row Resistant      >=128 RESISTANTThis is a modified FDA-approved test that has been validated and its performance characteristics determined by the reporting laboratory.  This laboratory is certified under the Clinical Laboratory Improvement Amendments CLIA as qualified to perform high complexity clinical laboratory testing.    PIP/TAZO Value in next row Sensitive      <=4 SENSITIVEThis is a modified FDA-approved test that has been validated and its performance characteristics determined by the reporting laboratory.  This laboratory is certified under the Clinical Laboratory Improvement Amendments CLIA as qualified to perform high complexity clinical laboratory testing.    * RARE MORGANELLA MORGANII  Respiratory (~20 pathogens) panel by PCR     Status: None   Collection Time: 04/28/24  9:52 AM   Specimen: Nasopharyngeal Swab; Respiratory  Result Value Ref Range Status   Adenovirus NOT DETECTED NOT DETECTED Final   Coronavirus 229E NOT DETECTED NOT DETECTED Final    Comment: (NOTE) The Coronavirus on the Respiratory Panel, DOES NOT test for the novel  Coronavirus (2019 nCoV)    Coronavirus HKU1 NOT DETECTED NOT DETECTED Final   Coronavirus NL63 NOT DETECTED NOT DETECTED Final   Coronavirus OC43 NOT DETECTED NOT DETECTED Final   Metapneumovirus NOT DETECTED NOT DETECTED Final   Rhinovirus / Enterovirus NOT DETECTED NOT DETECTED Final   Influenza A NOT DETECTED NOT DETECTED Final    Influenza B NOT DETECTED NOT DETECTED Final   Parainfluenza Virus 1 NOT DETECTED NOT DETECTED Final   Parainfluenza Virus 2 NOT DETECTED NOT DETECTED Final   Parainfluenza Virus 3 NOT DETECTED NOT DETECTED Final   Parainfluenza Virus 4 NOT DETECTED NOT DETECTED Final   Respiratory Syncytial Virus NOT DETECTED NOT DETECTED Final   Bordetella pertussis NOT DETECTED NOT DETECTED Final   Bordetella Parapertussis NOT DETECTED NOT DETECTED Final   Chlamydophila pneumoniae NOT DETECTED NOT DETECTED Final   Mycoplasma pneumoniae NOT DETECTED NOT DETECTED Final    Comment: Performed at [facility], [address] [place], [place] [address]    Anti-infectives:  Anti-infectives (From admission, onward)    Start     Dose/Rate Route Frequency Ordered Stop   04/29/24 1000  vancomycin  (VANCOREADY) IVPB 1250 mg/250 mL  Status:  Discontinued        1,250 mg 166.7 mL/hr over 90 Minutes Intravenous Every 24 hours 04/28/24 1053 05/01/24 1059   04/28/24 1700  meropenem  (  MERREM ) 1 g in sodium chloride  0.9 % 100 mL IVPB        1 g 200 mL/hr over 30 Minutes Intravenous Every 8 hours 04/28/24 1052     04/28/24 1000  meropenem  (MERREM ) 1 g in sodium chloride  0.9 % 100 mL IVPB  Status:  Discontinued        1 g 200 mL/hr over 30 Minutes Intravenous Every 12 hours 04/28/24 0827 04/28/24 1052   04/28/24 0915  vancomycin  (VANCOREADY) IVPB 2000 mg/400 mL        2,000 mg 200 mL/hr over 120 Minutes Intravenous  Once 04/28/24 0827 04/28/24 1149   04/22/24 1115  ceFEPIme  (MAXIPIME ) 2 g in sodium chloride  0.9 % 100 mL IVPB  Status:  Discontinued        2 g 200 mL/hr over 30 Minutes Intravenous Every 12 hours 04/22/24 1024 04/28/24 0827   04/20/24 2045  ceFAZolin  (ANCEF ) IVPB 2g/100 mL premix       Note to Pharmacy: Anesthesia to give preop   2 g 200 mL/hr over 30 Minutes Intravenous  Once 04/20/24 1948 04/20/24 2053     Consults: Treatment Team:  Gearline Norris, MD     Studies:    Events:  Subjective:    Overnight Issues: increased pressor requirements  Objective:  Vital signs for last 24 hours: Temp:  [98.1 F (36.7 C)-99.9 F (37.7 C)] 99.6 F (37.6 C) (01/09 0800) Pulse Rate:  [104-129] 120 (01/09 0800) Resp:  [15-31] 24 (01/09 0800) BP: (77-134)/(50-89) 91/55 (01/09 0800) SpO2:  [89 %-100 %] 97 % (01/09 0800) FiO2 (%):  [40 %] 40 % (01/09 0750) Weight:  [98.9 kg] 98.9 kg (01/09 0600)  Hemodynamic parameters for last 24 hours:    Intake/Output from previous day: 01/08 0701 - 01/09 0700 In: 3689.3 [I.V.:1479.3; NG/GT:1660; IV Piggyback:550] Out: 5845.1 [Urine:21; Stool:30]  Intake/Output this shift: Total I/O In: 116.5 [I.V.:66.5; NG/GT:50] Out: 220   Vent settings for last 24 hours: Vent Mode: PRVC FiO2 (%):  [40 %] 40 % Set Rate:  [10 bmp-18 bmp] 18 bmp Vt Set:  [560 mL] 560 mL PEEP:  [8 cmH20] 8 cmH20 Pressure Support:  [14 cmH20] 14 cmH20 Plateau Pressure:  [18 cmH20-20 cmH20] 20 cmH20  Physical Exam:  General: on vent Neuro: pupils 2mm, no response to voice nor pain HEENT/Neck: ETT Resp: clear to auscultation bilaterally CVS: IRR GI: soft, NT Extremities: less edema, L thumb with purple patch, R knee rad patch  Results for orders placed or performed during the hospital encounter of 04/17/24 (from the past 24 hours)  Glucose, capillary     Status: Abnormal   Collection Time: 05/01/24 11:36 AM  Result Value Ref Range   Glucose-Capillary 184 (H) 70 - 99 mg/dL  Glucose, capillary     Status: Abnormal   Collection Time: 05/01/24  3:13 PM  Result Value Ref Range   Glucose-Capillary 181 (H) 70 - 99 mg/dL  Renal function panel (daily at 1600)     Status: Abnormal   Collection Time: 05/01/24  3:25 PM  Result Value Ref Range   Sodium 137 135 - 145 mmol/L   Potassium 4.9 3.5 - 5.1 mmol/L   Chloride 100 98 - 111 mmol/L   CO2 24 22 - 32 mmol/L   Glucose, Bld 179 (H) 70 - 99 mg/dL   BUN 54 (H) 8 - 23 mg/dL    Creatinine, Ser 8.44 (H) 0.61 - 1.24 mg/dL   Calcium  9.3 8.9 - 10.3 mg/dL  Phosphorus 3.8 2.5 - 4.6 mg/dL   Albumin  3.5 3.5 - 5.0 g/dL   GFR, Estimated 41 (L) >60 mL/min   Anion gap 13 5 - 15  Glucose, capillary     Status: Abnormal   Collection Time: 05/01/24  7:35 PM  Result Value Ref Range   Glucose-Capillary 197 (H) 70 - 99 mg/dL  Glucose, capillary     Status: Abnormal   Collection Time: 05/01/24 11:21 PM  Result Value Ref Range   Glucose-Capillary 175 (H) 70 - 99 mg/dL  Glucose, capillary     Status: Abnormal   Collection Time: 05/02/24  3:31 AM  Result Value Ref Range   Glucose-Capillary 188 (H) 70 - 99 mg/dL  Renal function panel (daily at 0500)     Status: Abnormal   Collection Time: 05/02/24  5:35 AM  Result Value Ref Range   Sodium 137 135 - 145 mmol/L   Potassium 4.5 3.5 - 5.1 mmol/L   Chloride 100 98 - 111 mmol/L   CO2 24 22 - 32 mmol/L   Glucose, Bld 192 (H) 70 - 99 mg/dL   BUN 51 (H) 8 - 23 mg/dL   Creatinine, Ser 8.55 (H) 0.61 - 1.24 mg/dL   Calcium  9.1 8.9 - 10.3 mg/dL   Phosphorus 3.7 2.5 - 4.6 mg/dL   Albumin  3.4 (L) 3.5 - 5.0 g/dL   GFR, Estimated 45 (L) >60 mL/min   Anion gap 13 5 - 15  Heparin  level (unfractionated)     Status: Abnormal   Collection Time: 05/02/24  5:35 AM  Result Value Ref Range   Heparin  Unfractionated 0.20 (L) 0.30 - 0.70 IU/mL  CBC     Status: Abnormal   Collection Time: 05/02/24  5:35 AM  Result Value Ref Range   WBC 21.0 (H) 4.0 - 10.5 K/uL   RBC 2.95 (L) 4.22 - 5.81 MIL/uL   Hemoglobin 9.4 (L) 13.0 - 17.0 g/dL   HCT 68.4 (L) 60.9 - 47.9 %   MCV 106.8 (H) 80.0 - 100.0 fL   MCH 31.9 26.0 - 34.0 pg   MCHC 29.8 (L) 30.0 - 36.0 g/dL   RDW 78.5 (H) 88.4 - 84.4 %   Platelets 297 150 - 400 K/uL   nRBC 31.5 (H) 0.0 - 0.2 %  Glucose, capillary     Status: Abnormal   Collection Time: 05/02/24  8:07 AM  Result Value Ref Range   Glucose-Capillary 187 (H) 70 - 99 mg/dL    Assessment & Plan: Present on Admission:  Critical  polytrauma    LOS: 15 days   Additional comments:I reviewed the patient's new clinical lab test results. And CXR Injuries: - Right femur fracture - SDH/SAH - Nondepressed parietoocipital bone fracture - Trace left basilar pneumothorax - Bilateral rib fractures - Thoracic TP fractures - Possible T9-10 discoligamentous injury - Left psoas hemorrhage without extravasation  Plan: - Appreciate neurosurgery recommendations, Dr. Garst  - MRI thoracic spine done 1/2 - NS plan TLSO once sitting or standing  - Repeat [facility] 12/28 with stable ICH, but new occipital stroke, Dr. Merrianne consulted. MRI brain also done 1/2 - follow neuro exam - F/U CT H 1/9 slight improvement, no new findings - seddation held over 24h, not waking up yet  - Appreciate orthopaedics recommendations, Dr. Jerri, Dr. Kendal - ORIF right femur 12/29 (Dr. Jerri)  - Acute blood loss anemia   - Shock - hemorrhagic initially and now cardiogenic (CHF, afib s/p cardioversion and now amio), appreciate CCM management  including increased pressor needs with CRRT (levo and vaso)  - acute hypoxic ventilator dependent respiratory failure - 40% and PEEP 8, appreciate CCM management, weaning on 10/8 this AM  - ID - was on cefepime  for klebsiella PNA, re-CX 1/6 klebsialla and morganella, cont. Merrem . Resp panel neg.   - FEN - strict NPO, cortrak  - DVT - heparin  drip  - AKI and volume overload - CRRT, appreciate Dr. Tod help. Will discuss plan.  - Dispo - ICU, CRRT, vent weaning as able. Dr. Annella and I met with his wife. She wants to continue all aggressive efforts. We discussed the ongoing challenges/multiorgan system failure.  Appreciate CCM collaboration.  Critical Care Total Time*: 35 Minutes  Dann Hummer, MD, MPH, FACS Trauma & General Surgery Use [email or web address] to contact on call provider  05/02/2024  *Care during the described time interval was provided by me. I have reviewed this patient's available data,  including medical history, events of note, physical examination and test results as part of my evaluation.

## 2024-05-02 NOTE — Progress Notes (Signed)
 PHARMACY - ANTICOAGULATION CONSULT NOTE  Pharmacy Consult for heparin  gtt Indication: VTE treatment  Allergies[1]  Patient Measurements: Height: 5' 11 (180.3 cm) Weight: 98.9 kg (218 lb 0.6 oz) IBW/kg (Calculated) : 75.3 Heparin  dosing weight 99.5 kg  Vital Signs: Temp: 100.4 F (38 C) (01/09 1551) Temp Source: Axillary (01/09 1551) BP: 112/61 (01/09 1530) Pulse Rate: 114 (01/09 1530)  Labs: Recent Labs    04/29/24 1657 04/29/24 1658 04/30/24 0438 04/30/24 0839 05/01/24 0500 05/01/24 0817 05/01/24 1525 05/02/24 0535 05/02/24 1525  HGB  --   --  7.9*   < > 8.7* 10.2*  --  9.4*  --   HCT  --   --  25.9*   < > 28.5* 30.0*  --  31.5*  --   PLT  --   --  247  --  276  --   --  297  --   APTT 101*  --   --   --   --   --   --   --   --   HEPARINUNFRC  --    < > 0.53   < > 0.30  --   --  0.20* 0.20*  CREATININE  --   --  1.38*   < > 1.26*  1.28*  --  1.55* 1.44* 1.79*   < > = values in this interval not displayed.    Estimated Creatinine Clearance: 30.9 mL/min (A) (by C-G formula based on SCr of 1.79 mg/dL (H)).   Assessment: 89 yo M polytrauma including bilateral SDH and scattered SAH. Pt was found to have acute DVT in R and L legs. Notably, there is also large hematoma in groin throughout thigh. Pharmacy consulted for heparin  gtt for VTE treatment. Low goal, no bolus.  1/9 PM: HL still 0.20 - subtherapeutic. Despite increase to 1400 units/hr Confirmed with RN, no pauses or other issues with infusion / line.    Goal of Therapy:  Heparin  level 0.3-0.5 units/ml Monitor platelets by anticoagulation protocol: Yes   Plan:  Increase heparin  gtt to 1500 units/hr  AM Heparin  Level Daily HL, CBC  F/u s/sx bleeding and longterm anticoag plan  Larraine Brazier, PharmD Clinical Pharmacist 05/02/2024  4:56 PM **Pharmacist phone directory can now be found on [email or web address] ([email or web address]).  Listed under [facility].        [1]  Allergies Allergen Reactions   Bee Venom  Anaphylaxis   Doxycycline  Hyclate Anaphylaxis   Yellow Jacket Venom Anaphylaxis   Atorvastatin Other (See Comments)   Doxycycline  Hyclate     Other Reaction(s): tachycardia/afib   Finasteride     Other Reaction(s): weak urinary stream   Sulfamethoxazole     Other Reaction(s): rash/tachycardia/hypotension   Bactrim [Sulfamethoxazole-Trimethoprim] Hives and Rash   Rivaroxaban  Rash

## 2024-05-02 NOTE — Progress Notes (Signed)
 Nutrition Follow-up  DOCUMENTATION CODES:  Not applicable  INTERVENTION:  Tube feeding via Cortrak: - Pivot 1.5 to 50 ml/h (1200 ml per day) - ProSource TF20 60 ml TID - Provides 2040 kcal, 172 gm protein, 912 ml free water  daily  NUTRITION DIAGNOSIS:  Increased nutrient needs related to  (trauma) as evidenced by estimated needs. - Ongoing   GOAL:  Patient will meet greater than or equal to 90% of their needs - Met via TF  MONITOR:  I & O's  REASON FOR ASSESSMENT:  Consult  (CRRT)  ASSESSMENT:  Pt with PMH of Afib on Eliquis , cardiomegaly, GERD, hiatal hernia, HLD, HTN, OA, and vitamin D  deficiency who was admitted after falling off the roof with R femur fx, SDH/SAH, nondepressed parietoccipital bone fxs, bilateral rib fxs, thoracic TP fxs, possible T9-10 injury, and L psoas hemorrhage.  12/26 - s/p cortrak placement, gastric 12/28 - respiratory and neurologic decline after being laid flat for CT 12/29 - OR, repair of right femur fractures; remained intubated post-op 01/05 - CRRT initiated   01/07 - FMS placed  CCM MD in room speaking with family at time of RD visit. Discussed with RN, pt remains to tolerate tube feeds. Pressor requirements are coming down. Diarrhea started 2 days ago, but appears to have improved.   Current weight: 98.9 kg - noted to have deep pitting edema Admission weight: 89.4 kg  Nutrition Related Medications: Colace, Solu-Cortef , NovoLog  0-15 units q4h + 3 units q4h, Semglee  10 units BID, Protonix , Miralax  Drips  Amiodarone  Heparin  Meropenem  Levophed  Vasopressin  Labs: Sodium 137, Potassium 4.5, BUN 51, Creatinine 1.44, Phosphorus 3.7 CBG: 175-197 mg/dL x 24 hrs   I/O's UOP: 21 mL x 24 hrs CRRT: 5794 mL x 24 hrs Net: +15294 mL since admit  Diet Order:   Diet Order             Diet NPO time specified  Diet effective midnight                  EDUCATION NEEDS:  Not appropriate for education at this time  Skin:  Skin Assessment:  Reviewed RN Assessment  Last BM:  1/9 - Type 7 (large)  Height:  Ht Readings from Last 1 Encounters:  04/25/24 5' 11 (1.803 m)   Weight:  Wt Readings from Last 1 Encounters:  05/02/24 98.9 kg   Ideal Body Weight:  64.5 kg  BMI:  Body mass index is 30.41 kg/m.  Estimated Nutritional Needs:  Kcal:  1800-2000 Protein:  156-195 grams Fluid:  > 1.8 L/day   Nestora Glatter RD, LDN Registered Dietitian I Please see AMION for contact information

## 2024-05-02 NOTE — Progress Notes (Signed)
 PHARMACY NOTE:  ANTIMICROBIAL RENAL DOSAGE ADJUSTMENT  Current antimicrobial regimen includes a mismatch between antimicrobial dosage and estimated renal function.  As per policy approved by the Pharmacy & Therapeutics and Medical Executive Committees, the antimicrobial dosage will be adjusted accordingly.  Current antimicrobial dosage:  meropenem  1g q8h   Indication: sepsis   Renal Function:  Estimated Creatinine Clearance: 38.4 mL/min (A) (by C-G formula based on SCr of 1.44 mg/dL (H)). []      On intermittent HD, scheduled: []      On CRRT    Antimicrobial dosage has been changed to:  meropenem  1g q12h   Additional comments: CRRT stopped today 1/9    Thank you for allowing pharmacy to be a part of this patient's care.  Rankin Sams, [facility] 05/02/2024 11:37 AM

## 2024-05-02 NOTE — Progress Notes (Signed)
 "  NAME:  Tom Norton, MRN:  [id number], DOB:  03/19/1930, LOS: 15 ADMISSION DATE:  04/17/2024, CONSULTATION DATE: 04/22/2024 REFERRING MD:  Trauma, CHIEF COMPLAINT:  Shock   History of Present Illness:  89 year old man admitted as polytrauma after fall from deck with multiple orthopedic injuries, fracture, whom we are consulted for hypoxemia and shock.  Patient was admitted Christmas Day after fall from deck per report.  Initial images reveal left-sided rib fractures, right femur fracture, possible iliac fractures, left posterior subdural hematoma no midline shift, small left posterior parafalcine subdural hematoma, traumatic subarachnoid hemorrhage, skull fracture, trace left apical pneumothorax.  Chest x-ray 12/28 demonstrated left-sided atelectasis and small pleural effusion on my review interpretation.  He went for femur repair 12/29.  He named and remains intubated after procedure.  Chest x-ray 12/29 reveals ET tube placement, similar right-sided infiltrates and atelectasis, new rounded left-sided infiltrate on my review interpretation.  He developed shock now on maximum dose norepinephrine , vasopressin , started on phenylephrine  overnight.  He remains intubated.  Minimal vent settings.  He is on Precedex  for sedation.  Weaning Precedex .  Blood pressure get a bit better.  Review of labs reveal serial low hemoglobins.  He is getting a transfusion today.  He has had transfusions earlier this admission.  Pertinent Medical History:    has a past medical history of BPH (benign prostatic hyperplasia), Cardiomegaly, Dilated aortic root, Dysrhythmia, Enlarged prostate without lower urinary tract symptoms (luts), GERD (gastroesophageal reflux disease), History of hiatal hernia, Hyperlipidemia, Hypertension, Hypothyroidism, Insomnia, Junctional bradycardia, OA (osteoarthritis), Obesity, Paroxysmal A-fib (HCC), Peripheral neuropathy (07/17/2017), and Vitamin D  deficiency.  Significant Hospital  Events: Including procedures, antibiotic start and stop dates in addition to other pertinent events   12/25 admitted after fall 12/29 OR for femur fracture repair  12/30 PCCM consult, AFIB RVR overnight 1/5 RIJ Trialysis, CRRT started  Interim History / Subjective:  No acute events overnight  CT head yesterday stable/improved Became hypotensive this morning after UF was increased.  Now requiring levo 15 and escalating. Fluid removal dropped to even. Sedation off since 1/8 AM   Objective:   Blood pressure 126/75, pulse (!) 121, temperature 99.6 F (37.6 C), temperature source Axillary, resp. rate (!) 26, height 5' 11 (1.803 m), weight 98.9 kg, SpO2 97%.    Vent Mode: PRVC FiO2 (%):  [40 %] 40 % Set Rate:  [10 bmp-18 bmp] 18 bmp Vt Set:  [560 mL] 560 mL PEEP:  [8 cmH20] 8 cmH20 Pressure Support:  [14 cmH20] 14 cmH20 Plateau Pressure:  [18 cmH20-20 cmH20] 20 cmH20   Intake/Output Summary (Last 24 hours) at 05/02/2024 0923 Last data filed at 05/02/2024 0900 Gross per 24 hour  Intake 3705.12 ml  Output 5655.1 ml  Net -1949.98 ml   Filed Weights   04/30/24 0500 05/01/24 0500 05/02/24 0600  Weight: 112.6 kg 104.7 kg 98.9 kg   Physical Examination:  General:  Elderly male in NAD on vent Neuro: cough/gag intact, no verbal or pain response.  HEENT:  [place]/AT,  PERRL, no JVD Cardiovascular:  RRR, no MRG. Sinus tach. Pitting lower extremity edema Lungs:  Clear, bilateral breath sounds. On PSV 10/5 with RR 28 and volumes in the 450 range Abdomen:  Soft, non-distended, hypoactive Musculoskeletal:  No acute deformity Skin:  Intact, MMM    amiodarone  60 mg/hr (05/02/24 0900)   dexmedetomidine  (PRECEDEX ) IV infusion Stopped (05/01/24 0732)   feeding supplement (PIVOT 1.5 CAL) 50 mL/hr at 05/02/24 0900   heparin  1,400 Units/hr (05/02/24 0900)  meropenem  (MERREM ) IV 200 mL/hr at 05/02/24 0900   norepinephrine  (LEVOPHED ) Adult infusion 12 mcg/min (05/02/24 0900)   prismasol  BGK 4/2.5  500 mL/hr at 05/02/24 0620   prismasol  BGK 4/2.5 2,000 mL/hr at 05/02/24 0710   prismasol  BGK 4/2.5 400 mL/hr at 05/02/24 0514   propofol  (DIPRIVAN ) infusion Stopped (05/01/24 0732)   vasopressin  0.03 Units/min (05/02/24 0900)   Patient Lines/Drains/Airways Status     Active Line/Drains/Airways     Name Placement date Placement time Site Days   PICC Triple Lumen 04/17/24 Right Brachial 40 cm 0 cm 04/17/24  2320  -- 15   Hemodialysis Catheter Right Internal jugular Triple lumen Temporary (Non-Tunneled) 04/28/24  1000  Internal jugular  4   Negative Pressure Wound Therapy Knee Anterior;Left 02/18/21  1708  --  1169   Urethral Catheter [name], RN Latex 16 Fr. 04/18/24  0000  Latex  14   Fecal Management System 45 mL 04/30/24  1205  -- 2   Airway 7.5 mm 04/21/24  1211  -- 11   Small Bore Feeding Tube 10 Fr. Left nare Marking at nare/corner of mouth 61 cm 04/18/24  1610  Left nare  14   Wound 04/21/24 1333 Surgical Closed Surgical Incision Hip Right 04/21/24  1333  Hip  11           Assessment and Plan:    Polytrauma (TBI, rib fx, spinal fx, psoas hematoma, R femur fx post ORIF) - management per trauma service - No plans for further surgery  Type 1 resp failure- related to HCAP, volume overload Klebsiella HCAP on cefepime  x 7 days, broadened to vanc/meropenem  1/5 due to worsening O2 needs and CXR appearance - Full vent support - SBT today, tolerating 10/5, but not in robust fashion.  - Meropenem  add lineozlid for worsening leukocytosis and shock. Multiple sources. - Sputum cx growing klebsiella aerogenes (R to ctx and zosyn) and morganella (R to amp and unasyn) - DC foley - VAP bundle  Acute renal failure with total body fluid overload- unable to diurese so now on trial of CRRT - CRRT per Nephro - Goal 250 mL/Hr UF - tolerating well so far. Stable pressor requirements.  - Trend electrolytes - Potentially discontinue tomorrow.   Ongoing vasoplegia- related to  sedation mostly, now growing concern for alternate etiology with rapid escalation of NE this morning. Infectious? Intravascular volume depletion? - NE + vaso for MAP goal 65 - midodrine  continue - Stress steroids - Broaden ABX as above  Anemia- mostly phlebotomy at this point, no recent transfusion needs - Trend H&H  Afib/RVR related to atrial stretch, improved with amio - amiodarone  infusion continue - heparin  infusion per pharmacy - Tele  LE DVT- not helping things, would not be surprised if has some PE's - Heparin   Hypothyroid - TSH wnl - Synthroid  per tube   Hopefully with euvolemic and time off sedation he can improve enough to be extubated. Should have a lot more information in the next 24 to 48 hours including repeat CT head. If not improving despite the above therapies we are most likely seeing and end-of-life scenario.   52 minutes critical care time   Deward Eastern, AGACNP-BC [facility]  See Amion for personal pager PCCM on call pager [phone] until 7pm. Please call Elink 7p-7a. [phone]  05/02/2024 9:23 AM     "

## 2024-05-02 NOTE — Progress Notes (Signed)
 PHARMACY - ANTICOAGULATION CONSULT NOTE  Pharmacy Consult for heparin  gtt Indication: VTE treatment  Allergies[1]  Patient Measurements: Height: 5' 11 (180.3 cm) Weight: 98.9 kg (218 lb 0.6 oz) IBW/kg (Calculated) : 75.3 Heparin  dosing weight 99.5 kg  Vital Signs: Temp: 99.1 F (37.3 C) (01/09 0400) Temp Source: Axillary (01/09 0400) BP: 93/56 (01/09 0630) Pulse Rate: 116 (01/09 0630)  Labs: Recent Labs    04/29/24 1657 04/29/24 1658 04/30/24 0438 04/30/24 1434 04/30/24 1644 05/01/24 0500 05/01/24 1525 05/02/24 0535  HGB  --    < > 7.9*  --   --  8.7*  --  9.4*  HCT  --   --  25.9*  --   --  28.5*  --  31.5*  PLT  --   --  247  --   --  276  --  297  APTT 101*  --   --   --   --   --   --   --   HEPARINUNFRC  --    < > 0.53 0.41  --  0.30  --  0.20*  CREATININE  --   --  1.38*  --    < > 1.26*  1.28* 1.55* 1.44*   < > = values in this interval not displayed.    Estimated Creatinine Clearance: 38.4 mL/min (A) (by C-G formula based on SCr of 1.44 mg/dL (H)).   Assessment: 89 yo M polytrauma including bilateral SDH and scattered SAH. Pt was found to have acute DVT in R and L legs. Notably, there is also large hematoma in groin throughout thigh. Pharmacy consulted for heparin  gtt for VTE treatment. Low goal, no bolus.  HL 0.20 - subtherapeutic. Confirmed with RN, no pauses or other issues with infusion / line.    Goal of Therapy:  Heparin  level 0.3-0.5 units/ml Monitor platelets by anticoagulation protocol: Yes   Plan:  Increase in heparin  gtt to 1400 units/hr  8 hour heparin  level  Daily HL, CBC  F/u s/sx bleeding and longterm anticoag plan  Rankin Sams, PharmD, BCCCP Clinical Pharmacist     [1]  Allergies Allergen Reactions   Bee Venom Anaphylaxis   Doxycycline  Hyclate Anaphylaxis   Yellow Jacket Venom Anaphylaxis   Atorvastatin Other (See Comments)   Doxycycline  Hyclate     Other Reaction(s): tachycardia/afib   Finasteride     Other  Reaction(s): weak urinary stream   Sulfamethoxazole     Other Reaction(s): rash/tachycardia/hypotension   Bactrim [Sulfamethoxazole-Trimethoprim] Hives and Rash   Rivaroxaban  Rash

## 2024-05-02 NOTE — Plan of Care (Signed)
" °  Medical records reviewed including progress notes, labs, imaging and MAR. Per nephrology progress note 05/02/2024, CRRT placed on paused, will monitor for renal recovery.  Reviewed MAR 05/02/2024, on 2 pressors, pressor requirements weaning down.  Remains off sedation. Overall, no significant change in status over the last 24 hours.   Goals of care are clear for full code and full scope of treatment as of this time.  Plan remains to continue taking it day-by-day, hoping for improvement.  PMT will continue to follow for acute needs.  Thank you for referral and allowing PMT to assist in Mr. Tom Norton's care.   Kathlyne FALCON. Callista Hoh Jr., AGNP-C Palliative Medicine Team Phone: [phone]  NO CHARGE     "

## 2024-05-03 DIAGNOSIS — N179 Acute kidney failure, unspecified: Secondary | ICD-10-CM | POA: Diagnosis not present

## 2024-05-03 DIAGNOSIS — T07XXXA Unspecified multiple injuries, initial encounter: Secondary | ICD-10-CM | POA: Diagnosis not present

## 2024-05-03 DIAGNOSIS — R579 Shock, unspecified: Secondary | ICD-10-CM | POA: Diagnosis not present

## 2024-05-03 DIAGNOSIS — N189 Chronic kidney disease, unspecified: Secondary | ICD-10-CM | POA: Diagnosis not present

## 2024-05-03 DIAGNOSIS — D649 Anemia, unspecified: Secondary | ICD-10-CM | POA: Diagnosis not present

## 2024-05-03 DIAGNOSIS — I4891 Unspecified atrial fibrillation: Secondary | ICD-10-CM | POA: Diagnosis not present

## 2024-05-03 DIAGNOSIS — J189 Pneumonia, unspecified organism: Secondary | ICD-10-CM | POA: Diagnosis not present

## 2024-05-03 DIAGNOSIS — I129 Hypertensive chronic kidney disease with stage 1 through stage 4 chronic kidney disease, or unspecified chronic kidney disease: Secondary | ICD-10-CM | POA: Diagnosis not present

## 2024-05-03 DIAGNOSIS — J9601 Acute respiratory failure with hypoxia: Secondary | ICD-10-CM | POA: Diagnosis not present

## 2024-05-03 LAB — GLUCOSE, CAPILLARY
Glucose-Capillary: 136 mg/dL — ABNORMAL HIGH (ref 70–99)
Glucose-Capillary: 145 mg/dL — ABNORMAL HIGH (ref 70–99)
Glucose-Capillary: 163 mg/dL — ABNORMAL HIGH (ref 70–99)
Glucose-Capillary: 172 mg/dL — ABNORMAL HIGH (ref 70–99)
Glucose-Capillary: 196 mg/dL — ABNORMAL HIGH (ref 70–99)
Glucose-Capillary: 215 mg/dL — ABNORMAL HIGH (ref 70–99)

## 2024-05-03 LAB — RENAL FUNCTION PANEL
Albumin: 3 g/dL — ABNORMAL LOW (ref 3.5–5.0)
Anion gap: 23 — ABNORMAL HIGH (ref 5–15)
BUN: 110 mg/dL — ABNORMAL HIGH (ref 8–23)
CO2: 18 mmol/L — ABNORMAL LOW (ref 22–32)
Calcium: 8.5 mg/dL — ABNORMAL LOW (ref 8.9–10.3)
Chloride: 91 mmol/L — ABNORMAL LOW (ref 98–111)
Creatinine, Ser: 3.04 mg/dL — ABNORMAL HIGH (ref 0.61–1.24)
GFR, Estimated: 18 mL/min — ABNORMAL LOW
Glucose, Bld: 378 mg/dL — ABNORMAL HIGH (ref 70–99)
Phosphorus: 6.7 mg/dL — ABNORMAL HIGH (ref 2.5–4.6)
Potassium: 5.1 mmol/L (ref 3.5–5.1)
Sodium: 131 mmol/L — ABNORMAL LOW (ref 135–145)

## 2024-05-03 LAB — BASIC METABOLIC PANEL WITH GFR
Anion gap: 16 — ABNORMAL HIGH (ref 5–15)
BUN: 99 mg/dL — ABNORMAL HIGH (ref 8–23)
CO2: 23 mmol/L (ref 22–32)
Calcium: 9.2 mg/dL (ref 8.9–10.3)
Chloride: 95 mmol/L — ABNORMAL LOW (ref 98–111)
Creatinine, Ser: 2.62 mg/dL — ABNORMAL HIGH (ref 0.61–1.24)
GFR, Estimated: 22 mL/min — ABNORMAL LOW
Glucose, Bld: 181 mg/dL — ABNORMAL HIGH (ref 70–99)
Potassium: 5 mmol/L (ref 3.5–5.1)
Sodium: 134 mmol/L — ABNORMAL LOW (ref 135–145)

## 2024-05-03 LAB — CBC
HCT: 29.1 % — ABNORMAL LOW (ref 39.0–52.0)
Hemoglobin: 8.7 g/dL — ABNORMAL LOW (ref 13.0–17.0)
MCH: 31.8 pg (ref 26.0–34.0)
MCHC: 29.9 g/dL — ABNORMAL LOW (ref 30.0–36.0)
MCV: 106.2 fL — ABNORMAL HIGH (ref 80.0–100.0)
Platelets: 284 K/uL (ref 150–400)
RBC: 2.74 MIL/uL — ABNORMAL LOW (ref 4.22–5.81)
RDW: 21.7 % — ABNORMAL HIGH (ref 11.5–15.5)
WBC: 25.2 K/uL — ABNORMAL HIGH (ref 4.0–10.5)
nRBC: 22.6 % — ABNORMAL HIGH (ref 0.0–0.2)

## 2024-05-03 LAB — MAGNESIUM: Magnesium: 2.8 mg/dL — ABNORMAL HIGH (ref 1.7–2.4)

## 2024-05-03 LAB — PHOSPHORUS: Phosphorus: 6.3 mg/dL — ABNORMAL HIGH (ref 2.5–4.6)

## 2024-05-03 LAB — HEPARIN LEVEL (UNFRACTIONATED): Heparin Unfractionated: 0.34 [IU]/mL (ref 0.30–0.70)

## 2024-05-03 MED ORDER — INSULIN ASPART 100 UNIT/ML IJ SOLN
4.0000 [IU] | INTRAMUSCULAR | Status: DC
  Administered 2024-05-03 – 2024-05-04 (×5): 4 [IU] via SUBCUTANEOUS
  Filled 2024-05-03 (×5): qty 4

## 2024-05-03 MED ORDER — MIDODRINE HCL 5 MG PO TABS
15.0000 mg | ORAL_TABLET | Freq: Three times a day (TID) | ORAL | Status: DC
  Administered 2024-05-03 – 2024-05-05 (×7): 15 mg
  Filled 2024-05-03 (×7): qty 3

## 2024-05-03 MED ORDER — FUROSEMIDE 10 MG/ML IJ SOLN
80.0000 mg | Freq: Once | INTRAMUSCULAR | Status: AC
  Administered 2024-05-03: 80 mg via INTRAVENOUS
  Filled 2024-05-03: qty 8

## 2024-05-03 NOTE — Plan of Care (Signed)
" °  Medical records reviewed including progress notes, labs, imaging and MAR. CRRT stopped 05/02/2024. Reviewed nephrology progress note from 05/03/2024, plan for Lasix  challenge today.  No signs of renal recovery.  Reviewed critical care progress note from 05/03/2024: Blood pressure improved but still with 2 chronic pressor requirement.  No improvement in mental status.  Goals of care are clear for full code, full scope of treatment.  Plan is to continue to observe for improvement in the next 24 hours, if no improvement, consider revisiting goals of care with recommendation to transition to comfort measures only.  PMT will plan to reach out to family on 05/04/18/2026 for further GOC discussion.  PMT will continue to follow for acute needs.  Thank you for referral and allowing PMT to assist in  Tom Norton's care.   Kathlyne FALCON. Christella App Jr., AGNP-C Palliative Medicine Team Phone: [phone]  NO CHARGE     "

## 2024-05-03 NOTE — Progress Notes (Signed)
 [facility] Progress Note   Assessment/ Plan:   Assessment/Plan  AKI and volume overload: - making urine but not enough to make headway with volume unloading - will do CRRT- all 4K, will stop syringe hep since on a gtt now - time- limited trial--> paused CRRT 05/02/24 - pressors are up.  O2 requirement has improved but mentation has not - appreciate palliative care - Lasix  challenge today    2.  Critical polytrauma             - after falling off deck on christmas             - per trauma team   3.  Afib with RVR             - hopefully volume unloading will improve this             - on amio   4.  Acute hypoxic RF:             - volume mediates some             - FiO2 down, PS today  5.  Acute R DVT  - on systemic gtt now   5.  Dispo:             - In ICU  Subjective:    ON PS this AM.  Does not open eyes to voice today.  No signs of renal recovery   Objective:   BP (!) 98/54   Pulse (!) 108   Temp 99.4 F (37.4 C) (Axillary)   Resp 16   Ht 5' 11 (1.803 m)   Wt 102.3 kg   SpO2 100%   BMI 31.46 kg/m   Intake/Output Summary (Last 24 hours) at 05/03/2024 1128 Last data filed at 05/03/2024 1000 Gross per 24 hour  Intake 3405.7 ml  Output 100 ml  Net 3305.7 ml   Weight change: 3.4 kg  Physical Exam: Gen ill appearing elderly gentleman Heent ETT in place Neck R nontunneled HD cath PULM clear today CV RRR ABD soft EXT 4 limb anasarca-- much improved, scrotal edema much improved NEURO intubated, sedated ACCESS R internal jugular nontunneled HD cath  Imaging: DG CHEST PORT 1 VIEW Result Date: 05/02/2024 CLINICAL DATA:  Respiratory failure. EXAM: PORTABLE CHEST 1 VIEW COMPARISON:  Chest radiograph 04/30/2024 FINDINGS: Small to moderate pneumothorax, with small apical component, a medial component is difficult to delineate by radiograph. Right upper extremity PICC and right-sided dialysis catheter remain in place. Endotracheal and weighted  enteric tubes in place. No mediastinal shift. Stable heart size and mediastinal contours. Hazy bibasilar opacities likely atelectasis and pleural effusions. IMPRESSION: 1. Small to moderate right pneumothorax, with small apical component, a medial component is difficult to delineate by radiograph. No mediastinal shift. 2. Hazy bibasilar opacities likely atelectasis and pleural effusions. Electronically Signed   By: Andrea Gasman M.D.   On: 05/02/2024 15:11   CT HEAD WO CONTRAST ( ) Result Date: 05/01/2024 EXAM: CT HEAD WITHOUT CONTRAST 05/01/2024 02:42:00 PM TECHNIQUE: CT of the head was performed without the administration of intravenous contrast. Automated exposure control, iterative reconstruction, and/or weight based adjustment of the mA/kV was utilized to reduce the radiation dose to as low as reasonably achievable. COMPARISON: 04/20/2024 CLINICAL HISTORY: Head trauma, moderate-severe. FINDINGS: BRAIN AND VENTRICLES: Decreased volume of left sided subdural hemorrhage. The most confluent region of hemorrhage over the left parietal lobe measures up to 6 mm in thickness, previously measuring up to 8  mm when remeasured in a similar manner. Subdural hemorrhages decreased in extent and overall decreased in attenuation. Additional areas of perifalcine subdural hemorrhage are also decreased from prior. There is decreased hemorrhage along the tentorial leaflets. Similar appearance of primarily hypoattenuating right subdural hematoma measuring up to 4 mm in thickness. Decreased scattered areas of subarachnoid hemorrhage. No evidence of acute infarct. Stable left occipital infarct. Remote lacunar infarcts in the right basal ganglia. Generalized cerebral atrophy. No hydrocephalus. No extra-axial collection. No mass effect or midline shift. Nasogastric and endotracheal tubes in place. ORBITS: No acute abnormality. SINUSES: No acute abnormality. SOFT TISSUES AND SKULL: Nondisplaced left parietal skull fracture  redemonstrated. Small left mastoid effusion appears increased from prior. No acute soft tissue abnormality. IMPRESSION: 1. Decreased subdural and subarachnoid hemorrhage as described above. No new or enlarging intracranial hemorrhage. 2. No significant midline shift. 3. Nondisplaced left parietal skull fracture redemonstrated. Electronically signed by: Donnice Mania MD MD 05/01/2024 03:41 PM EST RP Workstation: [id number]    Labs: BMET Recent Labs  Lab 04/30/24 9561 04/30/24 9160 04/30/24 1644 05/01/24 0500 05/01/24 0817 05/01/24 1525 05/02/24 0535 05/02/24 1525 05/03/24 0537  NA 137   < > 136 132*  134* 135 137 137 128* 134*  K 3.8   < > 4.3 4.2  4.3 4.4 4.9 4.5 4.6 5.0  CL 105  --  101 96*  97*  --  100 100 89* 95*  CO2 21*  --  23 21*  22  --  24 24 20* 23  GLUCOSE 187*  --  231* 390*  349*  --  179* 192* 557* 181*  BUN 57*  --  55* 47*  48*  --  54* 51* 59* 99*  CREATININE 1.38*  --  1.42* 1.26*  1.28*  --  1.55* 1.44* 1.79* 2.62*  CALCIUM  7.5*  --  8.6* 7.9*  8.2*  --  9.3 9.1 8.3* 9.2  PHOS 2.1*  --  2.9 2.7  2.9  --  3.8 3.7 4.8*  4.8* 6.3*   < > = values in this interval not displayed.   CBC Recent Labs  Lab 04/26/24 2109 04/27/24 0538 04/30/24 0438 04/30/24 0839 05/01/24 0500 05/01/24 0817 05/02/24 0535 05/03/24 0537  WBC 16.5*   < > 17.1*  --  18.4*  --  21.0* 25.2*  NEUTROABS 13.5*  --   --   --   --   --   --   --   HGB 8.2*   < > 7.9*   < > 8.7* 10.2* 9.4* 8.7*  HCT 24.8*   < > 25.9*   < > 28.5* 30.0* 31.5* 29.1*  MCV 95.4   < > 101.6*  --  105.6*  --  106.8* 106.2*  PLT 113*   < > 247  --  276  --  297 284   < > = values in this interval not displayed.    Medications:     sodium chloride    Intravenous Once   acetaminophen  (TYLENOL ) oral liquid 160 mg/5 mL  650 mg Per Tube Q6H   amiodarone   150 mg Intravenous Once   artificial tears   Both Eyes QID   Chlorhexidine  Gluconate Cloth  6 each Topical Q0600   feeding supplement (PROSource TF20)   60 mL Per Tube TID   hydrocortisone  sod succinate (SOLU-CORTEF ) inj  100 mg Intravenous Q12H   insulin  aspart  0-15 Units Subcutaneous Q4H   insulin  aspart  4 Units Subcutaneous Q4H  insulin  glargine-yfgn  10 Units Subcutaneous BID   levothyroxine   50 mcg Per Tube QAC breakfast   midodrine   15 mg Per Tube TID WC   mouth rinse  15 mL Mouth Rinse Q2H   pantoprazole  (PROTONIX ) IV  40 mg Intravenous Q24H   polyethylene glycol  17 g Per Tube Daily   sodium chloride  flush  10-40 mL Intracatheter Q12H    Almarie Bonine, MD 05/03/2024, 11:28 AM

## 2024-05-03 NOTE — Plan of Care (Signed)
" °  Problem: Clinical Measurements: Goal: Ability to maintain clinical measurements within normal limits will improve Outcome: Progressing Goal: Will remain free from infection Outcome: Progressing Goal: Respiratory complications will improve Outcome: Progressing   Problem: Activity: Goal: Risk for activity intolerance will decrease Outcome: Progressing   Problem: Nutrition: Goal: Adequate nutrition will be maintained Outcome: Progressing   Problem: Elimination: Goal: Will not experience complications related to bowel motility Outcome: Progressing Goal: Will not experience complications related to urinary retention Outcome: Progressing   "

## 2024-05-03 NOTE — Progress Notes (Signed)
 12 Days Post-Op   Subjective/Chief Complaint: No acute changes  Con't on pressors Vent   Objective: Vital signs in last 24 hours: Temp:  [99.3 F (37.4 C)-100.6 F (38.1 C)] 99.4 F (37.4 C) (01/10 0800) Pulse Rate:  [102-131] 111 (01/10 0800) Resp:  [18-34] 31 (01/10 0800) BP: (64-138)/(47-87) 117/67 (01/10 0800) SpO2:  [85 %-100 %] 100 % (01/10 0800) FiO2 (%):  [40 %] 40 % (01/10 0800) Weight:  [102.3 kg] 102.3 kg (01/10 0500) Last BM Date : 05/02/24  Intake/Output from previous day: 01/09 0701 - 01/10 0700 In: 3595.2 [I.V.:1595.2; NG/GT:1200; IV Piggyback:800] Out: 540 [Stool:100] Intake/Output this shift: Total I/O In: 114.8 [I.V.:64.8; NG/GT:50] Out: -   Physical Exam:  General: on vent Neuro: pupils 2mm, no response to voice nor pain HEENT/Neck: ETT Resp: clear to auscultation bilaterally CVS: IRR GI: soft, NT Extremities: less edema, L thumb with purple patch, R knee rad patch  Lab Results:  Recent Labs    05/02/24 0535 05/03/24 0537  WBC 21.0* 25.2*  HGB 9.4* 8.7*  HCT 31.5* 29.1*  PLT 297 284   BMET Recent Labs    05/02/24 1525 05/03/24 0537  NA 128* 134*  K 4.6 5.0  CL 89* 95*  CO2 20* 23  GLUCOSE 557* 181*  BUN 59* 99*  CREATININE 1.79* 2.62*  CALCIUM  8.3* 9.2   PT/INR No results for input(s): LABPROT, INR in the last 72 hours. ABG Recent Labs    05/01/24 0817  HCO3 26.0    Studies/Results: DG CHEST PORT 1 VIEW Result Date: 05/02/2024 CLINICAL DATA:  Respiratory failure. EXAM: PORTABLE CHEST 1 VIEW COMPARISON:  Chest radiograph 04/30/2024 FINDINGS: Small to moderate pneumothorax, with small apical component, a medial component is difficult to delineate by radiograph. Right upper extremity PICC and right-sided dialysis catheter remain in place. Endotracheal and weighted enteric tubes in place. No mediastinal shift. Stable heart size and mediastinal contours. Hazy bibasilar opacities likely atelectasis and pleural effusions.  IMPRESSION: 1. Small to moderate right pneumothorax, with small apical component, a medial component is difficult to delineate by radiograph. No mediastinal shift. 2. Hazy bibasilar opacities likely atelectasis and pleural effusions. Electronically Signed   By: Andrea Gasman M.D.   On: 05/02/2024 15:11   CT HEAD WO CONTRAST ( ) Result Date: 05/01/2024 EXAM: CT HEAD WITHOUT CONTRAST 05/01/2024 02:42:00 PM TECHNIQUE: CT of the head was performed without the administration of intravenous contrast. Automated exposure control, iterative reconstruction, and/or weight based adjustment of the mA/kV was utilized to reduce the radiation dose to as low as reasonably achievable. COMPARISON: 04/20/2024 CLINICAL HISTORY: Head trauma, moderate-severe. FINDINGS: BRAIN AND VENTRICLES: Decreased volume of left sided subdural hemorrhage. The most confluent region of hemorrhage over the left parietal lobe measures up to 6 mm in thickness, previously measuring up to 8 mm when remeasured in a similar manner. Subdural hemorrhages decreased in extent and overall decreased in attenuation. Additional areas of perifalcine subdural hemorrhage are also decreased from prior. There is decreased hemorrhage along the tentorial leaflets. Similar appearance of primarily hypoattenuating right subdural hematoma measuring up to 4 mm in thickness. Decreased scattered areas of subarachnoid hemorrhage. No evidence of acute infarct. Stable left occipital infarct. Remote lacunar infarcts in the right basal ganglia. Generalized cerebral atrophy. No hydrocephalus. No extra-axial collection. No mass effect or midline shift. Nasogastric and endotracheal tubes in place. ORBITS: No acute abnormality. SINUSES: No acute abnormality. SOFT TISSUES AND SKULL: Nondisplaced left parietal skull fracture redemonstrated. Small left mastoid effusion appears increased from  prior. No acute soft tissue abnormality. IMPRESSION: 1. Decreased subdural and subarachnoid  hemorrhage as described above. No new or enlarging intracranial hemorrhage. 2. No significant midline shift. 3. Nondisplaced left parietal skull fracture redemonstrated. Electronically signed by: Donnice Mania MD MD 05/01/2024 03:41 PM EST RP Workstation: [id number]    Anti-infectives: Anti-infectives (From admission, onward)    Start     Dose/Rate Route Frequency Ordered Stop   05/02/24 2200  meropenem  (MERREM ) 1 g in sodium chloride  0.9 % 100 mL IVPB        1 g 200 mL/hr over 30 Minutes Intravenous Every 12 hours 05/02/24 1136     05/02/24 1100  linezolid  (ZYVOX ) IVPB 600 mg  Status:  Discontinued        600 mg 300 mL/hr over 60 Minutes Intravenous Every 12 hours 05/02/24 1006 05/03/24 0734   04/29/24 1000  vancomycin  (VANCOREADY) IVPB 1250 mg/250 mL  Status:  Discontinued        1,250 mg 166.7 mL/hr over 90 Minutes Intravenous Every 24 hours 04/28/24 1053 05/01/24 1059   04/28/24 1700  meropenem  (MERREM ) 1 g in sodium chloride  0.9 % 100 mL IVPB  Status:  Discontinued        1 g 200 mL/hr over 30 Minutes Intravenous Every 8 hours 04/28/24 1052 05/02/24 1136   04/28/24 1000  meropenem  (MERREM ) 1 g in sodium chloride  0.9 % 100 mL IVPB  Status:  Discontinued        1 g 200 mL/hr over 30 Minutes Intravenous Every 12 hours 04/28/24 0827 04/28/24 1052   04/28/24 0915  vancomycin  (VANCOREADY) IVPB 2000 mg/400 mL        2,000 mg 200 mL/hr over 120 Minutes Intravenous  Once 04/28/24 0827 04/28/24 1149   04/22/24 1115  ceFEPIme  (MAXIPIME ) 2 g in sodium chloride  0.9 % 100 mL IVPB  Status:  Discontinued        2 g 200 mL/hr over 30 Minutes Intravenous Every 12 hours 04/22/24 1024 04/28/24 0827   04/20/24 2045  ceFAZolin  (ANCEF ) IVPB 2g/100 mL premix       Note to Pharmacy: Anesthesia to give preop   2 g 200 mL/hr over 30 Minutes Intravenous  Once 04/20/24 1948 04/20/24 2053       Assessment/Plan: esent on Admission:  Critical polytrauma     Additional comments:I reviewed the patient's  new clinical lab test results. And CXR Injuries: - Right femur fracture - SDH/SAH - Nondepressed parietoocipital bone fracture - Trace left basilar pneumothorax - Bilateral rib fractures - Thoracic TP fractures - Possible T9-10 discoligamentous injury - Left psoas hemorrhage without extravasation   Plan: - Appreciate neurosurgery recommendations, Dr. Garst  - MRI thoracic spine done 1/2 - NS plan TLSO once sitting or standing  - Repeat [facility] 12/28 with stable ICH, but new occipital stroke, Dr. Merrianne consulted. MRI brain also done 1/2 - follow neuro exam - F/U CT H 1/9 slight improvement, no new findings - seddation held over 48h, not waking up yet   - Appreciate orthopaedics recommendations, Dr. Jerri, Dr. Kendal - ORIF right femur 12/29 (Dr. Jerri)   - Acute blood loss anemia    - Shock - hemorrhagic initially and now cardiogenic (CHF, afib s/p cardioversion and now amio), appreciate CCM management including increased pressor needs with recent CRRT- stopped (levo and vaso)   - acute hypoxic ventilator dependent respiratory failure - 40% and PEEP 8, appreciate CCM management, weaning on 10/8 this AM   - ID - was  on cefepime  for klebsiella PNA, re-CX 1/6 klebsialla and morganella, cont. Merrem . Resp panel neg.    - FEN - strict NPO, cortrak   - DVT - heparin  drip   - AKI and volume overload - CRRT, appreciate Dr. Tod help. Will discuss plan.   - Dispo - ICU, CRRT, vent weaning as able. Dr. Annella and I met with his wife. She wants to continue all supportive efforts.  Appreciate CCM collaboration.   Critical Care Total Time*: 32 Minutes  LOS: 16 days    [name] 05/03/2024

## 2024-05-03 NOTE — Progress Notes (Signed)
 "  NAME:  Tom Norton, MRN:  [id number], DOB:  [birth year], LOS: 16 ADMISSION DATE:  04/17/2024, CONSULTATION DATE: 04/22/2024 REFERRING MD:  Trauma, CHIEF COMPLAINT:  Shock   History of Present Illness:  89 year old man admitted as polytrauma after fall from deck with multiple orthopedic injuries, fracture, whom we are consulted for hypoxemia and shock.  Patient was admitted Christmas Day after fall from deck per report.  Initial images reveal left-sided rib fractures, right femur fracture, possible iliac fractures, left posterior subdural hematoma no midline shift, small left posterior parafalcine subdural hematoma, traumatic subarachnoid hemorrhage, skull fracture, trace left apical pneumothorax.  Chest x-ray 12/28 demonstrated left-sided atelectasis and small pleural effusion on my review interpretation.  He went for femur repair 12/29.  He named and remains intubated after procedure.  Chest x-ray 12/29 reveals ET tube placement, similar right-sided infiltrates and atelectasis, new rounded left-sided infiltrate on my review interpretation.  He developed shock now on maximum dose norepinephrine , vasopressin , started on phenylephrine  overnight.  He remains intubated.  Minimal vent settings.  He is on Precedex  for sedation.  Weaning Precedex .  Blood pressure get a bit better.  Review of labs reveal serial low hemoglobins.  He is getting a transfusion today.  He has had transfusions earlier this admission.  Pertinent Medical History:    has a past medical history of BPH (benign prostatic hyperplasia), Cardiomegaly, Dilated aortic root, Dysrhythmia, Enlarged prostate without lower urinary tract symptoms (luts), GERD (gastroesophageal reflux disease), History of hiatal hernia, Hyperlipidemia, Hypertension, Hypothyroidism, Insomnia, Junctional bradycardia, OA (osteoarthritis), Obesity, Paroxysmal A-fib (HCC), Peripheral neuropathy (07/17/2017), and Vitamin D  deficiency.  Significant Hospital  Events: Including procedures, antibiotic start and stop dates in addition to other pertinent events   12/25 admitted after fall 12/29 OR for femur fracture repair  12/30 PCCM consult, AFIB RVR overnight 1/5 RIJ Trialysis, CRRT started 1/9 CRRT stopped  Interim History / Subjective:  CRRT stopped.  Blood pressures have improved but still chronic 2 pressor requirement.  No improvement in mental status.  No renal recovery  Objective:   Blood pressure (!) 98/51, pulse (!) 114, temperature 99.4 F (37.4 C), temperature source Axillary, resp. rate (!) 26, height 5' 11 (1.803 m), weight 102.3 kg, SpO2 100%.    Vent Mode: PSV;CPAP FiO2 (%):  [40 %] 40 % Set Rate:  [18 bmp] 18 bmp Vt Set:  [560 mL] 560 mL PEEP:  [8 cmH20] 8 cmH20 Pressure Support:  [10 cmH20] 10 cmH20 Plateau Pressure:  [20 cmH20-23 cmH20] 20 cmH20   Intake/Output Summary (Last 24 hours) at 05/03/2024 1030 Last data filed at 05/03/2024 0934 Gross per 24 hour  Intake 3504.6 ml  Output 100 ml  Net 3404.6 ml   Filed Weights   05/01/24 0500 05/02/24 0600 05/03/24 0500  Weight: 104.7 kg 98.9 kg 102.3 kg   Physical Examination:  General: Lying in bed Neuro: Comatose, does not respond to verbal or noxious stimuli any extremity HEENT: ET tube in place, no icterus Cardiovascular: Giller rhythm Lungs: Normal work of breathing, tolerating pressure support Abdomen: Nondistended     amiodarone  60 mg/hr (05/03/24 0900)   dexmedetomidine  (PRECEDEX ) IV infusion Stopped (05/01/24 0732)   feeding supplement (PIVOT 1.5 CAL) 50 mL/hr at 05/03/24 0900   heparin  1,500 Units/hr (05/03/24 0927)   meropenem  (MERREM ) IV 1 g (05/03/24 0931)   norepinephrine  (LEVOPHED ) Adult infusion 6 mcg/min (05/03/24 0900)   prismasol  BGK 4/2.5 500 mL/hr at 05/02/24 0620   prismasol  BGK 4/2.5 2,000 mL/hr at 05/02/24 0710  prismasol  BGK 4/2.5 400 mL/hr at 05/02/24 0514   vasopressin  0.02 Units/min (05/03/24 0900)   Patient Lines/Drains/Airways  Status     Active Line/Drains/Airways     Name Placement date Placement time Site Days   PICC Triple Lumen 04/17/24 Right Brachial 40 cm 0 cm 04/17/24  2320  -- 16   Hemodialysis Catheter Right Internal jugular Triple lumen Temporary (Non-Tunneled) 04/28/24  1000  Internal jugular  5   Negative Pressure Wound Therapy Knee Anterior;Left 02/18/21  1708  --  1170   Fecal Management System 45 mL 04/30/24  1205  -- 3   Airway 7.5 mm 04/21/24  1211  -- 12   Small Bore Feeding Tube 10 Fr. Left nare Marking at nare/corner of mouth 61 cm 04/18/24  1610  Left nare  15   Wound 04/21/24 1333 Surgical Closed Surgical Incision Hip Right 04/21/24  1333  Hip  12           Assessment and Plan:    Shock: Presumably related to fluid removal, hypovolemia although at risk of infection -- MAP goal greater than 65, escalating dose of vasopressors, active titration of vasoactive infusions --Continue meropenem  as below, stop linezolid  with improved Bps after stopping CRRT/UF -- Foley removed 1/9 -- Continue stress dose steroids, wean off 1/11 --CRRT has been stopped, no further fluid removal at this time --Increase midodrine  to 15 mg 3 times daily per tube   Metabolic versus toxic encephalopathy: Now off sedatives for more days.  Has been off long-term opiate drip for several days.  At risk for toxic encephalopathy from sedation.  At risk of metabolic encephalopathy due to critical illness. --Dilaudid  removed from as needed medication, scheduled low-dose oxycodone  stopped 1/9 --Assess for improvement, unfortunately has worsened despite CRRT, uremia unlikely to be contributing   Acute hypoxemic respiratory failure: Preceding intubation, remains intubated after procedure 12/29.   -- PRVC, VAP bundle, stress ulcer prophylaxis with pressure support trials -- Antibiotics and status post aggressive removal via CRRT --Encephalopathy precludes extubation   Pulmonary edema: --Chest x-ray improved after fluid  removal via CRRT   Hospital-acquired pneumonia: Multiple organisms on culture. --Continue meropenem    AKI on CKD: Creatinine has wax and waned, placed on CRRT with significant volume overload -- CRRT discontinued 1/9, assess renal recovery -- Resumption of dialysis is futile in my opinion --Discussed with nephrology, Lasix  challenge, appreciate their assistance   AFIB w/ RVR:  -- Continue amiodarone , heparin  for stroke prophylaxis  Discussed at length with surgery team who are primary team.  We are in agreement that escalation of care is futile.  We agree with ongoing goals of care with recommendation for comfort if no improvement in the next 24 hours.   CRITICAL CARE Performed by: Donnice SAUNDERS Nadra Hritz   Total critical care time: 40 minutes  Critical care time was exclusive of separately billable procedures and treating other patients.  Critical care was necessary to treat or prevent imminent or life-threatening deterioration.  Critical care was time spent personally by me on the following activities: development of treatment plan with patient and/or surrogate as well as nursing, discussions with consultants, evaluation of patient's response to treatment, examination of patient, obtaining history from patient or surrogate, ordering and performing treatments and interventions, ordering and review of laboratory studies, ordering and review of radiographic studies, pulse oximetry and re-evaluation of patient's condition.   Donnice SAUNDERS Beals, MD  See Tracey for personal pager PCCM on call pager [phone] until 7pm. Please call  [facility] 7p-7a. [phone]  05/03/2024 10:30 AM     "

## 2024-05-03 NOTE — Progress Notes (Signed)
 PHARMACY - ANTICOAGULATION CONSULT NOTE  Pharmacy Consult for heparin  gtt Indication: VTE treatment  Allergies[1]  Patient Measurements: Height: 5' 11 (180.3 cm) Weight: 102.3 kg (225 lb 8.5 oz) IBW/kg (Calculated) : 75.3 Heparin  dosing weight 99.5 kg  Vital Signs: Temp: 99.4 F (37.4 C) (01/10 0800) Temp Source: Axillary (01/10 0800) BP: 112/72 (01/10 0747) Pulse Rate: 117 (01/10 0747)  Labs: Recent Labs    05/01/24 0500 05/01/24 0817 05/01/24 1525 05/02/24 0535 05/02/24 1525 05/03/24 0537  HGB 8.7* 10.2*  --  9.4*  --  8.7*  HCT 28.5* 30.0*  --  31.5*  --  29.1*  PLT 276  --   --  297  --  284  HEPARINUNFRC 0.30  --   --  0.20* 0.20* 0.34  CREATININE 1.26*  1.28*  --    < > 1.44* 1.79* 2.62*   < > = values in this interval not displayed.    Estimated Creatinine Clearance: 21.5 mL/min (A) (by C-G formula based on SCr of 2.62 mg/dL (H)).   Assessment: 89 yo M polytrauma including bilateral SDH and scattered SAH. Pt was found to have acute DVT in R and L legs. Notably, there is also large hematoma in groin throughout thigh. Pharmacy consulted for heparin  gtt for VTE treatment. Low goal, no bolus.  HL 0.34- therapeutic CRRT stopped 1/9; Hgb 8.7, Plt 284  Goal of Therapy:  Heparin  level 0.3-0.5 units/ml Monitor platelets by anticoagulation protocol: Yes   Plan:  Continue heparin  gtt at 1500 units/hr  AM Heparin  Level Daily HL, CBC  F/u s/sx bleeding and longterm anticoag plan  Sharyne Glatter, PharmD, BCCCP Critical Care Clinical Pharmacist 05/03/2024 8:25 AM        [1]  Allergies Allergen Reactions   Bee Venom Anaphylaxis   Doxycycline  Hyclate Anaphylaxis   Yellow Jacket Venom Anaphylaxis   Atorvastatin Other (See Comments)   Doxycycline  Hyclate     Other Reaction(s): tachycardia/afib   Finasteride     Other Reaction(s): weak urinary stream   Sulfamethoxazole     Other Reaction(s): rash/tachycardia/hypotension   Bactrim  [Sulfamethoxazole-Trimethoprim] Hives and Rash   Rivaroxaban  Rash

## 2024-05-04 DIAGNOSIS — N179 Acute kidney failure, unspecified: Secondary | ICD-10-CM | POA: Diagnosis not present

## 2024-05-04 DIAGNOSIS — J9601 Acute respiratory failure with hypoxia: Secondary | ICD-10-CM | POA: Diagnosis not present

## 2024-05-04 DIAGNOSIS — D649 Anemia, unspecified: Secondary | ICD-10-CM | POA: Diagnosis not present

## 2024-05-04 DIAGNOSIS — J189 Pneumonia, unspecified organism: Secondary | ICD-10-CM | POA: Diagnosis not present

## 2024-05-04 DIAGNOSIS — Z992 Dependence on renal dialysis: Secondary | ICD-10-CM

## 2024-05-04 DIAGNOSIS — R579 Shock, unspecified: Secondary | ICD-10-CM | POA: Diagnosis not present

## 2024-05-04 DIAGNOSIS — G9340 Encephalopathy, unspecified: Secondary | ICD-10-CM

## 2024-05-04 DIAGNOSIS — I129 Hypertensive chronic kidney disease with stage 1 through stage 4 chronic kidney disease, or unspecified chronic kidney disease: Secondary | ICD-10-CM | POA: Diagnosis not present

## 2024-05-04 DIAGNOSIS — T07XXXA Unspecified multiple injuries, initial encounter: Secondary | ICD-10-CM | POA: Diagnosis not present

## 2024-05-04 DIAGNOSIS — I4891 Unspecified atrial fibrillation: Secondary | ICD-10-CM | POA: Diagnosis not present

## 2024-05-04 DIAGNOSIS — N189 Chronic kidney disease, unspecified: Secondary | ICD-10-CM | POA: Diagnosis not present

## 2024-05-04 LAB — RENAL FUNCTION PANEL
Albumin: 3.2 g/dL — ABNORMAL LOW (ref 3.5–5.0)
Anion gap: 22 — ABNORMAL HIGH (ref 5–15)
BUN: 146 mg/dL — ABNORMAL HIGH (ref 8–23)
CO2: 17 mmol/L — ABNORMAL LOW (ref 22–32)
Calcium: 9.1 mg/dL (ref 8.9–10.3)
Chloride: 95 mmol/L — ABNORMAL LOW (ref 98–111)
Creatinine, Ser: 3.99 mg/dL — ABNORMAL HIGH (ref 0.61–1.24)
GFR, Estimated: 13 mL/min — ABNORMAL LOW
Glucose, Bld: 245 mg/dL — ABNORMAL HIGH (ref 70–99)
Phosphorus: 7.7 mg/dL — ABNORMAL HIGH (ref 2.5–4.6)
Potassium: 5.3 mmol/L — ABNORMAL HIGH (ref 3.5–5.1)
Sodium: 134 mmol/L — ABNORMAL LOW (ref 135–145)

## 2024-05-04 LAB — HEPATIC FUNCTION PANEL
ALT: 30 U/L (ref 0–44)
AST: 35 U/L (ref 15–41)
Albumin: 3.2 g/dL — ABNORMAL LOW (ref 3.5–5.0)
Alkaline Phosphatase: 131 U/L — ABNORMAL HIGH (ref 38–126)
Bilirubin, Direct: 0.4 mg/dL — ABNORMAL HIGH (ref 0.0–0.2)
Indirect Bilirubin: 0.3 mg/dL (ref 0.3–0.9)
Total Bilirubin: 0.7 mg/dL (ref 0.0–1.2)
Total Protein: 5.6 g/dL — ABNORMAL LOW (ref 6.5–8.1)

## 2024-05-04 LAB — GLUCOSE, CAPILLARY
Glucose-Capillary: 157 mg/dL — ABNORMAL HIGH (ref 70–99)
Glucose-Capillary: 162 mg/dL — ABNORMAL HIGH (ref 70–99)
Glucose-Capillary: 163 mg/dL — ABNORMAL HIGH (ref 70–99)
Glucose-Capillary: 179 mg/dL — ABNORMAL HIGH (ref 70–99)
Glucose-Capillary: 186 mg/dL — ABNORMAL HIGH (ref 70–99)
Glucose-Capillary: 224 mg/dL — ABNORMAL HIGH (ref 70–99)

## 2024-05-04 LAB — CBC
HCT: 28.1 % — ABNORMAL LOW (ref 39.0–52.0)
Hemoglobin: 8.2 g/dL — ABNORMAL LOW (ref 13.0–17.0)
MCH: 31.5 pg (ref 26.0–34.0)
MCHC: 29.2 g/dL — ABNORMAL LOW (ref 30.0–36.0)
MCV: 108.1 fL — ABNORMAL HIGH (ref 80.0–100.0)
Platelets: 271 K/uL (ref 150–400)
RBC: 2.6 MIL/uL — ABNORMAL LOW (ref 4.22–5.81)
RDW: 22 % — ABNORMAL HIGH (ref 11.5–15.5)
WBC: 27.3 K/uL — ABNORMAL HIGH (ref 4.0–10.5)
nRBC: 23.8 % — ABNORMAL HIGH (ref 0.0–0.2)

## 2024-05-04 LAB — HEPARIN LEVEL (UNFRACTIONATED): Heparin Unfractionated: 0.37 [IU]/mL (ref 0.30–0.70)

## 2024-05-04 LAB — MAGNESIUM: Magnesium: 2.8 mg/dL — ABNORMAL HIGH (ref 1.7–2.4)

## 2024-05-04 LAB — PHOSPHORUS: Phosphorus: 7.8 mg/dL — ABNORMAL HIGH (ref 2.5–4.6)

## 2024-05-04 MED ORDER — FUROSEMIDE 10 MG/ML IJ SOLN
100.0000 mg | Freq: Once | INTRAVENOUS | Status: AC
  Administered 2024-05-04: 100 mg via INTRAVENOUS
  Filled 2024-05-04: qty 10

## 2024-05-04 MED ORDER — SODIUM ZIRCONIUM CYCLOSILICATE 10 G PO PACK
10.0000 g | PACK | Freq: Two times a day (BID) | ORAL | Status: DC
  Administered 2024-05-04 (×2): 10 g
  Filled 2024-05-04 (×2): qty 1

## 2024-05-04 MED ORDER — INSULIN ASPART 100 UNIT/ML IJ SOLN
6.0000 [IU] | INTRAMUSCULAR | Status: DC
  Administered 2024-05-04 – 2024-05-05 (×5): 6 [IU] via SUBCUTANEOUS
  Filled 2024-05-04 (×5): qty 6

## 2024-05-04 MED ORDER — PANTOPRAZOLE SODIUM 40 MG IV SOLR: 40.0000 mg | INTRAVENOUS | Status: DC

## 2024-05-04 MED ORDER — HYDROCORTISONE SOD SUC (PF) 100 MG IJ SOLR
100.0000 mg | Freq: Two times a day (BID) | INTRAMUSCULAR | Status: DC
  Administered 2024-05-04: 100 mg via INTRAVENOUS
  Filled 2024-05-04: qty 2

## 2024-05-04 MED ORDER — POLYETHYLENE GLYCOL 3350 17 G PO PACK: 17.0000 g | PACK | Freq: Every day | ORAL | Status: DC | PRN

## 2024-05-04 MED ORDER — ACETAMINOPHEN 160 MG/5ML PO SOLN
650.0000 mg | Freq: Four times a day (QID) | ORAL | Status: DC
  Administered 2024-05-04 – 2024-05-05 (×3): 650 mg
  Filled 2024-05-04 (×3): qty 20.3

## 2024-05-04 NOTE — Progress Notes (Signed)
 Pressor requirement quickly increasing in afternoon. Dr. Annella notified of clinical change. RN provided education to family members (wife, son, daughter-in-law) at bedside that rapidly increasing pressor requirement may be indicative of body continuing to shut down and general clinical worsening. RN advised family to call any other family members who would like to see patient.

## 2024-05-04 NOTE — Progress Notes (Signed)
 PHARMACY - ANTICOAGULATION CONSULT NOTE  Pharmacy Consult for heparin  gtt Indication: VTE treatment  Allergies[1]  Patient Measurements: Height: 5' 11 (180.3 cm) Weight: 102.4 kg (225 lb 12 oz) IBW/kg (Calculated) : 75.3 Heparin  dosing weight 99.5 kg  Vital Signs: Temp: 98.9 F (37.2 C) (01/11 0400) Temp Source: Axillary (01/11 0400) BP: 97/48 (01/11 0700) Pulse Rate: 95 (01/11 0700)  Labs: Recent Labs    05/02/24 0535 05/02/24 1525 05/03/24 0537 05/03/24 1645 05/04/24 0546  HGB 9.4*  --  8.7*  --  8.2*  HCT 31.5*  --  29.1*  --  28.1*  PLT 297  --  284  --  271  HEPARINUNFRC 0.20* 0.20* 0.34  --  0.37  CREATININE 1.44* 1.79* 2.62* 3.04* 3.99*    Estimated Creatinine Clearance: 14.1 mL/min (A) (by C-G formula based on SCr of 3.99 mg/dL (H)).   Assessment: 89 yo M polytrauma including bilateral SDH and scattered SAH. Pt was found to have acute DVT in R and L legs. Notably, there is also large hematoma in groin throughout thigh. Last CTH 1/8 stable SDH/SAH with no new or enlarging intracranial hemorrhage. Pharmacy consulted for heparin  gtt for VTE treatment.  Low goal, no bolus.  HL 0.37- therapeutic Hgb 8.2 Plt 271  Goal of Therapy:  Heparin  level 0.3-0.5 units/ml Monitor platelets by anticoagulation protocol: Yes   Plan:  Continue heparin  gtt at 1500 units/hr  Daily Heparin  Level Daily HL, CBC  F/u s/sx bleeding and longterm anticoag plan  Sharyne Glatter, PharmD, BCCCP Critical Care Clinical Pharmacist 05/04/2024 8:05 AM     [1]  Allergies Allergen Reactions   Bee Venom Anaphylaxis   Doxycycline  Hyclate Anaphylaxis   Yellow Jacket Venom Anaphylaxis   Atorvastatin Other (See Comments)   Doxycycline  Hyclate     Other Reaction(s): tachycardia/afib   Finasteride     Other Reaction(s): weak urinary stream   Sulfamethoxazole     Other Reaction(s): rash/tachycardia/hypotension   Bactrim [Sulfamethoxazole-Trimethoprim] Hives and Rash    Rivaroxaban  Rash

## 2024-05-04 NOTE — Progress Notes (Signed)
 VENTILATOR WEAN NOTE 05/04/2024  Start Mode: PRVC  Wean Mode: Pressure Support  Duration before failure: Currently on wean   Reason for failure: N/A  Notes:  PT placed on wean CPAP 8/PS 8. PT tolerating well at this time. Vitals are stable and sats are 100%. RT will continue to monitor.

## 2024-05-04 NOTE — Progress Notes (Signed)
 13 Days Post-Op   Subjective/Chief Complaint: No acute changes Con't pressors    Objective: Vital signs in last 24 hours: Temp:  [98.9 F (37.2 C)-101 F (38.3 C)] 98.9 F (37.2 C) (01/11 0400) Pulse Rate:  [89-118] 95 (01/11 0700) Resp:  [14-33] 22 (01/11 0700) BP: (92-131)/(47-90) 97/48 (01/11 0700) SpO2:  [97 %-100 %] 100 % (01/11 0700) FiO2 (%):  [40 %] 40 % (01/11 0436) Weight:  [102.4 kg] 102.4 kg (01/11 0500) Last BM Date : 05/03/24  Intake/Output from previous day: 01/10 0701 - 01/11 0700 In: 3059.4 [I.V.:1539.4; NG/GT:1320; IV Piggyback:200] Out: 1000 [Stool:1000] Intake/Output this shift: No intake/output data recorded.  Physical Exam:  General: on vent Neuro: pupils 2mm, no response to voice nor pain HEENT/Neck: ETT Resp: clear to auscultation bilaterally CVS: IRR GI: soft, NT Extremities: less edema, L thumb with purple patch, R knee rad patch  Lab Results:  Recent Labs    05/03/24 0537 05/04/24 0546  WBC 25.2* 27.3*  HGB 8.7* 8.2*  HCT 29.1* 28.1*  PLT 284 271   BMET Recent Labs    05/03/24 1645 05/04/24 0546  NA 131* 134*  K 5.1 5.3*  CL 91* 95*  CO2 18* 17*  GLUCOSE 378* 245*  BUN 110* 146*  CREATININE 3.04* 3.99*  CALCIUM  8.5* 9.1   PT/INR No results for input(s): LABPROT, INR in the last 72 hours. ABG Recent Labs    05/01/24 0817  HCO3 26.0    Studies/Results: No results found.  Anti-infectives: Anti-infectives (From admission, onward)    Start     Dose/Rate Route Frequency Ordered Stop   05/02/24 2200  meropenem  (MERREM ) 1 g in sodium chloride  0.9 % 100 mL IVPB        1 g 200 mL/hr over 30 Minutes Intravenous Every 12 hours 05/02/24 1136     05/02/24 1100  linezolid  (ZYVOX ) IVPB 600 mg  Status:  Discontinued        600 mg 300 mL/hr over 60 Minutes Intravenous Every 12 hours 05/02/24 1006 05/03/24 0734   04/29/24 1000  vancomycin  (VANCOREADY) IVPB 1250 mg/250 mL  Status:  Discontinued        1,250 mg 166.7  mL/hr over 90 Minutes Intravenous Every 24 hours 04/28/24 1053 05/01/24 1059   04/28/24 1700  meropenem  (MERREM ) 1 g in sodium chloride  0.9 % 100 mL IVPB  Status:  Discontinued        1 g 200 mL/hr over 30 Minutes Intravenous Every 8 hours 04/28/24 1052 05/02/24 1136   04/28/24 1000  meropenem  (MERREM ) 1 g in sodium chloride  0.9 % 100 mL IVPB  Status:  Discontinued        1 g 200 mL/hr over 30 Minutes Intravenous Every 12 hours 04/28/24 0827 04/28/24 1052   04/28/24 0915  vancomycin  (VANCOREADY) IVPB 2000 mg/400 mL        2,000 mg 200 mL/hr over 120 Minutes Intravenous  Once 04/28/24 0827 04/28/24 1149   04/22/24 1115  ceFEPIme  (MAXIPIME ) 2 g in sodium chloride  0.9 % 100 mL IVPB  Status:  Discontinued        2 g 200 mL/hr over 30 Minutes Intravenous Every 12 hours 04/22/24 1024 04/28/24 0827   04/20/24 2045  ceFAZolin  (ANCEF ) IVPB 2g/100 mL premix       Note to Pharmacy: Anesthesia to give preop   2 g 200 mL/hr over 30 Minutes Intravenous  Once 04/20/24 1948 04/20/24 2053       Assessment/Plan: Critical polytrauma  Additional comments:I reviewed the patient's new clinical lab test results. And CXR Injuries: - Right femur fracture - SDH/SAH - Nondepressed parietoocipital bone fracture - Trace left basilar pneumothorax - Bilateral rib fractures - Thoracic TP fractures - Possible T9-10 discoligamentous injury - Left psoas hemorrhage without extravasation   Plan: - Appreciate neurosurgery recommendations, Dr. Garst - MRI thoracic spine done 1/2 - NS plan TLSO once sitting or standing - Repeat [facility] 12/28 with stable ICH, but new occipital stroke, Dr. Merrianne consulted. MRI brain also done 1/2 - follow neuro exam - F/U CT H 1/9 slight improvement, no new findings - seddation held, not waking up yet   - Appreciate orthopaedics recommendations, Dr. Jerri, Dr. Kendal - ORIF right femur 12/29 (Dr. Jerri)   - Acute blood loss anemia    - Shock - hemorrhagic initially and now  cardiogenic (CHF, afib s/p cardioversion and now amio), appreciate CCM management including increased pressor needs with recent CRRT- stopped (levo and vaso)   - acute hypoxic ventilator dependent respiratory failure - 40% and PEEP 8, appreciate CCM management, weaning on 10/8 this AM   - ID - was on cefepime  for klebsiella PNA, re-CX 1/6 klebsialla and morganella, cont. Merrem . Resp panel neg.    - FEN - strict NPO, cortrak   - DVT - heparin  drip   - AKI and volume overload - CRRT, appreciate Dr. Tod help.    - Dispo - ICU, CRRT, vent weaning as able. Wife wants to continue all supportive efforts.  Appreciate CCM collaboration.   Critical Care Total Time*: 30 Minutes  LOS: 17 days    [name] 05/04/2024

## 2024-05-04 NOTE — Progress Notes (Signed)
 Nursing Goals of Care Update:  This RN called to bedside by patient's son who wanted to speak with Dr. Annella. RN informed son that Dr. Annella had likely left for the day, but that we could talk if he would like. Many new family members at bedside who requested a general overview of events during patient's stay and prognosis.   I spoke with family and relayed to them the general clinical course of Tom Norton's hospitalization including initial traumatic presentation, surgery, shock, and progression into renal failure. We discussed CRRT initiation with the goal of (1) improving mental status and (2) reducing fluid overload. We then discussed that, after stopping CRRT, Tom Norton's mental status did not improve and his fluid volume status is worsening due to his inability to produce urine despite two Lasix  trials. Several family members asked if there was anything else for us  to do to help his kidneys functions and I shared that we have done all we can do and restarting CRRT would likely cause more harm than good.  Tom Norton's son asked if there was any way that the breathing tube could come out if they decided they no longer wanted to pursue full support. I explained to them the difference between DNR- no escalation of care, which is our current course and comfort care. I described that our focus would shift to making sure Tom Norton passes with dignity and comfort and that we would do so by removing the life support interventions we are currently maintaining. I also explained that by doing so, we would likely speed up the natural process of dying but would not be prolonging suffering. We discussed in detail the process of discontinuing mechanical ventilation and vasopressor support. Many family members including Tom Norton's son were in agreeance with this. However, Tom Norton's wife stated that she didn't know what to say and was not sure.   I encouraged Tom Norton's wife and the rest of the family that, since the attending providers had  left for the day anyways, it was appropriate to take one more night and discuss this potential plan to transition to comfort care with the doctors tomorrow. Family members did ask if Tom Norton is suffering, which I responded that he is uncomfortable but is likely not overtly aware of his discomfort due to his decreased mentation. I did point out that choosing to transition to comfort care is not giving up on Tom Norton, it is instead caring and speaking for him when he cannot speak for himself. All family members reassured by this and stated that they would like to make the transition to comfort care tomorrow.

## 2024-05-04 NOTE — Progress Notes (Signed)
 "  NAME:  Tom Norton, MRN:  [id number], DOB:  Oct 08, 1929, LOS: 17 ADMISSION DATE:  04/17/2024, CONSULTATION DATE: 04/22/2024 REFERRING MD:  Trauma, CHIEF COMPLAINT:  Shock   History of Present Illness:  89 year old man admitted as polytrauma after fall from deck with multiple orthopedic injuries, fracture, whom we are consulted for hypoxemia and shock.  Patient was admitted Christmas Day after fall from deck per report.  Initial images reveal left-sided rib fractures, right femur fracture, possible iliac fractures, left posterior subdural hematoma no midline shift, small left posterior parafalcine subdural hematoma, traumatic subarachnoid hemorrhage, skull fracture, trace left apical pneumothorax.  Chest x-ray 12/28 demonstrated left-sided atelectasis and small pleural effusion on my review interpretation.  He went for femur repair 12/29.  He named and remains intubated after procedure.  Chest x-ray 12/29 reveals ET tube placement, similar right-sided infiltrates and atelectasis, new rounded left-sided infiltrate on my review interpretation.  He developed shock now on maximum dose norepinephrine , vasopressin , started on phenylephrine  overnight.  He remains intubated.  Minimal vent settings.  He is on Precedex  for sedation.  Weaning Precedex .  Blood pressure get a bit better.  Review of labs reveal serial low hemoglobins.  He is getting a transfusion today.  He has had transfusions earlier this admission.  Pertinent Medical History:    has a past medical history of BPH (benign prostatic hyperplasia), Cardiomegaly, Dilated aortic root, Dysrhythmia, Enlarged prostate without lower urinary tract symptoms (luts), GERD (gastroesophageal reflux disease), History of hiatal hernia, Hyperlipidemia, Hypertension, Hypothyroidism, Insomnia, Junctional bradycardia, OA (osteoarthritis), Obesity, Paroxysmal A-fib (HCC), Peripheral neuropathy (07/17/2017), and Vitamin D  deficiency.  Significant Hospital  Events: Including procedures, antibiotic start and stop dates in addition to other pertinent events   12/25 admitted after fall 12/29 OR for femur fracture repair  12/30 PCCM consult, AFIB RVR overnight 1/5 RIJ Trialysis, CRRT started 1/9 CRRT stopped 1/10, Lasix  trial no improvement, unchanged  Interim History / Subjective:  Essentially anuric after Lasix  yesterday.  Remains on 2 vasopressors.  No improvement in mental status.  Objective:   Blood pressure (!) 95/45, pulse 96, temperature 99.7 F (37.6 C), temperature source Axillary, resp. rate 20, height 5' 11 (1.803 m), weight 102.4 kg, SpO2 98%.    Vent Mode: PSV;CPAP FiO2 (%):  [40 %] 40 % Set Rate:  [18 bmp] 18 bmp Vt Set:  [560 mL] 560 mL PEEP:  [8 cmH20] 8 cmH20 Pressure Support:  [8 cmH20-10 cmH20] 8 cmH20 Plateau Pressure:  [21 cmH20-23 cmH20] 22 cmH20   Intake/Output Summary (Last 24 hours) at 05/04/2024 1051 Last data filed at 05/04/2024 0911 Gross per 24 hour  Intake 2623.51 ml  Output 1000 ml  Net 1623.51 ml   Filed Weights   05/02/24 0600 05/03/24 0500 05/04/24 0500  Weight: 98.9 kg 102.3 kg 102.4 kg   Physical Examination:  General: Ill-appearing in bed Neuro: Comatose, on no sedation HEENT: ET tube in place, no icterus Cardiovascular: Irregular rhythm, borderline tachycardic Lungs: Pressure support 8/8 Abdomen: Nondistended     amiodarone  60 mg/hr (05/04/24 0800)   dexmedetomidine  (PRECEDEX ) IV infusion Stopped (05/01/24 0732)   feeding supplement (PIVOT 1.5 CAL) 1,000 mL (05/04/24 0923)   heparin  1,500 Units/hr (05/04/24 0800)   meropenem  (MERREM ) IV 1 g (05/04/24 0911)   norepinephrine  (LEVOPHED ) Adult infusion 8 mcg/min (05/04/24 0800)   prismasol  BGK 4/2.5 500 mL/hr at 05/02/24 0620   prismasol  BGK 4/2.5 2,000 mL/hr at 05/02/24 0710   prismasol  BGK 4/2.5 400 mL/hr at 05/02/24 9485  vasopressin  0.03 Units/min (05/04/24 1010)   Patient Lines/Drains/Airways Status     Active  Line/Drains/Airways     Name Placement date Placement time Site Days   PICC Triple Lumen 04/17/24 Right Brachial 40 cm 0 cm 04/17/24  2320  -- 17   Hemodialysis Catheter Right Internal jugular Triple lumen Temporary (Non-Tunneled) 04/28/24  1000  Internal jugular  6   Negative Pressure Wound Therapy Knee Anterior;Left 02/18/21  1708  --  1171   External Urinary Catheter 05/03/24  1212  --  1   Fecal Management System 45 mL 04/30/24  1205  -- 4   Airway 7.5 mm 04/21/24  1211  -- 13   Small Bore Feeding Tube 10 Fr. Left nare Marking at nare/corner of mouth 61 cm 04/18/24  1610  Left nare  16   Wound 04/21/24 1333 Surgical Closed Surgical Incision Hip Right 04/21/24  1333  Hip  13           Assessment and Plan:    Shock: Presumably related to fluid removal, hypovolemia although at risk of infection -- MAP goal greater than 65, remains on significant vasopressor doses, active titration of IV vasoactive infusions --Continue meropenem  as below, stopped linezolid  1/10 with improved Bps after stopping CRRT/UF -- Foley removed 1/9 -- Continue stress dose steroids, wean off 1/11 --CRRT has been stopped, no further fluid removal at this time --Increased midodrine  to 15 mg 3 times daily per tube 1/10   Metabolic versus toxic encephalopathy: Head imaging unchanged.  Now off sedatives for more days.  Has been off long-term opiate drip for several days.  At risk for toxic encephalopathy from sedation but this risk is essentially diminished to 0 now with significant time off sedation and status post CRRT.  Most likely this is metabolic encephalopathy due to critical illness. --Dilaudid  removed from as needed medication, scheduled low-dose oxycodone  stopped 1/9 --Assess for improvement, unfortunately has worsened despite CRRT, uremia unlikely to be contributing   Acute hypoxemic respiratory failure: Preceding intubation, remains intubated after procedure 12/29.   -- PRVC, VAP bundle, stress ulcer  prophylaxis with pressure support trials -- Antibiotics as above and status post aggressive removal via CRRT --Encephalopathy precludes extubation   Pulmonary edema: --Chest x-ray improved after fluid removal via CRRT   Hospital-acquired pneumonia: Multiple organisms on culture. --Continue meropenem    AKI on CKD: Creatinine has wax and waned, placed on CRRT with significant volume overload -- CRRT discontinued 1/9, assess renal recovery -- Resumption of dialysis is futile in my opinion and given it caused worsening hypotension it is counterproductive to renal recovery -- Discussed again with the nephrology, Lasix  challenge, appreciate their assistance   AFIB w/ RVR:  -- Continue amiodarone , heparin  for stroke prophylaxis  Discussed with wife and son at bedside.  No improvement over time.  I think at most he has days left to live and that is on full life support including vasopressors and ventilator.  Certainly time is much less if life-sustaining measures were to be removed.  Unfortunately, we have done all we can and there are no more interventions that can be added to make things better.  It is out of her hands.   CRITICAL CARE Performed by: Donnice SAUNDERS Saintclair Schroader   Total critical care time: 42 minutes  Critical care time was exclusive of separately billable procedures and treating other patients.  Critical care was necessary to treat or prevent imminent or life-threatening deterioration.  Critical care was time spent personally by me  on the following activities: development of treatment plan with patient and/or surrogate as well as nursing, discussions with consultants, evaluation of patient's response to treatment, examination of patient, obtaining history from patient or surrogate, ordering and performing treatments and interventions, ordering and review of laboratory studies, ordering and review of radiographic studies, pulse oximetry and re-evaluation of patient's  condition.   Donnice JONELLE Beals, MD  See Tracey for personal pager PCCM on call pager [phone] until 7pm. Please call Elink 7p-7a. [phone]  05/04/2024 10:51 AM     "

## 2024-05-04 NOTE — Progress Notes (Signed)
 [facility] Progress Note   Assessment/ Plan:   Assessment/Plan  AKI and volume overload: - making urine but not enough to make headway with volume unloading - time- limited trial started CRR 04/28/24--> stopped CRRT 05/02/24 - pressors are up.  O2 requirement has improved but mentation has not - appreciate palliative care - Lasix  challenge today again - dialytic intervention in this pt carries sig risks of harms and has not conferred ability for pt to be extubated/ improve mentation, which were our two goals of therapy.  It has not improved his ability to leave ICU and carries sig risk of harms, specifically hemodynamic instability which can make the recovery process even longer (or carry catastrophic outcomes, including death).  We are not offering a re-trial of CRRT or dialytic intervention    2.  Critical polytrauma             - after falling off deck on christmas             - per trauma team   3.  Afib with RVR             - hopefully volume unloading will improve this             - on amio   4.  Acute hypoxic RF:             - volume mediates some             - FiO2 down, PS today  5.  Acute R DVT  - on systemic gtt now   5.  Dispo:             - In ICU  Subjective:    Labs worse, still on 2 pressors, not waking up despite off all sedating meds.  Had a discussion with pt's wife, son with PCCM and myself.  Discussed MSOF and that he is likely approaching EOL.  Discussed that re-initiating CRRT is a huge step backward and confers additional harms (mostly them being hemodynamic--> pressor requirements increased greatly on it).  Will medically treat electrolyte derangements but will not offer CRRT.  IHD carries even more risk of hemodynamic instability.  Discussed that body is shutting down.  Pt's son and wife are of course upset to hear this news.  They do acknowledge that the kidneys are not working and this is a big issue.  They also express their understanding  that dialysis does not fix the kidneys- physiologic processes within the patient must improve for this to happen.  They do request that we repeat the Lasix  challenge today- ordered.     Objective:   BP 97/62   Pulse 94   Temp 99.7 F (37.6 C) (Axillary)   Resp (!) 24   Ht 5' 11 (1.803 m)   Wt 102.4 kg   SpO2 98%   BMI 31.49 kg/m   Intake/Output Summary (Last 24 hours) at 05/04/2024 1132 Last data filed at 05/04/2024 9088 Gross per 24 hour  Intake 2508.92 ml  Output 1000 ml  Net 1508.92 ml   Weight change: 0.1 kg  Physical Exam: Gen ill appearing elderly gentleman Heent ETT in place Neck R nontunneled HD cath PULM clear today CV RRR ABD soft EXT 4 limb anasarca-- much improved, scrotal edema much improved NEURO intubated, sedated ACCESS R internal jugular nontunneled HD cath  Imaging: No results found.   Labs: BMET Recent Labs  Lab 05/01/24 0500 05/01/24 0817 05/01/24 1525 05/02/24 0535 05/02/24  1525 05/03/24 0537 05/03/24 1645 05/04/24 0546  NA 132*  134* 135 137 137 128* 134* 131* 134*  K 4.2  4.3 4.4 4.9 4.5 4.6 5.0 5.1 5.3*  CL 96*  97*  --  100 100 89* 95* 91* 95*  CO2 21*  22  --  24 24 20* 23 18* 17*  GLUCOSE 390*  349*  --  179* 192* 557* 181* 378* 245*  BUN 47*  48*  --  54* 51* 59* 99* 110* 146*  CREATININE 1.26*  1.28*  --  1.55* 1.44* 1.79* 2.62* 3.04* 3.99*  CALCIUM  7.9*  8.2*  --  9.3 9.1 8.3* 9.2 8.5* 9.1  PHOS 2.7  2.9  --  3.8 3.7 4.8*  4.8* 6.3* 6.7* 7.7*  7.8*   CBC Recent Labs  Lab 05/01/24 0500 05/01/24 0817 05/02/24 0535 05/03/24 0537 05/04/24 0546  WBC 18.4*  --  21.0* 25.2* 27.3*  HGB 8.7* 10.2* 9.4* 8.7* 8.2*  HCT 28.5* 30.0* 31.5* 29.1* 28.1*  MCV 105.6*  --  106.8* 106.2* 108.1*  PLT 276  --  297 284 271    Medications:     sodium chloride    Intravenous Once   acetaminophen  (TYLENOL ) oral liquid 160 mg/5 mL  650 mg Per Tube Q6H   amiodarone   150 mg Intravenous Once   artificial tears   Both Eyes QID    Chlorhexidine  Gluconate Cloth  6 each Topical Q0600   feeding supplement (PROSource TF20)  60 mL Per Tube TID   hydrocortisone  sod succinate (SOLU-CORTEF ) inj  100 mg Intravenous Q12H   insulin  aspart  0-15 Units Subcutaneous Q4H   insulin  aspart  6 Units Subcutaneous Q4H   insulin  glargine-yfgn  10 Units Subcutaneous BID   levothyroxine   50 mcg Per Tube QAC breakfast   midodrine   15 mg Per Tube TID WC   mouth rinse  15 mL Mouth Rinse Q2H   pantoprazole  (PROTONIX ) IV  40 mg Intravenous Q24H   sodium chloride  flush  10-40 mL Intracatheter Q12H   sodium zirconium cyclosilicate   10 g Per Tube BID    Almarie Bonine, MD 05/04/2024, 11:32 AM

## 2024-05-04 NOTE — IPAL (Signed)
" °  Interdisciplinary Goals of Care Family Meeting   Date carried out: 05/04/2024  Location of the meeting: Bedside  Member's involved: Physician, Bedside Registered Nurse, and Family Member or next of kin  Durable Power of Attorney or acting medical decision maker: Wife, son also present  Discussion: We discussed goals of care for Tom Norton .  Severe trauma.  Progressive multisystem organ failure.  Failed CRRT.  Progressive renal failure.  Progressive encephalopathy not improving despite aggressive efforts.  Remains on the ventilator.  Worsening vasopressor requirement throughout the day.  Discussed this time was not long.  Even with full support he is worsening.  Recommend DNR so we do not harm him at the end especially since resuscitative efforts on full support will be unsuccessful given his trajectory.  Wife and son in agreement with DNR.  Stating that is what patient would want.  Code status:   Code Status: Limited: Do not attempt resuscitation (DNR) -DNR-LIMITED -Do Not Intubate/DNI    Disposition: Continue current acute care  Time spent for the meeting: 8 minutes    Tom JONELLE Beals, MD  05/04/2024, 4:31 PM   "

## 2024-05-04 NOTE — Progress Notes (Signed)
 "  Palliative Medicine Inpatient Follow Up Note   HPI: 89 year old man admitted as polytrauma after fall from deck with multiple orthopedic injuries, fracture.  Past medical history of hyperlipidemia, hypertension, osteoarthritis, A-fib on Eliquis .  Sustained SDH, SAH, and right femur fracture secondary to fall from balcony.  Patient remains intubated since 04/21/2024.  Status post ORIF for femur.  CRRT initiated on 04/28/2024 for AKI, discontinued 05/02/2024.  Patient on vasopressors to maintain hemodynamic stability.  Significant Hospital events: 12/25 admitted after fall 12/29 OR for femur fracture repair 12/30 PCCM consulted, A-fib RVR overnight 1 =/5 CRRT started 1/9 CRRT stopped 1/10 Lasix  trial with no improvement 1/11 Lasix  retrial  This is hospital day #: 17  Today's Discussion 05/04/2024    I have reviewed medical records including:  EPIC notes: Reviewed critical care MD note from 05/04/2024: Currently being treated mainly for shock, metabolic versus toxic encephalopathy, and acute hypoxemic respiratory failure, AKI on CKD. Remains anuric, remains in significant vasopressor doses, dialysis and CRRT counterproductive to renal recovery given worsening hypotension. Reviewed nephrology MD progress note from 05/04/2024: Plan for Lasix  challenge today again.  Also noted that dialytic intervention carries significant risk of harms and is not conferred ability for patient to be extubated/improve mentation, which were to goals of therapy.  Nephrology will not be offering retrial of CRRT or dialytic intervention at this time.  Reviewed to track clinical course and prognostication.   MAR: Patient continues to receive 2 vasopressors (levophed  and vasopressin ).Reviewed PRN meds received over the last 24 hours. Reviewed to assess needs for medication adjustment to optimize comfort.  Available advanced directives in ACP: MOST form and living will available on file.  Labs: Lab results from 05/04/2024:  Hyperkalemia 5.3, creatinine elevated 3.99 eGFR below 13, worsening leukocytosis 27.3, hemoglobin low at 8.2 with hematocrit of 28.1, glucose elevated at 245mg /dl.  Reviewed to assist with prognostication.   Visited patient at bedside.  Family (wife and son) were present. RN present during my visit.  Patient is critically ill, and currently intubated, mechanically ventilated, sedated and unresponsive.  Unable to obtain subjective history due to altered level of consciousness and sedation.  Clinical course notable for ongoing critical illness requiring continuous ventilatory and hemodynamic support.  The patient's wife was present at bedside and expressed hopefulness for recovery.  She shared brief life memories, noting that they met in college and have been married for about 67 years.  I introduced myself to the patient's wife and son, Tom Norton, and explained the role of the palliative medicine team and providing support and addressing questions or concerns.  We discussed the patient's current clinical status.  The family voiced concern regarding his renal function and ask about the possibility of restarting CRRT, stating that they would like to pursue this if it were an option.  I explained that this may be unlikely but nephrology would be best suited to address the feasibility and risk of further renal replacement therapy.  I attempted to revisit goals of care discussions, however the family was not ready at this time and preferred to first speak with nephrology regarding potential next interventions.  They acknowledged the patient's significant disease burden, including involvement of multiple organ systems, and recognized the prolonged ICU course of 17 days.  Shortly thereafter, I discussed the patient's case with Dr.Upton and Dr. Annella.  We reviewed the patient's overall prognosis is very poor, the maximal medical interventions have already been pursued, and that restarting CRRT or other dialysis  interventions is not  an option given the significant risks.  The patient may be approaching end-of-life, though the family does not yet appear ready to accept this.   Goals of care at this time remains clear. Family would like to maintain full code and full scope of treatment until they hear definitive input from nephrology.   Met with patient/family to attempt discuss diagnosis prognosis, GOC, EOL wishes, disposition and options.  Created space and opportunity for family to explore thoughts feelings and fears regarding current medical situation.  Family face treatment option decisions, advanced directive decisions and anticipatory care needs.   Discussed the importance of continued conversation with family and their  medical providers regarding overall plan of care and treatment options, ensuring decisions are within the context of the patients values and GOCs. The patient, family, and medical team were encouraged to revisit these discussions regularly, as preferences and circumstances may change over time.  Questions and concerns addressed   Palliative Support Provided.   Coordinated plan of care with bedside RN. RN reports patient with no clinical improvement. 2 pressors ongoing. Failed to respond to lasix  challenge yesterday.   Objective Assessment: Vital Signs Vitals:   05/04/24 1117 05/04/24 1153  BP:    Pulse:    Resp:    Temp:  99.9 F (37.7 C)  SpO2: 98%     Intake/Output Summary (Last 24 hours) at 05/04/2024 1154 Last data filed at 05/04/2024 0911 Gross per 24 hour  Intake 2508.92 ml  Output 1000 ml  Net 1508.92 ml   Last Weight  Most recent update: 05/04/2024  5:44 AM    Weight  102.4 kg (225 lb 12 oz)             Physical Exam Vitals and nursing note reviewed.  Constitutional:      Appearance: He is toxic-appearing.  HENT:     Head: Normocephalic and atraumatic.     Nose:     Comments: Cortrak to left nare    Mouth/Throat:     Mouth: Mucous membranes  are moist.  Pulmonary:     Comments: Intubated, on to mechanical ventilatory support.  Abdominal:     General: Abdomen is flat. Bowel sounds are normal.     Palpations: Abdomen is soft.  Genitourinary:    Comments: External urinary catheter Flexi seal in place  Musculoskeletal:     Comments: Bedridden  Skin:    General: Skin is warm.  Neurological:     Mental Status: He is alert.     Comments: Unresponsive     SUMMARY OF RECOMMENDATIONS    # Complex medical decision-making/goals of care  Code Status: Continue with full code, full scope Discussed with family 05/04/2024 patient's current medical state, they are not quite ready for another GOC conversation.  CRRT stopped 05/02/2024, lasix  challenge yesterday with no renal recovery, plan for another Lasix  challenge today. Per Nephrology, re-starting CRRT is not a viable option, and not a candidate for dialytic interventions given significant risks.  Given significant disease burden and no clinical improvement with patient being in the ICU for 17 days now, requiring continued pressors, ongoing encephalopathy, intubated on mech vent, he may very likely be approaching end-of-life. May greatly benefit for another GOC conversation with family when there are ready.  Palliative medicine team will continue to follow.   # Symptom management Per Primary team Palliative medicine is available to assist as needed.    # Psychosocial support Continue to provide psycho-social and emotional support to patient and family.  #  Discharge planning  To be determined.  # Prognosis  Prognosis is poor given patient's high disease burden and high risk for decompensation.    # Treatment plan discussed with: Patient's family (wife Slater and son Tom Norton), nursing staff, Nephrology MD Dr. Gearline, and ICU MD Dr. Annella.    I personally spent a total of 50 minutes in the care of the patient today including preparing to see the patient, getting/reviewing  separately obtained history, performing a medically appropriate exam/evaluation, referring and communicating with other health care professionals, documenting clinical information in the EHR, and coordinating care.   Thank you for allowing us  to participate in the care of Tom Norton   ______________________________________________________________________________________ Kathlyne Bolder NP-C [place] Palliative Medicine Team Team Cell Phone: [phone] Please utilize secure chat with additional questions, if there is no response within 30 minutes please call the above phone number  Palliative Medicine Team providers are available by phone from 7am to 7pm daily and can be reached through the team cell phone.  Should this patient require assistance outside of these hours, please call the patient's attending physician.     "

## 2024-05-05 DIAGNOSIS — R579 Shock, unspecified: Secondary | ICD-10-CM | POA: Diagnosis not present

## 2024-05-05 DIAGNOSIS — N179 Acute kidney failure, unspecified: Secondary | ICD-10-CM | POA: Diagnosis not present

## 2024-05-05 DIAGNOSIS — T07XXXA Unspecified multiple injuries, initial encounter: Secondary | ICD-10-CM | POA: Diagnosis not present

## 2024-05-05 DIAGNOSIS — N189 Chronic kidney disease, unspecified: Secondary | ICD-10-CM | POA: Diagnosis not present

## 2024-05-05 LAB — RENAL FUNCTION PANEL
Albumin: 3.3 g/dL — ABNORMAL LOW (ref 3.5–5.0)
Anion gap: 26 — ABNORMAL HIGH (ref 5–15)
BUN: 172 mg/dL — ABNORMAL HIGH (ref 8–23)
CO2: 16 mmol/L — ABNORMAL LOW (ref 22–32)
Calcium: 9.3 mg/dL (ref 8.9–10.3)
Chloride: 93 mmol/L — ABNORMAL LOW (ref 98–111)
Creatinine, Ser: 5.07 mg/dL — ABNORMAL HIGH (ref 0.61–1.24)
GFR, Estimated: 10 mL/min — ABNORMAL LOW
Glucose, Bld: 161 mg/dL — ABNORMAL HIGH (ref 70–99)
Phosphorus: 9.2 mg/dL — ABNORMAL HIGH (ref 2.5–4.6)
Potassium: 5.1 mmol/L (ref 3.5–5.1)
Sodium: 134 mmol/L — ABNORMAL LOW (ref 135–145)

## 2024-05-05 LAB — GLUCOSE, CAPILLARY
Glucose-Capillary: 164 mg/dL — ABNORMAL HIGH (ref 70–99)
Glucose-Capillary: 136 mg/dL — ABNORMAL HIGH (ref 70–99)

## 2024-05-05 LAB — TRIGLYCERIDES: Triglycerides: 94 mg/dL

## 2024-05-05 LAB — HEPARIN LEVEL (UNFRACTIONATED): Heparin Unfractionated: 0.38 [IU]/mL (ref 0.30–0.70)

## 2024-05-05 LAB — PHOSPHORUS: Phosphorus: 9.3 mg/dL — ABNORMAL HIGH (ref 2.5–4.6)

## 2024-05-05 LAB — MAGNESIUM: Magnesium: 3 mg/dL — ABNORMAL HIGH (ref 1.7–2.4)

## 2024-05-05 MED ORDER — GLYCOPYRROLATE 0.2 MG/ML IJ SOLN: 0.2000 mg | INTRAMUSCULAR | Status: DC | PRN

## 2024-05-05 MED ORDER — ACETAMINOPHEN 325 MG PO TABS: 650.0000 mg | ORAL_TABLET | Freq: Four times a day (QID) | ORAL | Status: DC | PRN

## 2024-05-05 MED ORDER — LORAZEPAM 2 MG/ML IJ SOLN: 2.0000 mg | INTRAMUSCULAR | Status: DC | PRN

## 2024-05-05 MED ORDER — GLYCOPYRROLATE 1 MG PO TABS: 1.0000 mg | ORAL_TABLET | ORAL | Status: DC | PRN

## 2024-05-05 MED ORDER — FENTANYL 2500MCG IN NS 250ML (10MCG/ML) PREMIX INFUSION
0.0000 ug/h | INTRAVENOUS | Status: DC
  Administered 2024-05-05: 200 ug/h via INTRAVENOUS
  Filled 2024-05-05: qty 250

## 2024-05-05 MED ORDER — ACETAMINOPHEN 650 MG RE SUPP: 650.0000 mg | Freq: Four times a day (QID) | RECTAL | Status: DC | PRN

## 2024-05-05 MED ORDER — SODIUM CHLORIDE 0.9 % IV SOLN: INTRAVENOUS | Status: DC

## 2024-05-05 MED ORDER — FENTANYL BOLUS VIA INFUSION: 100.0000 ug | INTRAVENOUS | Status: DC | PRN

## 2024-05-05 MED ORDER — POLYVINYL ALCOHOL 1.4 % OP SOLN: 1.0000 [drp] | Freq: Four times a day (QID) | OPHTHALMIC | Status: DC | PRN

## 2024-05-25 NOTE — Progress Notes (Signed)
 PHARMACY - ANTICOAGULATION CONSULT NOTE  Pharmacy Consult for heparin  gtt Indication: VTE treatment  Allergies[1]  Patient Measurements: Height: 5' 11 (180.3 cm) Weight: 101.1 kg (222 lb 14.2 oz) IBW/kg (Calculated) : 75.3 Heparin  dosing weight 99.5 kg  Vital Signs: Temp: 100.6 F (38.1 C) 05-16-2024 0400) Temp Source: Axillary 2024-05-16 0400) BP: 108/57 May 16, 2024 0600) Pulse Rate: 95 05-16-24 0600)  Labs: Recent Labs    05/03/24 0537 05/03/24 1645 05/04/24 0546 05-16-24 0407  HGB 8.7*  --  8.2*  --   HCT 29.1*  --  28.1*  --   PLT 284  --  271  --   HEPARINUNFRC 0.34  --  0.37 0.38  CREATININE 2.62* 3.04* 3.99* 5.07*    Estimated Creatinine Clearance: 11 mL/min (A) (by C-G formula based on SCr of 5.07 mg/dL (H)).   Assessment: 89 yo M polytrauma including bilateral SDH and scattered SAH. Pt was found to have acute DVT in R and L legs. Notably, there is also large hematoma in groin throughout thigh. Last CTH 1/8 stable SDH/SAH with no new or enlarging intracranial hemorrhage. Pharmacy consulted for heparin  gtt for VTE treatment.  Low goal, no bolus.  HL 0.38- therapeutic Hgb 8.2 Plt 271 1/11  Goal of Therapy:  Heparin  level 0.3-0.5 units/ml Monitor platelets by anticoagulation protocol: Yes   Plan:  Continue heparin  gtt at 1500 units/hr  Daily Heparin  Level Daily HL, CBC  F/u s/sx bleeding and longterm anticoag plan  Rankin Sams, PharmD, BCCCP Clinical Pharmacist    [1]  Allergies Allergen Reactions   Bee Venom Anaphylaxis   Doxycycline  Hyclate Anaphylaxis   Yellow Jacket Venom Anaphylaxis   Atorvastatin Other (See Comments)   Doxycycline  Hyclate     Other Reaction(s): tachycardia/afib   Finasteride     Other Reaction(s): weak urinary stream   Sulfamethoxazole     Other Reaction(s): rash/tachycardia/hypotension   Bactrim [Sulfamethoxazole-Trimethoprim] Hives and Rash   Rivaroxaban  Rash

## 2024-05-25 NOTE — Progress Notes (Signed)
 Patient was extubated and peacefully passed at 41 with Wife and Son at bedside.  Karleen RN and Merilee RN confirmed TOD through auscultation.  fentanyl  IV wasted with RN Richardson Said at bedside.

## 2024-05-25 NOTE — Progress Notes (Signed)
 Patient ID: Tom Norton, male   DOB: Feb 21, 1930, 89 y.o.   MRN: [id number] Follow up - Trauma Critical Care   Patient Details:    Tom Norton is an 89 y.o. male.  Lines/tubes : Airway 7.5 mm (Active)  Secured at (cm) 24 cm 05-11-2024 0737  Measured From Lips 05-11-2024 0737  Secured Location Right May 11, 2024 0737  Secured By [employer] 11-May-2024 0737  Bite Block No 05/11/2024 0737  Tube Holder Repositioned Yes 05-11-24 0737  Prone position No 2024-05-11 0737  Cuff Pressure (cm H2O) Green OR 18-26 [facility] 11-May-2024 0737  Site Condition Dry 05/11/24 0737     PICC Triple Lumen 04/17/24 Right Brachial 40 cm 0 cm (Active)  Indication for Insertion or Continuance of Line Vasoactive infusions 05/04/24 2000  Exposed Catheter (cm) 0 cm 04/17/24 2326  Site Assessment Clean, Dry, Intact 05/04/24 2000  Lumen #1 Status Infusing;Flushed 05/04/24 2000  Lumen #2 Status Infusing;Flushed 05/04/24 2000  Lumen #3 Status Infusing;Flushed;In-line blood sampling system in place 05/04/24 2000  Dressing Type Transparent 05/04/24 2000  Dressing Status Antimicrobial disc/dressing in place 05/04/24 2000  Line Care Connections checked and tightened 05/04/24 0800  Line Adjustment (NICU/IV Team Only) No 04/21/24 0815  Dressing Intervention New dressing 05/02/24 1400  Dressing Change Due 05/09/24 05/04/24 2000     Negative Pressure Wound Therapy Knee Anterior;Left (Active)     Fecal Management System 45 mL (Active)  Does patient meet criteria for removal? No 05/04/24 2000  Daily care Skin around tube assessed;Skin barrier applied to rectal area;Assess location of position indicator line;Bag changed (every 24 hours) 05/04/24 2000  Patient Indicator Assessment Green 05/04/24 0800  Amount in bulb 30 mL 05/01/24 0558  Output (mL) 400 mL May 11, 2024 0405  Intake (mL) 80 mL 05/01/24 0558    Microbiology/Sepsis markers: Results for orders placed or performed during the hospital encounter of 04/17/24  MRSA  Next Gen by PCR, Nasal     Status: None   Collection Time: 04/17/24  8:06 PM   Specimen: Nasal Mucosa; Nasal Swab  Result Value Ref Range Status   MRSA by PCR Next Gen NOT DETECTED NOT DETECTED Final    Comment: (NOTE) The GeneXpert MRSA Assay (FDA approved for NASAL specimens only), is one component of a comprehensive MRSA colonization surveillance program. It is not intended to diagnose MRSA infection nor to guide or monitor treatment for MRSA infections. Test performance is not FDA approved in patients less than 89 years old. Performed at [facility], [address] [place], [place] [address]   Culture, Respiratory w Gram Stain     Status: None   Collection Time: 04/22/24  1:08 PM   Specimen: Tracheal Aspirate; Respiratory  Result Value Ref Range Status   Specimen Description TRACHEAL ASPIRATE  Final   Special Requests NONE  Final   Gram Stain   Final    FEW WBC PRESENT,BOTH PMN AND MONONUCLEAR MODERATE GRAM NEGATIVE RODS FEW GRAM POSITIVE COCCI RARE GRAM POSITIVE RODS Performed at [facility], [address], [place], [place] [address]    Culture MODERATE KLEBSIELLA AEROGENES  Final   Report Status 04/24/2024 FINAL  Final   Organism ID, Bacteria KLEBSIELLA AEROGENES  Final      Susceptibility   Klebsiella aerogenes - MIC*    CEFEPIME  <=0.12 SENSITIVE Sensitive     CEFTRIAXONE 32 RESISTANT Resistant     CIPROFLOXACIN <=0.06 SENSITIVE Sensitive     GENTAMICIN <=1 SENSITIVE Sensitive  MEROPENEM  <=0.25 SENSITIVE Sensitive     TRIMETH/SULFA <=20 SENSITIVE Sensitive     PIP/TAZO Value in next row Intermediate      64 INTERMEDIATEThis is a modified FDA-approved test that has been validated and its performance characteristics determined by the reporting laboratory.  This laboratory is certified under the Clinical Laboratory Improvement Amendments CLIA as qualified to perform high complexity clinical laboratory testing.    * MODERATE KLEBSIELLA AEROGENES   Culture, Respiratory w Gram Stain     Status: None   Collection Time: 04/28/24  9:02 AM   Specimen: Tracheal Aspirate; Respiratory  Result Value Ref Range Status   Specimen Description TRACHEAL ASPIRATE  Final   Special Requests NONE  Final   Gram Stain   Final    RARE WBC PRESENT, PREDOMINANTLY PMN FEW GRAM VARIABLE ROD RARE GRAM POSITIVE COCCI IN PAIRS Performed at [facility], [address], [place], [place] [address]    Culture   Final    RARE KLEBSIELLA AEROGENES RARE MORGANELLA MORGANII    Report Status 05/01/2024 FINAL  Final   Organism ID, Bacteria KLEBSIELLA AEROGENES  Final   Organism ID, Bacteria MORGANELLA MORGANII  Final      Susceptibility   Klebsiella aerogenes - MIC*    CEFEPIME  <=0.12 SENSITIVE Sensitive     CEFTRIAXONE 32 RESISTANT Resistant     CIPROFLOXACIN <=0.06 SENSITIVE Sensitive     GENTAMICIN <=1 SENSITIVE Sensitive     MEROPENEM  <=0.25 SENSITIVE Sensitive     TRIMETH/SULFA <=20 SENSITIVE Sensitive     PIP/TAZO Value in next row Resistant      >=128 RESISTANTThis is a modified FDA-approved test that has been validated and its performance characteristics determined by the reporting laboratory.  This laboratory is certified under the Clinical Laboratory Improvement Amendments CLIA as qualified to perform high complexity clinical laboratory testing.    * RARE KLEBSIELLA AEROGENES   Morganella morganii - MIC*    AMPICILLIN Value in next row Resistant      >=128 RESISTANTThis is a modified FDA-approved test that has been validated and its performance characteristics determined by the reporting laboratory.  This laboratory is certified under the Clinical Laboratory Improvement Amendments CLIA as qualified to perform high complexity clinical laboratory testing.    ERTAPENEM Value in next row Sensitive      >=128 RESISTANTThis is a modified FDA-approved test that has been validated and its performance characteristics determined by the reporting  laboratory.  This laboratory is certified under the Clinical Laboratory Improvement Amendments CLIA as qualified to perform high complexity clinical laboratory testing.    CIPROFLOXACIN Value in next row Sensitive      >=128 RESISTANTThis is a modified FDA-approved test that has been validated and its performance characteristics determined by the reporting laboratory.  This laboratory is certified under the Clinical Laboratory Improvement Amendments CLIA as qualified to perform high complexity clinical laboratory testing.    GENTAMICIN Value in next row Sensitive      >=128 RESISTANTThis is a modified FDA-approved test that has been validated and its performance characteristics determined by the reporting laboratory.  This laboratory is certified under the Clinical Laboratory Improvement Amendments CLIA as qualified to perform high complexity clinical laboratory testing.    MEROPENEM  Value in next row Sensitive      >=128 RESISTANTThis is a modified FDA-approved test that has been validated and its performance characteristics determined by the reporting laboratory.  This laboratory is certified under the Clinical Laboratory Improvement Amendments CLIA as  qualified to perform high complexity clinical laboratory testing.    TRIMETH/SULFA Value in next row Sensitive      >=128 RESISTANTThis is a modified FDA-approved test that has been validated and its performance characteristics determined by the reporting laboratory.  This laboratory is certified under the Clinical Laboratory Improvement Amendments CLIA as qualified to perform high complexity clinical laboratory testing.    AMPICILLIN/SULBACTAM Value in next row Resistant      >=128 RESISTANTThis is a modified FDA-approved test that has been validated and its performance characteristics determined by the reporting laboratory.  This laboratory is certified under the Clinical Laboratory Improvement Amendments CLIA as qualified to perform high complexity  clinical laboratory testing.    PIP/TAZO Value in next row Sensitive      <=4 SENSITIVEThis is a modified FDA-approved test that has been validated and its performance characteristics determined by the reporting laboratory.  This laboratory is certified under the Clinical Laboratory Improvement Amendments CLIA as qualified to perform high complexity clinical laboratory testing.    * RARE MORGANELLA MORGANII  Respiratory (~20 pathogens) panel by PCR     Status: None   Collection Time: 04/28/24  9:52 AM   Specimen: Nasopharyngeal Swab; Respiratory  Result Value Ref Range Status   Adenovirus NOT DETECTED NOT DETECTED Final   Coronavirus 229E NOT DETECTED NOT DETECTED Final    Comment: (NOTE) The Coronavirus on the Respiratory Panel, DOES NOT test for the novel  Coronavirus (2019 nCoV)    Coronavirus HKU1 NOT DETECTED NOT DETECTED Final   Coronavirus NL63 NOT DETECTED NOT DETECTED Final   Coronavirus OC43 NOT DETECTED NOT DETECTED Final   Metapneumovirus NOT DETECTED NOT DETECTED Final   Rhinovirus / Enterovirus NOT DETECTED NOT DETECTED Final   Influenza A NOT DETECTED NOT DETECTED Final   Influenza B NOT DETECTED NOT DETECTED Final   Parainfluenza Virus 1 NOT DETECTED NOT DETECTED Final   Parainfluenza Virus 2 NOT DETECTED NOT DETECTED Final   Parainfluenza Virus 3 NOT DETECTED NOT DETECTED Final   Parainfluenza Virus 4 NOT DETECTED NOT DETECTED Final   Respiratory Syncytial Virus NOT DETECTED NOT DETECTED Final   Bordetella pertussis NOT DETECTED NOT DETECTED Final   Bordetella Parapertussis NOT DETECTED NOT DETECTED Final   Chlamydophila pneumoniae NOT DETECTED NOT DETECTED Final   Mycoplasma pneumoniae NOT DETECTED NOT DETECTED Final    Comment: Performed at [facility], [address] [place], [place] [address]    Anti-infectives:  Anti-infectives (From admission, onward)    Start     Dose/Rate Route Frequency Ordered Stop   05/02/24 2200  meropenem  (MERREM ) 1 g in  sodium chloride  0.9 % 100 mL IVPB        1 g 200 mL/hr over 30 Minutes Intravenous Every 12 hours 05/02/24 1136 05/07/24 2359   05/02/24 1100  linezolid  (ZYVOX ) IVPB 600 mg  Status:  Discontinued        600 mg 300 mL/hr over 60 Minutes Intravenous Every 12 hours 05/02/24 1006 05/03/24 0734   04/29/24 1000  vancomycin  (VANCOREADY) IVPB 1250 mg/250 mL  Status:  Discontinued        1,250 mg 166.7 mL/hr over 90 Minutes Intravenous Every 24 hours 04/28/24 1053 05/01/24 1059   04/28/24 1700  meropenem  (MERREM ) 1 g in sodium chloride  0.9 % 100 mL IVPB  Status:  Discontinued        1 g 200 mL/hr over 30 Minutes Intravenous Every 8 hours 04/28/24 1052 05/02/24 1136   04/28/24  1000  meropenem  (MERREM ) 1 g in sodium chloride  0.9 % 100 mL IVPB  Status:  Discontinued        1 g 200 mL/hr over 30 Minutes Intravenous Every 12 hours 04/28/24 0827 04/28/24 1052   04/28/24 0915  vancomycin  (VANCOREADY) IVPB 2000 mg/400 mL        2,000 mg 200 mL/hr over 120 Minutes Intravenous  Once 04/28/24 0827 04/28/24 1149   04/22/24 1115  ceFEPIme  (MAXIPIME ) 2 g in sodium chloride  0.9 % 100 mL IVPB  Status:  Discontinued        2 g 200 mL/hr over 30 Minutes Intravenous Every 12 hours 04/22/24 1024 04/28/24 0827   04/20/24 2045  ceFAZolin  (ANCEF ) IVPB 2g/100 mL premix       Note to Pharmacy: Anesthesia to give preop   2 g 200 mL/hr over 30 Minutes Intravenous  Once 04/20/24 1948 04/20/24 2053     Consults: Treatment Team:  Gearline Norris, MD    Studies:    Events:  Subjective:    Overnight Issues: noted family discussions/decisions  Objective:  Vital signs for last 24 hours: Temp:  [99.6 F (37.6 C)-100.6 F (38.1 C)] 100.6 F (38.1 C) 05-24-2024 0400) Pulse Rate:  [77-113] 99 05-24-24 0800) Resp:  [15-32] 21 May 24, 2024 0800) BP: (87-120)/(42-67) 108/62 May 24, 2024 0800) SpO2:  [90 %-100 %] 91 % May 24, 2024 0800) FiO2 (%):  [40 %] 40 % 2024-05-24 0737) Weight:  [101.1 kg] 101.1 kg 2024/05/24 0406)  Hemodynamic  parameters for last 24 hours:    Intake/Output from previous day: 01/11 0701 - 24-May-2024 0700 In: 3190.7 [I.V.:1790.3; NG/GT:1140.4; IV Piggyback:260] Out: 2150 [Stool:2150]  Intake/Output this shift: Total I/O In: 74.2 [I.V.:74.2] Out: -   Vent settings for last 24 hours: Vent Mode: PRVC FiO2 (%):  [40 %] 40 % Set Rate:  [18 bmp] 18 bmp Vt Set:  [560 mL] 560 mL PEEP:  [8 cmH20] 8 cmH20 Pressure Support:  [8 cmH20] 8 cmH20 Plateau Pressure:  [16 cmH20-22 cmH20] 21 cmH20  Physical Exam:  General: on vent Neuro: +gag, breathes over vent. Otherwise no response to pain HEENT/Neck: ETT Resp: few rhonchi CVS: IRR GI: soft, NT Extremities: edema 2+  Results for orders placed or performed during the hospital encounter of 04/17/24 (from the past 24 hours)  Glucose, capillary     Status: Abnormal   Collection Time: 05/04/24 11:44 AM  Result Value Ref Range   Glucose-Capillary 157 (H) 70 - 99 mg/dL  Glucose, capillary     Status: Abnormal   Collection Time: 05/04/24  3:45 PM  Result Value Ref Range   Glucose-Capillary 186 (H) 70 - 99 mg/dL  Glucose, capillary     Status: Abnormal   Collection Time: 05/04/24  7:51 PM  Result Value Ref Range   Glucose-Capillary 162 (H) 70 - 99 mg/dL  Glucose, capillary     Status: Abnormal   Collection Time: 05/04/24 11:38 PM  Result Value Ref Range   Glucose-Capillary 163 (H) 70 - 99 mg/dL  Glucose, capillary     Status: Abnormal   Collection Time: May 24, 2024  3:39 AM  Result Value Ref Range   Glucose-Capillary 164 (H) 70 - 99 mg/dL  Phosphorus     Status: Abnormal   Collection Time: 05-24-24  4:07 AM  Result Value Ref Range   Phosphorus 9.3 (H) 2.5 - 4.6 mg/dL  Triglycerides     Status: None   Collection Time: 2024-05-24  4:07 AM  Result Value Ref Range   Triglycerides  94 <150 mg/dL  Renal function panel (daily at 0500)     Status: Abnormal   Collection Time: 2024/05/22  4:07 AM  Result Value Ref Range   Sodium 134 (L) 135 - 145 mmol/L    Potassium 5.1 3.5 - 5.1 mmol/L   Chloride 93 (L) 98 - 111 mmol/L   CO2 16 (L) 22 - 32 mmol/L   Glucose, Bld 161 (H) 70 - 99 mg/dL   BUN 827 (H) 8 - 23 mg/dL   Creatinine, Ser 4.92 (H) 0.61 - 1.24 mg/dL   Calcium  9.3 8.9 - 10.3 mg/dL   Phosphorus 9.2 (H) 2.5 - 4.6 mg/dL   Albumin  3.3 (L) 3.5 - 5.0 g/dL   GFR, Estimated 10 (L) >60 mL/min   Anion gap 26 (H) 5 - 15  Magnesium      Status: Abnormal   Collection Time: 05-22-24  4:07 AM  Result Value Ref Range   Magnesium  3.0 (H) 1.7 - 2.4 mg/dL  Heparin  level (unfractionated)     Status: None   Collection Time: 05-22-24  4:07 AM  Result Value Ref Range   Heparin  Unfractionated 0.38 0.30 - 0.70 IU/mL  Glucose, capillary     Status: Abnormal   Collection Time: 05/22/24  7:59 AM  Result Value Ref Range   Glucose-Capillary 136 (H) 70 - 99 mg/dL    Assessment & Plan: Present on Admission:  Critical polytrauma    LOS: 18 days   Additional comments:I reviewed the patient's new clinical lab test results. / Critical polytrauma S/P fall from deck     Additional comments:I reviewed the patient's new clinical lab test results. And CXR Injuries: - Right femur fracture - SDH/SAH - Nondepressed parietoocipital bone fracture - Trace left basilar pneumothorax - Bilateral rib fractures - Thoracic TP fractures - Possible T9-10 discoligamentous injury - Left psoas hemorrhage without extravasation   Plan: - Appreciate neurosurgery recommendations, Dr. Garst - MRI thoracic spine done 1/2 - NS plan TLSO once sitting or standing - Repeat [facility] 12/28 with stable ICH, but new occipital stroke, Dr. Merrianne consulted. MRI brain also done 1/2 - follow neuro exam - F/U CT H 1/9 slight improvement, no new findings - seddation held, not waking up yet   - Appreciate orthopaedics recommendations, Dr. Jerri, Dr. Kendal - ORIF right femur 12/29 (Dr. Jerri)   - Acute blood loss anemia    - Shock - hemorrhagic initially and now cardiogenic and worsening   -  acute hypoxic ventilator dependent respiratory failure    - ID - was on cefepime  for klebsiella PNA, re-CX 1/6 klebsialla and morganella, cont. Merrem . Resp panel neg.    - FEN - strict NPO, cortrak   - DVT - heparin  drip   - AKI and volume overload - CRRT, appreciate Dr. Tod help.    - Dispo - ICU Discussion was held by Dr. Annella and myself with his wife, son and other family members. They have decided to transition to comfort care. This is appropriate at this time. Appreciate CCM collaboration. Critical Care Total Time*: 33 Minutes  Dann Hummer, MD, MPH, FACS Trauma & General Surgery Use [email or web address] to contact on call provider  2024/05/22  *Care during the described time interval was provided by me. I have reviewed this patient's available data, including medical history, events of note, physical examination and test results as part of my evaluation.

## 2024-05-25 NOTE — Death Summary Note (Signed)
 " DEATH SUMMARY   Patient Details  Name: Tom Norton MRN: [id number] DOB: Apr 06, 1930  Admission/Discharge Information   Admit Date:  04/18/24  Date of Death: Date of Death: 05-06-2024  Time of Death: Time of Death: 0957  Length of Stay: 05/12/24  Referring Physician: Charlott Dorn LABOR, MD   Reason(s) for Hospitalization  Fall with traumatic brain injury, femur fracture, and multiple rib fractures  Diagnoses  Preliminary cause of death:  Secondary Diagnoses (including complications and co-morbidities):  Principal Problem:   Critical polytrauma Injuries: - Right femur fracture - SDH/SAH - Nondepressed parietoocipital bone fracture - Trace left basilar pneumothorax - Bilateral rib fractures - Thoracic TP fractures - Possible T9-10 discoligamentous injury - Left psoas hemorrhage without extravasation  Brief Hospital Course (including significant findings, care, treatment, and services provided and events leading to death)  Tom Norton is a 89 y.o. year old male who was on upper floor balcony cleaning leaves off of the roof when he fell and the balcony railing gave way and he fell from the second floor balcony to the first floor, approximately 10 feet. On EMS arrival patient had an obvious right leg deformity and GCS of 10. Patient takes Eliquis  for A-fib.  Trauma workup revealed the above injuries.  He underwent Open treatment of his intertrochanteric and subtrochanteric femur fracture on the right side by Dr. Jerri.  He was followed in the ICU but developed worsening mental status changes and respiratory failure.  He required intubation.  Follow-up imaging of his head also revealed strokes as well as evolving traumatic brain injury.  Despite ongoing aggressive care he gradually developed multisystem organ failure.  His family was very clear with pursuing all avenues of treatment.  CCM was consulted to assist in managing his respiratory failure and shock.  Pneumonia was treated  appropriately with antibiotics.  He developed severe fluid overload and required CRRT for removal.  Despite doing this his mental status did not improve and he could not wean from the ventilator and family finally decided to transition to comfort care.  He passed away peacefully.   Pertinent Labs and Studies  Significant Diagnostic Studies   Microbiology No results found for this or any previous visit (from the past 240 hours).  Lab Basic Metabolic Panel: Recent Labs  Lab 05/02/24 1525 05/03/24 0537 05/03/24 1645 05/04/24 0546 May 06, 2024 0407  NA 128* 134* 131* 134* 134*  K 4.6 5.0 5.1 5.3* 5.1  CL 89* 95* 91* 95* 93*  CO2 20* 23 18* 17* 16*  GLUCOSE 557* 181* 378* 245* 161*  BUN 59* 99* 110* 146* 172*  CREATININE 1.79* 2.62* 3.04* 3.99* 5.07*  CALCIUM  8.3* 9.2 8.5* 9.1 9.3  MG 2.4 2.8*  --  2.8* 3.0*  PHOS 4.8*  4.8* 6.3* 6.7* 7.7*  7.8* 9.2*  9.3*   Liver Function Tests: Recent Labs  Lab 05/02/24 0535 05/02/24 1525 05/03/24 1645 05/04/24 0546 05/06/24 0407  AST  --   --   --  35  --   ALT  --   --   --  30  --   ALKPHOS  --   --   --  131*  --   BILITOT  --   --   --  0.7  --   PROT  --   --   --  5.6*  --   ALBUMIN  3.4* 3.1* 3.0* 3.2*  3.2* 3.3*   No results for input(s): LIPASE, AMYLASE in the last 168 hours. No  results for input(s): AMMONIA in the last 168 hours. CBC: Recent Labs  Lab 05/02/24 0535 05/03/24 0537 05/04/24 0546  WBC 21.0* 25.2* 27.3*  HGB 9.4* 8.7* 8.2*  HCT 31.5* 29.1* 28.1*  MCV 106.8* 106.2* 108.1*  PLT 297 284 271   Cardiac Enzymes: No results for input(s): CKTOTAL, CKMB, CKMBINDEX, TROPONINI in the last 168 hours. Sepsis Labs: Recent Labs  Lab 05/02/24 0535 05/03/24 0537 05/04/24 0546  WBC 21.0* 25.2* 27.3*    Procedures/Operations  Open treatment of intertrochanteric and subtrochanteric right femur fracture by Dr. Jerri Dann FORBES Sebastian 05/08/2024, 4:25 PM   "

## 2024-05-25 NOTE — Progress Notes (Signed)
 RT NOTE: PT extubated to comfort care per order.

## 2024-05-25 NOTE — Progress Notes (Signed)
 "  NAME:  Tom Norton, MRN:  [id number], DOB:  06-08-1929, LOS: 18 ADMISSION DATE:  04/17/2024, CONSULTATION DATE: 04/22/2024 REFERRING MD:  Trauma, CHIEF COMPLAINT:  Shock   History of Present Illness:  89 year old man admitted as polytrauma after fall from deck with multiple orthopedic injuries, fracture, whom we are consulted for hypoxemia and shock.  Patient was admitted Christmas Day after fall from deck per report.  Initial images reveal left-sided rib fractures, right femur fracture, possible iliac fractures, left posterior subdural hematoma no midline shift, small left posterior parafalcine subdural hematoma, traumatic subarachnoid hemorrhage, skull fracture, trace left apical pneumothorax.  Chest x-ray 12/28 demonstrated left-sided atelectasis and small pleural effusion on my review interpretation.  He went for femur repair 12/29.  He named and remains intubated after procedure.  Chest x-ray 12/29 reveals ET tube placement, similar right-sided infiltrates and atelectasis, new rounded left-sided infiltrate on my review interpretation.  He developed shock now on maximum dose norepinephrine , vasopressin , started on phenylephrine  overnight.  He remains intubated.  Minimal vent settings.  He is on Precedex  for sedation.  Weaning Precedex .  Blood pressure get a bit better.  Review of labs reveal serial low hemoglobins.  He is getting a transfusion today.  He has had transfusions earlier this admission.  Pertinent Medical History:    has a past medical history of BPH (benign prostatic hyperplasia), Cardiomegaly, Dilated aortic root, Dysrhythmia, Enlarged prostate without lower urinary tract symptoms (luts), GERD (gastroesophageal reflux disease), History of hiatal hernia, Hyperlipidemia, Hypertension, Hypothyroidism, Insomnia, Junctional bradycardia, OA (osteoarthritis), Obesity, Paroxysmal A-fib (HCC), Peripheral neuropathy (07/17/2017), and Vitamin D  deficiency.  Significant Hospital  Events: Including procedures, antibiotic start and stop dates in addition to other pertinent events   12/25 admitted after fall 12/29 OR for femur fracture repair  12/30 PCCM consult, AFIB RVR overnight 1/5 RIJ Trialysis, CRRT started 1/9 CRRT stopped 1/10, Lasix  trial no improvement, unchanged  Interim History / Subjective:  No improvement in mental status.  No renal recovery but remains on multiple pressors.  Discussed with family at bedside, actively dying. They elect for comfort measures.   Objective:   Blood pressure 108/62, pulse 99, temperature (!) 100.6 F (38.1 C), temperature source Axillary, resp. rate (!) 21, height 5' 11 (1.803 m), weight 101.1 kg, SpO2 98%.    Vent Mode: PRVC FiO2 (%):  [40 %] 40 % Set Rate:  [18 bmp] 18 bmp Vt Set:  [560 mL] 560 mL PEEP:  [8 cmH20] 8 cmH20 Pressure Support:  [8 cmH20] 8 cmH20 Plateau Pressure:  [16 cmH20-22 cmH20] 21 cmH20   Intake/Output Summary (Last 24 hours) at 05/20/2024 1003 Last data filed at May 20, 2024 0800 Gross per 24 hour  Intake 2807.36 ml  Output 2150 ml  Net 657.36 ml   Filed Weights   05/03/24 0500 05/04/24 0500 05-20-2024 0406  Weight: 102.3 kg 102.4 kg 101.1 kg   Physical Examination:  General: In bed, ill-appearing Neuro: comatose HEENT: ETT Cardiovascular: Irregular Lungs: PS non labored Abdomen: ND     sodium chloride      amiodarone  60 mg/hr (20-May-2024 0800)   dexmedetomidine  (PRECEDEX ) IV infusion Stopped (05/01/24 0732)   feeding supplement (PIVOT 1.5 CAL) Stopped (20-May-2024 0554)   fentaNYL  infusion INTRAVENOUS 200 mcg/hr (05-20-2024 0838)   heparin  1,500 Units/hr (2024/05/20 0800)   meropenem  (MERREM ) IV Stopped (05/04/24 2224)   norepinephrine  (LEVOPHED ) Adult infusion 18 mcg/min (01-29-2025 0800)   prismasol  BGK 4/2.5 500 mL/hr at 05/02/24 0620   prismasol  BGK 4/2.5 2,000 mL/hr at  05/02/24 0710   prismasol  BGK 4/2.5 400 mL/hr at 05/02/24 0514   vasopressin  0.03 Units/min (2024/05/15 0800)    Patient Lines/Drains/Airways Status     Active Line/Drains/Airways     Name Placement date Placement time Site Days   PICC Triple Lumen 04/17/24 Right Brachial 40 cm 0 cm 04/17/24  2320  -- 18   Hemodialysis Catheter Right Internal jugular Triple lumen Temporary (Non-Tunneled) 04/28/24  1000  Internal jugular  7   Negative Pressure Wound Therapy Knee Anterior;Left 02/18/21  1708  --  1172   Fecal Management System 45 mL 04/30/24  1205  -- 5   Small Bore Feeding Tube 10 Fr. Left nare Marking at nare/corner of mouth 61 cm 04/18/24  1610  Left nare  17   Wound 04/21/24 1333 Surgical Closed Surgical Incision Hip Right 04/21/24  1333  Hip  14           Assessment and Plan:    Shock: Presumably related to fluid removal, hypovolemia although at risk of infection -- Wean pressors once extubated for comfort   Metabolic versus toxic encephalopathy: Head imaging unchanged.   Acute hypoxemic respiratory failure: Preceding intubation, remains intubated after procedure 12/29.   --opiate gtt, ativan  PRN, palliative extubation   Renal Failure: No improvement despite aggressive supportive care vasopressors and CRRT. -- Comfort measures  I spent 55 minutes in the care of the patient including family meeting, discussion with primary team, coordination of care.     Donnice JONELLE Beals, MD  See Tracey for personal pager PCCM on call pager [phone] until 7pm. Please call Elink 7p-7a. [phone]  May 15, 2024 10:03 AM     "

## 2024-05-25 DEATH — deceased
